# Patient Record
Sex: Female | Born: 1955 | Race: White | Hispanic: No | Smoking: Never smoker
Health system: Southern US, Community
[De-identification: ages and names within clinical notes are randomized; demographics above are authoritative.]

## PROBLEM LIST (undated history)

## (undated) DIAGNOSIS — R51 Headache: Secondary | ICD-10-CM

## (undated) DIAGNOSIS — Z780 Asymptomatic menopausal state: Secondary | ICD-10-CM

## (undated) DIAGNOSIS — Q2111 Secundum atrial septal defect: Secondary | ICD-10-CM

## (undated) DIAGNOSIS — E785 Hyperlipidemia, unspecified: Secondary | ICD-10-CM

## (undated) DIAGNOSIS — K219 Gastro-esophageal reflux disease without esophagitis: Secondary | ICD-10-CM

## (undated) DIAGNOSIS — F0781 Postconcussional syndrome: Secondary | ICD-10-CM

## (undated) DIAGNOSIS — Z8719 Personal history of other diseases of the digestive system: Secondary | ICD-10-CM

## (undated) DIAGNOSIS — E669 Obesity, unspecified: Secondary | ICD-10-CM

## (undated) DIAGNOSIS — N809 Endometriosis, unspecified: Secondary | ICD-10-CM

## (undated) DIAGNOSIS — I219 Acute myocardial infarction, unspecified: Secondary | ICD-10-CM

## (undated) DIAGNOSIS — M1990 Unspecified osteoarthritis, unspecified site: Secondary | ICD-10-CM

## (undated) DIAGNOSIS — Z8041 Family history of malignant neoplasm of ovary: Secondary | ICD-10-CM

## (undated) DIAGNOSIS — I82409 Acute embolism and thrombosis of unspecified deep veins of unspecified lower extremity: Secondary | ICD-10-CM

## (undated) DIAGNOSIS — K589 Irritable bowel syndrome without diarrhea: Secondary | ICD-10-CM

## (undated) DIAGNOSIS — M8580 Other specified disorders of bone density and structure, unspecified site: Secondary | ICD-10-CM

## (undated) DIAGNOSIS — F4310 Post-traumatic stress disorder, unspecified: Secondary | ICD-10-CM

## (undated) DIAGNOSIS — R7303 Prediabetes: Secondary | ICD-10-CM

## (undated) DIAGNOSIS — I519 Heart disease, unspecified: Secondary | ICD-10-CM

## (undated) DIAGNOSIS — I700 Atherosclerosis of aorta: Secondary | ICD-10-CM

## (undated) DIAGNOSIS — R519 Headache, unspecified: Secondary | ICD-10-CM

## (undated) DIAGNOSIS — N62 Hypertrophy of breast: Secondary | ICD-10-CM

## (undated) DIAGNOSIS — T8859XA Other complications of anesthesia, initial encounter: Secondary | ICD-10-CM

## (undated) DIAGNOSIS — Q2112 Patent foramen ovale: Secondary | ICD-10-CM

## (undated) DIAGNOSIS — G40409 Other generalized epilepsy and epileptic syndromes, not intractable, without status epilepticus: Secondary | ICD-10-CM

## (undated) DIAGNOSIS — Z800 Family history of malignant neoplasm of digestive organs: Secondary | ICD-10-CM

## (undated) DIAGNOSIS — G510 Bell's palsy: Secondary | ICD-10-CM

## (undated) DIAGNOSIS — G5711 Meralgia paresthetica, right lower limb: Secondary | ICD-10-CM

## (undated) DIAGNOSIS — R0600 Dyspnea, unspecified: Secondary | ICD-10-CM

## (undated) DIAGNOSIS — Z7952 Long term (current) use of systemic steroids: Secondary | ICD-10-CM

## (undated) DIAGNOSIS — M542 Cervicalgia: Secondary | ICD-10-CM

## (undated) DIAGNOSIS — Z9889 Other specified postprocedural states: Secondary | ICD-10-CM

## (undated) DIAGNOSIS — R112 Nausea with vomiting, unspecified: Secondary | ICD-10-CM

## (undated) DIAGNOSIS — D219 Benign neoplasm of connective and other soft tissue, unspecified: Secondary | ICD-10-CM

## (undated) DIAGNOSIS — I4719 Other supraventricular tachycardia: Secondary | ICD-10-CM

## (undated) DIAGNOSIS — M353 Polymyalgia rheumatica: Secondary | ICD-10-CM

## (undated) DIAGNOSIS — N8003 Adenomyosis of the uterus: Secondary | ICD-10-CM

## (undated) DIAGNOSIS — G479 Sleep disorder, unspecified: Secondary | ICD-10-CM

## (undated) DIAGNOSIS — R42 Dizziness and giddiness: Secondary | ICD-10-CM

## (undated) DIAGNOSIS — Q211 Atrial septal defect: Secondary | ICD-10-CM

## (undated) DIAGNOSIS — I5189 Other ill-defined heart diseases: Secondary | ICD-10-CM

## (undated) DIAGNOSIS — K222 Esophageal obstruction: Secondary | ICD-10-CM

## (undated) DIAGNOSIS — M48061 Spinal stenosis, lumbar region without neurogenic claudication: Secondary | ICD-10-CM

## (undated) DIAGNOSIS — A6000 Herpesviral infection of urogenital system, unspecified: Secondary | ICD-10-CM

## (undated) DIAGNOSIS — I209 Angina pectoris, unspecified: Secondary | ICD-10-CM

## (undated) DIAGNOSIS — J45909 Unspecified asthma, uncomplicated: Secondary | ICD-10-CM

## (undated) DIAGNOSIS — K209 Esophagitis, unspecified without bleeding: Secondary | ICD-10-CM

## (undated) DIAGNOSIS — F419 Anxiety disorder, unspecified: Secondary | ICD-10-CM

## (undated) DIAGNOSIS — I10 Essential (primary) hypertension: Secondary | ICD-10-CM

## (undated) DIAGNOSIS — N800 Endometriosis of uterus: Secondary | ICD-10-CM

## (undated) DIAGNOSIS — E559 Vitamin D deficiency, unspecified: Secondary | ICD-10-CM

## (undated) DIAGNOSIS — R0789 Other chest pain: Secondary | ICD-10-CM

## (undated) DIAGNOSIS — F410 Panic disorder [episodic paroxysmal anxiety] without agoraphobia: Secondary | ICD-10-CM

## (undated) DIAGNOSIS — S069X9A Unspecified intracranial injury with loss of consciousness of unspecified duration, initial encounter: Secondary | ICD-10-CM

## (undated) DIAGNOSIS — B009 Herpesviral infection, unspecified: Secondary | ICD-10-CM

## (undated) DIAGNOSIS — H8319 Labyrinthine fistula, unspecified ear: Secondary | ICD-10-CM

## (undated) DIAGNOSIS — M545 Low back pain, unspecified: Secondary | ICD-10-CM

## (undated) DIAGNOSIS — Z8701 Personal history of pneumonia (recurrent): Secondary | ICD-10-CM

## (undated) DIAGNOSIS — Z1379 Encounter for other screening for genetic and chromosomal anomalies: Secondary | ICD-10-CM

## (undated) DIAGNOSIS — O2230 Deep phlebothrombosis in pregnancy, unspecified trimester: Secondary | ICD-10-CM

## (undated) DIAGNOSIS — Z803 Family history of malignant neoplasm of breast: Secondary | ICD-10-CM

## (undated) DIAGNOSIS — M109 Gout, unspecified: Secondary | ICD-10-CM

## (undated) DIAGNOSIS — I499 Cardiac arrhythmia, unspecified: Secondary | ICD-10-CM

## (undated) HISTORY — DX: Low back pain: M54.5

## (undated) HISTORY — DX: Cervicalgia: M54.2

## (undated) HISTORY — DX: Obesity, unspecified: E66.9

## (undated) HISTORY — DX: Postconcussional syndrome: F07.81

## (undated) HISTORY — DX: Hyperlipidemia, unspecified: E78.5

## (undated) HISTORY — DX: Endometriosis of uterus: N80.0

## (undated) HISTORY — DX: Secundum atrial septal defect: Q21.11

## (undated) HISTORY — DX: Family history of malignant neoplasm of digestive organs: Z80.0

## (undated) HISTORY — DX: Endometriosis, unspecified: N80.9

## (undated) HISTORY — DX: Vitamin D deficiency, unspecified: E55.9

## (undated) HISTORY — DX: Bell's palsy: G51.0

## (undated) HISTORY — DX: Heart disease, unspecified: I51.9

## (undated) HISTORY — PX: OTHER SURGICAL HISTORY: SHX169

## (undated) HISTORY — DX: Labyrinthine fistula, unspecified ear: H83.19

## (undated) HISTORY — PX: INNER EAR SURGERY: SHX679

## (undated) HISTORY — DX: Herpesviral infection, unspecified: B00.9

## (undated) HISTORY — DX: Spinal stenosis, lumbar region without neurogenic claudication: M48.061

## (undated) HISTORY — DX: Adenomyosis of the uterus: N80.03

## (undated) HISTORY — DX: Encounter for other screening for genetic and chromosomal anomalies: Z13.79

## (undated) HISTORY — PX: SINOSCOPY: SHX187

## (undated) HISTORY — DX: Low back pain, unspecified: M54.50

## (undated) HISTORY — DX: Family history of malignant neoplasm of breast: Z80.3

## (undated) HISTORY — DX: Hypertrophy of breast: N62

## (undated) HISTORY — DX: Other generalized epilepsy and epileptic syndromes, not intractable, without status epilepticus: G40.409

## (undated) HISTORY — DX: Esophageal obstruction: K22.2

## (undated) HISTORY — PX: DILATION AND CURETTAGE OF UTERUS: SHX78

## (undated) HISTORY — PX: NASAL SEPTUM SURGERY: SHX37

## (undated) HISTORY — DX: Dizziness and giddiness: R42

## (undated) HISTORY — DX: Patent foramen ovale: Q21.12

## (undated) HISTORY — DX: Gastro-esophageal reflux disease without esophagitis: K21.9

## (undated) HISTORY — DX: Family history of malignant neoplasm of ovary: Z80.41

## (undated) HISTORY — DX: Unspecified asthma, uncomplicated: J45.909

## (undated) HISTORY — DX: Personal history of pneumonia (recurrent): Z87.01

## (undated) HISTORY — DX: Other chest pain: R07.89

## (undated) HISTORY — PX: ESOPHAGOGASTRODUODENOSCOPY: SHX1529

## (undated) HISTORY — DX: Atrial septal defect: Q21.1

## (undated) HISTORY — DX: Other specified disorders of bone density and structure, unspecified site: M85.80

## (undated) HISTORY — DX: Acute myocardial infarction, unspecified: I21.9

## (undated) HISTORY — DX: Benign neoplasm of connective and other soft tissue, unspecified: D21.9

## (undated) HISTORY — DX: Asymptomatic menopausal state: Z78.0

## (undated) HISTORY — DX: Meralgia paresthetica, right lower limb: G57.11

## (undated) HISTORY — DX: Unspecified intracranial injury with loss of consciousness of unspecified duration, initial encounter: S06.9X9A

---
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
## 1992-06-07 DIAGNOSIS — R42 Dizziness and giddiness: Secondary | ICD-10-CM

## 1992-06-07 DIAGNOSIS — S069X9A Unspecified intracranial injury with loss of consciousness of unspecified duration, initial encounter: Secondary | ICD-10-CM

## 1992-06-07 DIAGNOSIS — S069XAA Unspecified intracranial injury with loss of consciousness status unknown, initial encounter: Secondary | ICD-10-CM

## 1992-06-07 DIAGNOSIS — F0781 Postconcussional syndrome: Secondary | ICD-10-CM

## 1992-06-07 DIAGNOSIS — H8319 Labyrinthine fistula, unspecified ear: Secondary | ICD-10-CM

## 1992-06-07 HISTORY — DX: Unspecified intracranial injury with loss of consciousness of unspecified duration, initial encounter: S06.9X9A

## 1992-06-07 HISTORY — DX: Labyrinthine fistula, unspecified ear: H83.19

## 1992-06-07 HISTORY — DX: Postconcussional syndrome: R42

## 1992-06-07 HISTORY — DX: Postconcussional syndrome: F07.81

## 1992-06-07 HISTORY — DX: Unspecified intracranial injury with loss of consciousness status unknown, initial encounter: S06.9XAA

## 1998-06-07 DIAGNOSIS — Z1379 Encounter for other screening for genetic and chromosomal anomalies: Secondary | ICD-10-CM

## 1998-06-07 HISTORY — DX: Encounter for other screening for genetic and chromosomal anomalies: Z13.79

## 2000-06-07 DIAGNOSIS — G40409 Other generalized epilepsy and epileptic syndromes, not intractable, without status epilepticus: Secondary | ICD-10-CM

## 2000-06-07 HISTORY — DX: Other generalized epilepsy and epileptic syndromes, not intractable, without status epilepticus: G40.409

## 2006-08-06 HISTORY — PX: OTHER SURGICAL HISTORY: SHX169

## 2006-09-06 HISTORY — PX: PATENT FORAMEN OVALE CLOSURE: SHX2181

## 2007-06-08 HISTORY — PX: BREAST BIOPSY: SHX20

## 2007-10-12 ENCOUNTER — Encounter: Payer: Self-pay | Admitting: Cardiovascular Disease

## 2007-10-18 ENCOUNTER — Encounter: Payer: Self-pay | Admitting: Cardiovascular Disease

## 2008-04-15 ENCOUNTER — Encounter: Admission: RE | Admit: 2008-04-15 | Discharge: 2008-04-15 | Payer: Self-pay | Admitting: General Surgery

## 2008-07-23 ENCOUNTER — Encounter: Payer: Self-pay | Admitting: Cardiovascular Disease

## 2008-08-19 ENCOUNTER — Ambulatory Visit: Payer: Self-pay | Admitting: Internal Medicine

## 2008-09-30 ENCOUNTER — Encounter: Payer: Self-pay | Admitting: Cardiovascular Disease

## 2008-10-07 ENCOUNTER — Ambulatory Visit: Payer: Self-pay | Admitting: Surgery

## 2008-12-05 ENCOUNTER — Ambulatory Visit: Payer: Self-pay | Admitting: Internal Medicine

## 2008-12-24 ENCOUNTER — Ambulatory Visit: Payer: Self-pay | Admitting: Obstetrics and Gynecology

## 2009-02-26 ENCOUNTER — Ambulatory Visit: Payer: Self-pay | Admitting: Internal Medicine

## 2009-04-30 ENCOUNTER — Ambulatory Visit: Payer: Self-pay | Admitting: Gastroenterology

## 2009-05-07 ENCOUNTER — Ambulatory Visit: Payer: Self-pay | Admitting: Surgery

## 2009-05-29 ENCOUNTER — Ambulatory Visit: Payer: Self-pay | Admitting: Internal Medicine

## 2009-06-10 ENCOUNTER — Encounter: Payer: Self-pay | Admitting: Cardiovascular Disease

## 2009-06-11 ENCOUNTER — Encounter: Payer: Self-pay | Admitting: Cardiovascular Disease

## 2009-07-24 ENCOUNTER — Ambulatory Visit: Payer: Self-pay | Admitting: Internal Medicine

## 2009-07-27 ENCOUNTER — Encounter: Payer: Self-pay | Admitting: Cardiovascular Disease

## 2009-08-18 ENCOUNTER — Encounter: Payer: Self-pay | Admitting: Cardiovascular Disease

## 2009-11-05 ENCOUNTER — Ambulatory Visit: Payer: Self-pay | Admitting: Cardiovascular Disease

## 2009-11-05 DIAGNOSIS — E785 Hyperlipidemia, unspecified: Secondary | ICD-10-CM | POA: Insufficient documentation

## 2009-11-05 DIAGNOSIS — R079 Chest pain, unspecified: Secondary | ICD-10-CM | POA: Insufficient documentation

## 2009-11-05 DIAGNOSIS — I471 Supraventricular tachycardia: Secondary | ICD-10-CM | POA: Insufficient documentation

## 2009-11-05 DIAGNOSIS — Q211 Atrial septal defect: Secondary | ICD-10-CM | POA: Insufficient documentation

## 2009-11-11 ENCOUNTER — Telehealth: Payer: Self-pay | Admitting: Cardiovascular Disease

## 2010-02-16 ENCOUNTER — Observation Stay: Payer: Self-pay | Admitting: Internal Medicine

## 2010-02-16 ENCOUNTER — Ambulatory Visit: Payer: Self-pay | Admitting: Cardiovascular Disease

## 2010-02-25 ENCOUNTER — Ambulatory Visit: Payer: Self-pay | Admitting: Internal Medicine

## 2010-04-03 ENCOUNTER — Telehealth: Payer: Self-pay | Admitting: Cardiovascular Disease

## 2010-07-07 NOTE — Miscellaneous (Signed)
Summary: zocor  Clinical Lists Changes  Medications: Added new medication of ZOCOR 40 MG TABS (SIMVASTATIN) 1 tab by mouth at bedtime - Signed Rx of ZOCOR 40 MG TABS (SIMVASTATIN) 1 tab by mouth at bedtime;  #30 x 11;  Signed;  Entered by: Charlena Cross, RN, BSN;  Authorized by: Dossie Arbour MD;  Method used: [facility] [place], [place]  [address], Ph: [phone] or [phone], Fax: [phone]    Prescriptions: ZOCOR 40 MG TABS (SIMVASTATIN) 1 tab by mouth at bedtime  #30 x 11   Entered by:   Charlena Cross, RN, BSN   Authorized by:   Dossie Arbour MD   Signed by:   Charlena Cross, RN, BSN on 08/18/2009   Method used:   [facility]        [facility] (retail)       [address]       [place], [place]  [address]       Ph: [phone] or [phone]       Fax: [phone]   RxID:   [id number]

## 2010-07-07 NOTE — Assessment & Plan Note (Signed)
Summary: NP6   Visit Type:  Follow-up Primary Provider:  Duncan Dull, M.D.  CC:  shortness of breath.  History of Present Illness: Tracey Wiley is a pleasant 55 -year-old woman with a history of PFO closure with implant device in April 2008 performed in [place], hyperlipidemia, PAT versus paroxysmal atrial fibrillation on sotalol, hypertension who presents to establish care. She was last seen at [facility] January 2011  She states that she has been having continued episodes of tachypalpitations particularly in the evenings. She also has burning which he describes as a region the size of her face that burns underneath her left breast and typically occurs at nighttime. She has chronic pain from her neck that occurs both in the morning and at nighttime. She has to sleep on her left side do to her chronic neck pain.  Previously we tried to decrease her sotalol to 80 mg in the morning and 80 mg at night due to bradycardia during the daytime. Decreasing her evening dose she believes may have caused more tachycardia at nighttime.  Preventive Screening-Counseling & Management  Alcohol-Tobacco     Smoking Status: never      Drug Use:  no.    Current Medications (verified): 1)  Zocor 40 Mg Tabs (Simvastatin) .Marland Kitchen.. 1 Tab By Mouth At Bedtime 2)  Aciphex 20 Mg Tbec (Rabeprazole Sodium) .Marland Kitchen.. 1 Once Daily 3)  Xanax 0.5 Mg Tabs (Alprazolam) .Marland Kitchen.. 1 Tablet Qhs 4)  Sotalol Hcl 80 Mg Tabs (Sotalol Hcl) .... Take 1 Tablet By Mouth Two Times A Day 5)  Triamterene-Hctz 37.5-25 Mg Caps (Triamterene-Hctz) .Marland Kitchen.. 1 Tablet Q D 6)  Acetaminophen 500 Mg  Caps (Acetaminophen) 7)  Advil 200 Mg Tabs (Ibuprofen) .... As Needed 8)  Excedrin Pm 500-38 Mg Tabs (Diphenhydramine-Apap (Sleep))  Allergies (verified): 1)  ! Codeine 2)  ! Biaxin 3)  ! Morphine 4)  ! Vicodin 5)  ! Macrobid  Past History:  Family History: Last updated: 11/05/2009 CHF Family History of Cancer:  Family History  of Coronary Artery Disease:  Family History of Diabetes:  Family History of Hyperlipidemia:  Family History of Hypertension:   Social History: Last updated: 11/05/2009 Disabled  Married  Tobacco Use - No.  Alcohol Use - no Drug Use - no  Risk Factors: Smoking Status: never (11/05/2009)  Past Surgical History: TMJ x 2 PFO closure inner ear surg. x 4 Nasal Deviated Septum  Family History: CHF Family History of Cancer:  Family History of Coronary Artery Disease:  Family History of Diabetes:  Family History of Hyperlipidemia:  Family History of Hypertension:   Social History: Disabled  Married  Tobacco Use - No.  Alcohol Use - no Drug Use - no Smoking Status:  never Drug Use:  no  Review of Systems       The patient complains of weight gain.  The patient denies fever, weight loss, vision loss, decreased hearing, hoarseness, chest pain, syncope, dyspnea on exertion, peripheral edema, prolonged cough, abdominal pain, incontinence, muscle weakness, depression, and enlarged lymph nodes.         Tachycardia, neck pain, chest burning  New Orders:     1)  EKG w/ Interpretation (93000)   Vital Signs:  Patient profile:   55 year old female Height:      65 inches Weight:      206 pounds BMI:     34.40 BP sitting:   153 / 97  (right arm) Cuff size:   regular  Vitals  Entered By: Bishop Dublin, CMA (November 05, 2009 3:11 PM)  Physical Exam  General:  Well developed, well nourished, in no acute distress. Head:  normocephalic and atraumatic Neck:  Neck supple, no JVD. No masses, thyromegaly or abnormal cervical nodes. Lungs:  Clear bilaterally to auscultation and percussion. Heart:  Non-displaced PMI, chest non-tender; regular rate and rhythm, S1, S2 without murmurs, rubs or gallops. Carotid upstroke normal, no bruit.  Pedals normal pulses. No edema, no varicosities. Abdomen:  Bowel sounds positive; abdomen soft and non-tender without masses, organomegaly, or hernias  noted. No hepatosplenomegaly. Msk:  Back normal, normal gait. Muscle strength and tone normal. Pulses:  pulses normal in all 4 extremities Extremities:  No clubbing or cyanosis. Neurologic:  Alert and oriented x 3. Skin:  Intact without lesions or rashes. Psych:  Normal affect.   EKG  Procedure date:  11/05/2009  Findings:      sinus bradycardia with rate 57 beats per minute, borderline T waves in the 2 through V4 otherwise poor progression through the precordial leads.  Impression & Recommendations:  Problem # 1:  SVT/ PSVT/ PAT (ICD-427.0) history of PAT, possible paroxysmal atrial  fibrillation. Workup completed in [place]. She has been managed on sotalol. We have recommended that she go back to 120 mg in the evening with a lower dose and he morning as she has few symptoms of palpitations and daytime.  Her updated medication list for this problem includes:    Sotalol Hcl 80 Mg Tabs (Sotalol hcl) .Marland Kitchen... Take 1/2 tab at 9 am and 1 1/2 tab at 9 pm and 1/2 tab as needed  Problem # 2:  CHEST PAIN-UNSPECIFIED (ICD-786.50) Etiology of her chest burning is uncertain. It may be due to reflux. She did have an EGD showing gastritis with hiatal hernia. It is very atypical for cardiac has a cut on a nighttime at rest and is not reproducible.  Her updated medication list for this problem includes:    Sotalol Hcl 80 Mg Tabs (Sotalol hcl) .Marland Kitchen... Take 1/2 tab at 9 am and 1 1/2 tab at 9 pm and 1/2 tab as needed  Problem # 3:  HYPERLIPIDEMIA-MIXED (ICD-272.4) Her most recent lab work from January 2011 shows total cholesterol 172, LDL 90, HDL 49, hemoglobin A1c 6.0, TSH 2.44 with normal LFTs  Her updated medication list for this problem includes:    Zocor 40 Mg Tabs (Simvastatin) .Marland Kitchen... 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 No repeat echocardiogram indicated at this time. If she continues to have symptoms of tach palpitations, we  could perform an echocardiogram to confirm the location of her PFO closure device.  Other Orders: EKG w/ Interpretation (93000)  Patient Instructions: 1)  Your physician has recommended you make the following change in your medication: 1. Sotalol 40mg  at 9 am and 120mg  at 9pm and 40mg  as needed.  2)  Your physician wants you to follow-up in:   6 MONTHS You will receive a reminder letter in the mail two months in advance. If you don't receive a letter, please call our office to schedule the follow-up appointment.

## 2010-07-07 NOTE — Letter (Signed)
Summary: PHI  PHI   Imported By: Harlon Flor 11/06/2009 10:17:24  _____________________________________________________________________  External Attachment:    Type:   Image     Comment:   External Document

## 2010-07-07 NOTE — Letter (Signed)
Summary: [facility]  [facility]   Imported By: Harlon Flor 11/11/2009 15:22:56  _____________________________________________________________________  External Attachment:    Type:   Image     Comment:   External Document

## 2010-07-07 NOTE — Progress Notes (Signed)
Summary: LAB  Phone Note Call from Patient Call back at Home Phone [phone]   Caller: SELF Call For: Tracey Wiley Summary of Call: WAITING TO HEAR BACK ABOUT CHOLESTEROL LABS THAT DR Darrick Huntsman DREW-WAS TOLD BY Mariah Milling THAT THEY WOULD HEAR FROM OUR OFFICE Initial call taken by: Harlon Flor,  November 11, 2009 3:16 PM  Follow-up for Phone Call        called pt informed them that the results were not available yet but i would contact Dr. Melina Schools office again.  Follow-up by: Benedict Needy, RN,  November 11, 2009 4:42 PM

## 2010-07-07 NOTE — Progress Notes (Signed)
Summary: [facility]  Phone Note Call from Patient Call back at Home Phone [phone]   Caller: HUSBAND Call For: [facility] Summary of Call: PT NEEDS PRE MED ANTIBIOTICS FOR DENTIST APPT [facility]06/[facility] Initial call taken by: Harlon Flor,  April 03, 2010 9:55 AM  Follow-up for Phone Call        OK to give amoxicillin 2000 mg by mouth 1 hr before procedure     Appended Document: [facility]    Clinical Lists Changes  Medications: Added new medication of AMOXICILLIN 500 MG CAPS (AMOXICILLIN) take four tablets one hour prior to dental work - Signed Rx of AMOXICILLIN 500 MG CAPS (AMOXICILLIN) take four tablets one hour prior to dental work;  #4 x 0;  Signed;  Entered by: Bishop Dublin, CMA;  Authorized by: Dossie Arbour MD;  Method used: [facility]  [address], [place], [place]  [address], Ph: [phone] or [phone], Fax: [phone]    Prescriptions: AMOXICILLIN 500 MG CAPS (AMOXICILLIN) take four tablets one hour prior to dental work  #4 x 0   Entered by:   Bishop Dublin, CMA   Authorized by:   Dossie Arbour MD   Signed by:   Bishop Dublin, CMA on 04/03/2010   Method used:   [facility]ronically [facility]        [facility]  [address] (retail)       [address]       [place], [place]  [address]       Ph: [phone] or [phone]       Fax: [phone]   RxID:   [id number]    Appended Document: [facility] notified patient's husband [facility] [place].

## 2010-07-07 NOTE — Letter (Signed)
Summary: Medical Record Release  Medical Record Release   Imported By: Harlon Flor 10/23/2009 11:51:19  _____________________________________________________________________  External Attachment:    Type:   Image     Comment:   External Document

## 2010-07-07 NOTE — Letter (Signed)
Summary: [facility]  [facility]   Imported By: Harlon Flor 11/11/2009 15:22:18  _____________________________________________________________________  External Attachment:    Type:   Image     Comment:   External Document

## 2010-07-07 NOTE — Letter (Signed)
Summary: [facility]  [facility]   Imported By: Harlon Flor 11/06/2009 10:17:01  _____________________________________________________________________  External Attachment:    Type:   Image     Comment:   External Document

## 2010-07-30 ENCOUNTER — Ambulatory Visit (INDEPENDENT_AMBULATORY_CARE_PROVIDER_SITE_OTHER): Payer: BC Managed Care – PPO | Admitting: Cardiovascular Disease

## 2010-07-30 ENCOUNTER — Encounter: Payer: Self-pay | Admitting: Cardiovascular Disease

## 2010-07-30 VITALS — BP 100/64 | HR 60 | Resp 16 | Ht 65.0 in | Wt 191.0 lb

## 2010-07-30 DIAGNOSIS — I4719 Other supraventricular tachycardia: Secondary | ICD-10-CM

## 2010-07-30 DIAGNOSIS — I471 Supraventricular tachycardia, unspecified: Secondary | ICD-10-CM

## 2010-07-30 DIAGNOSIS — Q2111 Secundum atrial septal defect: Secondary | ICD-10-CM

## 2010-07-30 DIAGNOSIS — I4891 Unspecified atrial fibrillation: Secondary | ICD-10-CM

## 2010-07-30 DIAGNOSIS — Q2112 Patent foramen ovale: Secondary | ICD-10-CM

## 2010-07-30 DIAGNOSIS — R000 Tachycardia, unspecified: Secondary | ICD-10-CM

## 2010-07-30 DIAGNOSIS — E785 Hyperlipidemia, unspecified: Secondary | ICD-10-CM

## 2010-07-30 DIAGNOSIS — Q211 Atrial septal defect: Secondary | ICD-10-CM

## 2010-07-30 DIAGNOSIS — R002 Palpitations: Secondary | ICD-10-CM

## 2010-08-01 ENCOUNTER — Encounter: Payer: Self-pay | Admitting: Cardiovascular Disease

## 2010-08-01 NOTE — Assessment & Plan Note (Signed)
very occasional episodes of tachypalpitations in the late afternoon.  I suggested she continue her full dose sotalol at noon and midnight with a 40 mg tab for breakthrough tachypalpitations.

## 2010-08-01 NOTE — Assessment & Plan Note (Signed)
PFO closure placed 4 years ago and has been doing well. Echocardiogram in 2009 showed negative saline contrast bubble study.

## 2010-08-01 NOTE — Assessment & Plan Note (Signed)
continue Zocor. Cholesterol last year showed LDL of 90. This will likely improve with her recent weight loss. We have encouraged continued exercise and diet control as she is doing.

## 2010-08-01 NOTE — Progress Notes (Signed)
Subjective:      Patient ID: Tracey Wiley is a 55 y.o. female.  Chief Complaint: HPI Comments: Tracey Wiley has a history of PFO with  closure  device in April 2008 performed in [place], hyperlipidemia, PAT versus paroxysmal atrial fibrillation on sotalol, hypertension who presents for routine followup.  she is recovering from an upper respiratory infection, possibly pneumonia. She is taking Augmentin and is feeling better. She continues to have significant cough. She has been exercising on a regular basis with a 15 pound weight loss over the past 7 months. Her husband has had a 25 pound weight loss. She has also been watching her diet and feels well. She does have some tachycardia palpitations at nighttime and is concerned about atrial fibrillation. She does better on sotalol 80 mg in the late morning and at midnight.  EKG shows normal sinus rhythm with rate 60 beats per minute, no significant ST or T wave changes  .  Allergies  Allergen Reactions  . Clarithromycin   . Codeine   . Hydrocodone-Acetaminophen   . Morphine   . Nitrofurantoin     Prior to Admission medications   Medication Sig Start Date End Date Taking? Authorizing Provider  ALPRAZolam Prudy Feeler) 0.5 MG tablet Take 0.5 mg by mouth at bedtime as needed.     Yes Historical Provider, MD  aspirin-acetaminophen-caffeine (EXCEDRIN MIGRAINE) [phone] MG per tablet Take 1 tablet by mouth every 6 (six) hours as needed.     Yes Historical Provider, MD  RABEprazole (ACIPHEX) 20 MG tablet Take 20 mg by mouth daily.     Yes Historical Provider, MD  simvastatin (ZOCOR) 40 MG tablet Take 40 mg by mouth at bedtime.     Yes Historical Provider, MD  SOTALOL AF 80 MG TABS Take 80 mg by mouth 2 (two) times daily.     Yes Historical Provider, MD  triamterene-hydrochlorothiazide (DYAZIDE) 37.5-25 MG per capsule Take 1 capsule by mouth every morning.     Yes Historical Provider, MD    Past Medical History  Diagnosis Date  . PFO (patent  foramen ovale)   . Hyperlipidemia   . Obesity   . Chest pain, atypical     egd showing gastritis and hiatal hernia    Past Surgical History  Procedure Date  . Patent foramen ovale closure April 2008    Social History  reports that she has never smoked. She does not have any smokeless tobacco history on file.  Family History family history is not on file.     Review of Systems  Constitution: Negative for night sweats.  HENT: Negative.   Eyes: Negative.   Cardiovascular: Negative for claudication, cyanosis, dyspnea on exertion, leg swelling, orthopnea and paroxysmal nocturnal dyspnia.  Respiratory: Positive for sputum production. Negative for sleep disturbances due to breathing and wheezing.   Endocrine: Negative.   Hematologic/Lymphatic: Negative.   Skin: Negative.   Musculoskeletal: Negative for falls, joint pain, joint swelling and myalgias.  Gastrointestinal: Negative.   Neurological: Negative for difficulty with concentration, excessive daytime sleepiness, focal weakness and light-headedness.  Psychiatric/Behavioral: Negative.    BP 100/64  Pulse 60  Resp 16  Ht 5\' 5"  (1.651 m)  Wt 191 lb (86.637 kg)  BMI 31.78 kg/m2   Objective:    Physical Exam  Constitutional: She appears healthy. No distress.  HENT:  Nose: Nose normal.  Mouth/Throat: Oropharynx is clear.  Eyes: Conjunctivae are normal. Pupils are equal, round, and reactive to light.  Neck: Normal range of  motion. Neck supple. No JVD present.  Cardiovascular: Normal rate, regular rhythm, S1 normal, S2 normal, normal heart sounds, intact distal pulses and normal pulses.   No extrasystoles are present. Exam reveals no gallop.   No murmur heard. Pulses:      Carotid pulses are 2+ on the right side, and 2+ on the left side.      Radial pulses are 2+ on the right side, and 2+ on the left side.       Posterior tibial pulses are 2+ on the right side, and 2+ on the left side.  Pulmonary/Chest: Effort normal and  breath sounds normal. She has no wheezes. She has no rales. She exhibits no tenderness.  Abdominal: Soft. Bowel sounds are normal. She exhibits no distension. There is no tenderness.  Musculoskeletal: Normal range of motion. She exhibits no edema and no tenderness.  Neurological: She is alert and oriented to person, place, and time. She has normal motor skills. Gait normal.  Skin: Skin is warm and dry. No rash noted.    Assessment:     No diagnosis found.   Plan:

## 2010-08-04 NOTE — Assessment & Plan Note (Signed)
Summary: ROV/# [id number]   Visit Type:  Follow-up Primary Provider:  Duncan Dull, M.D.  CC:  c/o just getting over puemonia.  She has palpitations at times with occas. shortness of breath..  History of Present Illness: Tracey Wiley is a pleasant 55 -year-old woman with a history of PFO closure with implant device in April 2008 performed in [place], hyperlipidemia, PAT versus paroxysmal atrial fibrillation on sotalol, hypertension who presents for routine followup.  she is recovering from an upper respiratory infection, possibly pneumonia. She is taking Augmentin and is feeling better. She continues to have significant cough. She has been exercising on a regular basis with a 15 pound weight loss over the past 7 months. Her husband has had a 25 pound weight loss. She has also been watching her diet and feels well. She does have some tachycardia palpitations at nighttime and is concerned about atrial fibrillation. She does better on sotalol 80 mg in the late morning and at midnight.  EKG shows normal sinus rhythm with rate 60 beats per minute, no significant ST or T wave changes  Current Medications (verified): 1)  Zocor 40 Mg Tabs (Simvastatin) .Marland Kitchen.. 1 Tab By Mouth At Bedtime 2)  Aciphex 20 Mg Tbec (Rabeprazole Sodium) .Marland Kitchen.. 1 Once Daily 3)  Xanax 0.5 Mg Tabs (Alprazolam) .Marland Kitchen.. 1 Tablet Qhs 4)  Sotalol Hcl 80 Mg Tabs (Sotalol Hcl) .... Take 1/2 Tab At 29 Am and 1 1/2 Tab At 9 Pm and 1/2 Tab As Needed 5)  Triamterene-Hctz 37.5-25 Mg Caps (Triamterene-Hctz) .Marland Kitchen.. 1 Tablet Q D 6)  Acetaminophen 500 Mg  Caps (Acetaminophen) 7)  Advil 200 Mg Tabs (Ibuprofen) .... As Needed 8)  Excedrin Pm 500-38 Mg Tabs (Diphenhydramine-Apap (Sleep)) 9)  Amoxicillin 500 Mg Caps (Amoxicillin) .... Take Four Tablets One Hour Prior To Dental Work 10)  Tessalon Perles 100 Mg Caps (Benzonatate) .... Takes 3 Daily For Cough 11)  Augmentin 500-125 Mg Tabs (Amoxicillin-Pot Clavulanate) .... Take Two Tabs Daily For  Seven Days For Pneumonia.  Allergies (verified): 1)  ! Codeine 2)  ! Biaxin 3)  ! Morphine 4)  ! Vicodin 5)  ! Macrobid  Review of Systems  The patient denies fever, weight loss, weight gain, vision loss, decreased hearing, hoarseness, chest pain, syncope, dyspnea on exertion, peripheral edema, prolonged cough, abdominal pain, incontinence, muscle weakness, depression, and enlarged lymph nodes.         cough, sputum  Vital Signs:  Patient profile:   55 year old female Height:      65 inches Weight:      191 pounds Pulse rate:   60 / minute BP sitting:   100 / 64  Vitals Entered By: Bishop Dublin, CMA (July 30, 2010 3:26 PM)  Physical Exam  General:  Well developed, well nourished, in no acute distress. mild obesity Head:  normocephalic and atraumatic Neck:  Neck supple, no JVD. No masses, thyromegaly or abnormal cervical nodes. Lungs:  Clear bilaterally to auscultation and percussion. Heart:  Non-displaced PMI, chest non-tender; regular rate and rhythm, S1, S2 without murmurs, rubs or gallops. Carotid upstroke normal, no bruit.  Pedals normal pulses. No edema, no varicosities. Abdomen:  Bowel sounds positive; abdomen soft and non-tender without masses Msk:  Back normal, normal gait. Muscle strength and tone normal. Pulses:  pulses normal in all 4 extremities Extremities:  No clubbing or cyanosis. Neurologic:  Alert and oriented x 3. Skin:  Intact without lesions or rashes. Psych:  Normal affect.   Impression &  Recommendations:  Problem # 1:  SVT/ PSVT/ PAT (ICD-427.0) very occasional episodes of tachypalpitations in the late afternoon. I suggested she continue her full dose sotalol at noon and midnight with a 40 mg tab for breakthrough tachypalpitations.  Her updated medication list for this problem includes:    Sotalol Hcl 80 Mg Tabs (Sotalol hcl) .Marland Kitchen... 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 Echocardiogram in 2009 showed negative saline contrast bubble study.  Problem # 3:  HYPERLIPIDEMIA-MIXED (ICD-272.4) continue Zocor. Cholesterol last year showed LDL of 90. This will likely improve with her recent weight loss. We have encouraged continued exercise and diet control as she is doing.  Her updated medication list for this problem includes:    Zocor 40 Mg Tabs (Simvastatin) .Marland Kitchen... 1 tab by mouth at bedtime  Orders: T-Lipid Profile ([id number]) T-Hepatic Function [phone])  Patient Instructions: 1)  Your physician recommends that you schedule a follow-up appointment in: 1 year. 2)  Your physician recommends that you continue on your current medications as directed. Please refer to the Current Medication list given to you today. 3)  Your physician recommends that you return for a FASTING lipid profile: [facility] order sheet given.

## 2010-08-26 ENCOUNTER — Ambulatory Visit: Payer: Self-pay | Admitting: Internal Medicine

## 2010-09-14 ENCOUNTER — Ambulatory Visit: Payer: Self-pay | Admitting: Unknown Physician Specialty

## 2010-09-18 ENCOUNTER — Other Ambulatory Visit: Payer: Self-pay | Admitting: Internal Medicine

## 2010-09-23 ENCOUNTER — Telehealth: Payer: Self-pay | Admitting: Emergency Medicine

## 2010-09-23 NOTE — Telephone Encounter (Signed)
Dr. Melina Schools office called wanting to know if patient is clear to start exercise program with a personal trainer from a cardiac standpoint.

## 2010-09-23 NOTE — Telephone Encounter (Signed)
Yes, ok to start exercise with trainer

## 2010-09-24 NOTE — Telephone Encounter (Signed)
Morrie Sheldon notified at [facility] office

## 2010-10-30 ENCOUNTER — Ambulatory Visit: Payer: Self-pay | Admitting: Internal Medicine

## 2010-11-25 ENCOUNTER — Ambulatory Visit: Payer: Self-pay | Admitting: Internal Medicine

## 2010-12-22 ENCOUNTER — Encounter: Payer: Self-pay | Admitting: Cardiovascular Disease

## 2011-01-11 ENCOUNTER — Telehealth: Payer: Self-pay

## 2011-01-11 MED ORDER — TRIAMTERENE-HCTZ 37.5-25 MG PO CAPS: 1.0000 | ORAL_CAPSULE | ORAL | Status: DC

## 2011-01-11 NOTE — Telephone Encounter (Signed)
Needs a refill on Triamterene-HCTZ 37.5-25mg  take one tablet daily.

## 2011-02-18 ENCOUNTER — Telehealth: Payer: Self-pay

## 2011-02-18 MED ORDER — SOTALOL HCL 80 MG PO TABS: ORAL_TABLET | ORAL | Status: DC

## 2011-02-18 NOTE — Telephone Encounter (Signed)
Refill sent for sotalol 80 mg tablet.

## 2011-02-22 ENCOUNTER — Other Ambulatory Visit: Payer: Self-pay | Admitting: Internal Medicine

## 2011-02-22 MED ORDER — VALACYCLOVIR HCL 1 G PO TABS: 2000.0000 mg | ORAL_TABLET | Freq: Three times a day (TID) | ORAL | Status: DC | PRN

## 2011-03-31 ENCOUNTER — Ambulatory Visit: Payer: BC Managed Care – PPO | Admitting: Cardiovascular Disease

## 2011-04-02 ENCOUNTER — Encounter: Payer: Self-pay | Admitting: Cardiovascular Disease

## 2011-04-02 ENCOUNTER — Ambulatory Visit (INDEPENDENT_AMBULATORY_CARE_PROVIDER_SITE_OTHER): Payer: BC Managed Care – PPO | Admitting: Cardiovascular Disease

## 2011-04-02 DIAGNOSIS — R079 Chest pain, unspecified: Secondary | ICD-10-CM

## 2011-04-02 DIAGNOSIS — R0789 Other chest pain: Secondary | ICD-10-CM

## 2011-04-02 DIAGNOSIS — R002 Palpitations: Secondary | ICD-10-CM

## 2011-04-02 DIAGNOSIS — R0602 Shortness of breath: Secondary | ICD-10-CM

## 2011-04-02 DIAGNOSIS — E785 Hyperlipidemia, unspecified: Secondary | ICD-10-CM

## 2011-04-02 DIAGNOSIS — I471 Supraventricular tachycardia: Secondary | ICD-10-CM

## 2011-04-02 NOTE — Assessment & Plan Note (Signed)
I suggested she stay on her simvastatin

## 2011-04-02 NOTE — Progress Notes (Signed)
Subjective:      Patient ID: Tracey Wiley is a 55 y.o. female.  Chief Complaint: HPI Comments: Tracey Wiley has a history of PFO with  closure  device in April 2008 performed in [place], hyperlipidemia, PAT versus paroxysmal atrial fibrillation on sotalol, hypertension who presents for routine followup.  She has been taking sotalol 80 mg b.i.d.. She does notice when she takes 80 mg at a time, she does have bradycardia and possible fatigue. She has numerous symptoms on today's visit including nausea, shortness of breath, chest heaviness, numbness in her fingers. She is very active, recently went biking for 8 miles, has gone bowling recently and does other activities with a Systems analyst. She was given tramadol for symptoms of concussion but has not tried this yet and is taking Advil. She thinks the Advil is causing her nausea. She hit her head in a pool and since then has not felt well.  She also reports having difficulty with her husband and is now somewhat separated and living in a separate house. He spent significant time talking about the dynamic. It would seem that he has some behavior issues including anger management issues. She reports that she is sad and disappointed by this but was not able to handle it very well as it has been persistent.  EKG shows normal sinus rhythm with rate 52 beats per minute, no significant ST or T wave changes  .  Outpatient Encounter Prescriptions as of 04/02/2011  Medication Sig Dispense Refill  . acetaminophen (TYLENOL) 500 MG tablet Take 500 mg by mouth daily.        Marland Kitchen ALPRAZolam (XANAX) 0.5 MG tablet Take 0.5 mg by mouth at bedtime as needed.        Marland Kitchen amoxicillin (AMOXIL) 500 MG capsule Take 2,000 mg by mouth. 1 hour prior to dental work       . aspirin-acetaminophen-caffeine (EXCEDRIN MIGRAINE) 250-250-65 MG per tablet Take 1 tablet by mouth every 6 (six) hours as needed.        . Diphenhydramine-APAP, sleep, (EXCEDRIN PM) 38-500 MG TABS Take by  mouth.        Marland Kitchen ibuprofen (ADVIL,MOTRIN) 200 MG tablet Take 200 mg by mouth as needed.        . RABEprazole (ACIPHEX) 20 MG tablet Take 20 mg by mouth daily.        . simvastatin (ZOCOR) 40 MG tablet Take 40 mg by mouth at bedtime.        . sotalol (BETAPACE) 80 MG tablet Take 1/2 tab at 9 a.m. And 1 and 1/2 tabs at 9 p.m. And 1/2 tab as needed  225 tablet  6  . SOTALOL AF 80 MG TABS Take 80 mg by mouth 2 (two) times daily.        Marland Kitchen triamterene-hydrochlorothiazide (DYAZIDE) 37.5-25 MG per capsule Take 1 capsule by mouth every morning.  90 capsule  3  . DISCONTD: amoxicillin-clavulanate (AUGMENTIN) 500-125 MG per tablet Take 2 tablets by mouth 2 (two) times daily. For 7 days for pneumonia       . DISCONTD: benzonatate (TESSALON) 100 MG capsule Take 100 mg by mouth 3 (three) times daily as needed.           Review of Systems  Unable to perform ROS Constitution: Negative for night sweats.  HENT: Negative.   Eyes: Negative.   Cardiovascular: Positive for dyspnea on exertion. Negative for claudication, cyanosis, leg swelling, orthopnea and paroxysmal nocturnal dyspnia.  Chest heaviness  Respiratory: Negative for sleep disturbances due to breathing, sputum production and wheezing.   Endocrine: Negative.   Hematologic/Lymphatic: Negative.   Skin: Negative.   Musculoskeletal: Negative for falls, joint pain, joint swelling and myalgias.  Gastrointestinal: Positive for nausea.  Neurological: Negative for difficulty with concentration, excessive daytime sleepiness, focal weakness and light-headedness.  Psychiatric/Behavioral: Negative.      Objective:    Physical Exam  Constitutional: She appears well-developed and well-nourished. She appears healthy. No distress.  HENT:  Head: Normocephalic.  Nose: Nose normal.  Mouth/Throat: Oropharynx is clear and moist. Oropharynx is clear.  Eyes: Conjunctivae are normal. Pupils are equal, round, and reactive to light.  Neck: Normal range of  motion. Neck supple. No JVD present.  Cardiovascular: Normal rate, regular rhythm, S1 normal, S2 normal, normal heart sounds, intact distal pulses and normal pulses.   No extrasystoles are present. Exam reveals no gallop and no friction rub.   No murmur heard. Pulses:      Carotid pulses are 2+ on the right side, and 2+ on the left side.      Radial pulses are 2+ on the right side, and 2+ on the left side.       Posterior tibial pulses are 2+ on the right side, and 2+ on the left side.  Pulmonary/Chest: Effort normal and breath sounds normal. No respiratory distress. She has no wheezes. She has no rales. She exhibits no tenderness.  Abdominal: Soft. Bowel sounds are normal. She exhibits no distension. There is no tenderness.  Musculoskeletal: Normal range of motion. She exhibits no edema and no tenderness.  Lymphadenopathy:    She has no cervical adenopathy.  Neurological: She is alert and oriented to person, place, and time. She has normal motor skills. Coordination and gait normal.  Skin: Skin is warm and dry. No rash noted. No erythema.  Psychiatric: She has a normal mood and affect. Her behavior is normal. Judgment and thought content normal.     Review of Systems  Unable to perform ROS Constitutional: Negative for night sweats.  HENT: Negative.   Eyes: Negative.   Respiratory: Negative for sputum production, wheezing and sleep disturbances due to breathing.   Cardiovascular: Positive for dyspnea on exertion. Negative for orthopnea, claudication, leg swelling, PND and cyanosis.       Chest heaviness  Gastrointestinal: Positive for nausea.  Musculoskeletal: Negative for myalgias, joint pain, falls and joint swelling.  Skin: Negative.   Neurological: Negative for focal weakness, light-headedness, difficulty with concentration and excessive daytime sleepiness.  Psychiatric/Behavioral: Negative.      Physical Exam  Constitutional: She is oriented to person, place, and time. She  appears well-developed and well-nourished. She appears healthy. No distress.  HENT:  Head: Normocephalic.  Nose: Nose normal.  Mouth/Throat: Oropharynx is clear and moist. Oropharynx is clear.  Eyes: Conjunctivae are normal. Pupils are equal, round, and reactive to light.  Neck: Normal range of motion. Neck supple. No JVD present.  Cardiovascular: Normal rate, regular rhythm, S1 normal, S2 normal, normal heart sounds, intact distal pulses and normal pulses.   No extrasystoles are present. Exam reveals no gallop and no friction rub.   No murmur heard. Pulses:      Carotid pulses are 2+ on the right side, and 2+ on the left side.      Radial pulses are 2+ on the right side, and 2+ on the left side.       Posterior tibial pulses are 2+ on the right  side, and 2+ on the left side.  Pulmonary/Chest: Effort normal and breath sounds normal. No respiratory distress. She has no wheezes. She has no rales. She exhibits no tenderness.  Abdominal: Soft. Bowel sounds are normal. She exhibits no distension. There is no tenderness.  Musculoskeletal: Normal range of motion. She exhibits no edema and no tenderness.  Lymphadenopathy:    She has no cervical adenopathy.  Neurological: She is alert and oriented to person, place, and time. She has normal motor skills. Coordination and gait normal.  Skin: Skin is warm and dry. No rash noted. No erythema.  Psychiatric: She has a normal mood and affect. Her behavior is normal. Judgment and thought content normal.

## 2011-04-02 NOTE — Assessment & Plan Note (Signed)
Arrhythmia seems to be controlled by sotalol. I would agree with Dr. Darrick Huntsman in that she could try sotalol q.i.d., possibly t.i.d..

## 2011-04-02 NOTE — Assessment & Plan Note (Signed)
Atypical type chest pain. She is very active at baseline with no symptoms with exertion. No further workup at this time. I suspect much of her symptoms is from underlying stress and anxiety secondary to the relationship with her husband.

## 2011-04-02 NOTE — Patient Instructions (Signed)
You are doing well. No medication changes were made.  Please call us if you have new issues that need to be addressed before your next appt.  The office will contact you for a follow up Appt. In 6 months  

## 2011-05-05 ENCOUNTER — Ambulatory Visit: Payer: Self-pay | Admitting: Obstetrics and Gynecology

## 2011-05-08 LAB — HM COLONOSCOPY: HM Colonoscopy: NORMAL

## 2011-05-10 ENCOUNTER — Other Ambulatory Visit: Payer: Self-pay | Admitting: Internal Medicine

## 2011-05-10 NOTE — Telephone Encounter (Signed)
Ok to refill the short acting and long acting alprazolam for Tracey Wiley.

## 2011-05-11 MED ORDER — ALPRAZOLAM 0.5 MG PO TABS: 0.5000 mg | ORAL_TABLET | Freq: Every evening | ORAL | Status: DC | PRN

## 2011-05-11 MED ORDER — ALPRAZOLAM ER 0.5 MG PO TB24: 0.5000 mg | ORAL_TABLET | ORAL | Status: AC

## 2011-05-12 ENCOUNTER — Ambulatory Visit (INDEPENDENT_AMBULATORY_CARE_PROVIDER_SITE_OTHER): Payer: BC Managed Care – PPO | Admitting: Internal Medicine

## 2011-05-12 DIAGNOSIS — Z23 Encounter for immunization: Secondary | ICD-10-CM

## 2011-05-14 NOTE — Telephone Encounter (Signed)
Both Rx have already been called in.

## 2011-05-14 NOTE — Telephone Encounter (Signed)
Please call in refils on the alprazolam prescriptiosn if not already done per prior note.  May not have gotten routed correctly

## 2011-05-31 DIAGNOSIS — Z0279 Encounter for issue of other medical certificate: Secondary | ICD-10-CM

## 2011-06-04 ENCOUNTER — Encounter: Payer: Self-pay | Admitting: Internal Medicine

## 2011-06-04 DIAGNOSIS — K319 Disease of stomach and duodenum, unspecified: Secondary | ICD-10-CM | POA: Insufficient documentation

## 2011-06-16 DIAGNOSIS — F429 Obsessive-compulsive disorder, unspecified: Secondary | ICD-10-CM | POA: Diagnosis not present

## 2011-06-21 ENCOUNTER — Other Ambulatory Visit: Payer: Self-pay | Admitting: *Deleted

## 2011-06-21 MED ORDER — RABEPRAZOLE SODIUM 20 MG PO TBEC: 20.0000 mg | DELAYED_RELEASE_TABLET | Freq: Every day | ORAL | Status: DC

## 2011-06-23 DIAGNOSIS — F429 Obsessive-compulsive disorder, unspecified: Secondary | ICD-10-CM | POA: Diagnosis not present

## 2011-06-30 DIAGNOSIS — F429 Obsessive-compulsive disorder, unspecified: Secondary | ICD-10-CM | POA: Diagnosis not present

## 2011-07-07 DIAGNOSIS — F429 Obsessive-compulsive disorder, unspecified: Secondary | ICD-10-CM | POA: Diagnosis not present

## 2011-07-08 DIAGNOSIS — R350 Frequency of micturition: Secondary | ICD-10-CM | POA: Diagnosis not present

## 2011-07-08 DIAGNOSIS — R3989 Other symptoms and signs involving the genitourinary system: Secondary | ICD-10-CM | POA: Diagnosis not present

## 2011-07-08 DIAGNOSIS — R351 Nocturia: Secondary | ICD-10-CM | POA: Diagnosis not present

## 2011-07-28 DIAGNOSIS — F429 Obsessive-compulsive disorder, unspecified: Secondary | ICD-10-CM | POA: Diagnosis not present

## 2011-08-02 DIAGNOSIS — F429 Obsessive-compulsive disorder, unspecified: Secondary | ICD-10-CM | POA: Diagnosis not present

## 2011-08-12 ENCOUNTER — Other Ambulatory Visit: Payer: Self-pay | Admitting: *Deleted

## 2011-08-12 MED ORDER — SIMVASTATIN 40 MG PO TABS: 40.0000 mg | ORAL_TABLET | Freq: Every day | ORAL | Status: DC

## 2011-08-23 DIAGNOSIS — R350 Frequency of micturition: Secondary | ICD-10-CM | POA: Diagnosis not present

## 2011-08-24 DIAGNOSIS — F429 Obsessive-compulsive disorder, unspecified: Secondary | ICD-10-CM | POA: Diagnosis not present

## 2011-09-01 DIAGNOSIS — F429 Obsessive-compulsive disorder, unspecified: Secondary | ICD-10-CM | POA: Diagnosis not present

## 2011-09-06 ENCOUNTER — Other Ambulatory Visit: Payer: Self-pay | Admitting: Internal Medicine

## 2011-09-06 MED ORDER — ALPRAZOLAM 0.5 MG PO TABS: 0.5000 mg | ORAL_TABLET | Freq: Every evening | ORAL | Status: DC | PRN

## 2011-09-06 MED ORDER — ALPRAZOLAM ER 0.5 MG PO TB24: 0.5000 mg | ORAL_TABLET | ORAL | Status: DC

## 2011-09-22 DIAGNOSIS — F429 Obsessive-compulsive disorder, unspecified: Secondary | ICD-10-CM | POA: Diagnosis not present

## 2011-09-24 ENCOUNTER — Ambulatory Visit (INDEPENDENT_AMBULATORY_CARE_PROVIDER_SITE_OTHER): Payer: BC Managed Care – PPO | Admitting: Internal Medicine

## 2011-09-24 ENCOUNTER — Encounter: Payer: Self-pay | Admitting: Internal Medicine

## 2011-09-24 VITALS — BP 106/68 | HR 68 | Temp 98.2°F | Resp 16 | Wt 200.0 lb

## 2011-09-24 DIAGNOSIS — N62 Hypertrophy of breast: Secondary | ICD-10-CM

## 2011-09-24 DIAGNOSIS — N6489 Other specified disorders of breast: Secondary | ICD-10-CM

## 2011-09-24 DIAGNOSIS — G5710 Meralgia paresthetica, unspecified lower limb: Secondary | ICD-10-CM

## 2011-09-24 DIAGNOSIS — G5711 Meralgia paresthetica, right lower limb: Secondary | ICD-10-CM

## 2011-09-24 MED ORDER — PREGABALIN 75 MG PO CAPS: 75.0000 mg | ORAL_CAPSULE | Freq: Two times a day (BID) | ORAL | Status: DC

## 2011-09-24 NOTE — Patient Instructions (Signed)
Meralgia Paresthetica   Meralgia paresthetica (MP) is a disorder characterized by tingling, numbness, and burning pain in the outer side of the thigh. It occurs in men more than women. MP is generally found in middle-aged or overweight people. Sometimes, the disorder may disappear. CAUSES The disorder is caused by a nerve in the thigh being squeezed (compressed). MP may be associated with tight clothing, pregnancy, diabetes, and being overweight (obese). SYMPTOMS  Tingling, numbness, and burning in the outer thigh.   An area of the skin may be painful and sensitive to the touch.  The symptoms often worsen after walking or standing. TREATMENT   Treatment is based on your symptoms and is mainly supportive. Treatment may include:  Wearing looser clothing.   Losing weight.   Avoiding prolonged standing or walking.   Taking medication.   Surgery if the pain is peristent or severe.  MP usually eases or disappears after treatment. Surgery is not always fully successful. Document Released: 05/14/2002 Document Revised: 05/13/2011 Document Reviewed: 05/24/2005 [facility] Patient Information 2012 [place], [place].

## 2011-09-24 NOTE — Progress Notes (Signed)
Patient ID: Tracey Wiley, female   DOB: [birth year], 56 y.o.   MRN: [id number]   Patient Active Problem List  Diagnoses  . HYPERLIPIDEMIA-MIXED  . SVT/ PSVT/ PAT  . PATENT FORAMEN OVALE  . CHEST PAIN-UNSPECIFIED  . Gastropathy  . Meralgia paresthetica of right side  . Breast hypertrophy in female    Subjective:  CC:   Chief Complaint  Patient presents with  . Follow-up    HPI:   Tracey Wiley is a 56 y.o. female who presents with multiple complaints.  Right leg feeling of pins and needles lateral thigh, going on for several weeks.  Aggravated by standing now throbbing and causing pain stabbing in nature     ,  Prior site of PFO closure April 2008.  Complicated by DVT right leg and numbness of both legs, (left leg after falling and having a large hematoma covering the high)  with prior neurology eval with EMG studies done in [place]  In 2009 howing bilateral nerve damage of diffferent etiologies .  Has tried magnet therapy which helped the pain in the right thigh but the skin is getting irritated from the tape. Now has totally numb feeling of the skin ,with underlying pain underneath.   2nd issue is desire for breast reduction .  Her breast size is a 87F and she is unable to walk upright ,  Having constant neck and back pain ,  Feeling short of breath with exercise. .  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  She has a history of general anesthesia problems  And will need to have her records,  From [place]  [facility] in [place] August 31 2006.  Whatever anaethesia was used was well tolerated by patient so she wants that cocktail, [place] was her OBGYN.  3rd issue is recurrent swelling of lymph node in right axillary area.,  Recent dental procedures and use of amoxicillin .  Swelling now resolved.  She has had a prior evaluation by Dr. Michela Pitcher (Gen Surg) for same .   Past Medical History  Diagnosis Date  . PFO (patent foramen ovale)   . Hyperlipidemia   . Obesity    . Chest pain, atypical     egd showing gastritis and hiatal hernia    Past Surgical History  Procedure Date  . Patent foramen ovale closure April 2008  . Tmj x 2   . Inner ear surgery     x 4  . Nasal septum surgery          The following portions of the patient's history were reviewed and updated as appropriate: Allergies, current medications, and problem list.    Review of Systems:   12 Pt  review of systems was negative except those addressed in the HPI,     History   Social History  . Marital Status: Married    Spouse Name: N/A    Number of Children: N/A  . Years of Education: N/A   Occupational History  . Not on file.   Social History Main Topics  . Smoking status: Never Smoker   . Smokeless tobacco: Never Used  . Alcohol Use: No  . Drug Use: No  . Sexually Active: Not on file   Other Topics Concern  . Not on file   Social History Narrative   Disabled    Objective:  BP 106/68  Pulse 68  Temp(Src) 98.2 F (36.8 C) (Oral)  Resp 16  Wt 200 lb (90.719  kg)  SpO2 96%  General appearance: alert, cooperative and appears stated age Ears: normal TM's and external ear canals both ears Throat: lips, mucosa, and tongue normal; teeth and gums normal Neck: no adenopathy, no carotid bruit, supple, symmetrical, trachea midline and thyroid not enlarged, symmetric, no tenderness/mass/nodules Back: symmetric, no curvature. ROM normal. No CVA tenderness. Lungs: clear to auscultation bilaterally Heart: regular rate and rhythm, S1, S2 normal, no murmur, click, rub or gallop Abdomen: soft, non-tender; bowel sounds normal; no masses,  no organomegaly Pulses: 2+ and symmetric Skin: Skin color, texture, turgor normal. No rashes or lesions Lymph nodes: Cervical, supraclavicular, and axillary nodes normal.  Assessment and Plan:  Breast hypertrophy in female She is suffering from her the results of pendulous breasts with persistent back pain leading to  destabilization of her center of balance, and deconditioning,  Will refer to Plastic Surgery for evaluation for breast reduction.   Meralgia paresthetica of right side Reassurance provided.  Trial of lyrica for relief of pain.     Updated Medication List Outpatient Encounter Prescriptions as of 09/24/2011  Medication Sig Dispense Refill  . acetaminophen (TYLENOL) 500 MG tablet Take 500 mg by mouth daily.        Marland Kitchen ALPRAZolam (XANAX XR) 0.5 MG 24 hr tablet Take 1 tablet (0.5 mg total) by mouth every morning.  30 tablet  3  . ALPRAZolam (XANAX) 0.5 MG tablet Take 1 tablet (0.5 mg total) by mouth at bedtime as needed.  30 tablet  3  . amoxicillin (AMOXIL) 500 MG capsule Take 2,000 mg by mouth. 1 hour prior to dental work       . ibuprofen (ADVIL,MOTRIN) 200 MG tablet Take 200 mg by mouth as needed.        . RABEprazole (ACIPHEX) 20 MG tablet Take 1 tablet (20 mg total) by mouth daily.  30 tablet  5  . simvastatin (ZOCOR) 40 MG tablet Take 1 tablet (40 mg total) by mouth at bedtime.  30 tablet  3  . sotalol (BETAPACE) 80 MG tablet Take 1/2 tab at 9 a.m. And 1 and 1/2 tabs at 9 p.m. And 1/2 tab as needed  225 tablet  6  . SOTALOL AF 80 MG TABS Take 80 mg by mouth 2 (two) times daily.        Marland Kitchen triamterene-hydrochlorothiazide (DYAZIDE) 37.5-25 MG per capsule Take 1 capsule by mouth every morning.  90 capsule  3  . pregabalin (LYRICA) 75 MG capsule Take 1 capsule (75 mg total) by mouth 2 (two) times daily.  60 capsule  2  . pregabalin (LYRICA) 75 MG capsule Take 1 capsule (75 mg total) by mouth 2 (two) times daily.  14 capsule  0  . DISCONTD: aspirin-acetaminophen-caffeine (EXCEDRIN MIGRAINE) 250-250-65 MG per tablet Take 1 tablet by mouth every 6 (six) hours as needed.        Marland Kitchen DISCONTD: Diphenhydramine-APAP, sleep, (EXCEDRIN PM) 38-500 MG TABS Take by mouth.

## 2011-09-26 DIAGNOSIS — N62 Hypertrophy of breast: Secondary | ICD-10-CM | POA: Insufficient documentation

## 2011-09-26 DIAGNOSIS — G5711 Meralgia paresthetica, right lower limb: Secondary | ICD-10-CM | POA: Insufficient documentation

## 2011-09-26 NOTE — Assessment & Plan Note (Signed)
She is suffering from her the results of pendulous breasts with persistent back pain leading to destabilization of her center of balance, and deconditioning,  Will refer to Plastic Surgery for evaluation for breast reduction.

## 2011-09-26 NOTE — Assessment & Plan Note (Signed)
Reassurance provided.  Trial of lyrica for relief of pain.

## 2011-09-28 DIAGNOSIS — F429 Obsessive-compulsive disorder, unspecified: Secondary | ICD-10-CM | POA: Diagnosis not present

## 2011-09-30 ENCOUNTER — Encounter: Payer: Self-pay | Admitting: Internal Medicine

## 2011-10-04 ENCOUNTER — Other Ambulatory Visit: Payer: Self-pay

## 2011-10-04 DIAGNOSIS — E785 Hyperlipidemia, unspecified: Secondary | ICD-10-CM

## 2011-10-12 ENCOUNTER — Other Ambulatory Visit: Payer: Medicare Other

## 2011-10-18 ENCOUNTER — Other Ambulatory Visit: Payer: BC Managed Care – PPO

## 2011-10-18 DIAGNOSIS — E785 Hyperlipidemia, unspecified: Secondary | ICD-10-CM

## 2011-10-19 ENCOUNTER — Other Ambulatory Visit: Payer: Medicare Other

## 2011-10-19 LAB — LIPID PANEL
Chol/HDL Ratio: 3.5 ratio units (ref 0.0–4.4)
LDL Calculated: 84 mg/dL (ref 0–99)

## 2011-10-19 LAB — HEPATIC FUNCTION PANEL
Albumin: 4.1 g/dL (ref 3.5–5.5)
Bilirubin, Direct: 0.08 mg/dL (ref 0.00–0.40)
Total Bilirubin: 0.3 mg/dL (ref 0.0–1.2)

## 2011-10-20 DIAGNOSIS — F429 Obsessive-compulsive disorder, unspecified: Secondary | ICD-10-CM | POA: Diagnosis not present

## 2011-10-27 DIAGNOSIS — H2510 Age-related nuclear cataract, unspecified eye: Secondary | ICD-10-CM | POA: Diagnosis not present

## 2011-10-27 DIAGNOSIS — F429 Obsessive-compulsive disorder, unspecified: Secondary | ICD-10-CM | POA: Diagnosis not present

## 2011-10-28 ENCOUNTER — Encounter: Payer: Self-pay | Admitting: Cardiovascular Disease

## 2011-10-28 ENCOUNTER — Ambulatory Visit (INDEPENDENT_AMBULATORY_CARE_PROVIDER_SITE_OTHER): Payer: BC Managed Care – PPO | Admitting: Cardiovascular Disease

## 2011-10-28 VITALS — BP 128/78 | HR 56 | Ht 65.0 in | Wt 197.0 lb

## 2011-10-28 DIAGNOSIS — I471 Supraventricular tachycardia: Secondary | ICD-10-CM | POA: Diagnosis not present

## 2011-10-28 DIAGNOSIS — R0602 Shortness of breath: Secondary | ICD-10-CM | POA: Diagnosis not present

## 2011-10-28 DIAGNOSIS — R079 Chest pain, unspecified: Secondary | ICD-10-CM | POA: Diagnosis not present

## 2011-10-28 DIAGNOSIS — R002 Palpitations: Secondary | ICD-10-CM | POA: Diagnosis not present

## 2011-10-28 DIAGNOSIS — E785 Hyperlipidemia, unspecified: Secondary | ICD-10-CM

## 2011-10-28 NOTE — Assessment & Plan Note (Signed)
No significant arrhythmia by her report. No changes to her medications

## 2011-10-28 NOTE — Patient Instructions (Signed)
You are doing well. No medication changes were made.  Try capascin cream for leg nerve pain  Please call us if you have new issues that need to be addressed before your next appt.  Your physician wants you to follow-up in: 6 months.  You will receive a reminder letter in the mail two months in advance. If you don't receive a letter, please call our office to schedule the follow-up appointment.

## 2011-10-28 NOTE — Assessment & Plan Note (Signed)
Cholesterol is excellent. We have suggested she stay on her current medication regimen.

## 2011-10-28 NOTE — Progress Notes (Signed)
Patient ID: Tracey Wiley, female    DOB: 1955-11-17, 56 y.o.   MRN: [id number]  HPI Comments: Tracey Wiley has a history of PFO with  closure  device in April 2008 performed in [place], hyperlipidemia, PAT versus paroxysmal atrial fibrillation on sotalol, hypertension who presents for routine followup. She is currently separated from her husband though legally married. She currently has a new boyfriend who is a Pharmacist, hospital. She is having difficulty losing weight.  She continues on sotalol twice a day and has rare episodes of tachycardia or palpitations. Most of her issues are related to her husband. She has mentioned that she would like to have breast reduction surgery. This was previously denied by one surgeon. She is even considering bilateral mastectomy. She reports having a "lump in her armpit"that would need to be resected prior to any surgery.  She is very active at baseline with no symptoms of chest pain or shortness of breath.  He does report having burning in the superficial anterior part of her right thigh  Recent total cholesterol 160 EKG shows normal sinus rhythm with rate 52 beats per minute, no significant ST or T wave changes  .   Outpatient Encounter Prescriptions as of 10/28/2011  Medication Sig Dispense Refill  . ALPRAZolam (XANAX) 0.5 MG tablet Take 1 tablet (0.5 mg total) by mouth at bedtime as needed.  30 tablet  3  . ibuprofen (ADVIL,MOTRIN) 200 MG tablet Take 200 mg by mouth as needed.        . RABEprazole (ACIPHEX) 20 MG tablet Take 1 tablet (20 mg total) by mouth daily.  30 tablet  5  . simvastatin (ZOCOR) 40 MG tablet Take 1 tablet (40 mg total) by mouth at bedtime.  30 tablet  3  . sotalol (BETAPACE) 80 MG tablet Take 1/2 tab at 9 a.m. And 1 and 1/2 tabs at 9 p.m. And 1/2 tab as needed  225 tablet  6  . triamterene-hydrochlorothiazide (DYAZIDE) 37.5-25 MG per capsule Take 1 capsule by mouth every morning.  90 capsule  3  . amoxicillin (AMOXIL) 500 MG capsule  Take 2,000 mg by mouth. 1 hour prior to dental work          Review of Systems  Constitutional: Negative.   HENT: Negative.   Eyes: Negative.   Respiratory: Negative.   Cardiovascular: Negative.   Gastrointestinal: Negative.   Musculoskeletal: Negative.   Skin: Negative.   Neurological: Negative.   Hematological: Negative.   Psychiatric/Behavioral: Negative.   All other systems reviewed and are negative.    BP 128/78  Pulse 56  Ht 5\' 5"  (1.651 m)  Wt 197 lb (89.359 kg)  BMI 32.78 kg/m2  Physical Exam  Nursing note and vitals reviewed. Constitutional: She is oriented to person, place, and time. She appears well-developed and well-nourished.  HENT:  Head: Normocephalic.  Nose: Nose normal.  Mouth/Throat: Oropharynx is clear and moist.  Eyes: Conjunctivae are normal. Pupils are equal, round, and reactive to light.  Neck: Normal range of motion. Neck supple. No JVD present.  Cardiovascular: Normal rate, regular rhythm, S1 normal, S2 normal, normal heart sounds and intact distal pulses.  Exam reveals no gallop and no friction rub.   No murmur heard. Pulmonary/Chest: Effort normal and breath sounds normal. No respiratory distress. She has no wheezes. She has no rales. She exhibits no tenderness.  Abdominal: Soft. Bowel sounds are normal. She exhibits no distension. There is no tenderness.  Musculoskeletal: Normal range of motion. She  exhibits no edema and no tenderness.  Lymphadenopathy:    She has no cervical adenopathy.  Neurological: She is alert and oriented to person, place, and time. Coordination normal.  Skin: Skin is warm and dry. No rash noted. No erythema.  Psychiatric: She has a normal mood and affect. Her behavior is normal. Judgment and thought content normal.         Assessment and Plan

## 2011-11-24 ENCOUNTER — Encounter: Payer: Self-pay | Admitting: Neurology

## 2011-11-30 ENCOUNTER — Encounter: Payer: Self-pay | Admitting: Neurology

## 2011-11-30 ENCOUNTER — Ambulatory Visit (INDEPENDENT_AMBULATORY_CARE_PROVIDER_SITE_OTHER): Payer: Medicare Other | Admitting: Neurology

## 2011-11-30 VITALS — BP 122/78 | HR 72 | Wt 200.0 lb

## 2011-11-30 DIAGNOSIS — R413 Other amnesia: Secondary | ICD-10-CM | POA: Diagnosis not present

## 2011-11-30 DIAGNOSIS — M48061 Spinal stenosis, lumbar region without neurogenic claudication: Secondary | ICD-10-CM | POA: Diagnosis not present

## 2011-11-30 DIAGNOSIS — S069XAA Unspecified intracranial injury with loss of consciousness status unknown, initial encounter: Secondary | ICD-10-CM

## 2011-11-30 DIAGNOSIS — S069X9A Unspecified intracranial injury with loss of consciousness of unspecified duration, initial encounter: Secondary | ICD-10-CM | POA: Diagnosis not present

## 2011-11-30 MED ORDER — LIDOCAINE 5 % EX PTCH: 2.0000 | MEDICATED_PATCH | CUTANEOUS | Status: AC

## 2011-11-30 NOTE — Progress Notes (Signed)
Dear Dr. Darrick Huntsman,  Thank you for having me see Tracey Wiley in consultation today at [facility] for her problem with right thigh numbness and memory problems.  As you may recall, she is a 56 y.o. year old female with a history of a traumatic brain injury in 1994 with a recurrent possible concussion in 2012 who has had worsening memory.  She describes difficulty remembering conversations from the same day as well as constant repeating of questions.  She does endorse increased stress recently as well.  She says she used to be able to remember phone numbers but this ability has gotten worse.  Notably she is on disability for a TBI sustained in 1994.  At that time she was knocked unconscious for an unknown length of time but did not have intracranial hemorrhage or surgery.  She denies hallucinations or delusions.  She is also suffering from progressive numbness and tingling of the right thigh that does not go below the knee.  She has not gained any weight.  This has gone one about 8 months.  She did fall at a water park and hit her head and may have been out "but saw stars" in early 2012 but there were no other precipitating events.  She has had a PFO closure through the right femoral artery and a subsequent clot but this was in 2008.  She denies weakness in the right leg.  She also suffers from chronic headaches.  She has seen pain management in the past for these.  Currenlty she takes regular excedrin and other OTC meds daily for these.  When she comes off of these for procedures her headaches get much worse.  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  Past Medical History  Diagnosis Date  . PFO (patent foramen ovale)   . Hyperlipidemia   . Obesity   . Chest pain, atypical     egd showing gastritis and hiatal hernia  . Fistula, labyrinthine 1994    1 surgery on right ear, 3 on left ear  .  Generalized tonic-clonic seizure 2002    No known cause - ?pain medications?  . Lumbar stenosis      Past Surgical History  Procedure Date  . Patent foramen ovale closure April 2008  . Tmj x 2   . Inner ear surgery     x 4  . Nasal septum surgery     History   Social History  . Marital Status: Married    Spouse Name: N/A    Number of Children: N/A  . Years of Education: N/A   Social History Main Topics  . Smoking status: Never Smoker   . Smokeless tobacco: Never Used  . Alcohol Use: No  . Drug Use: No  . Sexually Active: None   Other Topics Concern  . None   Social History Narrative   Disabled    Family History  Problem Relation Age of Onset  . Heart failure    . Cancer    . Coronary artery disease    . Diabetes    . Hyperlipidemia    . Hypertension    . Hyperlipidemia Mother   . Hypertension Mother   . Heart failure Father     Current Outpatient Prescriptions on File Prior to Visit  Medication Sig Dispense Refill  . ALPRAZolam (XANAX) 0.5 MG tablet Take 1 tablet (0.5  mg total) by mouth at bedtime as needed.  30 tablet  3  . ibuprofen (ADVIL,MOTRIN) 200 MG tablet Take 200 mg by mouth as needed.        . RABEprazole (ACIPHEX) 20 MG tablet Take 1 tablet (20 mg total) by mouth daily.  30 tablet  5  . simvastatin (ZOCOR) 40 MG tablet Take 1 tablet (40 mg total) by mouth at bedtime.  30 tablet  3  . sotalol (BETAPACE) 80 MG tablet Take 1/2 tab at 9 a.m. And 1 and 1/2 tabs at 9 p.m. And 1/2 tab as needed  225 tablet  6  . triamterene-hydrochlorothiazide (DYAZIDE) 37.5-25 MG per capsule Take 1 capsule by mouth every morning.  90 capsule  3  . amoxicillin (AMOXIL) 500 MG capsule Take 2,000 mg by mouth. 1 hour prior to dental work         Allergies  Allergen Reactions  . Ciprofloxacin     Increased risk for seizure  . Clarithromycin   . Codeine   . Hydrocodone-Acetaminophen   . Morphine   . Nitrofurantoin       ROS:  13 systems were reviewed and are  notable for chronic neck and back pain.  She also has chronic urinary urgency and frequency and seen a urologist who did a cystoscopy.  All other review of systems are unremarkable.   Examination:  Filed Vitals:   11/30/11 1038  BP: 122/78  Pulse: 72  Weight: 200 lb (90.719 kg)     In general, obese women in NAD.  Cardiovascular: The patient has a regular rate and rhythm and no carotid bruits.  Fundoscopy:  Disks are flat. Vessel caliber within normal limits.  Mental status:   MMSE 27/27  Cranial Nerves: Pupils are equally round and reactive to light. Visual fields full to confrontation. Extraocular movements are intact without nystagmus(however, head thrust appears bilaterally positive). Facial sensation and muscles of mastication are intact. Muscles of facial expression are symmetric. Hearing intact to bilateral finger rub. Tongue protrusion, uvula, palate midline.  Shoulder shrug intact  Motor:  The patient has normal bulk and tone, no pronator drift.  There are no adventitious movements.  5/5 muscle strength bilaterally.  Reflexes:   Biceps  Triceps Brachioradialis Knee Ankle  Right 2+  2+  2+   1+ 1+  Left  2+  2+  2+   1+ 1+  Toes down  Coordination:  Normal finger to nose.  No dysdiadokinesia.  Sensation is decreased to temp over a lateral patch in the mid lateral thigh.  Gait and Station are normal. Romberg is positive.  - SLR   Impression/Recs: 1.  Lateral femoral cutaneous nerve entrapment - I totally agree this is likely meralgia parasthetica.  I have recommended weight loss.  One could consider a trial of gabapentin but she would like to try lidocaine patches first. I think there is a much lower probability the problem is coming from her lumbar spine - although with her history of lumbar stenosis this still remains a concern.  She would like to defer on an EMG/NCS for now that would help exclude to a greater degree a L3 radiculopathy causing her thigh pain.   I am going to get an MRI of her L-spine given her history of lumbar stenosis.  2.  Memory Problems - Of uncertain etiology.  I wonder how much is due to psychologic stress.  However, given her previous history of head injury in 1994 and possible concussion in  2012 it is possible this is organic.  I am going to send her to Dr. Leonides Cave for NP testing.  3.  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  We will have to discuss this further when she returns but I would favor a steroid taper and weaning of the headache medications.   We will see the patient back in 2 months.  Thank you for having [place] see Tracey Wiley in consultation.  Feel free to contact me with any questions.  Lupita Raider Modesto Charon, MD [facility], [place] [address] [place], [place] [address] Phone: [phone] Fax: [phone].

## 2011-11-30 NOTE — Patient Instructions (Addendum)
We will refer you to the [facility] located at [address] in [place] for memory testing . They will call you to make the appointment.  The physician's name is Dr. Leonides Cave.   [phone].   Your MRI is scheduled at the [facility] right beside [facility]. The address is [address] [place]. Your appointment is Friday, June 28th at 5:00 pm.  Please arrive 15 minutes prior to your scheduled appointment.   [phone].

## 2011-12-03 ENCOUNTER — Other Ambulatory Visit (HOSPITAL_COMMUNITY): Payer: Medicare Other

## 2011-12-07 DIAGNOSIS — F429 Obsessive-compulsive disorder, unspecified: Secondary | ICD-10-CM | POA: Diagnosis not present

## 2011-12-10 ENCOUNTER — Ambulatory Visit (HOSPITAL_COMMUNITY)
Admission: RE | Admit: 2011-12-10 | Discharge: 2011-12-10 | Disposition: A | Discharge status: Home / Self Care | Payer: Medicare Other | Source: Ambulatory Visit | Attending: Neurology | Admitting: Neurology

## 2011-12-10 DIAGNOSIS — M5137 Other intervertebral disc degeneration, lumbosacral region: Secondary | ICD-10-CM | POA: Diagnosis not present

## 2011-12-10 DIAGNOSIS — M48061 Spinal stenosis, lumbar region without neurogenic claudication: Secondary | ICD-10-CM

## 2011-12-10 DIAGNOSIS — M79609 Pain in unspecified limb: Secondary | ICD-10-CM | POA: Diagnosis not present

## 2011-12-10 DIAGNOSIS — R209 Unspecified disturbances of skin sensation: Secondary | ICD-10-CM | POA: Diagnosis not present

## 2011-12-10 DIAGNOSIS — M545 Low back pain, unspecified: Secondary | ICD-10-CM | POA: Insufficient documentation

## 2011-12-10 DIAGNOSIS — M47817 Spondylosis without myelopathy or radiculopathy, lumbosacral region: Secondary | ICD-10-CM | POA: Diagnosis not present

## 2011-12-10 DIAGNOSIS — M51379 Other intervertebral disc degeneration, lumbosacral region without mention of lumbar back pain or lower extremity pain: Secondary | ICD-10-CM | POA: Insufficient documentation

## 2011-12-10 DIAGNOSIS — M5126 Other intervertebral disc displacement, lumbar region: Secondary | ICD-10-CM | POA: Diagnosis not present

## 2011-12-14 ENCOUNTER — Other Ambulatory Visit: Payer: Self-pay | Admitting: Internal Medicine

## 2011-12-14 ENCOUNTER — Other Ambulatory Visit: Payer: Self-pay | Admitting: Cardiovascular Disease

## 2011-12-16 ENCOUNTER — Telehealth: Payer: Self-pay | Admitting: Neurology

## 2011-12-16 NOTE — Telephone Encounter (Signed)
Left a voice message for the patient to return my call. 

## 2011-12-16 NOTE — Telephone Encounter (Signed)
Message copied by Benay Spice on Thu Dec 16, 2011 12:20 PM ------      Message from: Milas Gain      Created: Tue Dec 14, 2011 11:59 PM       Let Ms. Stock know that there is no clear cause of her leg pain seen on her MRI of her L-spine.  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

## 2011-12-17 NOTE — Telephone Encounter (Signed)
Tracey Wiley returned my call. Information given as per Dr. Modesto Charon below. Will see Dr. Modesto Charon in f/u and will discuss activity/exercise options with regard to her MRI results. No other issues voiced at this time.

## 2011-12-22 DIAGNOSIS — F429 Obsessive-compulsive disorder, unspecified: Secondary | ICD-10-CM | POA: Diagnosis not present

## 2011-12-27 DIAGNOSIS — F0781 Postconcussional syndrome: Secondary | ICD-10-CM | POA: Diagnosis not present

## 2012-01-04 DIAGNOSIS — F429 Obsessive-compulsive disorder, unspecified: Secondary | ICD-10-CM | POA: Diagnosis not present

## 2012-01-08 ENCOUNTER — Other Ambulatory Visit: Payer: Self-pay | Admitting: Cardiovascular Disease

## 2012-01-10 ENCOUNTER — Other Ambulatory Visit: Payer: Self-pay | Admitting: Internal Medicine

## 2012-01-10 ENCOUNTER — Other Ambulatory Visit: Payer: Self-pay | Admitting: *Deleted

## 2012-01-10 MED ORDER — ALPRAZOLAM ER 0.5 MG PO TB24: 0.5000 mg | ORAL_TABLET | ORAL | Status: DC

## 2012-01-10 MED ORDER — ALPRAZOLAM 0.5 MG PO TABS: 0.5000 mg | ORAL_TABLET | Freq: Every evening | ORAL | Status: DC | PRN

## 2012-01-10 MED ORDER — TRIAMTERENE-HCTZ 37.5-25 MG PO CAPS: 1.0000 | ORAL_CAPSULE | ORAL | Status: DC

## 2012-01-10 NOTE — Telephone Encounter (Signed)
Refilled TRIAMTERENE-HCTZ.

## 2012-01-10 NOTE — Telephone Encounter (Signed)
Pt called [facility] they told her to call us about rx She need  Her 2 xanax rx and several other not sure that they are She needs to leave today before 12 on emergency Please advise when call in

## 2012-01-10 NOTE — Telephone Encounter (Signed)
Rxs called in, patient notified.

## 2012-01-17 DIAGNOSIS — F0781 Postconcussional syndrome: Secondary | ICD-10-CM | POA: Diagnosis not present

## 2012-01-27 DIAGNOSIS — F429 Obsessive-compulsive disorder, unspecified: Secondary | ICD-10-CM | POA: Diagnosis not present

## 2012-02-03 DIAGNOSIS — F0781 Postconcussional syndrome: Secondary | ICD-10-CM | POA: Diagnosis not present

## 2012-02-08 ENCOUNTER — Ambulatory Visit: Payer: Medicare Other | Admitting: Neurology

## 2012-02-08 DIAGNOSIS — F429 Obsessive-compulsive disorder, unspecified: Secondary | ICD-10-CM | POA: Diagnosis not present

## 2012-02-11 ENCOUNTER — Ambulatory Visit: Payer: Medicare Other | Admitting: Neurology

## 2012-02-14 DIAGNOSIS — D3130 Benign neoplasm of unspecified choroid: Secondary | ICD-10-CM | POA: Diagnosis not present

## 2012-02-17 DIAGNOSIS — Z1329 Encounter for screening for other suspected endocrine disorder: Secondary | ICD-10-CM | POA: Diagnosis not present

## 2012-02-17 DIAGNOSIS — R87810 Cervical high risk human papillomavirus (HPV) DNA test positive: Secondary | ICD-10-CM | POA: Diagnosis not present

## 2012-02-17 DIAGNOSIS — Z131 Encounter for screening for diabetes mellitus: Secondary | ICD-10-CM | POA: Diagnosis not present

## 2012-02-17 DIAGNOSIS — E782 Mixed hyperlipidemia: Secondary | ICD-10-CM | POA: Diagnosis not present

## 2012-02-17 DIAGNOSIS — B977 Papillomavirus as the cause of diseases classified elsewhere: Secondary | ICD-10-CM | POA: Diagnosis not present

## 2012-02-17 DIAGNOSIS — R5381 Other malaise: Secondary | ICD-10-CM | POA: Diagnosis not present

## 2012-02-17 DIAGNOSIS — Z136 Encounter for screening for cardiovascular disorders: Secondary | ICD-10-CM | POA: Diagnosis not present

## 2012-02-17 DIAGNOSIS — Z9189 Other specified personal risk factors, not elsewhere classified: Secondary | ICD-10-CM | POA: Diagnosis not present

## 2012-02-17 DIAGNOSIS — R9389 Abnormal findings on diagnostic imaging of other specified body structures: Secondary | ICD-10-CM | POA: Diagnosis not present

## 2012-02-17 DIAGNOSIS — E559 Vitamin D deficiency, unspecified: Secondary | ICD-10-CM | POA: Diagnosis not present

## 2012-02-17 DIAGNOSIS — D3910 Neoplasm of uncertain behavior of unspecified ovary: Secondary | ICD-10-CM | POA: Diagnosis not present

## 2012-02-17 DIAGNOSIS — R5383 Other fatigue: Secondary | ICD-10-CM | POA: Diagnosis not present

## 2012-02-18 ENCOUNTER — Encounter: Payer: Self-pay | Admitting: Neurology

## 2012-02-18 ENCOUNTER — Ambulatory Visit (INDEPENDENT_AMBULATORY_CARE_PROVIDER_SITE_OTHER): Payer: Medicare Other | Admitting: Neurology

## 2012-02-18 VITALS — BP 110/64 | HR 84 | Wt 204.0 lb

## 2012-02-18 DIAGNOSIS — M542 Cervicalgia: Secondary | ICD-10-CM

## 2012-02-18 MED ORDER — PROCHLORPERAZINE MALEATE 5 MG PO TABS: 5.0000 mg | ORAL_TABLET | Freq: Four times a day (QID) | ORAL | Status: DC | PRN

## 2012-02-18 MED ORDER — AMITRIPTYLINE HCL 25 MG PO TABS: 25.0000 mg | ORAL_TABLET | Freq: Every day | ORAL | Status: DC

## 2012-02-18 MED ORDER — DEXAMETHASONE 2 MG PO TABS: ORAL_TABLET | ORAL | Status: AC

## 2012-02-18 NOTE — Patient Instructions (Addendum)
Your MRI is scheduled for Thursday, Sept 19 at 11:00am.  Please arrive to [facility], first floor radiology by 10:45am.  [phone].  We will fax your pain referral to Dr. Thyra Breed and contact you as soon as we have an appointment.

## 2012-02-18 NOTE — Progress Notes (Signed)
Dear Dr. Darrick Huntsman,  Thank you for having me see Tracey Wiley in follow up today at [facility] for her problem with right thigh numbness and memory problems.  As you may recall, she is a 56 y.o. year old female with a history of a traumatic brain injury in 1994 with a recurrent possible concussion in 2012 who has had worsening memory.  She describes difficulty remembering conversations from the same day as well as constant repeating of questions.  She does endorse increased stress recently as well.  She says she used to be able to remember phone numbers but this ability has gotten worse.  Notably she is on disability for a TBI sustained in 1994.  At that time she was knocked unconscious for an unknown length of time but did not have intracranial hemorrhage or surgery.  She denies hallucinations or delusions.  She is also suffering from progressive numbness and tingling of the right thigh that does not go below the knee.  She has not gained any weight.  This has gone one about 8 months.  She did fall at a water park and hit her head and may have been out "but saw stars" in early 2012 but there were no other precipitating events.  She has had a PFO closure through the right femoral artery and a subsequent clot but this was in 2008.  She denies weakness in the right leg.  She also suffers from chronic headaches.  She has seen pain management in the past for these.  Currenlty she takes regular excedrin and other OTC meds daily for these.  When she comes off of these for procedures her headaches get much worse.  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  -------------------------------------------  At her last visit, while I felt her right leg numbness and pain was meralgia parasthetica I did go ahead and get an MRI of her L-spine because of pain in that area.  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    NP testing by Dr. Leonides Cave felt there may be some mild dysfunction, likely contributed to by her history of concussion as well as intercurrent stress.  She continues to get frequent headaches, she uses caffeine pills every day to treat these and has done this for years.  She has tried to stop the caffeine for two weeks but this has resulted in worse headaches.  Current Outpatient Prescriptions on File Prior to Visit  Medication Sig Dispense Refill  . ACIPHEX 20 MG tablet TAKE 1 TABLET (20 MG TOTAL) BY MOUTH DAILY.  30 tablet  5  . ALPRAZolam (XANAX XR) 0.5 MG 24 hr tablet Take 1 tablet (0.5 mg total) by mouth every morning.  30 tablet  3  . ALPRAZolam (XANAX) 0.5 MG tablet Take 1 tablet (0.5 mg total) by mouth at bedtime as needed.  30 tablet  3  . ibuprofen (ADVIL,MOTRIN) 200 MG tablet Take 200 mg by mouth as needed.        . sotalol (BETAPACE) 80 MG tablet Take 1/2 tab at 9 a.m. And 1 and 1/2 tabs at 9 p.m. And 1/2 tab as needed  225 tablet  6  . triamterene-hydrochlorothiazide (DYAZIDE) 37.5-25 MG per capsule Take 1 each (1 capsule total)  by mouth every morning.  90 capsule  3  . VALTREX 1 G tablet TAKE 2 TABLETS BY MOUTH 3 TIMES A DAY AS NEEDED  15 tablet  3  . ZOCOR 40 MG tablet TAKE 1 TABLET BY MOUTH AT BEDTIME.  30 tablet  3  . amitriptyline (ELAVIL) 25 MG tablet Take 1 tablet (25 mg total) by mouth at bedtime.  30 tablet  3  . amoxicillin (AMOXIL) 500 MG capsule Take 2,000 mg by mouth. 1 hour prior to dental work       . prochlorperazine (COMPAZINE) 5 MG tablet Take 1 tablet (5 mg total) by mouth every 6 (six) hours as needed (severe headache).  30 tablet  2     ROS:  13 systems were reviewed and are notable for chronic neck and back pain.  She also has chronic urinary urgency and frequency and seen a urologist who did a cystoscopy.  All other review of  systems are unremarkable.   Examination:  Filed Vitals:   02/18/12 1113  BP: 110/64  Pulse: 84  Weight: 204 lb (92.534 kg)     In general, obese women in NAD.  Cranial Nerves: Pupils are equally round and reactive to light. Visual fields full to confrontation. Extraocular movements are intact without nystagmus(however, head thrust appears bilaterally positive). Facial sensation and muscles of mastication are intact. Muscles of facial expression are symmetric. Hearing intact to bilateral finger rub. Tongue protrusion, uvula, palate midline.  Shoulder shrug intact  Motor:  The patient has normal bulk and tone, no pronator drift.  There are no adventitious movements.  5/5 muscle strength bilaterally.  Reflexes:   Biceps  Triceps Brachioradialis Knee Ankle  Right 2+  2+  2+   1+ 1+  Left  2+  2+  2+   1+ 1+  Toes down  Coordination:  Normal finger to nose.  No dysdiadokinesia.  Sensation is decreased to temp over a lateral patch in the mid lateral thigh.  Gait and Station are normal. Romberg is positive.  - SLR   Impression/Recs: 1.  Right thigh numbness - likely lateral femoral cutaneous nerve involvement.  I don't think this is coming from her back.   2.  Memory Problems - Most likely from stress + mild concussion.  Nothing to do here.  3.  Neck pain and lower back pain - I am going to get an MRI of her C-spine and refer her to Dr. Vear Clock to see if ESI or other local inventions might help her neck and back pain.  I don't think it will help her right thigh numbness however.  One may want to try gabapentin later on for this.  It would also be desirable if she could lose weight -- she is exercising more.  4.  CDH - I am going to give her a steroid taper of Decadron and compazine 5mg  prn during her rebound period.  She is to stop all caffeine and over the counter drugs.  She can use Tylenol or ibuprofen after she breaks the rebound cycle and I have given her Elavil 25mg  qhs to  start.  She should try to not use any over the counter medication or other headache medication more than 10 days per month.  She will follow up with Dr. Arbutus Leas on an asneeded basis.  Lupita Raider Modesto Charon, MD [facility], [place] [address] [place], [place] [address] Phone: [phone] Fax: [phone].

## 2012-02-23 DIAGNOSIS — F429 Obsessive-compulsive disorder, unspecified: Secondary | ICD-10-CM | POA: Diagnosis not present

## 2012-02-24 ENCOUNTER — Other Ambulatory Visit (HOSPITAL_COMMUNITY): Payer: Medicare Other

## 2012-02-29 ENCOUNTER — Other Ambulatory Visit (HOSPITAL_COMMUNITY): Payer: Medicare Other

## 2012-02-29 DIAGNOSIS — F429 Obsessive-compulsive disorder, unspecified: Secondary | ICD-10-CM | POA: Diagnosis not present

## 2012-03-07 ENCOUNTER — Ambulatory Visit (HOSPITAL_COMMUNITY)
Admission: RE | Admit: 2012-03-07 | Discharge: 2012-03-07 | Disposition: A | Discharge status: Home / Self Care | Payer: Medicare Other | Source: Ambulatory Visit | Attending: Neurology | Admitting: Neurology

## 2012-03-07 ENCOUNTER — Ambulatory Visit (HOSPITAL_COMMUNITY): Admission: RE | Admit: 2012-03-07 | Payer: Medicare Other | Source: Ambulatory Visit

## 2012-03-07 DIAGNOSIS — M2548 Effusion, other site: Secondary | ICD-10-CM | POA: Diagnosis not present

## 2012-03-07 DIAGNOSIS — M542 Cervicalgia: Secondary | ICD-10-CM | POA: Insufficient documentation

## 2012-03-08 DIAGNOSIS — F429 Obsessive-compulsive disorder, unspecified: Secondary | ICD-10-CM | POA: Diagnosis not present

## 2012-04-04 ENCOUNTER — Ambulatory Visit: Payer: Medicare Other | Admitting: Neurology

## 2012-04-05 ENCOUNTER — Other Ambulatory Visit: Payer: Self-pay | Admitting: Cardiovascular Disease

## 2012-04-05 NOTE — Telephone Encounter (Signed)
Refilled Zocor

## 2012-04-13 DIAGNOSIS — F429 Obsessive-compulsive disorder, unspecified: Secondary | ICD-10-CM | POA: Diagnosis not present

## 2012-04-18 ENCOUNTER — Encounter: Payer: Self-pay | Admitting: Neurology

## 2012-04-18 ENCOUNTER — Ambulatory Visit (INDEPENDENT_AMBULATORY_CARE_PROVIDER_SITE_OTHER): Payer: Medicare Other | Admitting: Neurology

## 2012-04-18 VITALS — BP 126/70 | HR 60 | Temp 98.2°F | Resp 16 | Ht 65.0 in | Wt 206.0 lb

## 2012-04-18 DIAGNOSIS — M542 Cervicalgia: Secondary | ICD-10-CM

## 2012-04-18 DIAGNOSIS — G5710 Meralgia paresthetica, unspecified lower limb: Secondary | ICD-10-CM

## 2012-04-18 DIAGNOSIS — R51 Headache: Secondary | ICD-10-CM | POA: Diagnosis not present

## 2012-04-18 MED ORDER — CELECOXIB 200 MG PO CAPS: 200.0000 mg | ORAL_CAPSULE | Freq: Every day | ORAL | Status: DC
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
## 2012-04-18 NOTE — Patient Instructions (Signed)
1.  Use celebrex as needed 2.  Try acupuncture 3.  Try the self help routines found in the book  "The Touch of Healing" 4.  Attend pain clinic consult on 04/26/12 5.  Return in 8 weeks to see if better, worse or same.

## 2012-04-25 ENCOUNTER — Ambulatory Visit: Payer: Medicare Other | Admitting: Cardiovascular Disease

## 2012-04-26 ENCOUNTER — Ambulatory Visit: Payer: Medicare Other | Admitting: Cardiovascular Disease

## 2012-04-26 ENCOUNTER — Ambulatory Visit (INDEPENDENT_AMBULATORY_CARE_PROVIDER_SITE_OTHER): Payer: Medicare Other | Admitting: Internal Medicine

## 2012-04-26 ENCOUNTER — Encounter: Payer: Self-pay | Admitting: Internal Medicine

## 2012-04-26 ENCOUNTER — Telehealth: Payer: Self-pay | Admitting: Internal Medicine

## 2012-04-26 VITALS — BP 110/66 | HR 65 | Temp 98.5°F | Resp 12 | Ht 65.0 in | Wt 206.0 lb

## 2012-04-26 DIAGNOSIS — R062 Wheezing: Secondary | ICD-10-CM | POA: Diagnosis not present

## 2012-04-26 DIAGNOSIS — J111 Influenza due to unidentified influenza virus with other respiratory manifestations: Secondary | ICD-10-CM

## 2012-04-26 DIAGNOSIS — J101 Influenza due to other identified influenza virus with other respiratory manifestations: Secondary | ICD-10-CM

## 2012-04-26 DIAGNOSIS — Z139 Encounter for screening, unspecified: Secondary | ICD-10-CM | POA: Diagnosis not present

## 2012-04-26 LAB — POCT INFLUENZA A/B
Influenza A, POC: POSITIVE
Influenza B, POC: POSITIVE

## 2012-04-26 MED ORDER — AMOXICILLIN-POT CLAVULANATE 875-125 MG PO TABS: 1.0000 | ORAL_TABLET | Freq: Two times a day (BID) | ORAL | Status: DC

## 2012-04-26 MED ORDER — OSELTAMIVIR PHOSPHATE 75 MG PO CAPS: 75.0000 mg | ORAL_CAPSULE | Freq: Two times a day (BID) | ORAL | Status: DC

## 2012-04-26 MED ORDER — PREDNISONE (PAK) 10 MG PO TABS: ORAL_TABLET | ORAL | Status: DC

## 2012-04-26 MED ORDER — BENZONATATE 200 MG PO CAPS: 200.0000 mg | ORAL_CAPSULE | Freq: Two times a day (BID) | ORAL | Status: DC | PRN

## 2012-04-26 MED ORDER — ALBUTEROL SULFATE HFA 108 (90 BASE) MCG/ACT IN AERS: 2.0000 | INHALATION_SPRAY | Freq: Four times a day (QID) | RESPIRATORY_TRACT | Status: DC | PRN

## 2012-04-26 NOTE — Progress Notes (Signed)
Patient ID: Tracey Wiley, female   DOB: 05-22-1956, 56 y.o.   MRN:   Patient Active Problem List  Diagnosis  . HYPERLIPIDEMIA-MIXED  . SVT/ PSVT/ PAT  . PATENT FORAMEN OVALE  . CHEST PAIN-UNSPECIFIED  . Gastropathy  . Meralgia paresthetica of right side  . Breast hypertrophy in female  . Influenza A    Subjective:  CC:   Chief Complaint  Patient presents with  . Cough    HPI:   Tracey Wiley a 56 y.o. female who presents with a  Viral syndrome .  She has a dry cough accompanied by severe headache,.  And a wet cough sounds like a goose is honking , severe myalgias,  Her hair and skin hurts sympomts have been present for 2 to 3 days. . Eye cataract right side  by porfilio last month    Past Medical History  Diagnosis Date  . PFO (patent foramen ovale)   . Hyperlipidemia   . Obesity   . Chest pain, atypical     egd showing gastritis and hiatal hernia  . Fistula, labyrinthine 1994    1 surgery on right ear, 3 on left ear  . Generalized tonic-clonic seizure 2002    No known cause - ?pain medications?  . Lumbar stenosis     Past Surgical History  Procedure Date  . Patent foramen ovale closure April 2008  . Tmj x 2   . Inner ear surgery     x 4  . Nasal septum surgery          The following portions of the patient's history were reviewed and updated as appropriate: Allergies, current medications, and problem list.    Review of Systems:   12 Pt  review of systems was negative except those addressed in the HPI,     History   Social History  . Marital Status: Married    Spouse Name: N/A    Number of Children: N/A  . Years of Education: N/A   Occupational History  . Not on file.   Social History Main Topics  . Smoking status: Never Smoker   . Smokeless tobacco: Never Used  . Alcohol Use: No  . Drug Use: No  . Sexually Active: Not on file   Other Topics Concern  . Not on file   Social History Narrative   Disabled    Objective:  BP  110/66  Pulse 65  Temp 98.5 F (36.9 C) (Oral)  Resp 12  Ht 5\' 5"  (1.651 m)  Wt 206 lb (93.441 kg)  BMI 34.28 kg/m2  SpO2 95%  General appearance: sickly appearing Ears: red injected, left TM with serous effusion  Throat: lips, mucosa, and tongue normal; teeth and gums normal Neck: cervical LAD,  no carotid bruit, supple, symmetrical, trachea midline and thyroid not enlarged, symmetric, no tenderness/mass/nodules Back: symmetric, no curvature. ROM normal. No CVA tenderness. Lungs: wheezing noted  bilaterally Heart: regular rate and rhythm, S1, S2 normal, no murmur, click, rub or gallop Abdomen: soft, non-tender; bowel sounds normal; no masses,  no organomegaly Pulses: 2+ and symmetric Skin: Skin color, texture, turgor normal. No rashes or lesions Lymph nodes: Cervical, supraclavicular, and axillary nodes normal.  Assessment and Plan: Influenza A With bronchospasm and otitis media also noted. Tamiflu, prednisone ,  Albuterol and cough medicine prescribed.  Given  Exam with otitis media also noted.  Added  Empiric  Antibiotic.      Updated Medication List Outpatient Encounter Prescriptions as of 04/26/2012  Medication Sig Dispense Refill  . ALPRAZolam (XANAX XR) 0.5 MG 24 hr tablet Take 1 tablet (0.5 mg total) by mouth every morning.  30 tablet  3  . ALPRAZolam (XANAX) 0.5 MG tablet Take 1 tablet (0.5 mg total) by mouth at bedtime as needed.  30 tablet  3  . amoxicillin (AMOXIL) 500 MG capsule Take 2,000 mg by mouth. 1 hour prior to dental work       . celecoxib (CELEBREX) 200 MG capsule Take 1 capsule (200 mg total) by mouth daily.  30 capsule  5  . ibuprofen (ADVIL,MOTRIN) 200 MG tablet Take 200 mg by mouth as needed.        Marland Kitchen omeprazole (PRILOSEC) 10 MG capsule Take 10 mg by mouth daily.      . sotalol (BETAPACE) 80 MG tablet Take 1/2 tab at 9 a.m. And 1 and 1/2 tabs at 9 p.m. And 1/2 tab as needed  225 tablet  6  . triamterene-hydrochlorothiazide (DYAZIDE) 37.5-25 MG per  capsule Take 1 each (1 capsule total) by mouth every morning.  90 capsule  3  . VALTREX 1 G tablet TAKE 2 TABLETS BY MOUTH 3 TIMES A DAY AS NEEDED  15 tablet  3  . ZOCOR 40 MG tablet TAKE 1 TABLET BY MOUTH AT BEDTIME.  30 tablet  3  . ACIPHEX 20 MG tablet TAKE 1 TABLET (20 MG TOTAL) BY MOUTH DAILY.  30 tablet  5  . albuterol (PROVENTIL HFA;VENTOLIN HFA) 108 (90 BASE) MCG/ACT inhaler Inhale 2 puffs into the lungs every 6 (six) hours as needed for wheezing.  1 Inhaler  0  . amoxicillin-clavulanate (AUGMENTIN) 875-125 MG per tablet Take 1 tablet by mouth 2 (two) times daily.  14 tablet  0  . benzonatate (TESSALON) 200 MG capsule Take 1 capsule (200 mg total) by mouth 2 (two) times daily as needed for cough.  60 capsule  1  . chlorpheniramine-HYDROcodone (TUSSIONEX) 10-8 MG/5ML LQCR Take 10 mLs by mouth every 12 (twelve) hours as needed.  240 mL  0  . oseltamivir (TAMIFLU) 75 MG capsule Take 1 capsule (75 mg total) by mouth 2 (two) times daily.  14 capsule  0  . predniSONE (STERAPRED UNI-PAK) 10 MG tablet 6 tablets on day 1, decrease by tablet daily until gone  21 tablet  0  . prochlorperazine (COMPAZINE) 5 MG tablet Take 1 tablet (5 mg total) by mouth every 6 (six) hours as needed (severe headache).  30 tablet  2

## 2012-04-26 NOTE — Telephone Encounter (Signed)
Patient Information:  Caller Name: Tracey Wiley  Phone: [phone]  Patient: Tracey Wiley, Tracey Wiley  Gender: Female  DOB: 1956-05-27  Age: 56 Years  PCP: Duncan Dull (Adults only)  Pregnant: No   Symptoms  Reason For Call & Symptoms: Friend, Tracey Wiley calling about a productive cough x 5 days, sore throat  Reviewed Health History In EMR: Yes  Reviewed Medications In EMR: Yes  Reviewed Allergies In EMR: Yes  Date of Onset of Symptoms: 04/21/2012  Treatments Tried: Green tea with garlic, honey and lemon  Treatments Tried Worked: No OB:  LMP: Unknown  Guideline(s) Used:  Cough  Disposition Per Guideline:   See Today or Tomorrow in Office  Reason For Disposition Reached:   Continuous (nonstop) coughing interferes with work or school and no improvement using cough treatment per Care Advice  Advice Given:  Call Back If:  You become worse.  Office Follow Up:  Does the office need to follow up with this patient?: No  Instructions For The Office: N/A  Called and spoke with Danella Deis about the 2p and 415p appt that is showing in the schedule. They have just been used and no additional appts available for today.  She advised to send to UC.  I sent to UC.

## 2012-04-26 NOTE — Patient Instructions (Addendum)
Start the tamiflu and augmentin tonight, use the tessalon perles for cough immediately.   Use the albuterol inhaler 2 puffs every 6 hours as needed for chest tightness/wheezing  Add  The prednisone if not feeling better in 48 hours  Call if the cough does not respond to current meds  And we will add tussionex   Use align

## 2012-04-28 ENCOUNTER — Telehealth: Payer: Self-pay | Admitting: Internal Medicine

## 2012-04-28 ENCOUNTER — Encounter: Payer: Self-pay | Admitting: Internal Medicine

## 2012-04-28 MED ORDER — HYDROCOD POLST-CHLORPHEN POLST 10-8 MG/5ML PO LQCR: 10.0000 mL | Freq: Two times a day (BID) | ORAL | Status: DC | PRN

## 2012-04-28 NOTE — Telephone Encounter (Signed)
Pt stated that dr Darrick Huntsman would call her in some cough meds if her coughing is not any better Mr. Horiuchi will be here at @ 11:30 to see dr Clydia Llano [place]

## 2012-04-28 NOTE — Telephone Encounter (Signed)
Called pharmacy to verify that rx was at pharmacy.

## 2012-04-30 ENCOUNTER — Encounter: Payer: Self-pay | Admitting: Internal Medicine

## 2012-04-30 DIAGNOSIS — J101 Influenza due to other identified influenza virus with other respiratory manifestations: Secondary | ICD-10-CM | POA: Insufficient documentation

## 2012-04-30 NOTE — Assessment & Plan Note (Addendum)
With bronchospasm and otitis media also noted. Tamiflu, prednisone ,  Albuterol and cough medicine prescribed.  Given  Exam with otitis media also noted.  Added  Empiric  Antibiotic.

## 2012-05-01 ENCOUNTER — Ambulatory Visit: Payer: Medicare Other | Admitting: Cardiovascular Disease

## 2012-05-01 ENCOUNTER — Other Ambulatory Visit: Payer: Self-pay | Admitting: Internal Medicine

## 2012-05-02 ENCOUNTER — Other Ambulatory Visit: Payer: Self-pay

## 2012-05-11 ENCOUNTER — Encounter: Payer: Self-pay | Admitting: Cardiovascular Disease

## 2012-05-11 ENCOUNTER — Telehealth: Payer: Self-pay | Admitting: Internal Medicine

## 2012-05-11 ENCOUNTER — Ambulatory Visit (INDEPENDENT_AMBULATORY_CARE_PROVIDER_SITE_OTHER): Payer: Medicare Other | Admitting: Cardiovascular Disease

## 2012-05-11 VITALS — BP 122/90 | HR 59 | Ht 65.0 in | Wt 204.2 lb

## 2012-05-11 DIAGNOSIS — I471 Supraventricular tachycardia, unspecified: Secondary | ICD-10-CM

## 2012-05-11 DIAGNOSIS — E559 Vitamin D deficiency, unspecified: Secondary | ICD-10-CM | POA: Insufficient documentation

## 2012-05-11 DIAGNOSIS — N6459 Other signs and symptoms in breast: Secondary | ICD-10-CM

## 2012-05-11 DIAGNOSIS — E785 Hyperlipidemia, unspecified: Secondary | ICD-10-CM | POA: Diagnosis not present

## 2012-05-11 DIAGNOSIS — R079 Chest pain, unspecified: Secondary | ICD-10-CM

## 2012-05-11 MED ORDER — ERGOCALCIFEROL 1.25 MG (50000 UT) PO CAPS: 50000.0000 [IU] | ORAL_CAPSULE | ORAL | Status: DC

## 2012-05-11 NOTE — Assessment & Plan Note (Signed)
She does report discomfort from her breast size. This is her biggest problem contributing to chronic back pain. She is very interested in surgical intervention. We will try to identify a surgeon who would be willing to perform a breast reduction surgery.

## 2012-05-11 NOTE — Assessment & Plan Note (Signed)
We will discuss the new lab finding with Dr. Darrick Huntsman. She was previously on 50,000 units per week. Perhaps she could restart this.

## 2012-05-11 NOTE — Telephone Encounter (Signed)
Called in  Vit D megadose to her pharamacy.  To take weekly for the winter.  Will look into [facility] to see if there is a breast reduction surgeon who will see her.

## 2012-05-11 NOTE — Patient Instructions (Addendum)
You are doing well. No medication changes were made.  We will take some labs today  Please call us if you have new issues that need to be addressed before your next appt.  Your physician wants you to follow-up in: 6 months.  You will receive a reminder letter in the mail two months in advance. If you don't receive a letter, please call our office to schedule the follow-up appointment.

## 2012-05-11 NOTE — Assessment & Plan Note (Signed)
Continue sotalol. Currently with minimal symptoms.

## 2012-05-11 NOTE — Assessment & Plan Note (Signed)
We have recommended she have fasting study to reevaluate triglycerides. She does not want to wait until next year and would prefer to do this today as she is fasting. If triglycerides continue to be elevated, we have recommended strict diet, exercise, increasing fish oil. Could also add fenofibrate if needed.

## 2012-05-11 NOTE — Progress Notes (Signed)
Patient ID: Tracey Wiley, female    DOB: Jul 14, 1955, 56 y.o.   MRN: [id number]  HPI Comments: Tracey Wiley has a history of PFO with  closure  device in April 2008 performed in [place], hyperlipidemia, PAT versus paroxysmal atrial fibrillation on sotalol, hypertension who presents for routine followup. She is currently separated from her husband though legally married. She currently has a new boyfriend who is a Pharmacist, hospital. She is having difficulty losing weight.  She continues on sotalol twice a day and has rare episodes of tachycardia or palpitations. She has mentioned that she would like to have breast reduction surgery. This was previously denied by one surgeon. She reports having a "lump in her armpit"that would need to be resected prior to any surgery. This has been followed by Dr. Michela Pitcher and is stable in size. She reports prior biopsy showed benign findings. She is very active at baseline with no symptoms of chest pain or shortness of breath.  He does report having burning in the superficial anterior part of her right thigh. Currently uses a Lidoderm patch  Nonfasting lipids done in September 2013. She was told triglycerides were 350. Low vitamin D. She is requesting fasting labs  EKG shows normal sinus rhythm with rate 59 beats per minute, no significant ST or T wave changes  .   Outpatient Encounter Prescriptions as of 05/11/2012  Medication Sig Dispense Refill  . albuterol (PROVENTIL HFA;VENTOLIN HFA) 108 (90 BASE) MCG/ACT inhaler Inhale 2 puffs into the lungs every 6 (six) hours as needed for wheezing.  1 Inhaler  0  . amoxicillin (AMOXIL) 500 MG capsule Take 2,000 mg by mouth. 1 hour prior to dental work       . celecoxib (CELEBREX) 200 MG capsule Take 200 mg by mouth as needed.      Marland Kitchen ibuprofen (ADVIL,MOTRIN) 200 MG tablet Take 200 mg by mouth as needed.        . lidocaine (LIDODERM) 5 % Place 1 patch onto the skin daily. Remove & Discard patch within 12 hours or as directed by  MD      . omeprazole (PRILOSEC) 10 MG capsule Take 10 mg by mouth daily.      . sotalol (BETAPACE) 80 MG tablet Take 1/2 tab at 9 a.m. And 1 and 1/2 tabs at 9 p.m. And 1/2 tab as needed  225 tablet  6  . triamterene-hydrochlorothiazide (DYAZIDE) 37.5-25 MG per capsule Take 1 each (1 capsule total) by mouth every morning.  90 capsule  3  . VALTREX 1 G tablet TAKE 2 TABLETS BY MOUTH 3 TIMES A DAY AS NEEDED  15 tablet  3  . XANAX 0.5 MG tablet TAKE 1 TABLET BY MOUTH AT BEDTIME AS NEEDED  30 tablet  3  . XANAX XR 0.5 MG 24 hr tablet TAKE 1 TABLET BY MOUTH IN THE MORNING  30 tablet  3  . ZOCOR 40 MG tablet TAKE 1 TABLET BY MOUTH AT BEDTIME.  30 tablet  3    Review of Systems  Constitutional: Negative.   HENT: Negative.   Eyes: Negative.   Respiratory: Negative.   Cardiovascular: Negative.   Gastrointestinal: Negative.   Musculoskeletal: Negative.   Skin: Negative.   Neurological: Negative.   Hematological: Negative.   Psychiatric/Behavioral: Negative.   All other systems reviewed and are negative.   BP 122/90  Pulse 59  Ht 5\' 5"  (1.651 m)  Wt 204 lb 4 oz (92.647 kg)  BMI 33.99 kg/m2  Physical Exam  Nursing note and vitals reviewed. Constitutional: She is oriented to person, place, and time. She appears well-developed and well-nourished.       Obese  HENT:  Head: Normocephalic.  Nose: Nose normal.  Mouth/Throat: Oropharynx is clear and moist.  Eyes: Conjunctivae normal are normal. Pupils are equal, round, and reactive to light.  Neck: Normal range of motion. Neck supple. No JVD present.  Cardiovascular: Normal rate, regular rhythm, S1 normal, S2 normal, normal heart sounds and intact distal pulses.  Exam reveals no gallop and no friction rub.   No murmur heard. Pulmonary/Chest: Effort normal and breath sounds normal. No respiratory distress. She has no wheezes. She has no rales. She exhibits no tenderness.  Abdominal: Soft. Bowel sounds are normal. She exhibits no distension.  There is no tenderness.  Musculoskeletal: Normal range of motion. She exhibits no edema and no tenderness.  Lymphadenopathy:    She has no cervical adenopathy.  Neurological: She is alert and oriented to person, place, and time. Coordination normal.  Skin: Skin is warm and dry. No rash noted. No erythema.  Psychiatric: She has a normal mood and affect. Her behavior is normal. Judgment and thought content normal.         Assessment and Plan

## 2012-05-12 LAB — LIPID PANEL
Chol/HDL Ratio: 4.3 ratio units (ref 0.0–4.4)
LDL Calculated: 121 mg/dL — ABNORMAL HIGH (ref 0–99)

## 2012-05-12 NOTE — Telephone Encounter (Signed)
Left detailed message on patient vm with instructions.

## 2012-05-15 DIAGNOSIS — H6120 Impacted cerumen, unspecified ear: Secondary | ICD-10-CM | POA: Diagnosis not present

## 2012-05-15 DIAGNOSIS — H903 Sensorineural hearing loss, bilateral: Secondary | ICD-10-CM | POA: Diagnosis not present

## 2012-05-17 DIAGNOSIS — F429 Obsessive-compulsive disorder, unspecified: Secondary | ICD-10-CM | POA: Diagnosis not present

## 2012-05-22 ENCOUNTER — Other Ambulatory Visit: Payer: Self-pay | Admitting: Cardiovascular Disease

## 2012-05-22 NOTE — Telephone Encounter (Signed)
Refilled Betapace.

## 2012-05-24 ENCOUNTER — Ambulatory Visit (INDEPENDENT_AMBULATORY_CARE_PROVIDER_SITE_OTHER): Payer: Medicare Other | Admitting: Internal Medicine

## 2012-05-24 ENCOUNTER — Encounter: Payer: Self-pay | Admitting: Internal Medicine

## 2012-05-24 VITALS — BP 100/62 | HR 52 | Temp 98.3°F | Resp 12 | Ht 65.0 in | Wt 207.0 lb

## 2012-05-24 DIAGNOSIS — N62 Hypertrophy of breast: Secondary | ICD-10-CM | POA: Diagnosis not present

## 2012-05-24 DIAGNOSIS — E669 Obesity, unspecified: Secondary | ICD-10-CM

## 2012-05-24 DIAGNOSIS — Z1331 Encounter for screening for depression: Secondary | ICD-10-CM

## 2012-05-24 NOTE — Patient Instructions (Addendum)
You need to get down to 180 lbs to lower your BMI to < 30  Consider going online and reading about the MediFast diet   (the chiropractor in front of [employer] on [address] also does a seminar)   This is  my version of a  "Low GI"  Diet:  All of the foods can be found at grocery stores and in bulk at [employer].  The Atkins protein bars and shakes are available in more varieties at [employer], [employer] and [employer].     7 AM Breakfast:  Low carbohydrate Protein  Shakes (I recommend the EAS AdvantEdge "Carb Control" shakes  Or the low carb shakes by Atkins.   Both are available everywhere:  In  cases at [employer]Js  Or in 4 packs at grocery stores and pharmacies  2.5 carbs  (Alternative is  a toasted Arnold's Sandwhich Thin w/ peanut butter, a "Bagel Thin" with cream cheese and salmon) or  a scrambled egg burrito made with a low carb tortilla .  Avoid cereal and bananas, oatmeal too unless you are cooking the old fashioned kind that takes 30-40 minutes to prepare.  the rest is overly processed, has minimal fiber, and is loaded with carbohydrates!   10 AM: Protein bar by Atkins (the snack size, under 200 cal).  There are many varieties , available widely again or in bulk in limited varieties at BJs)  Other so called "protein bars" tend to be loaded with carbohydrates.  Remember, in food advertising, the word "energy" is synonymous for " carbohydrate."  Lunch: sandwich of [place], (or any lunchmeat, grilled meat or canned tuna), fresh avocado, mayonnaise  and cheese on a lower carbohydrate pita bread, flatbread, or tortilla . Ok to use regular mayonnaise. The bread is the only source or carbohydrate that can be decreased (Joseph's makes a pita bread    and a flat bread that are 50 cal and 4 net carbs ; Toufayan makes a low carb flatbread that's 100 cal and 9 net carbs  and  Mission makes a low carb whole wheat tortilla  That is 210 cal and 6 net carbs)  3 PM:  Mid day :  Another protein bar,  Or a  cheese  stick (100 cal, 0 carbs),  Or 1 ounce of  almonds, walnuts, pistachios, pecans, peanuts,  Macadamia nuts. Or a Dannon light n Fit greek yogurt, 80 cal 8 net carbs . Avoid "granola"; the dried cranberries and raisins are loaded with carbohydrates. Mixed nuts ok if no raisins or cranberries or dried fruit.      6 PM  Dinner:  "mean and green:"  Meat/chicken/fish or a high protein legume; , with a green salad, and a low GI  Veggie (broccoli, cauliflower, green beans, spinach, brussel sprouts. Lima beans) : Avoid "Low fat dressings, as well as Reyne Dumas and [address]! They are loaded with sugar! Instead use ranch, vinagrette,  Blue cheese, etc.  There is a low carb pasta by Dreamfield's available at [employer] [facility] that is acceptable and tastes great. Try Michel Angelo's chicken piccata over low carb pasta. The chicken dish is 0 carbs, and can be found in frozen section at BJs and [employer]'s. Also try [employer] "Carnitas" (pulled pork, no sauce,  0 carbs) and his pot roast.   both are in the refrigerated section at BJs   9 PM snack : Breyer's "low carb" fudgsicle or  ice cream bar (Carb Smart line), or  Weight Watcher's ice  cream bar , or another "no sugar added" ice cream;a serving of fresh berries/cherries with whipped cream (Avoid bananas, pineapple, grapes  and watermelon on a regular basis because they are high in sugar)   Remember that snack Substitutions should be less than 15 to 20 carbs  Per serving. Remember to subtract fiber grams and sugar alcohols to get the "net carbs."

## 2012-05-24 NOTE — Progress Notes (Signed)
Patient ID: Tracey Wiley, female   DOB: 06-16-1955, 56 y.o.   MRN: [id number]    Patient Active Problem List  Diagnosis  . HYPERLIPIDEMIA-MIXED  . SVT/ PSVT/ PAT  . PATENT FORAMEN OVALE  . CHEST PAIN-UNSPECIFIED  . Gastropathy  . Meralgia paresthetica of right side  . Breast hypertrophy in female  . Influenza A  . Vitamin D deficiency    Subjective:  CC:   Chief Complaint  Patient presents with  . Advice Only    breast reduction    HPI:   Tracey Wiley a 56 y.o. female who presents with back pain.  She is having difficulty exercising with her personal trainer because of pendulous breasts. Size 44 EEE .  To 56 F .  Dr. Odis Luster of [facility] did not want to give her the reduction because of her history of post operative MRSA infections.  Her chronic neck and back pain is aggravated by disk disease, seen on MRI of cervical and thoracic spine.  She is taking PT.  She cannot lose weight despite constant dieting but has not tried eating smaller meals.     Past Medical History  Diagnosis Date  . PFO (patent foramen ovale)   . Hyperlipidemia   . Obesity   . Chest pain, atypical     egd showing gastritis and hiatal hernia  . Fistula, labyrinthine 1994    1 surgery on right ear, 3 on left ear  . Generalized tonic-clonic seizure 2002    No known cause - ?pain medications?  . Lumbar stenosis   . History of pneumonia     Past Surgical History  Procedure Date  . Patent foramen ovale closure April 2008  . Tmj x 2   . Inner ear surgery     x 4  . Nasal septum surgery          The following portions of the patient's history were reviewed and updated as appropriate: Allergies, current medications, and problem list.    Review of Systems:   Patient denies headache, fevers, malaise, unintentional weight loss, skin rash, eye pain, sinus congestion and sinus pain, sore throat, dysphagia,  hemoptysis , cough, dyspnea, wheezing, chest pain, palpitations, orthopnea,  edema, abdominal pain, nausea, melena, diarrhea, constipation, flank pain, dysuria, hematuria, urinary  Frequency, nocturia, numbness, tingling, seizures,  Focal weakness, Loss of consciousness,  Tremor, insomnia, depression, anxiety, and suicidal ideation.       History   Social History  . Marital Status: Married    Spouse Name: N/A    Number of Children: N/A  . Years of Education: N/A   Occupational History  . Not on file.   Social History Main Topics  . Smoking status: Never Smoker   . Smokeless tobacco: Never Used  . Alcohol Use: No  . Drug Use: No  . Sexually Active: Not on file   Other Topics Concern  . Not on file   Social History Narrative   Disabled    Objective:  BP 100/62  Pulse 52  Temp 98.3 F (36.8 C) (Oral)  Resp 12  Ht 5\' 5"  (1.651 m)  Wt 207 lb (93.895 kg)  BMI 34.45 kg/m2  SpO2 97%  General appearance: alert, cooperative and appears stated age Ears: normal TM's and external ear canals both ears Throat: lips, mucosa, and tongue normal; teeth and gums normal Neck: no adenopathy, no carotid bruit, supple, symmetrical, trachea midline and thyroid not enlarged, symmetric, no tenderness/mass/nodules Back: symmetric, no curvature.  ROM normal. No CVA tenderness. Lungs: clear to auscultation bilaterally Heart: regular rate and rhythm, S1, S2 normal, no murmur, click, rub or gallop Abdomen: soft, non-tender; bowel sounds normal; no masses,  no organomegaly Pulses: 2+ and symmetric Skin: Skin color, texture, turgor normal. No rashes or lesions Lymph nodes: Cervical, supraclavicular, and axillary nodes normal.  Assessment and Plan:  Breast hypertrophy in female She is seeking breast augmentation but has been turned by [facility] due to her BMI being over 30.  Recommended low GI diet and frequent small meals to stimulate metabolism.    Obesity (BMI 30-39.9) Her BMI remains unchanged despite exercise.   I am advising her to get back on the low GI  diet using six smaller meals a day to stimulate her metabolism.     Updated Medication List Outpatient Encounter Prescriptions as of 05/24/2012  Medication Sig Dispense Refill  . albuterol (PROVENTIL HFA;VENTOLIN HFA) 108 (90 BASE) MCG/ACT inhaler Inhale 2 puffs into the lungs every 6 (six) hours as needed for wheezing.  1 Inhaler  0  . amoxicillin (AMOXIL) 500 MG capsule Take 2,000 mg by mouth. 1 hour prior to dental work       . celecoxib (CELEBREX) 200 MG capsule Take 200 mg by mouth as needed.      . ergocalciferol (DRISDOL) 50000 UNITS capsule Take 1 capsule (50,000 Units total) by mouth once a week.  4 capsule  3  . ibuprofen (ADVIL,MOTRIN) 200 MG tablet Take 200 mg by mouth as needed.        . lidocaine (LIDODERM) 5 % Place 1 patch onto the skin daily. Remove & Discard patch within 12 hours or as directed by MD      . omeprazole (PRILOSEC) 10 MG capsule Take 10 mg by mouth daily.      . sotalol (BETAPACE) 80 MG tablet TAKE 1/2 TAB AT 9 A.M. AND 1 AND 1/2 TABS AT 9 P.M. AND 1/2 TAB AS NEEDED  225 tablet  3  . triamterene-hydrochlorothiazide (DYAZIDE) 37.5-25 MG per capsule Take 1 each (1 capsule total) by mouth every morning.  90 capsule  3  . VALTREX 1 G tablet TAKE 2 TABLETS BY MOUTH 3 TIMES A DAY AS NEEDED  15 tablet  3  . XANAX 0.5 MG tablet TAKE 1 TABLET BY MOUTH AT BEDTIME AS NEEDED  30 tablet  3  . XANAX XR 0.5 MG 24 hr tablet TAKE 1 TABLET BY MOUTH IN THE MORNING  30 tablet  3  . ZOCOR 40 MG tablet TAKE 1 TABLET BY MOUTH AT BEDTIME.  30 tablet  3

## 2012-05-28 ENCOUNTER — Encounter: Payer: Self-pay | Admitting: Internal Medicine

## 2012-05-28 DIAGNOSIS — E669 Obesity, unspecified: Secondary | ICD-10-CM | POA: Insufficient documentation

## 2012-05-28 NOTE — Assessment & Plan Note (Signed)
She is seeking breast augmentation but has been turned by [facility] due to her BMI being over 30.  Recommended low GI diet and frequent small meals to stimulate metabolism.

## 2012-05-28 NOTE — Assessment & Plan Note (Signed)
Her BMI remains unchanged despite exercise.   I am advising her to get back on the low GI diet using six smaller meals a day to stimulate her metabolism.

## 2012-06-09 DIAGNOSIS — F429 Obsessive-compulsive disorder, unspecified: Secondary | ICD-10-CM | POA: Diagnosis not present

## 2012-06-12 DIAGNOSIS — M542 Cervicalgia: Secondary | ICD-10-CM | POA: Diagnosis not present

## 2012-06-15 DIAGNOSIS — M542 Cervicalgia: Secondary | ICD-10-CM | POA: Diagnosis not present

## 2012-06-20 DIAGNOSIS — M542 Cervicalgia: Secondary | ICD-10-CM | POA: Diagnosis not present

## 2012-06-20 DIAGNOSIS — F429 Obsessive-compulsive disorder, unspecified: Secondary | ICD-10-CM | POA: Diagnosis not present

## 2012-06-23 DIAGNOSIS — M542 Cervicalgia: Secondary | ICD-10-CM | POA: Diagnosis not present

## 2012-06-27 DIAGNOSIS — F429 Obsessive-compulsive disorder, unspecified: Secondary | ICD-10-CM | POA: Diagnosis not present

## 2012-06-27 DIAGNOSIS — M542 Cervicalgia: Secondary | ICD-10-CM | POA: Diagnosis not present

## 2012-06-29 DIAGNOSIS — M542 Cervicalgia: Secondary | ICD-10-CM | POA: Diagnosis not present

## 2012-07-08 DIAGNOSIS — M542 Cervicalgia: Secondary | ICD-10-CM | POA: Diagnosis not present

## 2012-07-11 DIAGNOSIS — F429 Obsessive-compulsive disorder, unspecified: Secondary | ICD-10-CM | POA: Diagnosis not present

## 2012-07-11 DIAGNOSIS — M542 Cervicalgia: Secondary | ICD-10-CM | POA: Diagnosis not present

## 2012-07-18 DIAGNOSIS — M542 Cervicalgia: Secondary | ICD-10-CM | POA: Diagnosis not present

## 2012-07-18 DIAGNOSIS — F429 Obsessive-compulsive disorder, unspecified: Secondary | ICD-10-CM | POA: Diagnosis not present

## 2012-07-25 DIAGNOSIS — F429 Obsessive-compulsive disorder, unspecified: Secondary | ICD-10-CM | POA: Diagnosis not present

## 2012-08-01 DIAGNOSIS — F429 Obsessive-compulsive disorder, unspecified: Secondary | ICD-10-CM | POA: Diagnosis not present

## 2012-08-07 ENCOUNTER — Telehealth: Payer: Self-pay | Admitting: *Deleted

## 2012-08-07 ENCOUNTER — Other Ambulatory Visit: Payer: Self-pay | Admitting: Cardiovascular Disease

## 2012-08-07 ENCOUNTER — Telehealth: Payer: Self-pay | Admitting: Physician Assistant

## 2012-08-07 DIAGNOSIS — I4891 Unspecified atrial fibrillation: Secondary | ICD-10-CM | POA: Diagnosis not present

## 2012-08-07 DIAGNOSIS — I10 Essential (primary) hypertension: Secondary | ICD-10-CM | POA: Diagnosis not present

## 2012-08-07 DIAGNOSIS — R0602 Shortness of breath: Secondary | ICD-10-CM | POA: Diagnosis not present

## 2012-08-07 DIAGNOSIS — R079 Chest pain, unspecified: Secondary | ICD-10-CM | POA: Diagnosis not present

## 2012-08-07 DIAGNOSIS — E785 Hyperlipidemia, unspecified: Secondary | ICD-10-CM | POA: Diagnosis not present

## 2012-08-07 DIAGNOSIS — Z86718 Personal history of other venous thrombosis and embolism: Secondary | ICD-10-CM | POA: Diagnosis not present

## 2012-08-07 DIAGNOSIS — K219 Gastro-esophageal reflux disease without esophagitis: Secondary | ICD-10-CM | POA: Diagnosis not present

## 2012-08-07 DIAGNOSIS — Z79899 Other long term (current) drug therapy: Secondary | ICD-10-CM | POA: Diagnosis not present

## 2012-08-07 DIAGNOSIS — R42 Dizziness and giddiness: Secondary | ICD-10-CM | POA: Diagnosis not present

## 2012-08-07 LAB — BASIC METABOLIC PANEL
Calcium, Total: 9 mg/dL (ref 8.5–10.1)
Chloride: 105 mmol/L (ref 98–107)
Co2: 30 mmol/L (ref 21–32)
Creatinine: 0.82 mg/dL (ref 0.60–1.30)
EGFR (African American): >60
Osmolality: 281 (ref 275–301)
Potassium: 4.2 mmol/L (ref 3.5–5.1)
Sodium: 140 mmol/L (ref 136–145)

## 2012-08-07 LAB — CBC
HGB: 13.7 g/dL (ref 12.0–16.0)
MCH: 29.3 pg (ref 26.0–34.0)
MCV: 88 fL (ref 80–100)
Platelet: 289 10*3/uL (ref 150–440)
RBC: 4.68 10*6/uL (ref 3.80–5.20)
RDW: 14.5 % (ref 11.5–14.5)
WBC: 8.7 10*3/uL (ref 3.6–11.0)

## 2012-08-07 LAB — CK TOTAL AND CKMB (NOT AT ARMC)
CK, Total: 109 U/L (ref 21–215)
CK-MB: 0.7 ng/mL (ref 0.5–3.6)

## 2012-08-07 LAB — TROPONIN I
Troponin-I: <0.02 ng/mL
Troponin-I: <0.02 ng/mL

## 2012-08-07 NOTE — Telephone Encounter (Signed)
It seems that she talked with PA

## 2012-08-07 NOTE — Telephone Encounter (Signed)
[email or web address]

## 2012-08-07 NOTE — Telephone Encounter (Signed)
Pt called back Says she has hiatal hernia and GERD Swallowed large pills last night and thinks they "got stuck" Awoke this am with isolated chest pain on left side of chest that radiates to back, better with walking and belching; also worse when she was bearing down for BM Able to sleep "like a baby"; worse when she got up  Denies sob Says she went to ER for this same reason multiple times in past and was told it was r/t GI cause Concerned b/c she has hx PFO repair and is always anxious she is having cardiac condition when this occurs  Says the last time she was in ER Dr. Mariah Milling came to see her at 0700 and reassured her this was GI related and advised against coming back to ER for same symptoms in future  Is taking omeprazole 10 mg qd only  I reassured her these symptoms do sound more GI related than cardiac related but I would discuss with Dr. Kirke Corin today and call her back with his suggestion  She warns me she had stress echo in [place] and vomited during test d/t vertigo; would have to have Lexiscan nuc if one was suggested  I will discuss with Dr. Kirke Corin and call her back Understanding verb

## 2012-08-07 NOTE — Telephone Encounter (Signed)
Refilled Zocor #30 [facility]ill# [facility].

## 2012-08-07 NOTE — Telephone Encounter (Signed)
Pt calling stating that she is having some chest discomfort. Pt took her vitamin last night and thinks it got stuck due to her hernia. Pt had gas pains and burping. Pt having mid to left chest pain radiating to back worse with movement. Burping makes it feel better. Gollan told her last time to not go to er. No nitro taking, no other symptoms.

## 2012-08-07 NOTE — Telephone Encounter (Signed)
FYI See telephone note from earlier today as well

## 2012-08-07 NOTE — Telephone Encounter (Signed)
See phone note from earlier. Pt complained of constant substernal chest discomfort. Has h/o hiatal hernia and GERD. Pain is similar to prior acid reflux discomfort but worse than usual and is now radiating to her back. She does have some discomfort when she moves her extremities around but it is difficult to tell if that's the same pain. She says she has h/o being immunosuppressed as well. She states our office was supposed to call her back to schedule her to come in for an EKG today for evaluation. However, unfortunately our office has now closed due to the weather and all calls are being forwarded to the hospital. Given patient's age, being female, it's difficult to say for certain if CP is cardiac or noncardiac. The radiation of pain is concerning, but positional nature is somewhat atypical. I advised she be seen in person either in urgent care or ER for further evaluation. She knows not to drive herself. She verbalized understanding and gratitude and plans to proceed. Dayna Dunn PA-C

## 2012-08-08 ENCOUNTER — Telehealth: Payer: Self-pay | Admitting: Internal Medicine

## 2012-08-08 ENCOUNTER — Telehealth: Payer: Self-pay

## 2012-08-08 ENCOUNTER — Observation Stay: Payer: Self-pay | Admitting: Student

## 2012-08-08 DIAGNOSIS — I4891 Unspecified atrial fibrillation: Secondary | ICD-10-CM | POA: Diagnosis not present

## 2012-08-08 DIAGNOSIS — R079 Chest pain, unspecified: Secondary | ICD-10-CM | POA: Diagnosis not present

## 2012-08-08 DIAGNOSIS — M79609 Pain in unspecified limb: Secondary | ICD-10-CM | POA: Diagnosis not present

## 2012-08-08 DIAGNOSIS — E785 Hyperlipidemia, unspecified: Secondary | ICD-10-CM | POA: Diagnosis not present

## 2012-08-08 DIAGNOSIS — R071 Chest pain on breathing: Secondary | ICD-10-CM | POA: Diagnosis not present

## 2012-08-08 DIAGNOSIS — I10 Essential (primary) hypertension: Secondary | ICD-10-CM | POA: Diagnosis not present

## 2012-08-08 LAB — CK TOTAL AND CKMB (NOT AT ARMC)
CK, Total: 80 U/L (ref 21–215)
CK, Total: 73 U/L (ref 21–215)
CK-MB: <0.5 ng/mL — ABNORMAL LOW (ref 0.5–3.6)
CK-MB: <0.5 ng/mL — ABNORMAL LOW (ref 0.5–3.6)

## 2012-08-08 LAB — TROPONIN I
Troponin-I: <0.02 ng/mL
Troponin-I: <0.02 ng/mL

## 2012-08-08 NOTE — Telephone Encounter (Signed)
Pt called me from her hospital bed, in room 253 Says she was admitted last night after presenting to the ER with chest pain  Says she was told by staff today she cannot go home "until Dr. Mariah Milling comes to see me". I explained Dr. Mariah Milling is out of town and will not be here. She says she told staff this but thinks Dr. Kirke Corin should come by. I explained, unless an order is placed by hospitalist for cardiology consult, we cannot have cardiologist come by She states, "I feel like I am being held hostage. I want to leave" I told her I would call and speak with her nurse, Revonda Standard, to see what orders were placed   I was able to speak with Revonda Standard, nurse on 2A. She explains hospitalist came by to see pt earlier today to d/c pt but she was having CT scan. Says he is coming back this pm to d/c her. No orders have been written for cardiology consult  I called pt back on cell phone and [facility] ZO:[id number] info

## 2012-08-08 NOTE — Telephone Encounter (Signed)
Pt scheduled for 08/25/12 at 2:45 for 30 min [facility] f/u

## 2012-08-09 NOTE — Telephone Encounter (Signed)
Pt was called back. Stated she had pain in chest that radiated into back, went to ER where she was admitted. All tests came back negative. Pt states that the pain got better at hospital once home and moving around states that it is about a 4/10. Went to hospital when it was an 84. Dr. Claiborne Billings discharged her thinks that it is her hiatal hernia that is bothering her, acid reflux med was discontinued and she recently switched to prilosec.

## 2012-08-11 DIAGNOSIS — K209 Esophagitis, unspecified without bleeding: Secondary | ICD-10-CM | POA: Diagnosis not present

## 2012-08-15 DIAGNOSIS — F429 Obsessive-compulsive disorder, unspecified: Secondary | ICD-10-CM | POA: Diagnosis not present

## 2012-08-21 ENCOUNTER — Telehealth: Payer: Self-pay | Admitting: Internal Medicine

## 2012-08-21 MED ORDER — PREDNISONE (PAK) 10 MG PO TABS: ORAL_TABLET | ORAL | Status: DC

## 2012-08-21 NOTE — Telephone Encounter (Signed)
Ok to refill prednisone   [facility] [place]

## 2012-08-21 NOTE — Telephone Encounter (Signed)
Pt.notified

## 2012-08-21 NOTE — Telephone Encounter (Signed)
Caller: Tracey Wiley/Patient; Phone: [phone]; Reason for Call: Patient is calling because she is currently having another neck spasm that it making it difficult for her to move her neck at all.  This is exactly what happened to her a few years ago.  In the past Dr.  Darrick Huntsman has put her on Celebrex and a Prednisone Taper to get the Spasm under control.  Dr.  Darrick Huntsman advised Patient to start the Celebrex right away and then call her in order to get the Prednisone Taper.  She has an appointment scheduled to see Dr.  Darrick Huntsman this Friday 08/25/12 as a follow-up to a Hospital Admission 2 weeks ago where they did tests to see if she had a heart attack.  After the tests were done they decided she had Acid Reflux.  Patient would like the Prednisone called in today so she can get started on it because she can not handle the pain until she is seen on Friday.  [facility] @ [phone].  She has plenty of Celebrex at this time.  She can be reached @ [phone] - Please call her when it is called in to the pharmacy.

## 2012-08-25 ENCOUNTER — Encounter: Payer: Self-pay | Admitting: Internal Medicine

## 2012-08-25 ENCOUNTER — Ambulatory Visit (INDEPENDENT_AMBULATORY_CARE_PROVIDER_SITE_OTHER): Payer: Medicare Other | Admitting: Internal Medicine

## 2012-08-25 VITALS — BP 120/74 | HR 60 | Temp 98.2°F | Resp 16 | Wt 204.0 lb

## 2012-08-25 DIAGNOSIS — K2100 Gastro-esophageal reflux disease with esophagitis, without bleeding: Secondary | ICD-10-CM

## 2012-08-25 DIAGNOSIS — Z31438 Encounter for other genetic testing of female for procreative management: Secondary | ICD-10-CM

## 2012-08-25 DIAGNOSIS — J111 Influenza due to unidentified influenza virus with other respiratory manifestations: Secondary | ICD-10-CM

## 2012-08-25 DIAGNOSIS — M542 Cervicalgia: Secondary | ICD-10-CM | POA: Diagnosis not present

## 2012-08-25 DIAGNOSIS — E669 Obesity, unspecified: Secondary | ICD-10-CM

## 2012-08-25 DIAGNOSIS — Z9189 Other specified personal risk factors, not elsewhere classified: Secondary | ICD-10-CM

## 2012-08-25 DIAGNOSIS — IMO0001 Reserved for inherently not codable concepts without codable children: Secondary | ICD-10-CM

## 2012-08-25 DIAGNOSIS — M8580 Other specified disorders of bone density and structure, unspecified site: Secondary | ICD-10-CM

## 2012-08-25 DIAGNOSIS — J101 Influenza due to other identified influenza virus with other respiratory manifestations: Secondary | ICD-10-CM

## 2012-08-25 DIAGNOSIS — F411 Generalized anxiety disorder: Secondary | ICD-10-CM

## 2012-08-25 DIAGNOSIS — M545 Low back pain, unspecified: Secondary | ICD-10-CM

## 2012-08-25 DIAGNOSIS — M949 Disorder of cartilage, unspecified: Secondary | ICD-10-CM | POA: Diagnosis not present

## 2012-08-25 DIAGNOSIS — Z1239 Encounter for other screening for malignant neoplasm of breast: Secondary | ICD-10-CM

## 2012-08-25 DIAGNOSIS — Z1379 Encounter for other screening for genetic and chromosomal anomalies: Secondary | ICD-10-CM

## 2012-08-25 DIAGNOSIS — M899 Disorder of bone, unspecified: Secondary | ICD-10-CM

## 2012-08-25 DIAGNOSIS — Q211 Atrial septal defect: Secondary | ICD-10-CM

## 2012-08-25 DIAGNOSIS — F0781 Postconcussional syndrome: Secondary | ICD-10-CM

## 2012-08-25 MED ORDER — CHLORHEXIDINE GLUCONATE 0.12 % MT SOLN: 15.0000 mL | Freq: Two times a day (BID) | OROMUCOSAL | Status: DC
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
## 2012-08-25 NOTE — Patient Instructions (Signed)
I recommend Dr. Greggory Keen for your gynecology needs.

## 2012-08-27 ENCOUNTER — Encounter: Payer: Self-pay | Admitting: Internal Medicine

## 2012-08-27 DIAGNOSIS — F0781 Postconcussional syndrome: Secondary | ICD-10-CM | POA: Insufficient documentation

## 2012-08-27 DIAGNOSIS — S069X9A Unspecified intracranial injury with loss of consciousness of unspecified duration, initial encounter: Secondary | ICD-10-CM | POA: Insufficient documentation

## 2012-08-27 DIAGNOSIS — Z1379 Encounter for other screening for genetic and chromosomal anomalies: Secondary | ICD-10-CM | POA: Insufficient documentation

## 2012-08-27 DIAGNOSIS — M542 Cervicalgia: Secondary | ICD-10-CM | POA: Insufficient documentation

## 2012-08-27 DIAGNOSIS — F411 Generalized anxiety disorder: Secondary | ICD-10-CM | POA: Insufficient documentation

## 2012-08-27 DIAGNOSIS — S069XAA Unspecified intracranial injury with loss of consciousness status unknown, initial encounter: Secondary | ICD-10-CM | POA: Insufficient documentation

## 2012-08-27 DIAGNOSIS — K2100 Gastro-esophageal reflux disease with esophagitis, without bleeding: Secondary | ICD-10-CM | POA: Insufficient documentation

## 2012-08-27 NOTE — Assessment & Plan Note (Signed)
She is dismayed as she has not lost more weight to report. However she has been distracted by health matters including recent hospitalization for chest pain, and occipital headaches caused by an expressive. These issues are now working as those out and she is in 4 to getting back into a regular routine of exercise and diet.    

## 2012-08-27 NOTE — Assessment & Plan Note (Signed)
Her recent respiratory infection with him for influenza A left her with bilateral infiltrates noted on chest CT done during recent hospitalization for chest pain. Her symptoms have resolved she will need a repeat chest CT in mid April to ensure resolution of infiltrates. This will require premedication with prednisone and Benadryl.

## 2012-08-27 NOTE — Assessment & Plan Note (Signed)
Recent neuropsychological testing by Dr. Annamaria Helling, completed in August 2013 fell within normal limits for her age and level of education. . His opinion was that her her mild cognitive deficits are amplified by her  concurrent anxiety disorder and recommended more aggressive psychological counseling, along with activities such as yoga and meditation. He did caution that her symptoms were increased risk for becoming permanent due to her age and prior traumatic brain injury.

## 2012-08-27 NOTE — Assessment & Plan Note (Signed)
Secondary to health issues and aggravated by recent concussion. She has been advised to followup closely with psychiatry for management. She's currently only taking alprazolam for management of symptoms.

## 2012-08-27 NOTE — Assessment & Plan Note (Signed)
She has recurrent episodes of spasm and pain. Her her MRI of cervical spine done October showed mild facet hypertrophy at C5 and trace anterior listhesis of C3 on C4. Her pain is improving with a prednisone taper and physical therapy.

## 2012-08-27 NOTE — Assessment & Plan Note (Signed)
With recent admission for chest pain evaluation. 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 She's currently taking omeprazole twice daily. She has not started the DEXA Lantus samples given to her by Dr. if Jerene Dilling after discharge because she is anxious to change in medications while on treatment for cervicalgia with prednisone may cause a reaction /.

## 2012-08-27 NOTE — Assessment & Plan Note (Addendum)
Because of her family history of ovarian cancer and colon cancer she had genetic  testing done prior to moving from [place]. She has been seeking closer surveillance for breast cancer and ovarian/cervical cancer then her current gynecologist is willing to do. She was told by the genetic counselor that she needed to have a Pap smear annually and  endometrial biopsy every 2 years but her current gynecologist refuses to do this. I will refer her to Dr. Alvie Heidelberg for second opinion. I have ordered her mammogram which is done annually by  [facility]. She has had an MRI of the breast in the recent past by [facility].

## 2012-08-29 DIAGNOSIS — F429 Obsessive-compulsive disorder, unspecified: Secondary | ICD-10-CM | POA: Diagnosis not present

## 2012-09-05 DIAGNOSIS — Z800 Family history of malignant neoplasm of digestive organs: Secondary | ICD-10-CM | POA: Diagnosis not present

## 2012-09-05 DIAGNOSIS — Z8041 Family history of malignant neoplasm of ovary: Secondary | ICD-10-CM | POA: Diagnosis not present

## 2012-09-05 DIAGNOSIS — F429 Obsessive-compulsive disorder, unspecified: Secondary | ICD-10-CM | POA: Diagnosis not present

## 2012-09-11 ENCOUNTER — Other Ambulatory Visit: Payer: Self-pay | Admitting: Internal Medicine

## 2012-09-11 NOTE — Telephone Encounter (Signed)
RX received electronically from pharmacy. Last office visit 08/25/12.

## 2012-09-11 NOTE — Telephone Encounter (Signed)
Ok to refill the alprazolam and alprazolam ER ,  Authorized in epic.  Call to pharmacy

## 2012-09-12 NOTE — Telephone Encounter (Signed)
[facility]

## 2012-09-15 ENCOUNTER — Encounter: Payer: Self-pay | Admitting: Adult Health

## 2012-09-15 ENCOUNTER — Ambulatory Visit (INDEPENDENT_AMBULATORY_CARE_PROVIDER_SITE_OTHER): Payer: Medicare Other | Admitting: Adult Health

## 2012-09-15 VITALS — BP 112/78 | HR 58 | Temp 98.0°F | Resp 16 | Wt 204.5 lb

## 2012-09-15 DIAGNOSIS — B354 Tinea corporis: Secondary | ICD-10-CM | POA: Diagnosis not present

## 2012-09-15 MED ORDER — TERBINAFINE HCL 1 % EX CREA: TOPICAL_CREAM | Freq: Two times a day (BID) | CUTANEOUS | Status: DC

## 2012-09-15 NOTE — Assessment & Plan Note (Signed)
Start Lamisil cream twice a day for 1 to 3 weeks. This may require additional time and I have instructed patient to continue the cream until the entire lesion is no longer visible. If symptoms worsen or do not improve she will call the office.

## 2012-09-15 NOTE — Patient Instructions (Addendum)
  You have ringworm. This is a fungal infection.   Start the lamicil cream twice a day for 1-3 weeks until you no longer see any evidence of irritation on the skin.  If you have any other areas that show up, apply the cream to those areas.

## 2012-09-15 NOTE — Progress Notes (Signed)
Subjective:    Patient ID: Tracey Wiley, female    DOB: 06-13-1955, 57 y.o.   MRN: [id number]  HPI  Patient is a pleasant 57 year old female who presents to clinic with an irritation under her left arm that she believes may be MRSA. 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 She is concerned that this may be the same thing. Patient first noticed it approximately one week ago. It is not painful, there is no drainage. She denies fever, chills or any other systemic symptom.   Current Outpatient Prescriptions on File Prior to Visit  Medication Sig Dispense Refill  . acetaminophen (TYLENOL) 500 MG tablet Take 500 mg by mouth daily as needed for pain.      Marland Kitchen albuterol (PROVENTIL HFA;VENTOLIN HFA) 108 (90 BASE) MCG/ACT inhaler Inhale 2 puffs into the lungs every 6 (six) hours as needed for wheezing.  1 Inhaler  0  . amoxicillin (AMOXIL) 500 MG capsule Take 2,000 mg by mouth. 1 hour prior to dental work       . aspirin EC 81 MG tablet Take 81 mg by mouth daily.      . celecoxib (CELEBREX) 200 MG capsule Take 200 mg by mouth as needed.      . chlorhexidine (PERIDEX) 0.12 % solution Use as directed 15 mLs in the mouth or throat 2 (two) times daily.  240 mL  0  . ergocalciferol (DRISDOL) 50000 UNITS capsule Take 1 capsule (50,000 Units total) by mouth once a week.  4 capsule  3  . ibuprofen (ADVIL,MOTRIN) 200 MG tablet Take 200 mg by mouth as needed.        . lidocaine (LIDODERM) 5 % Place 1 patch onto the skin daily. Remove & Discard patch within 12 hours or as directed by MD      . omeprazole (PRILOSEC) 20 MG capsule Take 40 mg by mouth daily.      . sotalol (BETAPACE) 80 MG tablet TAKE 1/2 TAB AT 9 A.M. AND 1 AND 1/2 TABS AT 9 P.M. AND 1/2 TAB AS NEEDED  225 tablet  3  . triamterene-hydrochlorothiazide (DYAZIDE) 37.5-25 MG per capsule Take 1 each (1 capsule total) by mouth every morning.  90 capsule  3  . VALTREX 1 G tablet TAKE 2 TABLETS BY MOUTH 3 TIMES A DAY AS NEEDED  15  tablet  3  . XANAX 0.5 MG tablet TAKE 1 TABLET BY MOUTH AT BEDTIME AS NEEDED  30 tablet  3  . XANAX XR 0.5 MG 24 hr tablet TAKE 1 TABLET BY MOUTH IN THE MORNING  30 tablet  3  . ZOCOR 40 MG tablet TAKE 1 TABLET BY MOUTH AT BEDTIME.  30 tablet  3   No current facility-administered medications on file prior to visit.    Review of Systems  Constitutional: Negative for fever and chills.  Skin: Positive for rash.  Neurological: Negative.       BP 112/78  Pulse 58  Temp(Src) 98 F (36.7 C) (Oral)  Resp 16  Wt 204 lb 8 oz (92.761 kg)  BMI 34.03 kg/m2  SpO2 96%     Objective:   Physical Exam  Constitutional: She is oriented to person, place, and time. She appears well-developed and well-nourished.  Pulmonary/Chest: Effort normal.  Neurological: She is alert and oriented to person, place, and time.  Skin: Skin is warm and dry. Rash noted.  Lesion on left armpit with inflammatory pattern at the edge of the skin lesion,  with scaling and redness. No blister formation.   Psychiatric: She has a normal mood and affect. Her behavior is normal. Thought content normal.          Assessment & Plan:

## 2012-09-19 DIAGNOSIS — F429 Obsessive-compulsive disorder, unspecified: Secondary | ICD-10-CM | POA: Diagnosis not present

## 2012-09-26 DIAGNOSIS — F429 Obsessive-compulsive disorder, unspecified: Secondary | ICD-10-CM | POA: Diagnosis not present

## 2012-09-27 DIAGNOSIS — D259 Leiomyoma of uterus, unspecified: Secondary | ICD-10-CM | POA: Diagnosis not present

## 2012-09-27 DIAGNOSIS — Z800 Family history of malignant neoplasm of digestive organs: Secondary | ICD-10-CM | POA: Diagnosis not present

## 2012-09-27 DIAGNOSIS — Z8041 Family history of malignant neoplasm of ovary: Secondary | ICD-10-CM | POA: Diagnosis not present

## 2012-09-27 DIAGNOSIS — N859 Noninflammatory disorder of uterus, unspecified: Secondary | ICD-10-CM | POA: Diagnosis not present

## 2012-09-27 DIAGNOSIS — Z809 Family history of malignant neoplasm, unspecified: Secondary | ICD-10-CM | POA: Diagnosis not present

## 2012-10-04 DIAGNOSIS — F429 Obsessive-compulsive disorder, unspecified: Secondary | ICD-10-CM | POA: Diagnosis not present

## 2012-10-10 DIAGNOSIS — F429 Obsessive-compulsive disorder, unspecified: Secondary | ICD-10-CM | POA: Diagnosis not present

## 2012-10-17 DIAGNOSIS — F429 Obsessive-compulsive disorder, unspecified: Secondary | ICD-10-CM | POA: Diagnosis not present

## 2012-10-17 DIAGNOSIS — Z1231 Encounter for screening mammogram for malignant neoplasm of breast: Secondary | ICD-10-CM | POA: Diagnosis not present

## 2012-10-24 DIAGNOSIS — N958 Other specified menopausal and perimenopausal disorders: Secondary | ICD-10-CM | POA: Diagnosis not present

## 2012-10-24 DIAGNOSIS — N951 Menopausal and female climacteric states: Secondary | ICD-10-CM | POA: Diagnosis not present

## 2012-10-24 DIAGNOSIS — F429 Obsessive-compulsive disorder, unspecified: Secondary | ICD-10-CM | POA: Diagnosis not present

## 2012-10-24 DIAGNOSIS — E2839 Other primary ovarian failure: Secondary | ICD-10-CM | POA: Diagnosis not present

## 2012-10-24 DIAGNOSIS — Z13820 Encounter for screening for osteoporosis: Secondary | ICD-10-CM | POA: Diagnosis not present

## 2012-10-26 ENCOUNTER — Telehealth: Payer: Self-pay | Admitting: Internal Medicine

## 2012-10-26 NOTE — Telephone Encounter (Signed)
Her  DEXA scan  showed very strong bones !!!! No signs of bone loss .  We will repeat at age 57

## 2012-10-31 DIAGNOSIS — F429 Obsessive-compulsive disorder, unspecified: Secondary | ICD-10-CM | POA: Diagnosis not present

## 2012-10-31 NOTE — Telephone Encounter (Signed)
Left message for patient to return call to office. 

## 2012-11-03 NOTE — Telephone Encounter (Signed)
Yes, she should contineu to get 1200 mg calcium daily through diet and supplements ,  And continue weight bearing exercises

## 2012-11-03 NOTE — Telephone Encounter (Signed)
Patient sated she was diagnosed previously with osteopenia in right arm should she continue calcium and exercise for this?

## 2012-11-06 DIAGNOSIS — H269 Unspecified cataract: Secondary | ICD-10-CM | POA: Diagnosis not present

## 2012-11-06 NOTE — Telephone Encounter (Signed)
Patient notified as requested. 

## 2012-11-09 ENCOUNTER — Other Ambulatory Visit: Payer: Self-pay | Admitting: *Deleted

## 2012-11-09 MED ORDER — CHLORHEXIDINE GLUCONATE 0.12 % MT SOLN: 15.0000 mL | Freq: Two times a day (BID) | OROMUCOSAL | Status: DC

## 2012-11-21 DIAGNOSIS — F429 Obsessive-compulsive disorder, unspecified: Secondary | ICD-10-CM | POA: Diagnosis not present

## 2012-11-27 DIAGNOSIS — R1013 Epigastric pain: Secondary | ICD-10-CM | POA: Diagnosis not present

## 2012-11-27 DIAGNOSIS — R109 Unspecified abdominal pain: Secondary | ICD-10-CM | POA: Diagnosis not present

## 2012-11-27 DIAGNOSIS — K219 Gastro-esophageal reflux disease without esophagitis: Secondary | ICD-10-CM | POA: Diagnosis not present

## 2012-11-28 DIAGNOSIS — F429 Obsessive-compulsive disorder, unspecified: Secondary | ICD-10-CM | POA: Diagnosis not present

## 2012-12-04 ENCOUNTER — Telehealth: Payer: Self-pay | Admitting: *Deleted

## 2012-12-04 ENCOUNTER — Ambulatory Visit (INDEPENDENT_AMBULATORY_CARE_PROVIDER_SITE_OTHER): Payer: Medicare Other | Admitting: Internal Medicine

## 2012-12-04 ENCOUNTER — Encounter: Payer: Self-pay | Admitting: Internal Medicine

## 2012-12-04 VITALS — BP 120/72 | HR 56 | Temp 97.8°F | Resp 14 | Wt 208.0 lb

## 2012-12-04 DIAGNOSIS — R5381 Other malaise: Secondary | ICD-10-CM

## 2012-12-04 DIAGNOSIS — E669 Obesity, unspecified: Secondary | ICD-10-CM | POA: Diagnosis not present

## 2012-12-04 DIAGNOSIS — Z79899 Other long term (current) drug therapy: Secondary | ICD-10-CM

## 2012-12-04 DIAGNOSIS — M545 Low back pain, unspecified: Secondary | ICD-10-CM | POA: Diagnosis not present

## 2012-12-04 DIAGNOSIS — R350 Frequency of micturition: Secondary | ICD-10-CM | POA: Diagnosis not present

## 2012-12-04 DIAGNOSIS — E785 Hyperlipidemia, unspecified: Secondary | ICD-10-CM

## 2012-12-04 DIAGNOSIS — M533 Sacrococcygeal disorders, not elsewhere classified: Secondary | ICD-10-CM | POA: Diagnosis not present

## 2012-12-04 DIAGNOSIS — E538 Deficiency of other specified B group vitamins: Secondary | ICD-10-CM

## 2012-12-04 DIAGNOSIS — K2100 Gastro-esophageal reflux disease with esophagitis, without bleeding: Secondary | ICD-10-CM

## 2012-12-04 DIAGNOSIS — D649 Anemia, unspecified: Secondary | ICD-10-CM

## 2012-12-04 DIAGNOSIS — R5383 Other fatigue: Secondary | ICD-10-CM

## 2012-12-04 DIAGNOSIS — R911 Solitary pulmonary nodule: Secondary | ICD-10-CM

## 2012-12-04 LAB — URINALYSIS
Nitrite: NEGATIVE
Specific Gravity, Urine: <=1.005 (ref 1.000–1.030)
Total Protein, Urine: NEGATIVE
pH: 7 (ref 5.0–8.0)

## 2012-12-04 MED ORDER — HYOSCYAMINE SULFATE 0.125 MG SL SUBL
0.1250 mg | SUBLINGUAL_TABLET | SUBLINGUAL | Status: DC | PRN
Start: 2012-12-04 — End: 2014-01-05

## 2012-12-04 NOTE — Patient Instructions (Addendum)
Return for fasting labs   Referral to Dr Renae Fickle,  endocrinologist with [facility].   Referral to Dr Servando Snare for persistent esophagitis   Trial of antispasmodic hysocyamine,  Place under tongue as needed for spasm   CT scan of chest with premedication  (after you see Dr Renae Fickle)

## 2012-12-04 NOTE — Telephone Encounter (Signed)
Faxed order to [facility] medical supply for Tens unit for patient called and notified patient order faxed.
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
## 2012-12-05 ENCOUNTER — Telehealth: Payer: Self-pay | Admitting: Internal Medicine

## 2012-12-05 ENCOUNTER — Encounter: Payer: Self-pay | Admitting: Internal Medicine

## 2012-12-05 DIAGNOSIS — R911 Solitary pulmonary nodule: Secondary | ICD-10-CM | POA: Insufficient documentation

## 2012-12-05 DIAGNOSIS — E669 Obesity, unspecified: Secondary | ICD-10-CM | POA: Insufficient documentation

## 2012-12-05 NOTE — Assessment & Plan Note (Signed)
She is overdue for follow up chest CT byut will need premedication with prednisone, which will interfere with any endocrine evaluation planned., we will postpone until after her endocrine evaluation

## 2012-12-05 NOTE — Assessment & Plan Note (Addendum)
I have addressed  BMI and recommended a low glycemic index diet utilizing smaller more frequent meals to increase metabolism.  I have also recommended that patient continue exercising with a goal of 30 minutes of aerobic exercise a minimum of 5 days per week. Screening for lipid disorders, thyroid and diabetes to be done today.   Cortisol and thryoid function pending  Referral to Endocrinology as requested.

## 2012-12-05 NOTE — Telephone Encounter (Signed)
They do not sell TENS units anymore according to [facility] at [facility] . She did inform me that the patient can go on line to get one for 50.00 or less.

## 2012-12-05 NOTE — Assessment & Plan Note (Signed)
Trial of hyoscyamine.  Referral to [name] for further evaluation

## 2012-12-07 NOTE — Telephone Encounter (Signed)
Notified patient and patient stated she will check [employer].

## 2012-12-10 ENCOUNTER — Other Ambulatory Visit: Payer: Self-pay | Admitting: Cardiovascular Disease

## 2012-12-11 ENCOUNTER — Encounter: Payer: Self-pay | Admitting: Cardiovascular Disease

## 2012-12-11 ENCOUNTER — Other Ambulatory Visit: Payer: Self-pay | Admitting: *Deleted

## 2012-12-11 ENCOUNTER — Ambulatory Visit (INDEPENDENT_AMBULATORY_CARE_PROVIDER_SITE_OTHER): Payer: Medicare Other | Admitting: Cardiovascular Disease

## 2012-12-11 ENCOUNTER — Ambulatory Visit: Payer: Medicare Other | Admitting: Cardiovascular Disease

## 2012-12-11 VITALS — BP 142/88 | HR 53 | Ht 65.0 in | Wt 206.0 lb

## 2012-12-11 DIAGNOSIS — R0602 Shortness of breath: Secondary | ICD-10-CM

## 2012-12-11 DIAGNOSIS — I471 Supraventricular tachycardia: Secondary | ICD-10-CM

## 2012-12-11 DIAGNOSIS — E785 Hyperlipidemia, unspecified: Secondary | ICD-10-CM

## 2012-12-11 DIAGNOSIS — E669 Obesity, unspecified: Secondary | ICD-10-CM

## 2012-12-11 DIAGNOSIS — R079 Chest pain, unspecified: Secondary | ICD-10-CM | POA: Insufficient documentation

## 2012-12-11 MED ORDER — SIMVASTATIN 40 MG PO TABS: ORAL_TABLET | ORAL | Status: DC

## 2012-12-11 MED ORDER — NITROGLYCERIN 0.4 MG SL SUBL: 0.4000 mg | SUBLINGUAL_TABLET | SUBLINGUAL | Status: DC | PRN

## 2012-12-11 NOTE — Progress Notes (Signed)
Patient ID: Tracey Wiley, female    DOB: 16-May-1956, 57 y.o.   MRN: [id number]  HPI Comments: Ms. Fahey is a very pleasant 57 year old woman with a  history of PFO,  with  closure device in April 2008 performed in [place], hyperlipidemia, PAT versus paroxysmal atrial fibrillation on sotalol, hypertension who presents for routine followup.   She states that she is taking sotalol one half pill 4 times a day  with rare episodes of tachycardia or palpitations. Taking this twice a day caused fatigue.  She had a recent hospital admission in March of 2014 for chest pain. She had workup including CT scan, cardiac rule out. It was felt she had noncardiac chest pain, possible esophageal spasm or GERD-related symptoms. She continues to have symptoms, mild improved on Carafate daily. She has followup with GI in the near future. She continues to have some difficulty swallowing. She has periods of sharp stabbing pain in her chest typically that come on after bending down and then standing back out. Most of her symptoms come on at rest, not typically with exertion.  She has rare episodes of vertigo, sometimes brought on by deep breathing. She is very active at baseline with no symptoms of chest pain or shortness of breath.   EKG shows normal sinus rhythm with rate 53 beats per minute, no significant ST or T wave changes  .   Outpatient Encounter Prescriptions as of 12/11/2012  Medication Sig Dispense Refill  . acetaminophen (TYLENOL) 500 MG tablet Take 500 mg by mouth daily as needed for pain.      Marland Kitchen albuterol (PROVENTIL HFA;VENTOLIN HFA) 108 (90 BASE) MCG/ACT inhaler Inhale 2 puffs into the lungs every 6 (six) hours as needed for wheezing.  1 Inhaler  0  . amoxicillin (AMOXIL) 500 MG capsule Take 2,000 mg by mouth. 1 hour prior to dental work       . aspirin EC 81 MG tablet Take 81 mg by mouth daily.      . celecoxib (CELEBREX) 200 MG capsule Take 200 mg by mouth as needed.      . chlorhexidine  (PERIDEX) 0.12 % solution Use as directed 15 mLs in the mouth or throat 2 (two) times daily.  240 mL  0  . esomeprazole (NEXIUM) 40 MG capsule Take 40 mg by mouth daily before breakfast.      . hyoscyamine (LEVSIN SL) 0.125 MG SL tablet Place 1 tablet (0.125 mg total) under the tongue every 4 (four) hours as needed for cramping.  60 tablet  0  . ibuprofen (ADVIL,MOTRIN) 200 MG tablet Take 200 mg by mouth as needed.        . lidocaine (LIDODERM) 5 % Place 1 patch onto the skin daily. Remove & Discard patch within 12 hours or as directed by MD      . simvastatin (ZOCOR) 40 MG tablet TAKE 1 TABLET BY MOUTH AT BEDTIME.  30 tablet  3  . sotalol (BETAPACE) 80 MG tablet TAKE 1/2 TAB AT 9 A.M. AND 1 AND 1/2 TABS AT 9 P.M. AND 1/2 TAB AS NEEDED  225 tablet  3  . sucralfate (CARAFATE) 1 GM/10ML suspension Take 1 g by mouth 4 (four) times daily.      Marland Kitchen triamterene-hydrochlorothiazide (DYAZIDE) 37.5-25 MG per capsule Take 1 each (1 capsule total) by mouth every morning.  90 capsule  3  . VALTREX 1 G tablet TAKE 2 TABLETS BY MOUTH 3 TIMES A DAY AS NEEDED  15 tablet  3  . XANAX XR 0.5 MG 24 hr tablet TAKE 1 TABLET BY MOUTH IN THE MORNING  30 tablet  3     Review of Systems  Constitutional: Negative.   HENT: Negative.   Eyes: Negative.   Respiratory: Negative.   Cardiovascular: Positive for chest pain.  Gastrointestinal: Negative.   Musculoskeletal: Negative.   Skin: Negative.   Neurological: Negative.   Psychiatric/Behavioral: Negative.   All other systems reviewed and are negative.   BP 142/88  Pulse 53  Ht 5\' 5"  (1.651 m)  Wt 206 lb (93.441 kg)  BMI 34.28 kg/m2  Physical Exam  Nursing note and vitals reviewed. Constitutional: She is oriented to person, place, and time. She appears well-developed and well-nourished.  Obese  HENT:  Head: Normocephalic.  Nose: Nose normal.  Mouth/Throat: Oropharynx is clear and moist.  Eyes: Conjunctivae are normal. Pupils are equal, round, and reactive to  light.  Neck: Normal range of motion. Neck supple. No JVD present.  Cardiovascular: Normal rate, regular rhythm, S1 normal, S2 normal, normal heart sounds and intact distal pulses.  Exam reveals no gallop and no friction rub.   No murmur heard. Pulmonary/Chest: Effort normal and breath sounds normal. No respiratory distress. She has no wheezes. She has no rales. She exhibits no tenderness.  Abdominal: Soft. Bowel sounds are normal. She exhibits no distension. There is no tenderness.  Musculoskeletal: Normal range of motion. She exhibits no edema and no tenderness.  Lymphadenopathy:    She has no cervical adenopathy.  Neurological: She is alert and oriented to person, place, and time. Coordination normal.  Skin: Skin is warm and dry. No rash noted. No erythema.  Psychiatric: She has a normal mood and affect. Her behavior is normal. Judgment and thought content normal.    Assessment and Plan

## 2012-12-11 NOTE — Assessment & Plan Note (Signed)
No recent palpitations or tachycardia. She will try sotalol one half pill 3 times a day down from 4 times a day. She is bradycardic this afternoon with fatigue.

## 2012-12-11 NOTE — Assessment & Plan Note (Signed)
She is scheduled for lab work with Dr. Darrick Huntsman.

## 2012-12-11 NOTE — Patient Instructions (Addendum)
Consider decreasing sotolol to 1/2 pill three times a day  For spasm, try levsin SL We can also try  Nitro SL (could cut in 1/2)  If nitro helps your pain, call the office We would try isosorbide (6 hr and a 24 hr)  Other options include adding amlodipine for spasm (Calcium channel blocker) We would change triamtere HCTZ to generic HCTZ (12.g mg and 25 mg)  Please call us if you have new issues that need to be addressed before your next appt.  Your physician wants you to follow-up in: 6 months.  You will receive a reminder letter in the mail two months in advance. If you don't receive a letter, please call our office to schedule the follow-up appointment.

## 2012-12-11 NOTE — Telephone Encounter (Signed)
Refilled Zocor sent to [facility] pharmacy.

## 2012-12-11 NOTE — Assessment & Plan Note (Addendum)
Medical records were reviewed from March 2014 when she was in the hospital. CT scan reviewed . Etiology is not clear, though concerning for GI etiology. She reports having a history of hiatal hernia.  We have suggested she has symptoms that do not resolve, she try her Levsin. If this does not work, we will prescribe nitroglycerin sublingual for possible esophageal spasm. If nitroglycerin does help, long-acting nitroglycerin could be used. We also discussed changing her triamterene HCTZ to amlodipine for possible spasm. She can take HCTZ as needed for edema or for her ears. No further cardiac testing at this time.

## 2012-12-11 NOTE — Assessment & Plan Note (Signed)
We have encouraged continued exercise, careful diet management in an effort to lose weight. 

## 2012-12-12 DIAGNOSIS — F429 Obsessive-compulsive disorder, unspecified: Secondary | ICD-10-CM | POA: Diagnosis not present

## 2012-12-13 ENCOUNTER — Other Ambulatory Visit: Payer: Medicare Other

## 2012-12-13 DIAGNOSIS — R10811 Right upper quadrant abdominal tenderness: Secondary | ICD-10-CM | POA: Diagnosis not present

## 2012-12-13 DIAGNOSIS — R1011 Right upper quadrant pain: Secondary | ICD-10-CM | POA: Diagnosis not present

## 2012-12-14 DIAGNOSIS — L738 Other specified follicular disorders: Secondary | ICD-10-CM | POA: Diagnosis not present

## 2012-12-18 ENCOUNTER — Encounter: Payer: Self-pay | Admitting: *Deleted

## 2012-12-19 ENCOUNTER — Other Ambulatory Visit (INDEPENDENT_AMBULATORY_CARE_PROVIDER_SITE_OTHER): Payer: Medicare Other

## 2012-12-19 DIAGNOSIS — E538 Deficiency of other specified B group vitamins: Secondary | ICD-10-CM

## 2012-12-19 DIAGNOSIS — R5383 Other fatigue: Secondary | ICD-10-CM

## 2012-12-19 DIAGNOSIS — R5381 Other malaise: Secondary | ICD-10-CM | POA: Diagnosis not present

## 2012-12-19 DIAGNOSIS — Z79899 Other long term (current) drug therapy: Secondary | ICD-10-CM | POA: Diagnosis not present

## 2012-12-19 DIAGNOSIS — D497 Neoplasm of unspecified behavior of endocrine glands and other parts of nervous system: Secondary | ICD-10-CM

## 2012-12-19 DIAGNOSIS — E785 Hyperlipidemia, unspecified: Secondary | ICD-10-CM

## 2012-12-19 DIAGNOSIS — D649 Anemia, unspecified: Secondary | ICD-10-CM | POA: Diagnosis not present

## 2012-12-19 DIAGNOSIS — E669 Obesity, unspecified: Secondary | ICD-10-CM | POA: Diagnosis not present

## 2012-12-19 LAB — COMPREHENSIVE METABOLIC PANEL
ALT: 19 U/L (ref 0–35)
AST: 24 U/L (ref 0–37)
Alkaline Phosphatase: 61 U/L (ref 39–117)
CO2: 28 mEq/L (ref 19–32)
Creatinine, Ser: 0.8 mg/dL (ref 0.4–1.2)
Sodium: 137 mEq/L (ref 135–145)
Total Bilirubin: 0.5 mg/dL (ref 0.3–1.2)
Total Protein: 7.2 g/dL (ref 6.0–8.3)

## 2012-12-19 LAB — LIPID PANEL
Cholesterol: 163 mg/dL (ref 0–200)
HDL: 41.1 mg/dL (ref >39.00)
LDL Cholesterol: 97 mg/dL (ref 0–99)
Total CHOL/HDL Ratio: 4
Triglycerides: 125 mg/dL (ref 0.0–149.0)
VLDL: 25 mg/dL (ref 0.0–40.0)

## 2012-12-19 LAB — VITAMIN B12: Vitamin B-12: >1500 pg/mL — ABNORMAL HIGH (ref 211–911)

## 2012-12-20 LAB — TSH+FREE T4
Free T4: 1.38 ng/dL (ref 0.82–1.77)
TSH: 2.03 u[IU]/mL (ref 0.450–4.500)

## 2012-12-20 NOTE — Addendum Note (Signed)
Addended by: Sherlene Shams on: 12/20/2012 10:43 PM   Modules accepted: Orders

## 2012-12-21 ENCOUNTER — Encounter: Payer: Self-pay | Admitting: Emergency Medicine

## 2012-12-21 ENCOUNTER — Encounter: Payer: Self-pay | Admitting: *Deleted

## 2012-12-26 ENCOUNTER — Other Ambulatory Visit: Payer: Self-pay | Admitting: Internal Medicine

## 2012-12-26 ENCOUNTER — Telehealth: Payer: Self-pay | Admitting: Internal Medicine

## 2012-12-26 DIAGNOSIS — K219 Gastro-esophageal reflux disease without esophagitis: Secondary | ICD-10-CM

## 2012-12-26 DIAGNOSIS — Z1211 Encounter for screening for malignant neoplasm of colon: Secondary | ICD-10-CM

## 2012-12-26 NOTE — Telephone Encounter (Signed)
LVM on patients cell to return my call.

## 2012-12-26 NOTE — Telephone Encounter (Signed)
Pt says she has a question about an appointment that has been made for her, and about another one that will be scheduled for her (did not share any specific info with me).  She states that Dr. Darrick Huntsman told her two doctors that she would be seeing and the name she received in a letter you sent is not one of the names Dr. Darrick Huntsman had given her.  She would like to find out who this doctor is, why it is this doctor, and where the appointment is located.  If you are not able to reach her on her home phone [phone], call her cell [phone] (per pt - this # is not to be made part of her record - it is just for this one phone call if needed).

## 2012-12-26 NOTE — Telephone Encounter (Signed)
Patient is aware of the apt with Gherghe. She is wondering about her GI referral to Dr. Servando Snare. Please advise as there is no referral.

## 2013-01-02 ENCOUNTER — Other Ambulatory Visit: Payer: Self-pay | Admitting: Cardiovascular Disease

## 2013-01-03 ENCOUNTER — Other Ambulatory Visit: Payer: Self-pay | Admitting: *Deleted

## 2013-01-03 MED ORDER — TRIAMTERENE-HCTZ 37.5-25 MG PO CAPS: 1.0000 | ORAL_CAPSULE | ORAL | Status: DC

## 2013-01-03 NOTE — Telephone Encounter (Signed)
Refilled Dyazide sent to [facility] pharmacy.

## 2013-01-08 DIAGNOSIS — R1310 Dysphagia, unspecified: Secondary | ICD-10-CM | POA: Diagnosis not present

## 2013-01-08 DIAGNOSIS — Z800 Family history of malignant neoplasm of digestive organs: Secondary | ICD-10-CM | POA: Diagnosis not present

## 2013-01-08 DIAGNOSIS — K219 Gastro-esophageal reflux disease without esophagitis: Secondary | ICD-10-CM | POA: Diagnosis not present

## 2013-01-08 DIAGNOSIS — R0789 Other chest pain: Secondary | ICD-10-CM | POA: Diagnosis not present

## 2013-01-09 DIAGNOSIS — F429 Obsessive-compulsive disorder, unspecified: Secondary | ICD-10-CM | POA: Diagnosis not present

## 2013-01-23 DIAGNOSIS — F429 Obsessive-compulsive disorder, unspecified: Secondary | ICD-10-CM | POA: Diagnosis not present

## 2013-01-30 DIAGNOSIS — F429 Obsessive-compulsive disorder, unspecified: Secondary | ICD-10-CM | POA: Diagnosis not present

## 2013-02-05 LAB — HM PAP SMEAR: HM Pap smear: NORMAL

## 2013-02-06 DIAGNOSIS — F429 Obsessive-compulsive disorder, unspecified: Secondary | ICD-10-CM | POA: Diagnosis not present

## 2013-02-07 ENCOUNTER — Ambulatory Visit: Payer: Medicare Other | Admitting: Internal Medicine

## 2013-02-07 ENCOUNTER — Ambulatory Visit (INDEPENDENT_AMBULATORY_CARE_PROVIDER_SITE_OTHER): Payer: Medicare Other | Admitting: Internal Medicine

## 2013-02-07 ENCOUNTER — Encounter: Payer: Self-pay | Admitting: Internal Medicine

## 2013-02-07 VITALS — BP 120/72 | HR 72 | Temp 98.2°F | Resp 12 | Ht 65.0 in | Wt 207.0 lb

## 2013-02-07 DIAGNOSIS — E041 Nontoxic single thyroid nodule: Secondary | ICD-10-CM

## 2013-02-07 DIAGNOSIS — E236 Other disorders of pituitary gland: Secondary | ICD-10-CM | POA: Diagnosis not present

## 2013-02-07 DIAGNOSIS — E669 Obesity, unspecified: Secondary | ICD-10-CM
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
## 2013-02-07 NOTE — Patient Instructions (Signed)
Please return in 3 months - let's set a weight loss goal of 10 lbs.  Please consider the following ways to cut down carbs and fat and increase fiber and micronutrients in your diet: - substitute whole grain for white bread or pasta - substitute brown rice for white rice - substitute 90-calorie flat bread pieces for slices of bread when possible - substitute sweet potatoes or yams for white potatoes - substitute humus for margarine - substitute tofu for cheese when possible - substitute almond or rice milk for regular milk (would not drink soy milk daily due to concern for soy estrogen influence on breast cancer risk) - substitute dark chocolate for other sweets when possible - substitute water - can add lemon or orange slices for taste - for diet sodas (artificial sweeteners will trick your body that you can eat sweets without getting calories and will lead you to overeating and weight gain in the long run) - do not skip breakfast or other meals (this will slow down the metabolism and will result in more weight gain over time)  - can try smoothies made from fruit and almond/rice milk in am instead of regular breakfast - can also try old-fashioned (not instant) oatmeal made with almond/rice milk in am - order the dressing on the side when eating salad at a restaurant (pour less than half of the dressing on the salad) - eat as little meat as possible - can try juicing, but should not forget that juicing will get rid of the fiber, so would alternate with eating raw veg./fruits or drinking smoothies - use as little oil as possible, even when using olive oil - can dress a salad with a mix of balsamic vinegar and lemon juice, for e.g. - use agave nectar, stevia sugar, or regular sugar rather than artificial sweateners - steam or broil/roast veggies  - snack on veggies/fruit/nuts (unsalted, preferably) when possible, rather than processed foods - reduce or eliminate aspartame in diet (it is in diet  sodas, chewing gum, etc) Read the labels!  Try to read Dr. Katherina Right book: "Program for Reversing Diabetes" for the vegan concept and other ideas for healthy eating.  Plant-based diet materials:  - Lectures (you tube):  Lequita Asal: "Breaking the Food Seduction"  Doug Lisle: "How to Lose Weight, without Losing Your Mind"  Lucile Crater: "What is Insulin Resistance" [employer] - Documentaries:  Supersize Me  [employer]  Forks over [employer], Sick and Nearly Dead  [employer] the [employer] - Books:  Lequita Asal: "Program for Reversing Diabetes"  Ferol Luz: "The [place] Study"  Konrad Penta: "Supermarket Vegan" (cookbook) - Facebook pages:   Reece Agar versus Knives  Vegucated  [employer] Magazine  Food Matters - Healthy nutrition info websites:  LateTelevision.com.ee

## 2013-02-09 DIAGNOSIS — R21 Rash and other nonspecific skin eruption: Secondary | ICD-10-CM | POA: Diagnosis not present

## 2013-02-09 DIAGNOSIS — L732 Hidradenitis suppurativa: Secondary | ICD-10-CM | POA: Diagnosis not present

## 2013-02-09 DIAGNOSIS — L0291 Cutaneous abscess, unspecified: Secondary | ICD-10-CM | POA: Diagnosis not present

## 2013-02-20 ENCOUNTER — Encounter: Payer: Self-pay | Admitting: Internal Medicine

## 2013-02-20 ENCOUNTER — Ambulatory Visit: Payer: Self-pay | Admitting: Gastroenterology

## 2013-02-20 DIAGNOSIS — K2100 Gastro-esophageal reflux disease with esophagitis, without bleeding: Secondary | ICD-10-CM | POA: Diagnosis not present

## 2013-02-20 DIAGNOSIS — Z888 Allergy status to other drugs, medicaments and biological substances status: Secondary | ICD-10-CM | POA: Diagnosis not present

## 2013-02-20 DIAGNOSIS — R1013 Epigastric pain: Secondary | ICD-10-CM | POA: Diagnosis not present

## 2013-02-20 DIAGNOSIS — Z8249 Family history of ischemic heart disease and other diseases of the circulatory system: Secondary | ICD-10-CM | POA: Diagnosis not present

## 2013-02-20 DIAGNOSIS — Z87820 Personal history of traumatic brain injury: Secondary | ICD-10-CM | POA: Diagnosis not present

## 2013-02-20 DIAGNOSIS — K2940 Chronic atrophic gastritis without bleeding: Secondary | ICD-10-CM | POA: Diagnosis not present

## 2013-02-20 DIAGNOSIS — D649 Anemia, unspecified: Secondary | ICD-10-CM | POA: Diagnosis not present

## 2013-02-20 DIAGNOSIS — M129 Arthropathy, unspecified: Secondary | ICD-10-CM | POA: Diagnosis not present

## 2013-02-20 DIAGNOSIS — Z800 Family history of malignant neoplasm of digestive organs: Secondary | ICD-10-CM | POA: Diagnosis not present

## 2013-02-20 DIAGNOSIS — I10 Essential (primary) hypertension: Secondary | ICD-10-CM | POA: Diagnosis not present

## 2013-02-20 DIAGNOSIS — K2970 Gastritis, unspecified, without bleeding: Secondary | ICD-10-CM | POA: Diagnosis not present

## 2013-02-20 DIAGNOSIS — E785 Hyperlipidemia, unspecified: Secondary | ICD-10-CM | POA: Diagnosis not present

## 2013-02-20 DIAGNOSIS — K31819 Angiodysplasia of stomach and duodenum without bleeding: Secondary | ICD-10-CM | POA: Diagnosis not present

## 2013-02-20 DIAGNOSIS — R1310 Dysphagia, unspecified: Secondary | ICD-10-CM | POA: Diagnosis not present

## 2013-02-20 DIAGNOSIS — Z79899 Other long term (current) drug therapy: Secondary | ICD-10-CM | POA: Diagnosis not present

## 2013-02-20 DIAGNOSIS — E669 Obesity, unspecified: Secondary | ICD-10-CM | POA: Diagnosis not present

## 2013-02-23 ENCOUNTER — Ambulatory Visit (HOSPITAL_COMMUNITY): Payer: Medicare Other

## 2013-02-23 ENCOUNTER — Inpatient Hospital Stay (HOSPITAL_COMMUNITY): Admission: RE | Admit: 2013-02-23 | Payer: Medicare Other | Source: Ambulatory Visit

## 2013-02-27 DIAGNOSIS — F429 Obsessive-compulsive disorder, unspecified: Secondary | ICD-10-CM | POA: Diagnosis not present

## 2013-02-27 DIAGNOSIS — Z8742 Personal history of other diseases of the female genital tract: Secondary | ICD-10-CM | POA: Diagnosis not present

## 2013-02-27 DIAGNOSIS — Z113 Encounter for screening for infections with a predominantly sexual mode of transmission: Secondary | ICD-10-CM | POA: Diagnosis not present

## 2013-02-27 DIAGNOSIS — Z01419 Encounter for gynecological examination (general) (routine) without abnormal findings: Secondary | ICD-10-CM | POA: Diagnosis not present

## 2013-02-27 DIAGNOSIS — Z124 Encounter for screening for malignant neoplasm of cervix: Secondary | ICD-10-CM | POA: Diagnosis not present

## 2013-02-28 DIAGNOSIS — K208 Other esophagitis without bleeding: Secondary | ICD-10-CM | POA: Diagnosis not present

## 2013-02-28 DIAGNOSIS — Z800 Family history of malignant neoplasm of digestive organs: Secondary | ICD-10-CM | POA: Diagnosis not present

## 2013-03-01 ENCOUNTER — Encounter: Payer: Self-pay | Admitting: Internal Medicine

## 2013-03-13 ENCOUNTER — Ambulatory Visit (INDEPENDENT_AMBULATORY_CARE_PROVIDER_SITE_OTHER): Payer: Medicare Other

## 2013-03-13 DIAGNOSIS — Z23 Encounter for immunization: Secondary | ICD-10-CM | POA: Diagnosis not present

## 2013-03-20 ENCOUNTER — Ambulatory Visit: Payer: Self-pay | Admitting: Surgery

## 2013-03-20 DIAGNOSIS — Z01810 Encounter for preprocedural cardiovascular examination: Secondary | ICD-10-CM | POA: Diagnosis not present

## 2013-03-20 DIAGNOSIS — F429 Obsessive-compulsive disorder, unspecified: Secondary | ICD-10-CM | POA: Diagnosis not present

## 2013-03-20 DIAGNOSIS — R229 Localized swelling, mass and lump, unspecified: Secondary | ICD-10-CM | POA: Diagnosis not present

## 2013-03-20 DIAGNOSIS — L723 Sebaceous cyst: Secondary | ICD-10-CM | POA: Diagnosis not present

## 2013-03-20 LAB — COMPREHENSIVE METABOLIC PANEL
Albumin: 3.5 g/dL (ref 3.4–5.0)
Anion Gap: 1 — ABNORMAL LOW (ref 7–16)
Bilirubin,Total: 0.3 mg/dL (ref 0.2–1.0)
Co2: 33 mmol/L — ABNORMAL HIGH (ref 21–32)
EGFR (Non-African Amer.): >60
Glucose: 99 mg/dL (ref 65–99)
Osmolality: 275 (ref 275–301)
SGPT (ALT): 26 U/L (ref 12–78)
Sodium: 137 mmol/L (ref 136–145)

## 2013-03-20 LAB — CBC WITH DIFFERENTIAL/PLATELET
Eosinophil #: 0.3 10*3/uL (ref 0.0–0.7)
Eosinophil %: 3.2 %
HGB: 13.5 g/dL (ref 12.0–16.0)
Lymphocyte %: 34.5 %
MCH: 29.4 pg (ref 26.0–34.0)
MCHC: 35 g/dL (ref 32.0–36.0)
Monocyte #: 0.7 x10 3/mm (ref 0.2–0.9)
Monocyte %: 7.3 %
Neutrophil #: 4.9 10*3/uL (ref 1.4–6.5)
Platelet: 280 10*3/uL (ref 150–440)
WBC: 9 10*3/uL (ref 3.6–11.0)

## 2013-03-22 ENCOUNTER — Ambulatory Visit: Payer: Self-pay | Admitting: Surgery

## 2013-03-22 DIAGNOSIS — L738 Other specified follicular disorders: Secondary | ICD-10-CM | POA: Diagnosis not present

## 2013-03-22 DIAGNOSIS — L723 Sebaceous cyst: Secondary | ICD-10-CM | POA: Diagnosis not present

## 2013-03-22 DIAGNOSIS — R229 Localized swelling, mass and lump, unspecified: Secondary | ICD-10-CM | POA: Diagnosis not present

## 2013-03-22 DIAGNOSIS — N9489 Other specified conditions associated with female genital organs and menstrual cycle: Secondary | ICD-10-CM | POA: Diagnosis not present

## 2013-03-30 ENCOUNTER — Ambulatory Visit (HOSPITAL_COMMUNITY): Admission: RE | Admit: 2013-03-30 | Payer: Medicare Other | Source: Ambulatory Visit

## 2013-04-03 DIAGNOSIS — F429 Obsessive-compulsive disorder, unspecified: Secondary | ICD-10-CM | POA: Diagnosis not present

## 2013-04-04 DIAGNOSIS — L723 Sebaceous cyst: Secondary | ICD-10-CM | POA: Diagnosis not present

## 2013-04-24 DIAGNOSIS — F429 Obsessive-compulsive disorder, unspecified: Secondary | ICD-10-CM | POA: Diagnosis not present

## 2013-05-02 ENCOUNTER — Telehealth (INDEPENDENT_AMBULATORY_CARE_PROVIDER_SITE_OTHER): Payer: Self-pay

## 2013-05-02 NOTE — Telephone Encounter (Signed)
Message copied by Ethlyn Gallery on Wed May 02, 2013 10:44 AM ------      Message from: Larry Sierras      Created: Wed May 02, 2013 10:28 AM      Regarding: APPT      Contact: [phone]       PT NEEDS GUIDANCE FOR POSS LYMPHADENOPATHY?/NEED DETAIL FROM PT WHEN YOU CALL. OFF AND ON SYMPTOMS/CALL PT WITH RECOMMENDATION WHO TO SEE FIRST.  GM ------

## 2013-05-02 NOTE — Telephone Encounter (Signed)
LM that I returned her call but she had taken her mother to the doctors.

## 2013-05-08 DIAGNOSIS — F429 Obsessive-compulsive disorder, unspecified: Secondary | ICD-10-CM | POA: Diagnosis not present

## 2013-05-09 ENCOUNTER — Other Ambulatory Visit: Payer: Self-pay | Admitting: *Deleted

## 2013-05-09 ENCOUNTER — Ambulatory Visit (INDEPENDENT_AMBULATORY_CARE_PROVIDER_SITE_OTHER): Payer: Medicare Other | Admitting: Internal Medicine

## 2013-05-09 ENCOUNTER — Other Ambulatory Visit: Payer: Self-pay | Admitting: Cardiovascular Disease

## 2013-05-09 ENCOUNTER — Encounter: Payer: Self-pay | Admitting: Internal Medicine

## 2013-05-09 VITALS — BP 118/72 | HR 70 | Temp 98.3°F | Resp 10 | Wt 205.1 lb

## 2013-05-09 DIAGNOSIS — E236 Other disorders of pituitary gland: Secondary | ICD-10-CM

## 2013-05-09 DIAGNOSIS — E669 Obesity, unspecified: Secondary | ICD-10-CM | POA: Diagnosis not present

## 2013-05-09 DIAGNOSIS — E041 Nontoxic single thyroid nodule: Secondary | ICD-10-CM

## 2013-05-09 MED ORDER — SIMVASTATIN 40 MG PO TABS: ORAL_TABLET | ORAL | Status: DC
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
## 2013-05-09 NOTE — Patient Instructions (Signed)
Keep up the great work!!! Please come back for a follow-up appointment in 6 months. Please join MyChart and let me know when you have the ultrasound.

## 2013-05-09 NOTE — Telephone Encounter (Signed)
Requested Prescriptions   Signed Prescriptions Disp Refills  . simvastatin (ZOCOR) 40 MG tablet 30 tablet 1    Sig: TAKE 1 TABLET BY MOUTH AT BEDTIME.    Authorizing Provider: Antonieta Iba    Ordering User: Kendrick Fries

## 2013-05-11 ENCOUNTER — Encounter: Payer: Self-pay | Admitting: Podiatry

## 2013-05-11 ENCOUNTER — Ambulatory Visit: Payer: Medicare Other | Admitting: Podiatry

## 2013-05-11 VITALS — BP 126/62 | HR 75 | Resp 16 | Ht 65.0 in | Wt 200.0 lb

## 2013-05-11 DIAGNOSIS — B351 Tinea unguium: Secondary | ICD-10-CM

## 2013-05-11 DIAGNOSIS — L600 Ingrowing nail: Secondary | ICD-10-CM | POA: Diagnosis not present

## 2013-05-11 NOTE — Patient Instructions (Signed)

## 2013-05-11 NOTE — Progress Notes (Signed)
N o L great toenail right D 2009 O slowly C worse A past-bed sheets,socks,shoes T has pedicures,

## 2013-05-12 NOTE — Progress Notes (Signed)
Subjective:     Patient ID: Tracey Wiley, female   DOB: Apr 08, 1956, 57 y.o.   MRN: [id number]  HPI patient states I have a damage big toenail on my right foot that I can not cut trim and it is painful with shoe gear and has been going on for years   Review of Systems  All other systems reviewed and are negative.       Objective:   Physical Exam  Nursing note and vitals reviewed. Constitutional: She is oriented to person, place, and time.  Cardiovascular: Intact distal pulses.   Musculoskeletal: Normal range of motion.  Neurological: She is oriented to person, place, and time.  Skin: Skin is warm.   neurovascular status intact with muscle strength adequate and no equinus condition noted. 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 Discussed trimming versus nail removal and the fact that nail removal will be permanent because of the length of time she's had the problem. Patient wants to surgery understanding risk and at this time I infiltrated 60 mg Xylocaine Marcaine mixture remove the hallux nail exposed matrix and applied 5 applications of phenol followed by alcohol lavaged and sterile dressing. Applied bandage and instructed recovery will be 4 weeks

## 2013-05-15 DIAGNOSIS — F429 Obsessive-compulsive disorder, unspecified: Secondary | ICD-10-CM | POA: Diagnosis not present

## 2013-05-17 ENCOUNTER — Telehealth: Payer: Self-pay | Admitting: *Deleted

## 2013-05-17 MED ORDER — AMOXICILLIN 500 MG PO CAPS: ORAL_CAPSULE | ORAL | Status: DC

## 2013-05-17 NOTE — Addendum Note (Signed)
Addended by: Sherlene Shams on: 05/17/2013 02:02 PM   Modules accepted: Orders

## 2013-05-17 NOTE — Telephone Encounter (Signed)
Will send rx to Pharmacy for amoxicillin 2000 mg one hour priro to procedure

## 2013-05-17 NOTE — Telephone Encounter (Signed)
Patient stated she has an implanted mesh in the wall between two chambers the size of a half Dollar wanted you to know for FYI.

## 2013-05-17 NOTE — Telephone Encounter (Signed)
Guidelines for amoxicillin prohpylaxis have cHANGED.  ONLY PATIENT WITH MECHANICAL heart valves or prosthetic joints need take it beofre dental procedures.  I dont' recall her having either

## 2013-05-17 NOTE — Telephone Encounter (Signed)
Well in that case I  Agree with the amoxicilling. Will send rx to the

## 2013-05-17 NOTE — Telephone Encounter (Signed)
Patient normally is premedicated for dental work and new dentist stated it was he cardiologist job and cardiology told her it was a primary care responsibility patient is upset and confused she just takes amoxicillin before dental procedures . Please advise.

## 2013-05-17 NOTE — Telephone Encounter (Signed)
Pt notified rx sent to pharmacy

## 2013-05-23 ENCOUNTER — Ambulatory Visit: Payer: Self-pay | Admitting: Internal Medicine

## 2013-05-23 DIAGNOSIS — F429 Obsessive-compulsive disorder, unspecified: Secondary | ICD-10-CM | POA: Diagnosis not present

## 2013-05-23 DIAGNOSIS — E042 Nontoxic multinodular goiter: Secondary | ICD-10-CM | POA: Diagnosis not present

## 2013-05-24 ENCOUNTER — Ambulatory Visit: Payer: Medicare Other | Admitting: Internal Medicine

## 2013-05-25 ENCOUNTER — Encounter: Payer: Self-pay | Admitting: Internal Medicine

## 2013-05-25 ENCOUNTER — Ambulatory Visit (INDEPENDENT_AMBULATORY_CARE_PROVIDER_SITE_OTHER): Payer: Medicare Other | Admitting: Internal Medicine

## 2013-05-25 VITALS — BP 122/78 | HR 66 | Temp 98.7°F | Wt 207.0 lb

## 2013-05-25 DIAGNOSIS — J209 Acute bronchitis, unspecified: Secondary | ICD-10-CM | POA: Diagnosis not present

## 2013-05-25 DIAGNOSIS — H6690 Otitis media, unspecified, unspecified ear: Secondary | ICD-10-CM | POA: Diagnosis not present

## 2013-05-25 DIAGNOSIS — H6693 Otitis media, unspecified, bilateral: Secondary | ICD-10-CM

## 2013-05-25 MED ORDER — AMOXICILLIN-POT CLAVULANATE 875-125 MG PO TABS: 1.0000 | ORAL_TABLET | Freq: Two times a day (BID) | ORAL | Status: DC

## 2013-05-25 MED ORDER — BENZONATATE 200 MG PO CAPS: 200.0000 mg | ORAL_CAPSULE | Freq: Two times a day (BID) | ORAL | Status: DC | PRN

## 2013-05-25 NOTE — Patient Instructions (Signed)
Start Augmentin tonight. Use Tessalon as needed for cough.  Call with update on Monday.  Follow up to recheck ears in 2 weeks.

## 2013-05-25 NOTE — Progress Notes (Signed)
Pre-visit discussion using our clinic review tool. No additional management support is needed unless otherwise documented below in the visit note.  

## 2013-05-25 NOTE — Progress Notes (Signed)
Subjective:    Patient ID: Tracey Wiley, female    DOB: June 19, 1955, 57 y.o.   MRN: [id number]  HPI 57YO female with history of hypertension and previous pneumonia in 2012 and 2013 presents for acute acute visit. Last Friday developed sore throat. Persisted until Sunday developed congestion, increased sneezing, increased mucous production. Tuesday developed cough productive of purulent sputum. This persistent over Tuesday through today. Constant aching headache. Taking Dayquil and Tylenol with minimal improvement. Also using Tessalon with some improvement. No fever, chills.  Non-smoker.   Outpatient Encounter Prescriptions as of 05/25/2013  Medication Sig  . acetaminophen (TYLENOL) 500 MG tablet Take 500 mg by mouth daily as needed for pain.  Marland Kitchen albuterol (PROVENTIL HFA;VENTOLIN HFA) 108 (90 BASE) MCG/ACT inhaler Inhale 2 puffs into the lungs every 6 (six) hours as needed for wheezing.  Marland Kitchen ALPRAZolam (XANAX) 0.5 MG tablet Take 0.5 mg by mouth at bedtime as needed for anxiety.  Marland Kitchen amoxicillin (AMOXIL) 500 MG capsule Take 4 caspules 1 hour prior to dental work  . aspirin EC 81 MG tablet Take 81 mg by mouth daily.  . celecoxib (CELEBREX) 200 MG capsule Take 200 mg by mouth as needed.  . chlorhexidine (PERIDEX) 0.12 % solution Use as directed 15 mLs in the mouth or throat 2 (two) times daily.  Marland Kitchen esomeprazole (NEXIUM) 40 MG capsule Take 40 mg by mouth 2 (two) times daily before a meal.   . hyoscyamine (LEVSIN SL) 0.125 MG SL tablet Place 1 tablet (0.125 mg total) under the tongue every 4 (four) hours as needed for cramping.  Marland Kitchen ibuprofen (ADVIL,MOTRIN) 200 MG tablet Take 200 mg by mouth as needed.    . lidocaine (LIDODERM) 5 % Place 1 patch onto the skin daily. Remove & Discard patch within 12 hours or as directed by MD  . nitroGLYCERIN (NITROSTAT) 0.4 MG SL tablet Place 1 tablet (0.4 mg total) under the tongue every 5 (five) minutes as needed for chest pain.  . simvastatin (ZOCOR) 40 MG tablet TAKE 1  TABLET BY MOUTH AT BEDTIME.  . sotalol (BETAPACE) 80 MG tablet TAKE 1/2 TAB AT 9 A.M. AND 1 AND 1/2 TABS AT 9 P.M. AND 1/2 TAB AS NEEDED  . sucralfate (CARAFATE) 1 GM/10ML suspension Take 1 g by mouth 4 (four) times daily.  Marland Kitchen triamterene-hydrochlorothiazide (DYAZIDE) 37.5-25 MG per capsule Take 1 each (1 capsule total) by mouth every morning.  Marland Kitchen VALTREX 1 G tablet TAKE 2 TABLETS BY MOUTH 3 TIMES A DAY AS NEEDED  . XANAX XR 0.5 MG 24 hr tablet TAKE 1 TABLET BY MOUTH IN THE MORNING  . [DISCONTINUED] ZOCOR 40 MG tablet TAKE 1 TABLET BY MOUTH AT BEDTIME.     Review of Systems  Constitutional: Positive for fatigue. Negative for fever, chills and unexpected weight change.  HENT: Positive for congestion, postnasal drip, rhinorrhea, sneezing, sore throat and voice change. Negative for ear discharge, ear pain, facial swelling, hearing loss, mouth sores, nosebleeds, sinus pressure, tinnitus and trouble swallowing.   Eyes: Negative for pain, discharge, redness and visual disturbance.  Respiratory: Positive for cough. Negative for chest tightness, shortness of breath, wheezing and stridor.   Cardiovascular: Negative for chest pain, palpitations and leg swelling.  Musculoskeletal: Negative for arthralgias, myalgias, neck pain and neck stiffness.  Skin: Negative for color change and rash.  Neurological: Negative for dizziness, weakness, light-headedness and headaches.  Hematological: Negative for adenopathy.       Objective:   Physical Exam  Constitutional: She is oriented  to person, place, and time. She appears well-developed and well-nourished. No distress.  HENT:  Head: Normocephalic and atraumatic.  Right Ear: External ear normal. Tympanic membrane is erythematous and bulging. A middle ear effusion is present.  Left Ear: External ear normal. Tympanic membrane is erythematous and bulging. A middle ear effusion is present.  Nose: Nose normal.  Mouth/Throat: Oropharynx is clear and moist. No  oropharyngeal exudate.  Eyes: Conjunctivae are normal. Pupils are equal, round, and reactive to light. Right eye exhibits no discharge. Left eye exhibits no discharge. No scleral icterus.  Neck: Normal range of motion. Neck supple. No tracheal deviation present. No thyromegaly present.  Cardiovascular: Normal rate, regular rhythm, normal heart sounds and intact distal pulses.  Exam reveals no gallop and no friction rub.   No murmur heard. Pulmonary/Chest: Effort normal. No accessory muscle usage. Not tachypneic. No respiratory distress. She has no decreased breath sounds. She has no wheezes. She has rhonchi (scattered). She has no rales. She exhibits no tenderness.  Musculoskeletal: Normal range of motion. She exhibits no edema and no tenderness.  Lymphadenopathy:    She has no cervical adenopathy.  Neurological: She is alert and oriented to person, place, and time. No cranial nerve deficit. She exhibits normal muscle tone. Coordination normal.  Skin: Skin is warm and dry. No rash noted. She is not diaphoretic. No erythema. No pallor.  Psychiatric: She has a normal mood and affect. Her behavior is normal. Judgment and thought content normal.          Assessment & Plan:

## 2013-05-26 DIAGNOSIS — J209 Acute bronchitis, unspecified: Secondary | ICD-10-CM | POA: Insufficient documentation

## 2013-05-26 DIAGNOSIS — H6693 Otitis media, unspecified, bilateral: Secondary | ICD-10-CM | POA: Insufficient documentation

## 2013-05-26 NOTE — Assessment & Plan Note (Signed)
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 Likely secondary bacterial infection after initial viral URI. Will start Augmentin. Tylenol or Ibuprofen prn fever or pain. Recheck ears in 2-4 weeks. Follow up if persistent pain, fever, or other concerns.

## 2013-05-26 NOTE — Assessment & Plan Note (Addendum)
Symptoms consistent with early bronchitis. Will start Augmentin. Encouraged rest, adequate fluid intake. Albuterol prn. Tessalon as needed for cough. Tylenol or Ibuprofen for pain or fever. Follow up for recheck in 2-4 weeks or sooner if no improvement.

## 2013-06-13 ENCOUNTER — Encounter: Payer: Self-pay | Admitting: Internal Medicine

## 2013-06-13 ENCOUNTER — Ambulatory Visit (INDEPENDENT_AMBULATORY_CARE_PROVIDER_SITE_OTHER): Payer: Medicare Other | Admitting: Internal Medicine

## 2013-06-13 VITALS — BP 116/72 | HR 69 | Temp 98.1°F | Resp 14 | Wt 208.2 lb

## 2013-06-13 DIAGNOSIS — F429 Obsessive-compulsive disorder, unspecified: Secondary | ICD-10-CM | POA: Diagnosis not present

## 2013-06-13 DIAGNOSIS — E669 Obesity, unspecified: Secondary | ICD-10-CM

## 2013-06-13 DIAGNOSIS — N62 Hypertrophy of breast: Secondary | ICD-10-CM | POA: Diagnosis not present

## 2013-06-13 DIAGNOSIS — J209 Acute bronchitis, unspecified: Secondary | ICD-10-CM | POA: Diagnosis not present

## 2013-06-13 DIAGNOSIS — R9389 Abnormal findings on diagnostic imaging of other specified body structures: Secondary | ICD-10-CM

## 2013-06-13 DIAGNOSIS — L732 Hidradenitis suppurativa: Secondary | ICD-10-CM | POA: Diagnosis not present
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
## 2013-06-13 NOTE — Patient Instructions (Signed)
Hidradenitis Suppurativa, Sweat Gland Abscess °Hidradenitis suppurativa is a long lasting (chronic), uncommon disease of the sweat glands. With this, boil-like lumps and scarring develop in the groin, some times under the arms (axillae), and under the breasts. It may also uncommonly occur behind the ears, in the crease of the buttocks, and around the genitals.  °CAUSES  °The cause is from a blocking of the sweat glands. They then become infected. It may cause drainage and odor. It is not contagious. So it cannot be given to someone else. It most often shows up in puberty (about 10 to 56 years of age). But it may happen much later. It is similar to acne which is a disease of the sweat glands. This condition is slightly more common in African-Americans and women. °SYMPTOMS  °· Hidradenitis usually starts as one or more red, tender, swellings in the groin or under the arms (axilla). °· Over a period of hours to days the lesions get larger. They often open to the skin surface, draining clear to yellow-colored fluid. °· The infected area heals with scarring. °DIAGNOSIS  °Your caregiver makes this diagnosis by looking at you. Sometimes cultures (growing germs on plates in the lab) may be taken. This is to see what germ (bacterium) is causing the infection.  °TREATMENT  °· Topical germ killing medicine applied to the skin (antibiotics) are the treatment of choice. Antibiotics taken by mouth (systemic) are sometimes needed when the condition is getting worse or is severe. °· Avoid tight-fitting clothing which traps moisture in. °· Dirt does not cause hidradenitis and it is not caused by poor hygiene. °· Involved areas should be cleaned daily using an antibacterial soap. Some patients find that the liquid form of Lever 2000®, applied to the involved areas as a lotion after bathing, can help reduce the odor related to this condition. °· Sometimes surgery is needed to drain infected areas or remove scarred tissue. Removal of  large amounts of tissue is used only in severe cases. °· Birth control pills may be helpful. °· Oral retinoids (vitamin A derivatives) for 6 to 12 months which are effective for acne may also help this condition. °· Weight loss will improve but not cure hidradenitis. It is made worse by being overweight. But the condition is not caused by being overweight. °· This condition is more common in people who have had acne. °· It may become worse under stress. °There is no medical cure for hidradenitis. It can be controlled, but not cured. The condition usually continues for years with periods of getting worse and getting better (remission). °Document Released: 01/06/2004 Document Revised: 08/16/2011 Document Reviewed: 01/22/2008 °[employer]® Patient Information ©2014 [employer]. ° °

## 2013-06-14 ENCOUNTER — Telehealth: Payer: Self-pay | Admitting: Internal Medicine

## 2013-06-14 DIAGNOSIS — R9389 Abnormal findings on diagnostic imaging of other specified body structures: Secondary | ICD-10-CM | POA: Insufficient documentation

## 2013-06-14 NOTE — Assessment & Plan Note (Addendum)
She has lost several pounds using a low glycemic index diet but has regained them again.  Her nadir is 197 lbs last year. I have addressed  BMI and recommended a low glycemic index diet utilizing smaller more frequent meals to increase metabolism.  I have also recommended that patient start exercising with a goal of 30 minutes of aerobic exercise a minimum of 5 days per week.

## 2013-06-14 NOTE — Assessment & Plan Note (Signed)
She is dismayed as she has not lost more weight to report. However she has been distracted by health matters including recent hospitalization for chest pain, and occipital headaches caused by an expressive. These issues are now working as those out and she is in 4 to getting back into a regular routine of exercise and diet.

## 2013-06-14 NOTE — Assessment & Plan Note (Signed)
Suggested by history of recurrent abscess and inflammation of pubic area since July.  She is  currently on cleocin 300 mg bid,  Prescribed by surgery with follow up nexgt week. Records requested from dermatology and surgery. Handout given

## 2013-06-14 NOTE — Assessment & Plan Note (Signed)
resolved with augmentin.

## 2013-06-14 NOTE — Assessment & Plan Note (Signed)
Given her need for pretreatment with high dose steroids,  Repeat CT has been delayed by recurrent skin infections.  If her skin issues is, as I suspect, HS, I see no reason to delay the CT as she can take steroids and it will not likely alter the course of her disease.

## 2013-06-14 NOTE — Telephone Encounter (Signed)
Copy given to Dr. Derrel Nip

## 2013-06-14 NOTE — Telephone Encounter (Signed)
I do not have a copy of the previous disability form done for Tracey Wiley,. Did she keep a copy of the previous one that I can review? I do not know what diagnosis she is considered 'disabled" for , since she ambulates without a walker and exercises,  I cannot use spinal stenosis

## 2013-06-15 ENCOUNTER — Ambulatory Visit: Payer: Medicare Other | Admitting: Internal Medicine

## 2013-06-19 DIAGNOSIS — F429 Obsessive-compulsive disorder, unspecified: Secondary | ICD-10-CM | POA: Diagnosis not present

## 2013-06-20 NOTE — Telephone Encounter (Signed)
Placed form up front for patient to pick up patient notified.

## 2013-06-22 ENCOUNTER — Encounter: Payer: Self-pay | Admitting: Internal Medicine

## 2013-06-29 ENCOUNTER — Other Ambulatory Visit: Payer: Self-pay | Admitting: Cardiovascular Disease

## 2013-06-29 NOTE — Telephone Encounter (Signed)
Requested Prescriptions   Signed Prescriptions Disp Refills  . ZOCOR 40 MG tablet 30 tablet 1    Sig: TAKE 1 TABLET BY MOUTH AT BEDTIME.    Authorizing Provider: Minna Merritts    Ordering User: Britt Bottom

## 2013-07-02 ENCOUNTER — Telehealth: Payer: Self-pay | Admitting: Internal Medicine

## 2013-07-02 ENCOUNTER — Ambulatory Visit (INDEPENDENT_AMBULATORY_CARE_PROVIDER_SITE_OTHER): Payer: Medicare Other | Admitting: Internal Medicine

## 2013-07-02 ENCOUNTER — Encounter: Payer: Self-pay | Admitting: Internal Medicine

## 2013-07-02 VITALS — BP 100/72 | HR 64 | Temp 98.3°F | Ht 65.0 in | Wt 206.1 lb

## 2013-07-02 DIAGNOSIS — L732 Hidradenitis suppurativa: Secondary | ICD-10-CM

## 2013-07-02 DIAGNOSIS — Z0000 Encounter for general adult medical examination without abnormal findings: Secondary | ICD-10-CM

## 2013-07-02 DIAGNOSIS — E041 Nontoxic single thyroid nodule: Secondary | ICD-10-CM

## 2013-07-02 DIAGNOSIS — R911 Solitary pulmonary nodule: Secondary | ICD-10-CM

## 2013-07-02 DIAGNOSIS — K208 Other esophagitis without bleeding: Secondary | ICD-10-CM

## 2013-07-02 DIAGNOSIS — R079 Chest pain, unspecified: Secondary | ICD-10-CM

## 2013-07-02 NOTE — Telephone Encounter (Signed)
FYI

## 2013-07-02 NOTE — Patient Instructions (Signed)
We will schedule the CT in th next few weeks once we have the prednisone protocol

## 2013-07-02 NOTE — Assessment & Plan Note (Signed)
She is overdue for follow up chest CT but will need premedication with prednisone, which will interfere with any endocrine evaluation planned., we will postpone until after her endocrine evaluation

## 2013-07-02 NOTE — Progress Notes (Signed)
Patient ID: Tracey Wiley, female   DOB: [birth year], 58 y.o.   MRN: [id number]  Subjective:     Tracey Wiley is a 58 y.o. female and is here for a comprehensive physical exam. The patient reports Many problems as listed below..   Defrancesco did pelvic in September ,    Ordered mammogram .   Went to see Dr Verl Blalock for evaluation of esophagitis and for screening colonoscopy.  Very unhappy with the experience. Long story reflecting her frustration no with Dr. Yolanda Bonine office will be scheduling and rescheduling of her endoscopy which resulted in prolonged suspension of her medications for reflux. She was deferred by Dr. Yolanda Bonine office continually because she wanted to see Dr Roselyn Reef,  not the PA.  After 4 reschedulings,  she presented for her office visit with Dr. wall and was instead given. She was trying to avoid,  who was condescending and argumentative about why patient didn't want to see a PA.  Nexium  Dose was doubled,  EGD with pH probe was scheduled at [facility]  And all NSAIDs and PPIs were suspended initially for 10 days. However by the time of procedure actually occurred which was she had been off of her medications for over 3 weeks.   Then the day of the procedure it was postponed another 7 days and rescheduled in [place].  Postponed AGAIN for two weeks, the day of the procedure for [facility] in [place] .  The day before it was cancelled again and rescheduled at [facility] . The day of the procedure,  The probe was 'broken" so the EGD was done without the ph probe .  Endoscopy showed normal esophagus and normal stomach , per patient .    Biopsy results were given to her by Debbora Presto despite patient waiting a month to see Dr Verl Blalock., reflux esophagitis on biopsy  Told to stop all NSAIDs indefinitely.  Her esophagus and stomach are now feeling better since she stopped all NSAIDs since October and is aggravated by acidic foods.    Colonoscopy was supposed to be done last month , q 2 yrs,  last genetic testing from  [facility] 2012 ordered by Georga Bora   Repeat CT scan of chest now ready to do to follow up on needs prednisone protocol for contrast allergy       History   Social History  . Marital Status: Married    Spouse Name: N/A    Number of Children: N/A  . Years of Education: 14   Occupational History  . Not on file.   Social History Main Topics  . Smoking status: Never Smoker   . Smokeless tobacco: Never Used  . Alcohol Use: No  . Drug Use: No  . Sexual Activity: Yes    Partners: Male   Other Topics Concern  . 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 Influenza Vaccine  01/05/2014  . Mammogram  10/18/2014  . Pap Smear  02/06/2016  . Tetanus/tdap  09/05/2017  . Colonoscopy  05/07/2021    The following portions of the patient's history were reviewed and updated as  appropriate: allergies, current medications, past family history, past medical history, past social history, past surgical history and problem list.  Review of Systems A comprehensive review of systems was negative.   Objective:   General appearance: alert, cooperative and appears stated age Ears: normal TM's and external ear canals both ears Throat: lips, mucosa, and tongue normal; teeth and gums normal Neck: no adenopathy, no carotid bruit, supple, symmetrical, trachea midline and thyroid not enlarged, symmetric, no tenderness/mass/nodules Back: symmetric, no curvature. ROM normal. No CVA tenderness. Lungs: clear to auscultation bilaterally Heart: regular rate and rhythm, S1, S2 normal, no murmur, click, rub or gallop Abdomen: soft, non-tender; bowel sounds normal; no masses,  no organomegaly Pulses: 2+ and symmetric Skin:  Skin color, texture, turgor normal. No rashes or lesions Lymph nodes: Cervical, supraclavicular, and axillary nodes normal.  Assessment and Plan:   Thyroid cyst She is overdue for follow up chest CT but will need premedication with prednisone, which will interfere with any endocrine evaluation planned., we will postpone until after her endocrine evaluation     Solitary pulmonary nodule She is overdue for follow up chest CT but will need premedication with prednisone. CT has been ordered today.  Vulval hidradenitis suppurativa Symptoms are currently quiescent. She's not sure whether it was the closest to the doxycycline that resolved it. She has been instructed by a general surgeon to return to his office at the first sign of inflammation.  Routine general medical examination at a health care facility Annual comprehensive exam was doneexcluding breast, pelvic and PAP smear. All screenings have been addressed .   CHEST PAIN-UNSPECIFIED Secondary to esophagitis is seen on recent EGD with biopsies showing chronic mild gastritis. All NSAIDs have been permanently discontinued. Patient will have to follow up with her pain clinic physician for management of pain since she was deriving considerable benefit from her NSAIDs. She has been chest pain-free since she stopped her NSAIDs.  Other esophagitis Found on biopsy during EGD done by Dr. wall after many weeks of delay. Patient's symptoms have improved since she has stopped all NSAIDs.  A total of 55 minutes was spent with patient more than half of which was spent in counseling, reviewing records from other prviders and coordination of care.   Updated Medication List Outpatient Encounter Prescriptions as of 07/02/2013  Medication Sig  . acetaminophen (TYLENOL) 500 MG tablet Take 500 mg by mouth daily as needed for pain.  Marland Kitchen albuterol (PROVENTIL HFA;VENTOLIN HFA) 108 (90 BASE) MCG/ACT inhaler Inhale 2 puffs into the lungs every 6 (six) hours as  needed for wheezing.  Marland Kitchen ALPRAZolam (XANAX) 0.5 MG tablet Take 0.5 mg by mouth at bedtime as needed for anxiety.  Marland Kitchen aspirin EC 81 MG tablet Take 81 mg by mouth daily.  . celecoxib (CELEBREX) 200 MG capsule Take 200 mg by mouth as needed.  . chlorhexidine (PERIDEX) 0.12 % solution Use as directed 15 mLs in the mouth or throat 2 (two) times daily.  Marland Kitchen esomeprazole (NEXIUM) 40 MG capsule Take 40 mg by mouth 2 (two) times daily before a meal.   . hyoscyamine (LEVSIN SL) 0.125 MG SL tablet Place 1 tablet (0.125 mg total) under the tongue every 4 (four) hours as needed for cramping.  . lidocaine (LIDODERM) 5 % Place 1 patch onto the skin daily. Remove & Discard patch within 12 hours or as directed by MD  . sotalol (BETAPACE) 80 MG tablet TAKE 1/2 TAB AT 9 A.M. AND 1 AND 1/2 TABS AT 9 P.M.  AND 1/2 TAB AS NEEDED  . triamterene-hydrochlorothiazide (DYAZIDE) 37.5-25 MG per capsule Take 1 each (1 capsule total) by mouth every morning.  Marland Kitchen VALTREX 1 G tablet TAKE 2 TABLETS BY MOUTH 3 TIMES A DAY AS NEEDED  . XANAX XR 0.5 MG 24 hr tablet TAKE 1 TABLET BY MOUTH IN THE MORNING  . ZOCOR 40 MG tablet TAKE 1 TABLET BY MOUTH AT BEDTIME.  Marland Kitchen amoxicillin (AMOXIL) 500 MG capsule Take 4 caspules 1 hour prior to dental work  . nitroGLYCERIN (NITROSTAT) 0.4 MG SL tablet Place 1 tablet (0.4 mg total) under the tongue every 5 (five) minutes as needed for chest pain.  Marland Kitchen sucralfate (CARAFATE) 1 GM/10ML suspension Take 1 g by mouth 4 (four) times daily.  . [DISCONTINUED] amoxicillin-clavulanate (AUGMENTIN) 875-125 MG per tablet Take 1 tablet by mouth 2 (two) times daily.  . [DISCONTINUED] benzonatate (TESSALON) 200 MG capsule Take 1 capsule (200 mg total) by mouth 2 (two) times daily as needed for cough.  . [DISCONTINUED] ibuprofen (ADVIL,MOTRIN) 200 MG tablet Take 200 mg by mouth as needed.

## 2013-07-02 NOTE — Telephone Encounter (Signed)
Pt states at appt she gave Dr. Derrel Nip wrong info.  Pt told her about endoscopy center, called it [facility].  States that was wrong, correct name is [facility] in [place].

## 2013-07-02 NOTE — Progress Notes (Signed)
Pre visit review using our clinic review tool, if applicable. No additional management support is needed unless otherwise documented below in the visit note. 

## 2013-07-03 DIAGNOSIS — Z0000 Encounter for general adult medical examination without abnormal findings: Secondary | ICD-10-CM | POA: Insufficient documentation

## 2013-07-03 DIAGNOSIS — F429 Obsessive-compulsive disorder, unspecified: Secondary | ICD-10-CM | POA: Diagnosis not present

## 2013-07-03 DIAGNOSIS — K208 Other esophagitis without bleeding: Secondary | ICD-10-CM | POA: Insufficient documentation

## 2013-07-03 NOTE — Assessment & Plan Note (Signed)
Symptoms are currently quiescent. She's not sure whether it was the closest to the doxycycline that resolved it. She has been instructed by a general surgeon to return to his office at the first sign of inflammation.

## 2013-07-03 NOTE — Assessment & Plan Note (Signed)
Annual comprehensive exam was done excluding breast, pelvic and PAP smear. All screenings have been addressed .  

## 2013-07-03 NOTE — Assessment & Plan Note (Signed)
She is overdue for follow up chest CT but will need premedication with prednisone. CT has been ordered today.

## 2013-07-03 NOTE — Assessment & Plan Note (Signed)
Found on biopsy during EGD done by Dr. wall after many weeks of delay. Patient's symptoms have improved since she has stopped all NSAIDs.

## 2013-07-03 NOTE — Assessment & Plan Note (Signed)
Secondary to esophagitis is seen on recent EGD with biopsies showing chronic mild gastritis. All NSAIDs have been permanently discontinued. Patient will have to follow up with her pain clinic physician for management of pain since she was deriving considerable benefit from her NSAIDs. She has been chest pain-free since she stopped her NSAIDs.

## 2013-07-10 ENCOUNTER — Ambulatory Visit: Payer: Self-pay | Admitting: Internal Medicine

## 2013-07-10 DIAGNOSIS — R911 Solitary pulmonary nodule: Secondary | ICD-10-CM | POA: Diagnosis not present

## 2013-07-10 DIAGNOSIS — F429 Obsessive-compulsive disorder, unspecified: Secondary | ICD-10-CM | POA: Diagnosis not present

## 2013-07-15 ENCOUNTER — Telehealth: Payer: Self-pay | Admitting: Internal Medicine

## 2013-07-15 DIAGNOSIS — R911 Solitary pulmonary nodule: Secondary | ICD-10-CM

## 2013-07-15 NOTE — Telephone Encounter (Signed)
Her Ct scan showed that Posterior right upper lobe ,  4 mm is unchanged compared to 2010. And therefore benign.

## 2013-07-15 NOTE — Assessment & Plan Note (Signed)
Posterior right upper lobe ,  4 mm unchanged compared to 2010.

## 2013-07-16 ENCOUNTER — Telehealth: Payer: Self-pay | Admitting: *Deleted

## 2013-07-16 NOTE — Telephone Encounter (Signed)
They did not state a comparison to 2014 CT scan.  There were no lower lobe nodules referenced in current CT ,only saw a nodule in the  right upper lobe ,  Was the 2014 scan done at [facility]?

## 2013-07-16 NOTE — Telephone Encounter (Signed)
Yes I am placing printed copy in red folder please advise.

## 2013-07-16 NOTE — Telephone Encounter (Signed)
Patient called back with question of concern was CT scan compared to Ct dated 08/08/12. Patient stated scan from 08/08/12 showed nodules in bilateral lower lobes please advise.

## 2013-07-16 NOTE — Telephone Encounter (Signed)
Patient notified of results and voiced understanding. 

## 2013-07-17 ENCOUNTER — Telehealth: Payer: Self-pay | Admitting: Internal Medicine

## 2013-07-17 DIAGNOSIS — F429 Obsessive-compulsive disorder, unspecified: Secondary | ICD-10-CM | POA: Diagnosis not present

## 2013-07-17 NOTE — Telephone Encounter (Signed)
Patient notified and voiced understanding.

## 2013-07-17 NOTE — Telephone Encounter (Signed)
The 2014 chest CT did not show lower lobe nodules ,, it showed an interstitial infiltrate (inflammation or infection suggested),  Which has cleared by repeat CT

## 2013-08-03 ENCOUNTER — Encounter: Payer: Self-pay | Admitting: Internal Medicine

## 2013-08-07 DIAGNOSIS — F429 Obsessive-compulsive disorder, unspecified: Secondary | ICD-10-CM | POA: Diagnosis not present

## 2013-08-16 ENCOUNTER — Other Ambulatory Visit: Payer: Self-pay | Admitting: Cardiovascular Disease

## 2013-08-16 NOTE — Telephone Encounter (Signed)
Patient needs to make a follow-up appointment.

## 2013-08-21 DIAGNOSIS — F429 Obsessive-compulsive disorder, unspecified: Secondary | ICD-10-CM | POA: Diagnosis not present

## 2013-08-28 DIAGNOSIS — F429 Obsessive-compulsive disorder, unspecified: Secondary | ICD-10-CM | POA: Diagnosis not present

## 2013-09-09 ENCOUNTER — Other Ambulatory Visit: Payer: Self-pay | Admitting: Cardiovascular Disease

## 2013-09-17 ENCOUNTER — Other Ambulatory Visit: Payer: Self-pay | Admitting: *Deleted

## 2013-09-17 NOTE — Telephone Encounter (Signed)
Electronic Rx request Please advise.

## 2013-09-18 DIAGNOSIS — F429 Obsessive-compulsive disorder, unspecified: Secondary | ICD-10-CM | POA: Diagnosis not present

## 2013-09-18 MED ORDER — XANAX XR 0.5 MG PO TB24: ORAL_TABLET | ORAL | Status: DC

## 2013-09-18 NOTE — Telephone Encounter (Signed)
Rx faxed to pharmacy  

## 2013-10-02 DIAGNOSIS — F429 Obsessive-compulsive disorder, unspecified: Secondary | ICD-10-CM | POA: Diagnosis not present

## 2013-10-02 DIAGNOSIS — Z800 Family history of malignant neoplasm of digestive organs: Secondary | ICD-10-CM | POA: Diagnosis not present

## 2013-10-02 DIAGNOSIS — N8000 Endometriosis of the uterus, unspecified: Secondary | ICD-10-CM | POA: Diagnosis not present

## 2013-10-02 DIAGNOSIS — Z8041 Family history of malignant neoplasm of ovary: Secondary | ICD-10-CM | POA: Diagnosis not present

## 2013-10-02 DIAGNOSIS — D259 Leiomyoma of uterus, unspecified: Secondary | ICD-10-CM | POA: Diagnosis not present

## 2013-10-04 DIAGNOSIS — N949 Unspecified condition associated with female genital organs and menstrual cycle: Secondary | ICD-10-CM | POA: Diagnosis not present

## 2013-10-04 DIAGNOSIS — Z800 Family history of malignant neoplasm of digestive organs: Secondary | ICD-10-CM | POA: Diagnosis not present

## 2013-10-04 DIAGNOSIS — Z8041 Family history of malignant neoplasm of ovary: Secondary | ICD-10-CM | POA: Diagnosis not present

## 2013-10-04 DIAGNOSIS — D259 Leiomyoma of uterus, unspecified: Secondary | ICD-10-CM | POA: Diagnosis not present

## 2013-10-04 DIAGNOSIS — N938 Other specified abnormal uterine and vaginal bleeding: Secondary | ICD-10-CM | POA: Diagnosis not present

## 2013-10-04 DIAGNOSIS — N8000 Endometriosis of the uterus, unspecified: Secondary | ICD-10-CM | POA: Diagnosis not present

## 2013-10-04 DIAGNOSIS — N925 Other specified irregular menstruation: Secondary | ICD-10-CM | POA: Diagnosis not present

## 2013-10-04 DIAGNOSIS — N8500 Endometrial hyperplasia, unspecified: Secondary | ICD-10-CM | POA: Diagnosis not present

## 2013-10-17 DIAGNOSIS — N8000 Endometriosis of the uterus, unspecified: Secondary | ICD-10-CM | POA: Diagnosis not present

## 2013-10-17 DIAGNOSIS — Z800 Family history of malignant neoplasm of digestive organs: Secondary | ICD-10-CM | POA: Diagnosis not present

## 2013-10-25 ENCOUNTER — Encounter: Payer: Self-pay | Admitting: Adult Health

## 2013-10-25 ENCOUNTER — Ambulatory Visit (INDEPENDENT_AMBULATORY_CARE_PROVIDER_SITE_OTHER): Payer: Medicare Other | Admitting: Adult Health

## 2013-10-25 VITALS — BP 110/68 | HR 60 | Temp 98.0°F | Resp 14 | Ht 65.0 in | Wt 211.8 lb

## 2013-10-25 DIAGNOSIS — L089 Local infection of the skin and subcutaneous tissue, unspecified: Secondary | ICD-10-CM | POA: Diagnosis not present
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
## 2013-10-25 NOTE — Progress Notes (Signed)
Pre visit review using our clinic review tool, if applicable. No additional management support is needed unless otherwise documented below in the visit note. 

## 2013-11-05 DIAGNOSIS — F429 Obsessive-compulsive disorder, unspecified: Secondary | ICD-10-CM | POA: Diagnosis not present

## 2013-11-06 ENCOUNTER — Other Ambulatory Visit: Payer: Self-pay | Admitting: Cardiovascular Disease

## 2013-11-07 ENCOUNTER — Ambulatory Visit: Payer: Medicare Other | Admitting: Internal Medicine

## 2013-11-07 ENCOUNTER — Ambulatory Visit: Payer: Self-pay | Admitting: Obstetrics and Gynecology

## 2013-11-07 DIAGNOSIS — Z1509 Genetic susceptibility to other malignant neoplasm: Secondary | ICD-10-CM | POA: Diagnosis not present

## 2013-11-07 DIAGNOSIS — Z780 Asymptomatic menopausal state: Secondary | ICD-10-CM | POA: Diagnosis not present

## 2013-11-07 DIAGNOSIS — Z01810 Encounter for preprocedural cardiovascular examination: Secondary | ICD-10-CM | POA: Diagnosis not present

## 2013-11-07 DIAGNOSIS — I10 Essential (primary) hypertension: Secondary | ICD-10-CM | POA: Diagnosis not present

## 2013-11-07 DIAGNOSIS — E669 Obesity, unspecified: Secondary | ICD-10-CM | POA: Diagnosis not present

## 2013-11-07 DIAGNOSIS — N8000 Endometriosis of the uterus, unspecified: Secondary | ICD-10-CM | POA: Diagnosis not present

## 2013-11-07 DIAGNOSIS — Z800 Family history of malignant neoplasm of digestive organs: Secondary | ICD-10-CM | POA: Diagnosis not present

## 2013-11-07 DIAGNOSIS — Z01812 Encounter for preprocedural laboratory examination: Secondary | ICD-10-CM | POA: Diagnosis not present

## 2013-11-07 LAB — BASIC METABOLIC PANEL
ANION GAP: 7 (ref 7–16)
BUN: 14 mg/dL (ref 7–18)
CALCIUM: 9 mg/dL (ref 8.5–10.1)
CHLORIDE: 102 mmol/L (ref 98–107)
Co2: 29 mmol/L (ref 21–32)
Creatinine: 0.74 mg/dL (ref 0.60–1.30)
EGFR (Non-African Amer.): >60
Glucose: 96 mg/dL (ref 65–99)
OSMOLALITY: 276 (ref 275–301)
POTASSIUM: 3.8 mmol/L (ref 3.5–5.1)
Sodium: 138 mmol/L (ref 136–145)

## 2013-11-07 LAB — CBC
HCT: 37.6 % (ref 35.0–47.0)
HGB: 12.7 g/dL (ref 12.0–16.0)
MCH: 28.7 pg (ref 26.0–34.0)
MCHC: 33.7 g/dL (ref 32.0–36.0)
MCV: 85 fL (ref 80–100)
Platelet: 268 10*3/uL (ref 150–440)
RBC: 4.41 10*6/uL (ref 3.80–5.20)
RDW: 14.4 % (ref 11.5–14.5)
WBC: 8 10*3/uL (ref 3.6–11.0)

## 2013-11-12 ENCOUNTER — Ambulatory Visit: Payer: Self-pay | Admitting: Obstetrics and Gynecology

## 2013-11-12 DIAGNOSIS — Z9889 Other specified postprocedural states: Secondary | ICD-10-CM | POA: Diagnosis not present

## 2013-11-12 DIAGNOSIS — I10 Essential (primary) hypertension: Secondary | ICD-10-CM | POA: Diagnosis not present

## 2013-11-12 DIAGNOSIS — M542 Cervicalgia: Secondary | ICD-10-CM | POA: Diagnosis not present

## 2013-11-12 DIAGNOSIS — F341 Dysthymic disorder: Secondary | ICD-10-CM | POA: Diagnosis not present

## 2013-11-12 DIAGNOSIS — M545 Low back pain, unspecified: Secondary | ICD-10-CM | POA: Diagnosis not present

## 2013-11-12 DIAGNOSIS — R9389 Abnormal findings on diagnostic imaging of other specified body structures: Secondary | ICD-10-CM | POA: Diagnosis not present

## 2013-11-12 DIAGNOSIS — Z129 Encounter for screening for malignant neoplasm, site unspecified: Secondary | ICD-10-CM | POA: Diagnosis not present

## 2013-11-12 DIAGNOSIS — Z8041 Family history of malignant neoplasm of ovary: Secondary | ICD-10-CM | POA: Diagnosis not present

## 2013-11-12 DIAGNOSIS — E669 Obesity, unspecified: Secondary | ICD-10-CM | POA: Diagnosis not present

## 2013-11-12 DIAGNOSIS — Z79899 Other long term (current) drug therapy: Secondary | ICD-10-CM | POA: Diagnosis not present

## 2013-11-12 DIAGNOSIS — Z1509 Genetic susceptibility to other malignant neoplasm: Secondary | ICD-10-CM | POA: Diagnosis not present

## 2013-11-12 DIAGNOSIS — N8000 Endometriosis of the uterus, unspecified: Secondary | ICD-10-CM | POA: Diagnosis not present

## 2013-11-12 DIAGNOSIS — K219 Gastro-esophageal reflux disease without esophagitis: Secondary | ICD-10-CM | POA: Diagnosis not present

## 2013-11-12 DIAGNOSIS — Z8049 Family history of malignant neoplasm of other genital organs: Secondary | ICD-10-CM | POA: Diagnosis not present

## 2013-11-12 DIAGNOSIS — Z7982 Long term (current) use of aspirin: Secondary | ICD-10-CM | POA: Diagnosis not present

## 2013-11-12 DIAGNOSIS — Z6830 Body mass index (BMI) 30.0-30.9, adult: Secondary | ICD-10-CM | POA: Diagnosis not present

## 2013-11-12 DIAGNOSIS — M810 Age-related osteoporosis without current pathological fracture: Secondary | ICD-10-CM | POA: Diagnosis not present

## 2013-11-12 DIAGNOSIS — E782 Mixed hyperlipidemia: Secondary | ICD-10-CM | POA: Diagnosis not present

## 2013-11-12 DIAGNOSIS — Z87820 Personal history of traumatic brain injury: Secondary | ICD-10-CM | POA: Diagnosis not present

## 2013-11-12 DIAGNOSIS — E7800 Pure hypercholesterolemia, unspecified: Secondary | ICD-10-CM | POA: Diagnosis not present

## 2013-11-12 DIAGNOSIS — Z800 Family history of malignant neoplasm of digestive organs: Secondary | ICD-10-CM | POA: Diagnosis not present

## 2013-11-13 ENCOUNTER — Other Ambulatory Visit: Payer: Self-pay | Admitting: Internal Medicine

## 2013-11-14 NOTE — Telephone Encounter (Signed)
Refill

## 2013-11-15 LAB — PATHOLOGY REPORT

## 2013-11-28 DIAGNOSIS — F429 Obsessive-compulsive disorder, unspecified: Secondary | ICD-10-CM | POA: Diagnosis not present

## 2013-12-25 ENCOUNTER — Other Ambulatory Visit: Payer: Self-pay | Admitting: Cardiovascular Disease

## 2014-01-03 ENCOUNTER — Other Ambulatory Visit: Payer: Self-pay | Admitting: Cardiovascular Disease

## 2014-01-04 ENCOUNTER — Other Ambulatory Visit: Payer: Self-pay | Admitting: *Deleted

## 2014-01-04 MED ORDER — SIMVASTATIN 40 MG PO TABS: ORAL_TABLET | ORAL | Status: DC

## 2014-01-05 ENCOUNTER — Encounter (HOSPITAL_COMMUNITY): Payer: Self-pay | Admitting: Emergency Medicine

## 2014-01-05 ENCOUNTER — Emergency Department (HOSPITAL_COMMUNITY)
Admission: EM | Admit: 2014-01-05 | Discharge: 2014-01-05 | Disposition: A | Discharge status: Home / Self Care | Payer: Medicare Other | Attending: Emergency Medicine | Admitting: Emergency Medicine

## 2014-01-05 ENCOUNTER — Emergency Department (HOSPITAL_COMMUNITY): Payer: Medicare Other

## 2014-01-05 DIAGNOSIS — Z792 Long term (current) use of antibiotics: Secondary | ICD-10-CM | POA: Insufficient documentation

## 2014-01-05 DIAGNOSIS — Z87820 Personal history of traumatic brain injury: Secondary | ICD-10-CM | POA: Insufficient documentation

## 2014-01-05 DIAGNOSIS — Z8719 Personal history of other diseases of the digestive system: Secondary | ICD-10-CM | POA: Diagnosis not present

## 2014-01-05 DIAGNOSIS — Z87828 Personal history of other (healed) physical injury and trauma: Secondary | ICD-10-CM | POA: Diagnosis not present

## 2014-01-05 DIAGNOSIS — M109 Gout, unspecified: Secondary | ICD-10-CM | POA: Diagnosis not present

## 2014-01-05 DIAGNOSIS — M7989 Other specified soft tissue disorders: Secondary | ICD-10-CM | POA: Diagnosis not present

## 2014-01-05 DIAGNOSIS — I10 Essential (primary) hypertension: Secondary | ICD-10-CM | POA: Diagnosis not present

## 2014-01-05 DIAGNOSIS — Z8701 Personal history of pneumonia (recurrent): Secondary | ICD-10-CM | POA: Insufficient documentation

## 2014-01-05 DIAGNOSIS — E669 Obesity, unspecified: Secondary | ICD-10-CM | POA: Diagnosis not present

## 2014-01-05 DIAGNOSIS — Z7982 Long term (current) use of aspirin: Secondary | ICD-10-CM | POA: Insufficient documentation

## 2014-01-05 DIAGNOSIS — Z79899 Other long term (current) drug therapy: Secondary | ICD-10-CM | POA: Diagnosis not present

## 2014-01-05 DIAGNOSIS — Z8774 Personal history of (corrected) congenital malformations of heart and circulatory system: Secondary | ICD-10-CM | POA: Diagnosis not present

## 2014-01-05 DIAGNOSIS — M25539 Pain in unspecified wrist: Secondary | ICD-10-CM | POA: Insufficient documentation

## 2014-01-05 DIAGNOSIS — Z86718 Personal history of other venous thrombosis and embolism: Secondary | ICD-10-CM | POA: Insufficient documentation

## 2014-01-05 DIAGNOSIS — F411 Generalized anxiety disorder: Secondary | ICD-10-CM | POA: Insufficient documentation

## 2014-01-05 DIAGNOSIS — E785 Hyperlipidemia, unspecified: Secondary | ICD-10-CM | POA: Diagnosis not present

## 2014-01-05 DIAGNOSIS — Z8669 Personal history of other diseases of the nervous system and sense organs: Secondary | ICD-10-CM | POA: Diagnosis not present

## 2014-01-05 HISTORY — DX: Esophagitis, unspecified: K20.9

## 2014-01-05 HISTORY — DX: Dizziness and giddiness: R42

## 2014-01-05 HISTORY — DX: Deep phlebothrombosis in pregnancy, unspecified trimester: O22.30

## 2014-01-05 HISTORY — DX: Anxiety disorder, unspecified: F41.9

## 2014-01-05 HISTORY — DX: Esophagitis, unspecified without bleeding: K20.90

## 2014-01-05 HISTORY — DX: Essential (primary) hypertension: I10

## 2014-01-05 LAB — I-STAT CHEM 8, ED
BUN: 15 mg/dL (ref 6–23)
CHLORIDE: 99 meq/L (ref 96–112)
CREATININE: 0.9 mg/dL (ref 0.50–1.10)
Calcium, Ion: 1.14 mmol/L (ref 1.12–1.23)
Glucose, Bld: 102 mg/dL — ABNORMAL HIGH (ref 70–99)
HCT: 40 % (ref 36.0–46.0)
Hemoglobin: 13.6 g/dL (ref 12.0–15.0)
POTASSIUM: 3.5 meq/L — AB (ref 3.7–5.3)
Sodium: 141 mEq/L (ref 137–147)
TCO2: 27 mmol/L (ref 0–100)

## 2014-01-05 LAB — CBC WITH DIFFERENTIAL/PLATELET
Basophils Absolute: 0 10*3/uL (ref 0.0–0.1)
Basophils Relative: 0 % (ref 0–1)
EOS ABS: 0.3 10*3/uL (ref 0.0–0.7)
Eosinophils Relative: 4 % (ref 0–5)
HCT: 37.4 % (ref 36.0–46.0)
Hemoglobin: 12.2 g/dL (ref 12.0–15.0)
LYMPHS ABS: 3.3 10*3/uL (ref 0.7–4.0)
Lymphocytes Relative: 39 % (ref 12–46)
MCH: 27.8 pg (ref 26.0–34.0)
MCHC: 32.6 g/dL (ref 30.0–36.0)
MCV: 85.2 fL (ref 78.0–100.0)
Monocytes Absolute: 0.6 10*3/uL (ref 0.1–1.0)
Monocytes Relative: 7 % (ref 3–12)
NEUTROS ABS: 4.2 10*3/uL (ref 1.7–7.7)
NEUTROS PCT: 50 % (ref 43–77)
PLATELETS: 267 10*3/uL (ref 150–400)
RBC: 4.39 MIL/uL (ref 3.87–5.11)
RDW: 14 % (ref 11.5–15.5)
WBC: 8.5 10*3/uL (ref 4.0–10.5)

## 2014-01-05 LAB — URIC ACID: URIC ACID, SERUM: 5.7 mg/dL (ref 2.4–7.0)

## 2014-01-05 LAB — SEDIMENTATION RATE: Sed Rate: 40 mm/hr — ABNORMAL HIGH (ref 0–22)

## 2014-01-05 MED ORDER — COLCHICINE 0.6 MG PO TABS: 0.6000 mg | ORAL_TABLET | Freq: Every day | ORAL | Status: DC

## 2014-01-05 MED ORDER — POTASSIUM CHLORIDE CRYS ER 20 MEQ PO TBCR
40.0000 meq | EXTENDED_RELEASE_TABLET | Freq: Once | ORAL | Status: AC
  Administered 2014-01-05: 40 meq via ORAL
  Filled 2014-01-05: qty 2

## 2014-01-05 MED ORDER — INDOMETHACIN 25 MG PO CAPS: 25.0000 mg | ORAL_CAPSULE | Freq: Three times a day (TID) | ORAL | Status: DC | PRN

## 2014-01-05 MED ORDER — COLCHICINE 0.6 MG PO TABS
1.2000 mg | ORAL_TABLET | Freq: Once | ORAL | Status: AC
  Administered 2014-01-05: 1.2 mg via ORAL
  Filled 2014-01-05: qty 2

## 2014-01-05 MED ORDER — KETOROLAC TROMETHAMINE 30 MG/ML IJ SOLN
30.0000 mg | Freq: Once | INTRAMUSCULAR | Status: AC
  Administered 2014-01-05: 30 mg via INTRAMUSCULAR
  Filled 2014-01-05: qty 1

## 2014-01-05 NOTE — Discharge Instructions (Signed)
Your testing today does not show any signs for a concerning or emergent cause for your pain and swelling. At this time your provider(s) feel your symptoms are caused from a gout flare up. Continue to use Rest, Ice and Elevation to reduce pain and swelling. Follow up with your doctor for a re-check on Monday. Return for any changing or worsening symptoms.   Gout Gout is an inflammatory arthritis caused by a buildup of uric acid crystals in the joints. Uric acid is a chemical that is normally present in the blood. When the level of uric acid in the blood is too high it can form crystals that deposit in your joints and tissues. This causes joint redness, soreness, and swelling (inflammation). Repeat attacks are common. Over time, uric acid crystals can form into masses (tophi) near a joint, destroying bone and causing disfigurement. Gout is treatable and often preventable. CAUSES  The disease begins with elevated levels of uric acid in the blood. Uric acid is produced by your body when it breaks down a naturally found substance called purines. Certain foods you eat, such as meats and fish, contain high amounts of purines. Causes of an elevated uric acid level include:  Being passed down from parent to child (heredity).  Diseases that cause increased uric acid production (such as obesity, psoriasis, and certain cancers).  Excessive alcohol use.  Diet, especially diets rich in meat and seafood.  Medicines, including certain cancer-fighting medicines (chemotherapy), water pills (diuretics), and aspirin.  Chronic kidney disease. The kidneys are no longer able to remove uric acid well.  Problems with metabolism. Conditions strongly associated with gout include:  Obesity.  High blood pressure.  High cholesterol.  Diabetes. Not everyone with elevated uric acid levels gets gout. It is not understood why some people get gout and others do not. Surgery, joint injury, and eating too much of certain  foods are some of the factors that can lead to gout attacks. SYMPTOMS   An attack of gout comes on quickly. It causes intense pain with redness, swelling, and warmth in a joint.  Fever can occur.  Often, only one joint is involved. Certain joints are more commonly involved:  Base of the big toe.  Knee.  Ankle.  Wrist.  Finger. Without treatment, an attack usually goes away in a few days to weeks. Between attacks, you usually will not have symptoms, which is different from many other forms of arthritis. DIAGNOSIS  Your caregiver will suspect gout based on your symptoms and exam. In some cases, tests may be recommended. The tests may include:  Blood tests.  Urine tests.  X-rays.  Joint fluid exam. This exam requires a needle to remove fluid from the joint (arthrocentesis). Using a microscope, gout is confirmed when uric acid crystals are seen in the joint fluid. TREATMENT  There are two phases to gout treatment: treating the sudden onset (acute) attack and preventing attacks (prophylaxis).  Treatment of an Acute Attack.  Medicines are used. These include anti-inflammatory medicines or steroid medicines.  An injection of steroid medicine into the affected joint is sometimes necessary.  The painful joint is rested. Movement can worsen the arthritis.  You may use warm or cold treatments on painful joints, depending which works best for you.  Treatment to Prevent Attacks.  If you suffer from frequent gout attacks, your caregiver may advise preventive medicine. These medicines are started after the acute attack subsides. These medicines either help your kidneys eliminate uric acid from your body or  decrease your uric acid production. You may need to stay on these medicines for a very long time.  The early phase of treatment with preventive medicine can be associated with an increase in acute gout attacks. For this reason, during the first few months of treatment, your caregiver  may also advise you to take medicines usually used for acute gout treatment. Be sure you understand your caregiver's directions. Your caregiver may make several adjustments to your medicine dose before these medicines are effective.  Discuss dietary treatment with your caregiver or dietitian. Alcohol and drinks high in sugar and fructose and foods such as meat, poultry, and seafood can increase uric acid levels. Your caregiver or dietitian can advise you on drinks and foods that should be limited. HOME CARE INSTRUCTIONS   Do not take aspirin to relieve pain. This raises uric acid levels.  Only take over-the-counter or prescription medicines for pain, discomfort, or fever as directed by your caregiver.  Rest the joint as much as possible. When in bed, keep sheets and blankets off painful areas.  Keep the affected joint raised (elevated).  Apply warm or cold treatments to painful joints. Use of warm or cold treatments depends on which works best for you.  Use crutches if the painful joint is in your leg.  Drink enough fluids to keep your urine clear or pale yellow. This helps your body get rid of uric acid. Limit alcohol, sugary drinks, and fructose drinks.  Follow your dietary instructions. Pay careful attention to the amount of protein you eat. Your daily diet should emphasize fruits, vegetables, whole grains, and fat-free or low-fat milk products. Discuss the use of coffee, vitamin C, and cherries with your caregiver or dietitian. These may be helpful in lowering uric acid levels.  Maintain a healthy body weight. SEEK MEDICAL CARE IF:   You develop diarrhea, vomiting, or any side effects from medicines.  You do not feel better in 24 hours, or you are getting worse. SEEK IMMEDIATE MEDICAL CARE IF:   Your joint becomes suddenly more tender, and you have chills or a fever. MAKE SURE YOU:   Understand these instructions.  Will watch your condition.  Will get help right away if you  are not doing well or get worse. Document Released: 05/21/2000 Document Revised: 10/08/2013 Document Reviewed: 01/05/2012 [facility] Patient Information 2015 [place], [place]. This information is not intended to replace advice given to you by your health care provider. Make sure you discuss any questions you have with your health care provider.
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
## 2014-01-05 NOTE — ED Notes (Signed)
Pt. reports left wrist pain with swelling onset yesterday morning , denies injury , pain radiating to fingers and forearm .

## 2014-01-05 NOTE — ED Provider Notes (Signed)
Medical screening examination/treatment/procedure(s) were performed by non-physician practitioner and as supervising physician I was immediately available for consultation/collaboration.   EKG Interpretation None        Sharyon Cable, MD 01/05/14 [phone]

## 2014-01-09 ENCOUNTER — Ambulatory Visit: Payer: Medicare Other | Admitting: Cardiovascular Disease

## 2014-01-09 ENCOUNTER — Telehealth: Payer: Self-pay | Admitting: Internal Medicine

## 2014-01-09 NOTE — Telephone Encounter (Signed)
Can put patient in 01/24/14 at 11.15

## 2014-01-09 NOTE — Telephone Encounter (Signed)
Pt called in and stated was seen in the hospital for gout and was needing to make a follow up appt.

## 2014-01-23 ENCOUNTER — Encounter: Payer: Self-pay | Admitting: Cardiovascular Disease

## 2014-01-23 ENCOUNTER — Ambulatory Visit (INDEPENDENT_AMBULATORY_CARE_PROVIDER_SITE_OTHER): Payer: Medicare Other | Admitting: Cardiovascular Disease

## 2014-01-23 VITALS — BP 110/80 | HR 59 | Ht 65.0 in | Wt 213.0 lb

## 2014-01-23 DIAGNOSIS — R079 Chest pain, unspecified: Secondary | ICD-10-CM

## 2014-01-23 DIAGNOSIS — R0602 Shortness of breath: Secondary | ICD-10-CM | POA: Diagnosis not present

## 2014-01-23 DIAGNOSIS — K2100 Gastro-esophageal reflux disease with esophagitis, without bleeding: Secondary | ICD-10-CM

## 2014-01-23 DIAGNOSIS — E785 Hyperlipidemia, unspecified: Secondary | ICD-10-CM

## 2014-01-23 DIAGNOSIS — Z8249 Family history of ischemic heart disease and other diseases of the circulatory system: Secondary | ICD-10-CM | POA: Diagnosis not present

## 2014-01-23 DIAGNOSIS — I471 Supraventricular tachycardia: Secondary | ICD-10-CM | POA: Diagnosis not present

## 2014-01-23 DIAGNOSIS — F4320 Adjustment disorder, unspecified: Secondary | ICD-10-CM | POA: Insufficient documentation

## 2014-01-23 DIAGNOSIS — F4321 Adjustment disorder with depressed mood: Secondary | ICD-10-CM

## 2014-01-23 DIAGNOSIS — E669 Obesity, unspecified: Secondary | ICD-10-CM

## 2014-01-23 NOTE — Assessment & Plan Note (Signed)
She is having a difficult time after the passing of her mother in February 2015. Recommended a more active lifestyle, starting a regular exercise regimen. Not sleeping well at times.

## 2014-01-23 NOTE — Assessment & Plan Note (Signed)
No recent arrhythmia on her current medication regimen. She does not seem to have side effects despite low heart rate and blood pressure. No medication changes made at this time

## 2014-01-23 NOTE — Patient Instructions (Addendum)
You are doing well. No medication changes were made.  You are scheduled for a Calcium CT Score on Fri, Aug 28 @ 3:00 Please arrive at 2:45 There is a fee of $150 due at the time of this procedure.   Please call us if you have new issues that need to be addressed before your next appt.  Your physician wants you to follow-up in: 6 months.  You will receive a reminder letter in the mail two months in advance. If you don't receive a letter, please call our office to schedule the follow-up appointment.

## 2014-01-23 NOTE — Assessment & Plan Note (Signed)
She stay on her simvastatin. Cholesterol likely higher in the setting of recent weight gain

## 2014-01-23 NOTE — Assessment & Plan Note (Signed)
We have encouraged continued exercise, careful diet management in an effort to lose weight. 

## 2014-01-23 NOTE — Progress Notes (Signed)
Patient ID: Tracey Wiley, female    DOB: 01/06/1956, 58 y.o.   MRN: [id number]  HPI Comments: Tracey Wiley is a very pleasant 58 year old woman with a  history of PFO,  with  closure device in April 2008 performed in [place], hyperlipidemia, PAT versus paroxysmal atrial fibrillation on sotalol, hypertension who presents for routine followup.   She continues to take sotalol one half pill 4 times a day. In general she does not report having episodes of palpitations or tachycardia. Taking this twice a day caused fatigue. Significant stress as she recently lost her mother in February 2015. He continues to have problems with adjusting Unable to tolerate long-acting Xanax as she had fatigue in the daytime She's not doing any regular exercise. In fact she is still tearful at times by her report, spending most of her days inside not doing anything. Weight has been trending upwards, up 10 pounds from her prior clinic visit. She is not eating well.  Continues to have significant problems with reflux. She does not feel that the Nexium twice a day is working for her. She's interested in trying other proton pump inhibitors She does not want a repeat EGD  Also reports having recent gout in her wrists. Improvement with colchicine but thinks it is coming back. Indomethacin upset her stomach  EKG shows normal sinus rhythm with rate 59 beats per minute, nonspecific T wave abnormality through the anterior precordial leads, lead 3 and aVF  .  Outpatient Encounter Prescriptions as of 01/23/2014  Medication Sig  . acetaminophen (TYLENOL) 500 MG tablet Take 500 mg by mouth daily as needed for pain.  Marland Kitchen ALPRAZolam (XANAX) 0.5 MG tablet Take 0.5 mg by mouth 2 (two) times daily as needed for anxiety.   Marland Kitchen aspirin EC 81 MG tablet Take 81 mg by mouth daily.  Marland Kitchen CALCIUM PO Take 1 tablet by mouth daily.  . Cholecalciferol (VITAMIN D PO) Take 1 tablet by mouth daily.  . colchicine 0.6 MG tablet Take 1 tablet (0.6 mg  total) by mouth daily.  Marland Kitchen esomeprazole (NEXIUM) 40 MG capsule Take 40 mg by mouth 2 (two) times daily before a meal.   . Flaxseed, Linseed, (FLAX SEEDS PO) Take 1 tablet by mouth daily.  . indomethacin (INDOCIN) 25 MG capsule Take 1 capsule (25 mg total) by mouth 3 (three) times daily as needed.  Marland Kitchen LYSINE PO Take 1 tablet by mouth daily.  Marland Kitchen MAGNESIUM PO Take 1 tablet by mouth daily.  Marland Kitchen MILK THISTLE EXTRACT PO Take 1 tablet by mouth daily.  . Misc Natural Products (OSTEO BI-FLEX JOINT SHIELD PO) Take 1 tablet by mouth daily.  . nitroGLYCERIN (NITROSTAT) 0.4 MG SL tablet Place 1 tablet (0.4 mg total) under the tongue every 5 (five) minutes as needed for chest pain.  . Omega-3 Fatty Acids (FISH OIL PO) Take 1 tablet by mouth daily.  . simvastatin (ZOCOR) 40 MG tablet Take 40 mg by mouth daily.  . sotalol (BETAPACE) 80 MG tablet Take 80 mg by mouth 2 (two) times daily.  Marland Kitchen triamterene-hydrochlorothiazide (DYAZIDE) 37.5-25 MG per capsule Take 1 capsule by mouth daily.    Review of Systems  Constitutional: Negative.   HENT: Negative.   Eyes: Negative.   Respiratory: Negative.   Cardiovascular: Negative.   Gastrointestinal: Negative.        GERD symptoms  Endocrine: Negative.   Musculoskeletal: Negative.        Wrist pain from gout  Skin: Negative.   Allergic/Immunologic:  Negative.   Neurological: Negative.   Hematological: Negative.   Psychiatric/Behavioral: Negative.   All other systems reviewed and are negative.  BP 110/80  Pulse 59  Ht 5\' 5"  (1.651 m)  Wt 213 lb (96.616 kg)  BMI 35.44 kg/m2  Physical Exam  Nursing note and vitals reviewed. Constitutional: She is oriented to person, place, and time. She appears well-developed and well-nourished.  Obese  HENT:  Head: Normocephalic.  Nose: Nose normal.  Mouth/Throat: Oropharynx is clear and moist.  Eyes: Conjunctivae are normal. Pupils are equal, round, and reactive to light.  Neck: Normal range of motion. Neck supple. No JVD  present.  Cardiovascular: Normal rate, regular rhythm, S1 normal, S2 normal, normal heart sounds and intact distal pulses.  Exam reveals no gallop and no friction rub.   No murmur heard. Pulmonary/Chest: Effort normal and breath sounds normal. No respiratory distress. She has no wheezes. She has no rales. She exhibits no tenderness.  Abdominal: Soft. Bowel sounds are normal. She exhibits no distension. There is no tenderness.  Musculoskeletal: Normal range of motion. She exhibits no edema and no tenderness.  Lymphadenopathy:    She has no cervical adenopathy.  Neurological: She is alert and oriented to person, place, and time. Coordination normal.  Skin: Skin is warm and dry. No rash noted. No erythema.  Psychiatric: She has a normal mood and affect. Her behavior is normal. Judgment and thought content normal.    Assessment and Plan

## 2014-01-23 NOTE — Assessment & Plan Note (Signed)
Recommended she talk with Dr. Derrel Nip tomorrow. Perhaps she might benefit from a trial of Protonix or a retrial of dexilant.

## 2014-01-24 ENCOUNTER — Encounter: Payer: Self-pay | Admitting: Internal Medicine

## 2014-01-24 ENCOUNTER — Ambulatory Visit (INDEPENDENT_AMBULATORY_CARE_PROVIDER_SITE_OTHER): Payer: Medicare Other | Admitting: Internal Medicine

## 2014-01-24 VITALS — BP 120/74 | HR 60 | Temp 97.9°F | Resp 14 | Ht 65.0 in | Wt 211.5 lb

## 2014-01-24 DIAGNOSIS — R5381 Other malaise: Secondary | ICD-10-CM | POA: Diagnosis not present

## 2014-01-24 DIAGNOSIS — Z79899 Other long term (current) drug therapy: Secondary | ICD-10-CM

## 2014-01-24 DIAGNOSIS — E559 Vitamin D deficiency, unspecified: Secondary | ICD-10-CM | POA: Diagnosis not present

## 2014-01-24 DIAGNOSIS — Z8739 Personal history of other diseases of the musculoskeletal system and connective tissue: Secondary | ICD-10-CM

## 2014-01-24 DIAGNOSIS — E785 Hyperlipidemia, unspecified: Secondary | ICD-10-CM

## 2014-01-24 DIAGNOSIS — K2100 Gastro-esophageal reflux disease with esophagitis, without bleeding: Secondary | ICD-10-CM

## 2014-01-24 DIAGNOSIS — E669 Obesity, unspecified: Secondary | ICD-10-CM

## 2014-01-24 DIAGNOSIS — R5383 Other fatigue: Secondary | ICD-10-CM

## 2014-01-24 DIAGNOSIS — F411 Generalized anxiety disorder: Secondary | ICD-10-CM

## 2014-01-24 DIAGNOSIS — E876 Hypokalemia: Secondary | ICD-10-CM | POA: Diagnosis not present

## 2014-01-24 DIAGNOSIS — F4323 Adjustment disorder with mixed anxiety and depressed mood: Secondary | ICD-10-CM

## 2014-01-24 DIAGNOSIS — M109 Gout, unspecified: Secondary | ICD-10-CM

## 2014-01-24 LAB — LIPID PANEL
CHOL/HDL RATIO: 4
Cholesterol: 184 mg/dL (ref 0–200)
HDL: 47.1 mg/dL (ref >39.00)
LDL CALC: 109 mg/dL — AB (ref 0–99)
NonHDL: 136.9
TRIGLYCERIDES: 140 mg/dL (ref 0.0–149.0)
VLDL: 28 mg/dL (ref 0.0–40.0)

## 2014-01-24 LAB — COMPREHENSIVE METABOLIC PANEL
ALBUMIN: 3.9 g/dL (ref 3.5–5.2)
ALT: 18 U/L (ref 0–35)
AST: 24 U/L (ref 0–37)
Alkaline Phosphatase: 67 U/L (ref 39–117)
BUN: 14 mg/dL (ref 6–23)
CALCIUM: 9.2 mg/dL (ref 8.4–10.5)
CHLORIDE: 101 meq/L (ref 96–112)
CO2: 31 mEq/L (ref 19–32)
Creatinine, Ser: 0.8 mg/dL (ref 0.4–1.2)
GFR: 78.85 mL/min (ref >60.00)
Glucose, Bld: 89 mg/dL (ref 70–99)
POTASSIUM: 4.2 meq/L (ref 3.5–5.1)
Sodium: 139 mEq/L (ref 135–145)
Total Bilirubin: 0.7 mg/dL (ref 0.2–1.2)
Total Protein: 7.1 g/dL (ref 6.0–8.3)

## 2014-01-24 LAB — URIC ACID: Uric Acid, Serum: 5.7 mg/dL (ref 2.4–7.0)

## 2014-01-24 LAB — VITAMIN D 25 HYDROXY (VIT D DEFICIENCY, FRACTURES): VITD: 37.84 ng/mL (ref 30.00–100.00)

## 2014-01-24 LAB — TSH: TSH: 1.67 u[IU]/mL (ref 0.35–4.50)

## 2014-01-24 LAB — MAGNESIUM: MAGNESIUM: 2.1 mg/dL (ref 1.5–2.5)

## 2014-01-24 MED ORDER — COLCHICINE 0.6 MG PO TABS: 0.6000 mg | ORAL_TABLET | Freq: Every day | ORAL | Status: DC

## 2014-01-24 MED ORDER — ALPRAZOLAM 0.5 MG PO TABS: 0.5000 mg | ORAL_TABLET | Freq: Every evening | ORAL | Status: DC | PRN

## 2014-01-24 MED ORDER — ALPRAZOLAM 0.5 MG PO TABS
0.5000 mg | ORAL_TABLET | Freq: Every evening | ORAL | Status: DC | PRN
Start: 2014-01-24 — End: 2014-01-24

## 2014-01-24 NOTE — Progress Notes (Signed)
Pre visit review using our clinic review tool, if applicable. No additional management support is needed unless otherwise documented below in the visit note. 

## 2014-01-24 NOTE — Patient Instructions (Signed)
You are doing well, in spite of your grief  I agree with changing the alprazolam to immediate release at bedtime  I have refilled the colchicine for another 3 weeks.  You might try icing your wrist to help with the swelling  If your uric acid level is > 8 we may start allopurinol daily   If your potassium is < 3.6 we may start potassium chloride supplement daiy
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
## 2014-01-24 NOTE — Assessment & Plan Note (Addendum)
secondary to use of maxzide for vertigo .  Will supplement daily only  if repeat K is < 3.5  Lab Results  Component Value Date   NA 139 01/24/2014   K 4.2 01/24/2014   CL 101 01/24/2014   CO2 31 01/24/2014

## 2014-01-26 ENCOUNTER — Encounter: Payer: Self-pay | Admitting: Internal Medicine

## 2014-01-26 ENCOUNTER — Telehealth: Payer: Self-pay | Admitting: Internal Medicine

## 2014-01-26 DIAGNOSIS — M109 Gout, unspecified: Secondary | ICD-10-CM | POA: Insufficient documentation

## 2014-01-26 NOTE — Telephone Encounter (Signed)
Your cholesterol,thyroid, uric acid, potassium,  liver and kidney function are normal.  You do not need any medication changes. Please plan to repeat the labs in 6 months.    Regards,   Dr. Derrel Nip

## 2014-01-26 NOTE — Assessment & Plan Note (Signed)
Change in anxiolytic,  stopping alprazolam Er and limiting use of alprazolam to nighttime .

## 2014-01-26 NOTE — Assessment & Plan Note (Signed)
contineu colchicine for one more week.  Ice wrist for 15 min  Tid.  Check uric acid level and add allopurinol if level is ver 6

## 2014-01-26 NOTE — Assessment & Plan Note (Signed)
Easily aggravated desite twice daily nexium.  Discussed trial of dexilant which would likely be cost prohibitive, vs protonix

## 2014-01-26 NOTE — Assessment & Plan Note (Addendum)
With binge eating and insomnia becoming problematic.

## 2014-01-26 NOTE — Assessment & Plan Note (Addendum)
I have addressed  BMI and recommended wt loss of 10% of body weigh over the next 6 months using a low glycemic index diet and regular exercise a minimum of 5 days per week. Her companion is her Physiological scientist and has opened his gym recently so she has access to training but lacks self control with eating .

## 2014-01-28 ENCOUNTER — Encounter: Payer: Self-pay | Admitting: *Deleted

## 2014-01-28 NOTE — Telephone Encounter (Signed)
Letter mailed

## 2014-02-01 ENCOUNTER — Ambulatory Visit (INDEPENDENT_AMBULATORY_CARE_PROVIDER_SITE_OTHER)
Admission: RE | Admit: 2014-02-01 | Discharge: 2014-02-01 | Disposition: A | Discharge status: Home / Self Care | Payer: Self-pay | Source: Ambulatory Visit | Attending: Cardiovascular Disease | Admitting: Cardiovascular Disease

## 2014-02-01 DIAGNOSIS — R0602 Shortness of breath: Secondary | ICD-10-CM

## 2014-02-01 DIAGNOSIS — R079 Chest pain, unspecified: Secondary | ICD-10-CM

## 2014-02-01 DIAGNOSIS — Z8249 Family history of ischemic heart disease and other diseases of the circulatory system: Secondary | ICD-10-CM

## 2014-02-15 ENCOUNTER — Telehealth: Payer: Self-pay | Admitting: Internal Medicine

## 2014-02-15 DIAGNOSIS — M10079 Idiopathic gout, unspecified ankle and foot: Secondary | ICD-10-CM

## 2014-02-15 MED ORDER — COLCHICINE 0.6 MG PO TABS: 0.6000 mg | ORAL_TABLET | Freq: Every day | ORAL | Status: DC

## 2014-02-15 NOTE — Telephone Encounter (Signed)
Colchicine refill sent to [facility] in [place]  Since this is her 3rd epiosde in a year, she needs to have her uric acid level repeated  once her flare is over.  She may need preventive medication

## 2014-02-15 NOTE — Telephone Encounter (Signed)
Pt notified and verbalized understanding.

## 2014-02-15 NOTE — Telephone Encounter (Signed)
Pt called in and stated she thinks she is having a gout flare up, stated on her big toe is warm, tender and pink and was wanting to see if she can get a refill of of colchicine 0.6mg  and was wondering if she needs to be seen again.

## 2014-03-04 ENCOUNTER — Other Ambulatory Visit: Payer: Self-pay

## 2014-03-04 MED ORDER — SIMVASTATIN 40 MG PO TABS: 40.0000 mg | ORAL_TABLET | Freq: Every day | ORAL | Status: DC

## 2014-03-04 NOTE — Telephone Encounter (Signed)
Refill sent for zocor

## 2014-03-14 ENCOUNTER — Ambulatory Visit (INDEPENDENT_AMBULATORY_CARE_PROVIDER_SITE_OTHER): Payer: Medicare Other

## 2014-03-14 DIAGNOSIS — Z23 Encounter for immunization: Secondary | ICD-10-CM

## 2014-04-09 ENCOUNTER — Encounter: Payer: Self-pay | Admitting: Family Medicine

## 2014-04-09 ENCOUNTER — Ambulatory Visit (INDEPENDENT_AMBULATORY_CARE_PROVIDER_SITE_OTHER): Payer: Medicare Other | Admitting: Family Medicine

## 2014-04-09 VITALS — BP 120/80 | HR 75 | Temp 98.1°F | Wt 216.0 lb

## 2014-04-09 DIAGNOSIS — J209 Acute bronchitis, unspecified: Secondary | ICD-10-CM | POA: Diagnosis not present

## 2014-04-09 MED ORDER — ALBUTEROL SULFATE HFA 108 (90 BASE) MCG/ACT IN AERS: 2.0000 | INHALATION_SPRAY | Freq: Four times a day (QID) | RESPIRATORY_TRACT | Status: DC | PRN

## 2014-04-09 MED ORDER — BENZONATATE 200 MG PO CAPS: 200.0000 mg | ORAL_CAPSULE | Freq: Three times a day (TID) | ORAL | Status: DC | PRN

## 2014-04-09 MED ORDER — DOXYCYCLINE HYCLATE 100 MG PO TABS: 100.0000 mg | ORAL_TABLET | Freq: Two times a day (BID) | ORAL | Status: DC

## 2014-04-09 NOTE — Patient Instructions (Signed)
Start the doxy today, use tessalon for the cough and albuterol for the wheeze.  Take care.  Glad to see you.

## 2014-04-09 NOTE — Progress Notes (Signed)
Pre visit review using our clinic review tool, if applicable. No additional management support is needed unless otherwise documented below in the visit note.  Recently with a gout flare, has been recurrent, she is going to f/u with PCP about that.    Sx started about 4-5 days ago.  She needed more SABA.  Worsening asthma sx.  Had been out raking leaves, then dusting.  Added on mucinex and tessalon recently.  More recently with ST, mildly elevated temps.  HA, "my whole head hurts."  More cough in the last few days, with sputum.  Still wheezing.  Worse cough/wheeze from 8PM and midnight.  Needs a refill on SABA.  H/o PNA x2 noted.   Meds, vitals, and allergies reviewed.   ROS: See HPI.  Otherwise, noncontributory.  GEN: nad, alert and oriented HEENT: mucous membranes moist, tm w/o erythema, nasal exam w/o erythema, scabbed blood in R nostril, clear discharge noted,  OP with cobblestoning, max sinuses mildly ttp NECK: supple w/o LA CV: rrr.   PULM: ctab but cough noted, no inc wob, no wheeze at time of exam EXT: no edema SKIN: no acute rash

## 2014-04-10 NOTE — Assessment & Plan Note (Signed)
Given her hx, would treat.  D/w pt.  Start doxy, use tessalon and SABA prn for cough.  Nontoxic.  She'll f/u with PCP, okay for outpatient f/u.  D/w pt.  She agrees.

## 2014-04-15 ENCOUNTER — Ambulatory Visit (INDEPENDENT_AMBULATORY_CARE_PROVIDER_SITE_OTHER): Payer: Medicare Other | Admitting: Internal Medicine

## 2014-04-15 ENCOUNTER — Encounter: Payer: Self-pay | Admitting: Internal Medicine

## 2014-04-15 VITALS — BP 126/72 | HR 78 | Temp 97.6°F | Resp 16 | Ht 65.0 in | Wt 217.0 lb

## 2014-04-15 DIAGNOSIS — Z1239 Encounter for other screening for malignant neoplasm of breast: Secondary | ICD-10-CM

## 2014-04-15 DIAGNOSIS — M1000 Idiopathic gout, unspecified site: Secondary | ICD-10-CM | POA: Diagnosis not present

## 2014-04-15 DIAGNOSIS — J209 Acute bronchitis, unspecified: Secondary | ICD-10-CM | POA: Diagnosis not present

## 2014-04-15 MED ORDER — METHYLPREDNISOLONE ACETATE 40 MG/ML IJ SUSP
40.0000 mg | Freq: Once | INTRAMUSCULAR | Status: AC
  Administered 2014-04-15: 40 mg via INTRAMUSCULAR

## 2014-04-15 MED ORDER — HYOSCYAMINE SULFATE 0.125 MG SL SUBL: 0.1250 mg | SUBLINGUAL_TABLET | SUBLINGUAL | Status: DC | PRN

## 2014-04-15 MED ORDER — PREDNISONE (PAK) 10 MG PO TABS: ORAL_TABLET | ORAL | Status: DC

## 2014-04-15 MED ORDER — COLCHICINE 0.6 MG PO TABS: 0.6000 mg | ORAL_TABLET | Freq: Every day | ORAL | Status: DC
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
## 2014-04-15 NOTE — Patient Instructions (Addendum)
Try using Sudafed PE 10 to 30 mg every 6 hours for the sinus congestion  Try inhaling some steam from a hot bowel of water to help the congestion   I am placing you on a prednisone taper for the next 6 days..  You can resume the colchrys when you get to the last 2 days of prednisone  We will call [facility] about your last mammogram and if the last one was done as a screening I will order it for you   You received a 40 mg dose of Depo Medrol today

## 2014-04-15 NOTE — Progress Notes (Signed)
Pre-visit discussion using our clinic review tool. No additional management support is needed unless otherwise documented below in the visit note.  

## 2014-04-17 NOTE — Addendum Note (Signed)
Addended by: Crecencio Mc on: 04/17/2014 10:24 PM   Modules accepted: Orders

## 2014-04-17 NOTE — Assessment & Plan Note (Signed)
Prednisone taper , sudafed for congestion

## 2014-04-17 NOTE — Assessment & Plan Note (Signed)
Prednisone taper prescribed for gout flare.

## 2014-05-06 ENCOUNTER — Other Ambulatory Visit: Payer: Self-pay | Admitting: Internal Medicine

## 2014-05-06 NOTE — Telephone Encounter (Signed)
Ok to refill,  Refill sent  

## 2014-05-06 NOTE — Telephone Encounter (Signed)
Ok refill? 

## 2014-05-09 DIAGNOSIS — Z1231 Encounter for screening mammogram for malignant neoplasm of breast: Secondary | ICD-10-CM | POA: Diagnosis not present

## 2014-05-09 LAB — HM MAMMOGRAPHY: HM Mammogram: NEGATIVE

## 2014-05-10 ENCOUNTER — Ambulatory Visit (INDEPENDENT_AMBULATORY_CARE_PROVIDER_SITE_OTHER): Payer: Medicare Other | Admitting: Internal Medicine

## 2014-05-10 ENCOUNTER — Encounter: Payer: Self-pay | Admitting: Internal Medicine

## 2014-05-10 VITALS — BP 120/80 | HR 71 | Temp 98.7°F | Resp 16 | Ht 65.0 in | Wt 215.5 lb

## 2014-05-10 DIAGNOSIS — R938 Abnormal findings on diagnostic imaging of other specified body structures: Secondary | ICD-10-CM | POA: Diagnosis not present

## 2014-05-10 DIAGNOSIS — R635 Abnormal weight gain: Secondary | ICD-10-CM

## 2014-05-10 DIAGNOSIS — E669 Obesity, unspecified: Secondary | ICD-10-CM

## 2014-05-10 DIAGNOSIS — R911 Solitary pulmonary nodule: Secondary | ICD-10-CM | POA: Diagnosis not present

## 2014-05-10 DIAGNOSIS — M10232 Drug-induced gout, left wrist: Secondary | ICD-10-CM | POA: Diagnosis not present

## 2014-05-10 DIAGNOSIS — Z79899 Other long term (current) drug therapy: Secondary | ICD-10-CM | POA: Diagnosis not present

## 2014-05-10 DIAGNOSIS — Z315 Encounter for genetic counseling: Secondary | ICD-10-CM

## 2014-05-10 DIAGNOSIS — E041 Nontoxic single thyroid nodule: Secondary | ICD-10-CM

## 2014-05-10 DIAGNOSIS — Z8639 Personal history of other endocrine, nutritional and metabolic disease: Secondary | ICD-10-CM

## 2014-05-10 DIAGNOSIS — Z1379 Encounter for other screening for genetic and chromosomal anomalies: Secondary | ICD-10-CM

## 2014-05-10 DIAGNOSIS — E236 Other disorders of pituitary gland: Secondary | ICD-10-CM

## 2014-05-10 DIAGNOSIS — R9389 Abnormal findings on diagnostic imaging of other specified body structures: Secondary | ICD-10-CM
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
## 2014-05-10 NOTE — Progress Notes (Signed)
Pre-visit discussion using our clinic review tool. No additional management support is needed unless otherwise documented below in the visit note.  

## 2014-05-11 ENCOUNTER — Encounter: Payer: Self-pay | Admitting: Internal Medicine

## 2014-05-11 NOTE — Assessment & Plan Note (Signed)
Because of her family history of ovarian cancer and colon cancer she had genetic  testing done prior to moving from [place]. She has been seeking closer surveillance for breast cancer and ovarian/cervical cancer then her current gynecologist is willing to do. She was told by the genetic counselor that she needed to have a Pap smear annually and  endometrial biopsy every 2 years but her current gynecologist refuses to do this. I have  refer her to Dr. Waynetta Pean for second opinion but her encounter has been postponed multiple times by his office and se is quite frustrated. . I have ordered her mammogram which is done annually by  [facility]. She has had an MRI of the breast in the recent past by [facility].Will refer to Dr Servando Salina

## 2014-05-11 NOTE — Assessment & Plan Note (Signed)
Needing endocrine follow up and opinion .

## 2014-05-11 NOTE — Assessment & Plan Note (Signed)
Patient concerned that her inability to lose weight is due to cushing's syndrome, which she has been reading about at the suggestion of a friend. . Advised her that her obesity is due to inadequate exercise due to her chronic health problems,  And that she has no objective evidence of Cushing's syndrome. , but she is requesting to have the diagnosis ruled out.  t endocrine referral in process to rule out diagnosis.

## 2014-05-11 NOTE — Assessment & Plan Note (Signed)
Ground glass opacities resolved by repeat Chest Ct Fb 2015.  pulmonatr nodule is unchanged and considered benign.

## 2014-05-11 NOTE — Assessment & Plan Note (Signed)
Unchanged by serial CTs.  Considered benign

## 2014-05-11 NOTE — Assessment & Plan Note (Addendum)
Needing endocrine opinion on need for biopsy.

## 2014-05-11 NOTE — Assessment & Plan Note (Signed)
Prednisone taper prescribed for gout flare resolved symptoms.  Uric acid, CRP and ESR repeated today  No results found for: URICACID No results found for: CRP Lab Results  Component Value Date   ESRSEDRATE 40* 01/05/2014   .

## 2014-05-12 LAB — C-REACTIVE PROTEIN: CRP: 1.8 mg/dL (ref 0.5–20.0)

## 2014-05-12 LAB — URIC ACID: Uric Acid, Serum: 6.1 mg/dL (ref 2.4–7.0)

## 2014-05-12 LAB — BASIC METABOLIC PANEL
BUN: 15 mg/dL (ref 6–23)
CO2: 29 mEq/L (ref 19–32)
CREATININE: 0.9 mg/dL (ref 0.4–1.2)
Calcium: 9.2 mg/dL (ref 8.4–10.5)
Chloride: 102 mEq/L (ref 96–112)
GFR: 70.56 mL/min (ref >60.00)
GLUCOSE: 112 mg/dL — AB (ref 70–99)
POTASSIUM: 4.1 meq/L (ref 3.5–5.1)
Sodium: 140 mEq/L (ref 135–145)

## 2014-05-13 ENCOUNTER — Telehealth: Payer: Self-pay

## 2014-05-13 LAB — SEDIMENTATION RATE: Sed Rate: 50 mm/hr — ABNORMAL HIGH (ref 0–22)

## 2014-05-13 NOTE — Telephone Encounter (Signed)
Please advise 

## 2014-05-13 NOTE — Telephone Encounter (Signed)
The uric acid level is 6.1 which is borderline.  Resume the medication

## 2014-05-13 NOTE — Telephone Encounter (Signed)
Left detailed message per DPR  

## 2014-05-13 NOTE — Telephone Encounter (Signed)
The patient called and is hoping to get the results of her uric acid lab test.  Thanks!

## 2014-05-21 ENCOUNTER — Ambulatory Visit: Payer: Medicare Other | Admitting: Endocrinology

## 2014-05-23 ENCOUNTER — Ambulatory Visit (INDEPENDENT_AMBULATORY_CARE_PROVIDER_SITE_OTHER): Payer: Medicare Other | Admitting: Endocrinology

## 2014-05-23 ENCOUNTER — Encounter: Payer: Self-pay | Admitting: Endocrinology

## 2014-05-23 VITALS — BP 138/82 | HR 70 | Ht 65.0 in | Wt 215.8 lb

## 2014-05-23 DIAGNOSIS — E236 Other disorders of pituitary gland: Secondary | ICD-10-CM | POA: Diagnosis not present

## 2014-05-23 DIAGNOSIS — E669 Obesity, unspecified: Secondary | ICD-10-CM

## 2014-05-23 DIAGNOSIS — E041 Nontoxic single thyroid nodule: Secondary | ICD-10-CM | POA: Diagnosis not present

## 2014-05-23 NOTE — Assessment & Plan Note (Signed)
We discussed that the last imaging done in 2012 doesn't have any detectable abnormality in the area of the pituitary. Based on symptoms, there is no strong suspicion for pituitary dysfunction. Will however evaluate for Cushings given recent rapid weight gain at next visit.  Continue to monitor symptoms for now. If evidence of any pertinent lab abnormality -then will consider MRI pituitary for follow up.    

## 2014-05-23 NOTE — Assessment & Plan Note (Signed)
Last Thyroid US done 05/2013. We discussed at length about thyroid nodules, etiology, follow up and possible pathology of thyroid nodules. She has small nodules. Lats US showed some growth on left nodule. Will obtain follow up thyroid US this year to assess growth. If that nodule meets size criteria for FNA, then would proceed with FNA.  She doesn't have compressive symptoms and will continue to monitor for now.  Update free T4 , TSH with next set of labs.

## 2014-05-23 NOTE — Patient Instructions (Signed)
Thyroid - order thyroid Ultrasound at [facility] for follow up.  Check TSH, free T4 with next set of labs in 2 months  Pituitary - continue to monitor for now. Evaluate for Cushing's at next visit ( due to recent steroid taper).   Obesity- as above. Work on diet. Aim to increase exercise.  Screen for A1c.   Please come back for a follow-up appointment in 2 months.
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
## 2014-05-23 NOTE — Progress Notes (Signed)
Pre visit review using our clinic review tool, if applicable. No additional management support is needed unless otherwise documented below in the visit note. 

## 2014-05-23 NOTE — Assessment & Plan Note (Addendum)
She has had rapid weight gain over the past year that is mainly central in nature. Clinically doesn't appear as Cushingoid. Recent steroid use last month. Would wait for 2 months before proceeding with further screening for Cushing's. Will also test for associated comorbidities like DM at next visit.  Encouraged her to increase exercise component and include aerobic component as tolerated.

## 2014-05-28 ENCOUNTER — Ambulatory Visit: Payer: Self-pay | Admitting: Endocrinology

## 2014-05-28 DIAGNOSIS — E041 Nontoxic single thyroid nodule: Secondary | ICD-10-CM | POA: Diagnosis not present

## 2014-06-03 ENCOUNTER — Telehealth: Payer: Self-pay

## 2014-06-03 NOTE — Telephone Encounter (Signed)
Exam date: 05/28/14  Findings: There is relative homogeneity the thyroid parenchymal echotexture. No definitive new enlarging thyroid nodule.  Right thyroid nodule  Measurements: normal in size measuring 4.4 x 1.8x 1.6 cm.  There are several punctate (approximately 2 mm) anechoic cyst scattered within the right lobe of the thyroid- these nodules appear grossly unchanged in hind sign since proir examination.  Left thyroid lobe  Measurements: Normal in size measuring 4.3 x 1.2 x 1.8 cm.  Left, superior, medial- 1.0 x 0.5 x 0.8 cm- hypoechoic, likely cystic, unchanged to minimally decrease in size, previously, 1.0 x 1.0 x 0.7 cm.   Isthmus  Thickness: Normal in size measures 0.3 cm in diameter.  No discrete nodule are identified within the thyroid isthmus  Lymphadenopathy  None visualized.  Impression: The dominant approximately 1 cm likely cystic nodule within the superior medial aspect of the left lobe of the thyroid is unchanged to minimally decreased in size in the interval. No definitive new or enlarging thyroid nodules

## 2014-06-05 DIAGNOSIS — Z8041 Family history of malignant neoplasm of ovary: Secondary | ICD-10-CM | POA: Diagnosis not present

## 2014-06-05 DIAGNOSIS — R638 Other symptoms and signs concerning food and fluid intake: Secondary | ICD-10-CM | POA: Diagnosis not present

## 2014-06-05 DIAGNOSIS — Z780 Asymptomatic menopausal state: Secondary | ICD-10-CM | POA: Diagnosis not present

## 2014-06-05 DIAGNOSIS — Z01419 Encounter for gynecological examination (general) (routine) without abnormal findings: Secondary | ICD-10-CM | POA: Diagnosis not present

## 2014-06-05 DIAGNOSIS — Z1211 Encounter for screening for malignant neoplasm of colon: Secondary | ICD-10-CM | POA: Diagnosis not present

## 2014-06-05 DIAGNOSIS — Z800 Family history of malignant neoplasm of digestive organs: Secondary | ICD-10-CM | POA: Diagnosis not present

## 2014-06-05 NOTE — Telephone Encounter (Signed)
Thanks for sending me her results.  Looks like the US shows stable findings. Please let her know. Will discuss more at time of follow up, but her thyroid nodules can be monitored without FNA at this time.

## 2014-06-06 NOTE — Telephone Encounter (Signed)
Spoke to patient to notify her of Dr. Boyd Kerbs comments. Patient verbalized understanding.

## 2014-06-17 ENCOUNTER — Other Ambulatory Visit: Payer: Self-pay | Admitting: Cardiovascular Disease

## 2014-06-23 ENCOUNTER — Other Ambulatory Visit: Payer: Self-pay | Admitting: Cardiovascular Disease

## 2014-07-10 ENCOUNTER — Encounter: Payer: Self-pay | Admitting: Endocrinology

## 2014-07-25 ENCOUNTER — Ambulatory Visit (INDEPENDENT_AMBULATORY_CARE_PROVIDER_SITE_OTHER): Payer: Medicare Other | Admitting: Endocrinology

## 2014-07-25 ENCOUNTER — Encounter: Payer: Self-pay | Admitting: Endocrinology

## 2014-07-25 VITALS — BP 136/82 | HR 72 | Resp 14 | Ht 65.0 in | Wt 220.8 lb

## 2014-07-25 DIAGNOSIS — G8929 Other chronic pain: Secondary | ICD-10-CM | POA: Diagnosis not present

## 2014-07-25 DIAGNOSIS — E669 Obesity, unspecified: Secondary | ICD-10-CM | POA: Diagnosis not present

## 2014-07-25 DIAGNOSIS — E041 Nontoxic single thyroid nodule: Secondary | ICD-10-CM

## 2014-07-25 DIAGNOSIS — E236 Other disorders of pituitary gland: Secondary | ICD-10-CM | POA: Diagnosis not present

## 2014-07-25 DIAGNOSIS — R7309 Other abnormal glucose: Secondary | ICD-10-CM | POA: Diagnosis not present

## 2014-07-25 DIAGNOSIS — M25522 Pain in left elbow: Secondary | ICD-10-CM

## 2014-07-25 DIAGNOSIS — R7303 Prediabetes: Secondary | ICD-10-CM

## 2014-07-25 LAB — T4, FREE: Free T4: 0.91 ng/dL (ref 0.60–1.60)

## 2014-07-25 LAB — HEMOGLOBIN A1C: Hgb A1c MFr Bld: 6.3 % (ref 4.6–6.5)

## 2014-07-25 LAB — TSH: TSH: 2.07 u[IU]/mL (ref 0.35–4.50)

## 2014-07-25 LAB — T3, FREE: T3, Free: 3.1 pg/mL (ref 2.3–4.2)

## 2014-07-25 LAB — URIC ACID: Uric Acid, Serum: 5.1 mg/dL (ref 2.4–7.0)
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
## 2014-07-25 NOTE — Patient Instructions (Addendum)
Collect 24 hour urine - per protocol  To rule out for cushing's.  Labs today for thyroid, screen for DM.   Please come back for a follow-up appointment in 3 months

## 2014-07-25 NOTE — Assessment & Plan Note (Signed)
We discussed that the last imaging done in 2012 doesn't have any detectable abnormality in the area of the pituitary. Based on symptoms, there is no strong suspicion for pituitary dysfunction. Will however evaluate for Cushings given recent rapid weight gain at next visit.  Continue to monitor symptoms for now. If evidence of any pertinent lab abnormality -then will consider MRI pituitary for follow up.

## 2014-07-25 NOTE — Assessment & Plan Note (Signed)
She has had rapid weight gain over the past year that is mainly central in nature. Clinically doesn't appear as Cushingoid. Recent steroid use Nov 2015. Screen for subclinical Cushing's with 24 hour urine creatinine and cortisol. Screen for progression of preDM and uric acid ( at her request) given chronic left elbow pain.  Encouraged her to increase exercise component and include aerobic component as tolerated.

## 2014-07-25 NOTE — Assessment & Plan Note (Signed)
Last Thyroid US done 05/2014. She has small nodules with overall stable left nodule that doesn't meet the criteria for FNA. Continue to monitor and repeat US in 1-2 years.   She doesn't have compressive symptoms and will continue to monitor for now.  Update free T4 , free T3, TSHtoday.

## 2014-07-26 ENCOUNTER — Telehealth: Payer: Self-pay | Admitting: *Deleted

## 2014-07-26 MED ORDER — FUROSEMIDE 20 MG PO TABS: 20.0000 mg | ORAL_TABLET | Freq: Every day | ORAL | Status: DC

## 2014-07-26 NOTE — Telephone Encounter (Signed)
Pt is stating she is retaining fluid. Made pt an apt this coming Wednesday with doctor Gollan for it was also time for her apt. Needs to be checked   Pt c/o swelling: STAT is pt has developed SOB within 24 hours  1. How long have you been experiencing swelling? 10 days  2. Where is the swelling located? Started in her breast, and they are swollen then to her ankle, and today it is her face. Still swollen in those places, shown her doctor yesterday but needs to be seen by [place]   3.  Are you currently taking a "fluid pill"? Dyazide  4.  Are you currently SOB? no  5.  Have you traveled recently? No, but states she is under stress. Most ever in her life.  She is working 15 hours a day 7 days a week not tired or nothing out the ordinary for her.

## 2014-07-26 NOTE — Telephone Encounter (Signed)
Spoke w/ pt.  Advised her that Dr. Rockey Situ recommends that we send in lasix 20 mg once daily and eat a banana. Discussed her diet w/ her and she admits that she drinks quite a bit of water throughout the day.  She would like to go ahead and have the lasix sent in and try that until her appt on Wed.

## 2014-07-29 ENCOUNTER — Other Ambulatory Visit: Payer: Self-pay | Admitting: Endocrinology

## 2014-07-29 DIAGNOSIS — E669 Obesity, unspecified: Secondary | ICD-10-CM

## 2014-07-31 ENCOUNTER — Ambulatory Visit (INDEPENDENT_AMBULATORY_CARE_PROVIDER_SITE_OTHER): Payer: Medicare Other | Admitting: Cardiovascular Disease

## 2014-07-31 ENCOUNTER — Encounter: Payer: Self-pay | Admitting: Cardiovascular Disease

## 2014-07-31 VITALS — BP 128/72 | HR 62 | Ht 65.0 in | Wt 218.4 lb

## 2014-07-31 DIAGNOSIS — R0602 Shortness of breath: Secondary | ICD-10-CM | POA: Diagnosis not present

## 2014-07-31 DIAGNOSIS — E669 Obesity, unspecified: Secondary | ICD-10-CM | POA: Diagnosis not present

## 2014-07-31 DIAGNOSIS — I471 Supraventricular tachycardia, unspecified: Secondary | ICD-10-CM

## 2014-07-31 DIAGNOSIS — I5031 Acute diastolic (congestive) heart failure: Secondary | ICD-10-CM

## 2014-07-31 DIAGNOSIS — E785 Hyperlipidemia, unspecified: Secondary | ICD-10-CM | POA: Diagnosis not present

## 2014-07-31 MED ORDER — POTASSIUM CHLORIDE ER 10 MEQ PO TBCR: 10.0000 meq | EXTENDED_RELEASE_TABLET | Freq: Every day | ORAL | Status: DC

## 2014-07-31 NOTE — Assessment & Plan Note (Signed)
Encouraged her to stay on her simvastatin 

## 2014-07-31 NOTE — Patient Instructions (Addendum)
You are doing well. No medication changes were made.  Please continue on the lasix with potassium Lab work next week  Once weight has improved, take lasix as needed with potassium  Please call us if you have new issues that need to be addressed before your next appt.  Your physician wants you to follow-up in: 6 months.  You will receive a reminder letter in the mail two months in advance. If you don't receive a letter, please call our office to schedule the follow-up appointment.

## 2014-07-31 NOTE — Assessment & Plan Note (Addendum)
We have recommended that she cut down on her fluid intake. Continue on Lasix until she achieves her baseline weight. Possibly 5 more pounds to go . So far doing well on Lasix 20 mg daily. We will check basic metabolic panel in 1-2 weeks time when she has achieved her baseline weight. Once she achieves her baseline weight, she will take Lasix only as needed
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
## 2014-07-31 NOTE — Assessment & Plan Note (Signed)
We have encouraged continued exercise, careful diet management in an effort to lose weight. 

## 2014-07-31 NOTE — Assessment & Plan Note (Signed)
No recent arrhythmia on her current medication regimen.  No medication changes made at this time

## 2014-08-01 ENCOUNTER — Other Ambulatory Visit: Payer: Self-pay | Admitting: *Deleted

## 2014-08-01 MED ORDER — ALPRAZOLAM 0.5 MG PO TABS: 0.5000 mg | ORAL_TABLET | Freq: Every evening | ORAL | Status: DC | PRN

## 2014-08-01 NOTE — Telephone Encounter (Signed)
Ok to refill,  printed rx  

## 2014-08-02 NOTE — Telephone Encounter (Signed)
Refill faxed

## 2014-08-06 ENCOUNTER — Ambulatory Visit: Payer: Self-pay | Admitting: Gastroenterology

## 2014-08-06 DIAGNOSIS — I10 Essential (primary) hypertension: Secondary | ICD-10-CM | POA: Diagnosis not present

## 2014-08-06 DIAGNOSIS — D649 Anemia, unspecified: Secondary | ICD-10-CM | POA: Diagnosis not present

## 2014-08-06 DIAGNOSIS — K648 Other hemorrhoids: Secondary | ICD-10-CM | POA: Diagnosis not present

## 2014-08-06 DIAGNOSIS — Z1211 Encounter for screening for malignant neoplasm of colon: Secondary | ICD-10-CM | POA: Diagnosis not present

## 2014-08-06 DIAGNOSIS — Z800 Family history of malignant neoplasm of digestive organs: Secondary | ICD-10-CM | POA: Diagnosis not present

## 2014-08-06 DIAGNOSIS — E669 Obesity, unspecified: Secondary | ICD-10-CM | POA: Diagnosis not present

## 2014-08-06 DIAGNOSIS — Z888 Allergy status to other drugs, medicaments and biological substances status: Secondary | ICD-10-CM | POA: Diagnosis not present

## 2014-08-06 DIAGNOSIS — K219 Gastro-esophageal reflux disease without esophagitis: Secondary | ICD-10-CM | POA: Diagnosis not present

## 2014-08-06 HISTORY — PX: COLONOSCOPY: SHX174

## 2014-08-06 LAB — HM COLONOSCOPY

## 2014-08-23 ENCOUNTER — Other Ambulatory Visit: Payer: Self-pay | Admitting: *Deleted

## 2014-08-23 DIAGNOSIS — E236 Other disorders of pituitary gland: Secondary | ICD-10-CM | POA: Diagnosis not present

## 2014-08-23 DIAGNOSIS — E669 Obesity, unspecified: Secondary | ICD-10-CM

## 2014-08-24 LAB — CREATININE, URINE, 24 HOUR
CREATININE 24H UR: 1634 mg/d (ref 700–1800)
CREATININE, URINE: 77.8 mg/dL

## 2014-08-28 LAB — CORTISOL, URINE, 24 HOUR
Cortisol (Ur), Free: 12.9 mcg/24 h (ref 4.0–50.0)
RESULTS RECEIVED: 1.66 g/(24.h) (ref 0.63–2.50)

## 2014-08-30 ENCOUNTER — Other Ambulatory Visit: Payer: Self-pay | Admitting: Internal Medicine

## 2014-09-02 NOTE — Telephone Encounter (Signed)
Scripts sent electronically.

## 2014-09-06 ENCOUNTER — Encounter: Payer: Self-pay | Admitting: *Deleted

## 2014-09-10 ENCOUNTER — Other Ambulatory Visit: Payer: Self-pay | Admitting: Endocrinology

## 2014-09-10 DIAGNOSIS — E236 Other disorders of pituitary gland: Secondary | ICD-10-CM

## 2014-09-16 ENCOUNTER — Telehealth: Payer: Self-pay

## 2014-09-16 NOTE — Telephone Encounter (Signed)
Patient called to verify how to store her saliva cortisol samples. Per Dr. Howell Rucks samples can be stored at room temperature for 2-4wks. Patient may also store sample in the freezer. Patient verbalized understanding.

## 2014-09-26 ENCOUNTER — Other Ambulatory Visit: Payer: Self-pay | Admitting: *Deleted

## 2014-09-26 DIAGNOSIS — E236 Other disorders of pituitary gland: Secondary | ICD-10-CM

## 2014-09-27 NOTE — Discharge Summary (Signed)
PATIENT NAME:  Tracey Wiley, Tracey Wiley MR#:  [id number] DATE OF BIRTH:  1956-04-02  DATE OF ADMISSION:  08/08/2012 DATE OF DISCHARGE:    PRIMARY CARE PHYSICIAN: Dr. Derrel Nip.   GI PHYSICIAN: Dr. Dionne Milo.   CARDIOLOGIST: Dr. Rockey Situ.   CHIEF COMPLAINT: Chest pain.   DISCHARGE DIAGNOSES: 1.  Chest pain, likely esophageal-related and gastroesophageal reflux disease-related. 2.  Hypertension.  3.  Hyperlipidemia.  4.  History of patent foramen ovale, status post repair.  5.  Paroxysmal atrial fibrillation.  6.  History of deep venous thrombosis.   DISCHARGE MEDICATIONS:  Xanax extended-release 0.5 mg 1 tab at bedtime, Zocor 40 mg daily, sotalol 80 mg 2 times a day, hydrochlorothiazide/triamterene 25/37.5 mg 1 tab once a day, Prilosec extended-release 20 mg 1 tab 2 times a day.   DIET: Low sodium, low fat, low cholesterol.   ACTIVITY: As tolerated.   FOLLOWUP: Please follow with your PCP and Dr. Dionne Milo within 1 to 2 weeks. Please repeat x-ray of the chest in 4 to 6 weeks.   DISPOSITION: Home.   CODE STATUS: The patient is a full code.   HISTORY OF PRESENT ILLNESS AND HOSPITAL COURSE: For full details of H and P, please see the dictation on March 4 by Dr. Waldron Labs but, briefly, this is a pleasant 59 year old female with history of hyperlipidemia, hypertension, GERD, status post PFO repair, history of DVT who presents with pleuritic chest pain which was nonradiating, sharp and was midsternal. Symptoms were pleuritic, worse with movement. She had negative troponins x 2 and had normal d-dimer. She was evaluated for DVT with ultrasound and PE with a CT scan PE protocol, both of which were negative. SHE REPORTS ALLERGY TO IV CONTRAST and was premedicated, was also started on some IV fluids and underwent the CAT scan, PE protocol. She was given Solu-Medrol and Benadryl to rule out a PE given the pleuritic nature of the chest pain. She had x-ray of the chest which was negative. She ruled out for acute  coronary syndrome with cyclic cardiac markers x 4. On further discussions with the patient, the patient does have a history of esophageal issues and has dilation with Dr. Dionne Milo almost every 2 years, however, at  the last visit that was delayed so the last dilation was about 4 years ago. The patient complains of GERD-like symptoms and previously was on AcipHex but now on omeprazole 20 mg daily. She stated that 2 hours before the initiation of the pain she had 2 vitamins which kind of got stuck in her throat and she had difficulty swallowing them and the pain started 2 hours later. The patient also reports some nausea. It is possible that symptoms are esophageal in nature and all the symptoms resolved last night. I discussed the possibility of an esophageal spasm and she did have an appointment with Dr. Dionne Milo today. Will try to make an appointment with Dr. Dionne Milo in the next couple of weeks. She has a negative d-dimer as well as a normal CBC profile, normal creatinine, BUN and electrolytes otherwise. The CAT scan of the chest with PE protocol did show hazy diffuse ground-glass density in the lung, mostly in the bases. Of note, she has had a recent pneumonia and influenza infection which was treated as an outpatient a couple of months ago and I recommended followup x-ray of the chest with Dr. Derrel Nip. At this point, as her symptoms are gone, she is not hypoxic, she has no fever, no leukocytosis, no productive cough, I would discharge  her with outpatient followup with Dr. Derrel Nip and Dr. Dionne Milo.     ____________________________ Vivien Presto, MD sa:cs D: 08/08/2012 15:19:00 ET T: 08/08/2012 15:33:34 ET JOB#: [id number]  cc: Deborra Medina, MD Jill Side, MD Vivien Presto, MD, <Dictator>  Vivien Presto MD ELECTRONICALLY SIGNED 08/14/2012 13:24

## 2014-09-27 NOTE — H&P (Signed)
PATIENT NAME:  Tracey Wiley, Tracey Wiley MR#:  [id number] DATE OF BIRTH:  [birth year]  DATE OF ADMISSION:  08/08/2012  REFERRING PHYSICIAN:  Dr. Marsa Aris.    CARDIOLOGIST:  Dr. Ida Rogue.   PRIMARY CARE PHYSICIAN: Dr. Deborra Medina.  CHIEF COMPLAINT: Chest pain.   HISTORY OF PRESENT ILLNESS: This is a 59 year old female with significant past medical history of hypertension, hyperlipidemia, PFO status post repair, history of DVT in the past, off warfarin, who presents today with chest pain. The patient reports her chest pain  is mid sternal, sharp in quality, non-radiating that started after she started taking some vitamins, as well she reports some shortness of breath. Reports her symptoms are only worsened by deep inspiration and movement. In the ED the patient had negative troponins x 2, and as well, she had negative D-dimer and given her previous history of DVT, there was a concern of PE but the patient was never hypoxic, never tachycardic, has bilateral lower extremity venous Doppler, which was negative. As well, her D-dimers were within normal limits. She does report allergy to IV contrast, so she is currently being premedicated prior to receiving her CT of the chest with IV contrast. The patient reports she had a stress test in the remote past in [place], but nothing since then. She reports she has been followed regular with Dr. Rockey Situ.  The patient has a chronic problem with ears, including dizziness and vertigo, which she reports and she did have some after receiving her Benadryl and Solu-Medrol dose in the ER as part of premedication for her contrast.  She denies any cough, any productive sputum, any coffee-ground emesis, any hematochezia, diarrhea, constipation, fever or chills.   PAST MEDICAL HISTORY: 1.  Hypertension. 2.  Hyperlipidemia.  3.  History of PFO status post repair.  4.  Paroxysmal atrial fibrillation.  5.  History of DVT in 2008, was on anticoagulation for 1 year until  2009.  PAST SURGICAL HISTORY: 1.  Multiple surgeries on the left ear.  2.  Multiple surgeries in the nose. 3.  PFO repair.   FAMILY HISTORY: Significant for heart attack for her father in his 7s. Sister has colon cancer, mother had CVA and dementia.  ALLERGIES:   1.  DIFLUCAN. 2.  ERYTHROMYCIN. 3.  BIAXIN. 4.  CIPROFLOXACIN. 5.  CODEINE. 6.  CT DYE. 7.  LAMICTAL. 8.  MACROBID. 9.  STADOL. 10.  TALWIN.   HOME MEDICATIONS:  1.  Sotalol 80 mg 2 times a day.  2.  Zocor 40 mg oral daily.  3.  Hydrochlorothiazide/triamterene 25/37.5 oral daily.  4.  Xanax XR 0.5 tablets extended release at bedtime.  5.  Prilosec 20 mg oral daily.   SOCIAL HISTORY: The patient lives with family, nonsmoker, nondrinker does not use any drugs.   REVIEW OF SYSTEMS:  CONSTITUTIONAL:  Denies fever, chills, fatigue or weakness.  EYES: Denies blurry vision, double vision, pain, inflammation or glaucoma.  ENT: Denies tinnitus, ear pain, pain, hearing loss, epistaxis or discharge.  RESPIRATORY: Denies cough, wheezing, hemoptysis, COPD, reports shortness of breath.  CARDIOVASCULAR: Has pleuritic chest pain. Denies any edema, arrhythmia, palpitations, syncope.  GASTROINTESTINAL: Denies nausea, vomiting, diarrhea, abdominal pain, hematemesis, melena, rectal bleed, hemorrhoids or jaundice.   GENITOURINARY: Denies dysuria, hematuria, renal colic.  ENDOCRINE:  Denies polyuria, polydipsia, heat or cold intolerance.  HEMATOLOGIC/LYMPHATIC:  Denies anemia, easy bruising, bleeding diathesis.  INTEGUMENT: Denies acne, rash or skin lesions.  MUSCULOSKELETAL: Denies any gout, arthritis, cramps, limited activity. NEUROLOGIC:  Denies  ataxia, dysarthria, weakness, numbness, focal deficits.  PSYCHIATRIC: Has anxiety. Denies any schizophrenia, substance abuse, alcohol abuse, bipolar disorder.   PHYSICAL EXAMINATION: VITAL SIGNS: Temperature 96.9, pulse 58, respiratory rate 18, blood pressure 162/89, saturating 97% on  room air.  GENERAL: Well-nourished female who looks comfortable and in no apparent distress.  HEENT: Normocephalic, atraumatic. Pupils equal, reactive to light. Pink conjunctivae. Anicteric sclerae. Moist oral mucosa.  NECK: Supple. No thyromegaly. No JVD.  CHEST: Good air entry bilaterally. No wheezing, rales, rhonchi.  CARDIOVASCULAR: S1, S2 heard. No rubs, murmurs or gallops.  ABDOMEN: Soft, nontender, nondistended. Bowel sounds present.  EXTREMITIES: No edema. No clubbing. No cyanosis.  PSYCHIATRIC: Appropriate affect. Awake, alert x 3. Intact judgment and insight.  NEUROLOGIC: Grossly intact. Motor 5/5. Sensation symmetrical and intact.  SKIN: Normal skin turgor. Warm and dry.   LABORATORY AND DIAGNOSTIC DATA:  Glucose 116, BUN 14, creatinine 0.82, sodium 140, potassium 4.2, chloride 105 and CO2 30. Troponin less than 0.02 x 2. White blood cell 8.7, hemoglobin 13.7, hematocrit 41, platelets 289, D-dimer 0.22. Venous Doppler showing no DVT in the left lower extremity.   ASSESSMENT AND PLAN: 1.  Chest pain, appears to be atypical, pleuritic quality, reproducible by movement and deep breath, unlikely cardiac, but due to her family history, we will continue to cycle her troponins as well and as well, we will consult cardiology, Dr. Rockey Situ, as he is very familiar with the patient to see if there is any further work-up or stress test needed for the patient. As well the patient has negative D-dimers.  Her venous Doppler is negative for deep vein thrombosis negative by her pain is Doppler is negative for deep vein thrombosis in the left lower extremity. The patient is currently being premedicated to receive CT of the chest with contrast to rule out pulmonary embolus.  We will give 324 mg of aspirin.  2.  Hyperlipidemia. Continue with statin.  3.  Hypertension. Blood pressure is a mild elevated. Will resume her on her home medication.  4. History of atrial fibrillation. Currently the patient is normal  sinus rhythm. Continue with sotalol and given aspirin. Cardiology consulted.  5.  Deep vein thrombosis prophylaxis with subcutaneous heparin.  Gastrointestinal prophylaxis on proton pump inhibitor.  CODE STATUS: Full code.   Total time spent ON admission and patient care: 50 minutes.    ____________________________ Albertine Patricia, MD dse:ct D: 08/08/2012 01:28:28 ET T: 08/08/2012 08:18:28 ET JOB#: [id number]  cc: Albertine Patricia, MD, <Dictator> Eula Listen, MD Deborra Medina, MD  Janssen Zee Graciela Husbands MD ELECTRONICALLY SIGNED 08/09/2012 0:39

## 2014-09-28 NOTE — Op Note (Signed)
PATIENT NAME:  Tracey Wiley, Tracey Wiley MR#:  [id number] DATE OF BIRTH:  22-Apr-1956  DATE OF PROCEDURE:  11/12/2013  PREOPERATIVE DIAGNOSES: 1. Lynch syndrome.  2. Adenomyosis.  3. Status post endometrial ablation.  4. Thickened endometrium on ultrasound.  5. Status post endometrial biopsy, with inconclusive findings.   POSTOPERATIVE DIAGNOSES:  1. Lynch syndrome.  2. Adenomyosis.  3. Status post endometrial ablation.  4. Thickened endometrium on ultrasound.  5. Status post endometrial biopsy, with inconclusive findings.   OPERATIVE PROCEDURE: Hysteroscopy with dilation and curettage.   SURGEON: Dr. Enzo Bi, MD  FIRST ASSISTANT: None.   ANESTHESIA: General.   INDICATIONS: The patient is a 59 year old white female, para 0-0-1-0, menopausal, on no hormone replacement therapy with history of Lynch syndrome, adenomyosis, status post ablation, who presents for endometrial sampling. Ultrasound demonstrated thickened endometrium and previous endometrial biopsy was inconclusive with no endometrial tissue being obtained.   FINDINGS AT SURGERY: Included a gynecoid pelvis; there is good uterine descensus to within 3 cm of the introitus. The patient is a good TVH candidate if indicated. The uterus sounded to 7 cm, and the endometrial cavity was scarred and irregular with suspected fibroids. Shaggy endometrium was noted.   DESCRIPTION OF PROCEDURE: The patient was brought to the Operating Room where she was placed in the supine position. General anesthesia was induced without difficulty. She was placed in the dorsal lithotomy position using the candycane stirrups. A Betadine perineal, intravaginal prep and drape was performed in standard fashion. A red Robinson catheter was used to drain 100 mL of urine from the bladder. A weighted speculum was placed into the vagina and a single-tooth tenaculum on the anterior lip of the cervix. The uterus sounded to 7 cm. The cavity was irregular. Hanks dilators were  used up to a #16 [place] caliber in order to dilate the endocervical canal. Endometrial curettage was performed with both smooth and serrated curettes. Hysteroscopy demonstrated a scarred irregular endometrial cavity without obvious neoplastic lesions. Repeat curettage was performed and stone polyp forceps were used to remove minimal tissue that was identified. Once satisfied with sampling, the procedure was terminated, all instrumentation being removed from the vagina. The patient was then awakened, mobilized, and taken to the recovery room in satisfactory condition.   ESTIMATED BLOOD LOSS: Less than 25 mL.   IV FLUIDS: Not quantified.   URINE OUTPUT: 100 mL.   All instruments, needle, and sponge counts were verified as correct.   ____________________________ Alanda Slim. DeFrancesco, MD mad:sg D: 11/12/2013 08:58:27 ET T: 11/12/2013 09:26:59 ET JOB#: [id number]  cc: Hassell Done A. DeFrancesco, MD, <Dictator> [facility]  Alanda Slim DEFRANCESCO MD ELECTRONICALLY SIGNED 12/05/2013 17:08

## 2014-10-04 ENCOUNTER — Other Ambulatory Visit: Payer: Self-pay | Admitting: Cardiovascular Disease

## 2014-10-05 LAB — SALIVARY CORTISOL X4, TIMED
SALIVARY CORTISOL 3RD SPECIMEN: 0.042 ug/dL
SALIVARY CORTISOL BASELINE: 0.023 ug/dL
Salivary Cortisol 2nd Specimen: 0.049 ug/dL
Salivary Cortisol 4th Specimen: 0.041 ug/dL

## 2014-10-08 ENCOUNTER — Encounter: Payer: Self-pay | Admitting: *Deleted

## 2014-10-24 ENCOUNTER — Encounter: Payer: Self-pay | Admitting: Endocrinology

## 2014-10-24 ENCOUNTER — Ambulatory Visit (INDEPENDENT_AMBULATORY_CARE_PROVIDER_SITE_OTHER): Payer: Medicare Other | Admitting: Endocrinology

## 2014-10-24 VITALS — BP 110/62 | HR 62 | Resp 14 | Ht 65.0 in | Wt 217.5 lb

## 2014-10-24 DIAGNOSIS — E669 Obesity, unspecified: Secondary | ICD-10-CM | POA: Diagnosis not present

## 2014-10-24 DIAGNOSIS — E041 Nontoxic single thyroid nodule: Secondary | ICD-10-CM

## 2014-10-24 DIAGNOSIS — E236 Other disorders of pituitary gland: Secondary | ICD-10-CM | POA: Diagnosis not present

## 2014-10-24 NOTE — Assessment & Plan Note (Signed)
We discussed that the last imaging done in 2012 doesn't have any detectable abnormality in the area of the pituitary. Based on symptoms, there is no strong suspicion for pituitary dysfunction. Recent screening for Cushing's x neg by 2 tests. If contd weight gain, then will consider rescreening with 24 hour urine for UFC again.     Continue to monitor symptoms for now. If evidence of any pertinent lab abnormality -then will consider MRI pituitary for follow up.
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
## 2014-10-24 NOTE — Progress Notes (Signed)
Pre visit review using our clinic review tool, if applicable. No additional management support is needed unless otherwise documented below in the visit note. 

## 2014-10-24 NOTE — Patient Instructions (Signed)
Please come back for a follow-up appointment in 6 months Return to [name] for control of headaches

## 2014-10-24 NOTE — Assessment & Plan Note (Signed)
Last Thyroid US done 05/2014. She has small nodules with overall stable left nodule that doesn't meet the criteria for FNA. Continue to monitor and repeat US in 1-2 years.   She doesn't have compressive symptoms and will continue to monitor for now.  Recent thyroid levels normal

## 2014-10-24 NOTE — Assessment & Plan Note (Signed)
She has had rapid weight gain over the past year that is mainly central in nature. Clinically doesn't appear as Cushingoid. Recent steroid use Nov 2015. Screen for subclinical Cushing's with 24 hour urine creatinine and cortisol and MN salivary cortisol was normal. 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  She was also asked to follow back with [name] for evaluation of headaches, which could be secondary as well to her stress levels.

## 2014-10-25 ENCOUNTER — Telehealth: Payer: Self-pay | Admitting: Internal Medicine

## 2014-10-25 MED ORDER — CELECOXIB 200 MG PO CAPS: 200.0000 mg | ORAL_CAPSULE | Freq: Two times a day (BID) | ORAL | Status: DC

## 2014-10-25 MED ORDER — PREDNISONE 10 MG PO TABS: ORAL_TABLET | ORAL | Status: DC

## 2014-10-25 NOTE — Telephone Encounter (Signed)
Patient notified and voiced understanding.

## 2014-10-25 NOTE — Telephone Encounter (Signed)
Back Flare needing to know what to do next .

## 2014-10-25 NOTE — Telephone Encounter (Signed)
Patient stated you have been treating her for this for about 6 years when she overworks her back that her back goes into spasms. Patient stated last time you treated her with prednisone and the Celebrex  Patient needs refill on Celebrex. IN the past you advised her to use the Celebrex for 3 days and if this doesn't you would call in the prednisone. Patient scheduled for Monday at 4.

## 2014-10-25 NOTE — Telephone Encounter (Signed)
Same plan.  Take te celebrex twice daily,  If no imporvmvent ,  Stop celebrex and use the prednisone taper .  Both refilled

## 2014-10-28 ENCOUNTER — Ambulatory Visit (INDEPENDENT_AMBULATORY_CARE_PROVIDER_SITE_OTHER): Payer: Medicare Other | Admitting: Internal Medicine

## 2014-10-28 ENCOUNTER — Ambulatory Visit: Payer: Medicare Other | Admitting: Internal Medicine

## 2014-10-28 ENCOUNTER — Encounter: Payer: Self-pay | Admitting: Internal Medicine

## 2014-10-28 VITALS — BP 122/78 | HR 65 | Temp 98.3°F | Resp 14 | Ht 65.0 in | Wt 217.2 lb

## 2014-10-28 DIAGNOSIS — G510 Bell's palsy: Secondary | ICD-10-CM | POA: Diagnosis not present

## 2014-10-28 DIAGNOSIS — F411 Generalized anxiety disorder: Secondary | ICD-10-CM | POA: Diagnosis not present

## 2014-10-28 DIAGNOSIS — M542 Cervicalgia: Secondary | ICD-10-CM | POA: Diagnosis not present

## 2014-10-28 MED ORDER — PREDNISONE 20 MG PO TABS: 60.0000 mg | ORAL_TABLET | Freq: Every day | ORAL | Status: DC

## 2014-10-28 NOTE — Patient Instructions (Signed)
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  Protect your right eye from drying out with artificial tears  If you develop numbness or weakness of the left arm or leg,  Go to the ER immediately  Bell's Palsy Bell's palsy is a condition in which the muscles on one side of the face cannot move (paralysis). This is because the nerves in the face are paralyzed. It is most often thought to be caused by a virus. The virus causes swelling of the nerve that controls movement on one side of the face. The nerve travels through a tight space surrounded by bone. When the nerve swells, it can be compressed by the bone. This results in damage to the protective covering around the nerve. This damage interferes with how the nerve communicates with the muscles of the face. As a result, it can cause weakness or paralysis of the facial muscles.  Injury (trauma), tumor, and surgery may cause Bell's palsy, but most of the time the cause is unknown. It is a relatively common condition. It starts suddenly (abrupt onset) with the paralysis usually ending within 2 days. Bell's palsy is not dangerous. But because the eye does not close properly, you may need care to keep the eye from getting dry. This can include splinting (to keep the eye shut) or moistening with artificial tears. Bell's palsy very seldom occurs on both sides of the face at the same time. SYMPTOMS   Eyebrow sagging.  Drooping of the eyelid and corner of the mouth.  Inability to close one eye.  Loss of taste on the front of the tongue.  Sensitivity to loud noises. TREATMENT  The treatment is usually non-surgical. If the patient is seen within the first 24 to 48 hours, a short course of steroids may be prescribed, in an attempt to shorten the length of the condition. Antiviral medicines may also be used with the steroids,  but it is unclear if they are helpful.  You will need to protect your eye, if you cannot close it. The cornea (clear covering over your eye) will become dry and can be damaged. Artificial tears can be used to keep your eye moist. Glasses or an eye patch should be worn to protect your eye. PROGNOSIS  Recovery is variable, ranging from days to months. Although the problem usually goes away completely (about 80% of cases resolve), predicting the outcome is impossible. Most people improve within 3 weeks of when the symptoms began. Improvement may continue for 3 to 6 months. A small number of people have moderate to severe weakness that is permanent.  HOME CARE INSTRUCTIONS   If your caregiver prescribed medication to reduce swelling in the nerve, use as directed. Do not stop taking the medication unless directed by your caregiver.  Use moisturizing eye drops as needed to prevent drying of your eye, as directed by your caregiver.  Protect your eye, as directed by your caregiver.  Use facial massage and exercises, as directed by your caregiver.  Perform your normal activities, and get your normal rest. SEEK IMMEDIATE MEDICAL CARE IF:   There is pain, redness or irritation in the eye.  You or your child has an oral temperature above 102 F (38.9 C), not controlled by medicine. MAKE SURE YOU:   Understand these instructions.  Will watch your condition.  Will get help right away if you are not doing well or get worse. Document Released: 05/24/2005 Document Revised: 08/16/2011 Document Reviewed: 08/31/2013 [facility] Patient Information 2015 [place], [place]. This information is not intended to replace advice given to you by your health care provider. Make sure you discuss any questions you have with your health care provider.

## 2014-10-28 NOTE — Progress Notes (Signed)
Subjective:  Patient ID: Tracey Wiley, female    DOB: August 07, 1955  Age: 59 y.o. MRN: [id number]  CC: The primary encounter diagnosis was Facial paralysis/Bells palsy. Diagnoses of Generalized anxiety disorder and Cervicalgia were also pertinent to this visit.  HPI Tracey Wiley presents for sudden onset of facial paralysis started 8 pm last evening, noticed it with attempts to spit and drink .  Numbness right side of face.  rght eye keeps blinking on its own , right side of tongue feels llit it is waking up from novocaine.  Symptoms are getting worse.  Cannot drink from the bottle,  No ear pain or shingles blisters.     Has been emotionally stressed out caring for estranged husband who has become unable to care for himself for unclear reasons.  dementia,  Incontinence,   Outpatient Prescriptions Prior to Visit  Medication Sig Dispense Refill  . acetaminophen (TYLENOL) 500 MG tablet Take 500 mg by mouth daily as needed for pain.    Marland Kitchen albuterol (PROVENTIL HFA;VENTOLIN HFA) 108 (90 BASE) MCG/ACT inhaler Inhale 2 puffs into the lungs every 6 (six) hours as needed for wheezing or shortness of breath. 1 Inhaler 2  . ALPRAZolam (XANAX) 0.5 MG tablet Take 1 tablet (0.5 mg total) by mouth at bedtime as needed for anxiety. 31 tablet 5  . aspirin EC 81 MG tablet Take 81 mg by mouth daily.    Marland Kitchen CALCIUM PO Take 1 tablet by mouth daily.    . chlorhexidine (PERIDEX) 0.12 % solution GARGLE AND SPIT 15 MLS IN THE MOUTH OR THROAT 2 (TWO) TIMES DAILY. 473 mL 0  . Cholecalciferol (VITAMIN D PO) Take 1 tablet by mouth daily.    . colchicine 0.6 MG tablet Take 1 tablet (0.6 mg total) by mouth daily. (Patient taking differently: Take 0.6 mg by mouth as needed. ) 30 tablet 2  . esomeprazole (NEXIUM) 40 MG capsule Take 40 mg by mouth 2 (two) times daily before a meal.     . Flaxseed, Linseed, (FLAX SEEDS PO) Take 1 tablet by mouth daily.    . furosemide (LASIX) 20 MG tablet Take 1 tablet (20 mg total) by mouth  daily. (Patient taking differently: Take 20 mg by mouth daily as needed. ) 90 tablet 3  . hyoscyamine (LEVSIN SL) 0.125 MG SL tablet Place 1 tablet (0.125 mg total) under the tongue every 4 (four) hours as needed for cramping. 60 tablet 3  . ibuprofen (ADVIL,MOTRIN) 200 MG tablet Take 200 mg by mouth every 6 (six) hours as needed.    . lidocaine (LIDODERM) 5 % Place 1 patch onto the skin daily. Remove & Discard patch within 12 hours or as directed by MD    . LYSINE PO Take 1 tablet by mouth daily.    Marland Kitchen MAGNESIUM PO Take 1 tablet by mouth daily.    Marland Kitchen MILK THISTLE EXTRACT PO Take 1 tablet by mouth daily.    . Omega-3 Fatty Acids (FISH OIL PO) Take 1 tablet by mouth daily.    . potassium chloride (K-DUR) 10 MEQ tablet Take 1 tablet (10 mEq total) by mouth daily. (Patient taking differently: Take 10 mEq by mouth daily as needed. ) 30 tablet 6  . predniSONE (DELTASONE) 10 MG tablet 6 tablets on Day 1 , then reduce by 1 tablet daily until gone 21 tablet 0  . sotalol (BETAPACE) 80 MG tablet Take 80 mg by mouth 2 (two) times daily.    Marland Kitchen triamterene-hydrochlorothiazide (DYAZIDE)  37.5-25 MG per capsule Take 1 capsule by mouth daily.    Marland Kitchen VALTREX 1 G tablet TAKE 2 TABLETS BY MOUTH 3 TIMES A DAY AS NEEDED 15 tablet 2  . ZOCOR 40 MG tablet TAKE 1 TABLET (40 MG TOTAL) BY MOUTH DAILY. 30 tablet 6  . celecoxib (CELEBREX) 200 MG capsule Take 1 capsule (200 mg total) by mouth 2 (two) times daily. 60 capsule 1   No facility-administered medications prior to visit.    Review of Systems;  Patient denies headache, fevers, malaise, unintentional weight loss, skin rash, eye pain, sinus congestion and sinus pain, sore throat, dysphagia,  hemoptysis , cough, dyspnea, wheezing, chest pain, palpitations, orthopnea, edema, abdominal pain, nausea, melena, diarrhea, constipation, flank pain, dysuria, hematuria, urinary  Frequency, nocturia, numbness, tingling, seizures,  Focal weakness, Loss of consciousness,  Tremor,  insomnia, depression, anxiety, and suicidal ideation.      Objective:  BP 122/78 mmHg  Pulse 65  Temp(Src) 98.3 F (36.8 C) (Oral)  Resp 14  Ht 5\' 5"  (1.651 m)  Wt 217 lb 4 oz (98.544 kg)  BMI 36.15 kg/m2  SpO2 99%  BP Readings from Last 3 Encounters:  10/28/14 122/78  10/24/14 110/62  07/31/14 128/72    Wt Readings from Last 3 Encounters:  10/28/14 217 lb 4 oz (98.544 kg)  10/24/14 217 lb 8 oz (98.657 kg)  07/31/14 218 lb 6.4 oz (99.066 kg)    General appearance: alert, cooperative and appears stated age Ears: normal TM's and external ear canals both ears Throat: lips, mucosa, and tongue normal; teeth and gums normal Neck: no adenopathy, no carotid bruit, supple, symmetrical, trachea midline and thyroid not enlarged, symmetric, no tenderness/mass/nodules Back: symmetric, no curvature. ROM normal. No CVA tenderness. Lungs: clear to auscultation bilaterally Heart: regular rate and rhythm, S1, S2 normal, no murmur, click, rub or gallop Abdomen: soft, non-tender; bowel sounds normal; no masses,  no organomegaly Pulses: 2+ and symmetric Skin: Skin color, texture, turgor normal. No rashes or lesions MSK: mild asymmetry of face due to weakness of right sided facial nerve Lymph nodes: Cervical, supraclavicular, and axillary nodes normal.  Lab Results  Component Value Date   HGBA1C 6.3 07/25/2014   HGBA1C 6.1 12/19/2012    Lab Results  Component Value Date   CREATININE 0.9 05/10/2014   CREATININE 0.8 01/24/2014   CREATININE 0.90 01/05/2014    Lab Results  Component Value Date   WBC 8.5 01/05/2014   HGB 13.6 01/05/2014   HCT 40.0 01/05/2014   PLT 267 01/05/2014   GLUCOSE 112* 05/10/2014   CHOL 184 01/24/2014   TRIG 140.0 01/24/2014   HDL 47.10 01/24/2014   LDLCALC 109* 01/24/2014   ALT 18 01/24/2014   AST 24 01/24/2014   NA 140 05/10/2014   K 4.1 05/10/2014   CL 102 05/10/2014   CREATININE 0.9 05/10/2014   BUN 15 05/10/2014   CO2 29 05/10/2014   TSH 2.07  07/25/2014   HGBA1C 6.3 07/25/2014    No results found.  Assessment & Plan:   Problem List Items Addressed This Visit    Cervicalgia    Recent pain escalation with headaches brought on by physical exertion, now resolving.       Generalized anxiety disorder    Aggravated by the continued responsibility of caring her her estranged husband Tracey Wiley..  Long discussion today about setting boundaries,  Moving on with her life and not feeling guilty about his condition which appears to be somatic and complex.  Marland KitchenMarland Kitchen  No medication changes,        Facial paralysis/Bells palsy - Primary    Mild, involving mouth, nose and eye on right side.  No extremity numbness, no headaches,  No vesicles to suggest zoster,  Prednisone taper given.          I have discontinued Ms. Shiley celecoxib. I am also having her start on predniSONE. Additionally, I am having her maintain her acetaminophen, aspirin EC, esomeprazole, triamterene-hydrochlorothiazide, sotalol, CALCIUM PO, Cholecalciferol (VITAMIN D PO), LYSINE PO, Omega-3 Fatty Acids (FISH OIL PO), (Flaxseed, Linseed, (FLAX SEEDS PO)), MILK THISTLE EXTRACT PO, MAGNESIUM PO, albuterol, colchicine, hyoscyamine, chlorhexidine, furosemide, potassium chloride, ALPRAZolam, VALTREX, ibuprofen, lidocaine, ZOCOR, and predniSONE.  Meds ordered this encounter  Medications  . predniSONE (DELTASONE) 20 MG tablet    Sig: Take 3 tablets (60 mg total) by mouth daily with breakfast. For  7 days    Dispense:  21 tablet    Refill:  0    Medications Discontinued During This Encounter  Medication Reason  . celecoxib (CELEBREX) 200 MG capsule     Follow-up: No Follow-up on file.   Crecencio Mc, MD

## 2014-10-29 DIAGNOSIS — G510 Bell's palsy: Secondary | ICD-10-CM | POA: Insufficient documentation

## 2014-10-29 HISTORY — DX: Bell's palsy: G51.0

## 2014-10-29 NOTE — Assessment & Plan Note (Signed)
Recent pain escalation with headaches brought on by physical exertion, now resolving.

## 2014-10-29 NOTE — Assessment & Plan Note (Signed)
Aggravated by the continued responsibility of caring her her estranged husband Vinnie..  Long discussion today about setting boundaries,  Moving on with her life and not feeling guilty about his condition which appears to be somatic and complex.  .. No medication changes,

## 2014-10-29 NOTE — Assessment & Plan Note (Signed)
Mild, involving mouth, nose and eye on right side.  No extremity numbness, no headaches,  No vesicles to suggest zoster,  Prednisone taper given.

## 2014-11-01 ENCOUNTER — Ambulatory Visit: Payer: Medicare Other | Admitting: Internal Medicine

## 2014-11-01 ENCOUNTER — Telehealth: Payer: Self-pay | Admitting: Internal Medicine

## 2014-11-01 MED ORDER — PREDNISONE 10 MG PO TABS
ORAL_TABLET | ORAL | Status: DC
Start: 2014-11-01 — End: 2014-12-05

## 2014-11-01 NOTE — Telephone Encounter (Signed)
Dr. Derrel Nip,   Patient was given Prednisone on the 5.23 visit.  Please advise?

## 2014-11-01 NOTE — Telephone Encounter (Signed)
I will extend the taper to 60 mg daily for 6 days,  Then decrease tablet by 1 tablet daily until gone.  I no improvement by Tuesday,  Call to arrange MRI

## 2014-11-01 NOTE — Telephone Encounter (Signed)
The pt called as requested about the medication she was placed on during her visit on 10/28/14, She is having a harder time talking, struggling to form words. Tolerating the medication, but feeling the medication is not having an effect as the symptoms have neither improved nor gotten worse. [phone] 11/01/14 maf

## 2014-11-05 ENCOUNTER — Other Ambulatory Visit: Payer: Self-pay

## 2014-11-05 DIAGNOSIS — I5031 Acute diastolic (congestive) heart failure: Secondary | ICD-10-CM

## 2014-11-05 NOTE — Telephone Encounter (Signed)
Message not seen until 5.31.16.  Called patient, patient verbalized understanding of extended plan for prednisone.

## 2014-11-05 NOTE — Telephone Encounter (Signed)
Spoke with patient, concerned that her and her husband have dental appointments coming up and need pre antibiotic medications.  Separate messages sent for refills on medications.  Thanks Please advise

## 2014-11-06 MED ORDER — AMOXICILLIN 500 MG PO CAPS: ORAL_CAPSULE | ORAL | Status: DC

## 2014-11-06 NOTE — Telephone Encounter (Signed)
She has no indication for pre procedure antibiotic prophylaxis since her PFO closure was years ago and there is no persistent defect, but if she is willing to accept the risk of diarrhea I will call it in

## 2014-11-07 ENCOUNTER — Other Ambulatory Visit: Payer: Self-pay | Admitting: *Deleted

## 2014-11-07 MED ORDER — SIMVASTATIN 40 MG PO TABS
ORAL_TABLET | ORAL | Status: DC
Start: 2014-11-07 — End: 2015-06-20

## 2014-11-20 ENCOUNTER — Other Ambulatory Visit: Payer: Self-pay | Admitting: *Deleted

## 2014-11-20 MED ORDER — SOTALOL HCL 80 MG PO TABS: 80.0000 mg | ORAL_TABLET | Freq: Two times a day (BID) | ORAL | Status: DC

## 2014-12-03 ENCOUNTER — Other Ambulatory Visit: Payer: Self-pay

## 2014-12-03 ENCOUNTER — Telehealth: Payer: Self-pay | Admitting: Obstetrics and Gynecology

## 2014-12-03 DIAGNOSIS — Z8041 Family history of malignant neoplasm of ovary: Secondary | ICD-10-CM

## 2014-12-03 DIAGNOSIS — Z800 Family history of malignant neoplasm of digestive organs: Secondary | ICD-10-CM

## 2014-12-03 NOTE — Telephone Encounter (Signed)
U/S ORDER IN

## 2014-12-03 NOTE — Telephone Encounter (Signed)
Patient called regarding an ultrasound she had scheduled for Friday by mistake by amber when she was here. I have her scheduled tomorrow morning for her ultrasound since there is no ultrasound on Thursdays. If you can put in an order for this please and I think I can attach the appt to the order.Thanks

## 2014-12-04 ENCOUNTER — Ambulatory Visit: Payer: Medicare Other

## 2014-12-04 DIAGNOSIS — Z800 Family history of malignant neoplasm of digestive organs: Secondary | ICD-10-CM

## 2014-12-04 DIAGNOSIS — Z8041 Family history of malignant neoplasm of ovary: Secondary | ICD-10-CM | POA: Diagnosis not present

## 2014-12-05 ENCOUNTER — Ambulatory Visit (INDEPENDENT_AMBULATORY_CARE_PROVIDER_SITE_OTHER): Payer: Medicare Other | Admitting: Obstetrics and Gynecology

## 2014-12-05 ENCOUNTER — Encounter: Payer: Self-pay | Admitting: Obstetrics and Gynecology

## 2014-12-05 ENCOUNTER — Other Ambulatory Visit: Payer: Self-pay | Admitting: Internal Medicine

## 2014-12-05 VITALS — BP 129/85 | HR 64 | Ht 65.0 in | Wt 220.9 lb

## 2014-12-05 DIAGNOSIS — Z800 Family history of malignant neoplasm of digestive organs: Secondary | ICD-10-CM | POA: Diagnosis not present

## 2014-12-05 DIAGNOSIS — R938 Abnormal findings on diagnostic imaging of other specified body structures: Secondary | ICD-10-CM | POA: Diagnosis not present

## 2014-12-05 DIAGNOSIS — Z8041 Family history of malignant neoplasm of ovary: Secondary | ICD-10-CM | POA: Diagnosis not present

## 2014-12-05 NOTE — Progress Notes (Signed)
Patient ID: Tracey Wiley, female   DOB: Oct 13, 1955, 59 y.o.   MRN: [id number]   Pt here for u/s results and ca 125. Possible EMB.  Patient is a 59 year old white female,Para 0010, with significant history of multiple familial cancers including father-colon, sister-ovarian, mother-Ovarian and endometrial, paternal aunt-breast. There is a family history of Lynch syndrome. She is menopausal, not on HRT therapy, and without vaginal bleeding. Colonoscopy was performed in 2012; is in need of retesting. Patient has been counseled regarding need for annual testing for CA-125, pelvic ultrasound, and endometrial biopsy.  Recent ultrasound demonstrates normal-appearing ovaries; uterus has endometrial stripe 6 mm with some fluid within the endometrial canal. Patient desires to wait on endometrial biopsy today; she is recovering from other multiple health issues and would like to have the endometrial sampling done within 2 weeks.  Last year, patient had to undergo hysteroscopy/D&C because of the inability to sample the uterine lining in clinic. CA-125 is drawn today.  IMPRESSION: 1.  Family history of colon cancer, ovarian cancer, breast cancer, Lynch syndrome. 2.  Needs annual ultrasound, CA-125, and endometrial biopsy; ultrasound this year is notable for 6 mm endometrial stripe and fluid within the endometrial cavity; patient will return in 2 weeks for possible endometrial biopsy versus scheduling for hysteroscopy/D&C.  In same day surgery.  CA-125 is drawn today.  PLAN: 1.  CA-125 today. 2.  Return in 2 weeks for endometrial biopsy versus scheduling hysteroscopy/D&C. 3.  Ultrasound findings reviewed.  A total of 15 minutes were spent face-to-face with the patient during this encounter and over half of that time dealt with counseling and coordination of care.

## 2014-12-05 NOTE — Telephone Encounter (Signed)
Ok to refill,  Refill sent  

## 2014-12-05 NOTE — Telephone Encounter (Signed)
Ok refill? 

## 2014-12-06 LAB — CA 125: CA 125: 17.2 U/mL (ref 0.0–38.1)

## 2014-12-11 ENCOUNTER — Ambulatory Visit (INDEPENDENT_AMBULATORY_CARE_PROVIDER_SITE_OTHER): Payer: Medicare Other | Admitting: Obstetrics and Gynecology

## 2014-12-11 ENCOUNTER — Encounter: Payer: Self-pay | Admitting: Obstetrics and Gynecology

## 2014-12-11 VITALS — BP 129/79 | HR 69 | Ht 65.0 in | Wt 219.8 lb

## 2014-12-11 DIAGNOSIS — Z800 Family history of malignant neoplasm of digestive organs: Secondary | ICD-10-CM

## 2014-12-11 DIAGNOSIS — Z1504 Genetic susceptibility to malignant neoplasm of endometrium: Secondary | ICD-10-CM

## 2014-12-11 DIAGNOSIS — R9389 Abnormal findings on diagnostic imaging of other specified body structures: Secondary | ICD-10-CM

## 2014-12-11 DIAGNOSIS — Z8041 Family history of malignant neoplasm of ovary: Secondary | ICD-10-CM

## 2014-12-11 DIAGNOSIS — R938 Abnormal findings on diagnostic imaging of other specified body structures: Secondary | ICD-10-CM | POA: Diagnosis not present

## 2014-12-11 NOTE — Progress Notes (Signed)
Patient ID: Tracey Wiley, female   DOB: 01-01-1956, 59 y.o.   MRN: [id number]   EMB- only- fh ovarian and colon cancer-    Endometrial Biopsy Procedure Note  Pre-operative Diagnosis: 1.  Family history of limb syndrome. 2.  Family history of ovarian cancer. 3.  Family history of endometrial cancer. 4.  Abnormal ultrasound with thickened endometrium-6 mm  Post-operative Diagnosis: same, And cervical stenosis  Indications:  Family history of cancer  Procedure Details   Urine pregnancy test was not done.  The risks (including infection, bleeding, pain, and uterine perforation) and benefits of the procedure were explained to the patient and Verbal informed consent was obtained.  Antibiotic prophylaxis against endocarditis was not indicated.   The patient was placed in the dorsal lithotomy position.  Bimanual exam showed the uterus to be in the neutral position.  A Graves' speculum inserted in the vagina, and the cervix was grasped with a single-tooth tenaculum.The pipette could not be introduced due to cervical stenosis.  Uterine sound could not pass through the endocervical canal.  Endocervical dilators were attempted to navigate the canal without success.  Procedure was terminated because of cervical stenosis and patient discomfort.  Condition: Stable  Complications: Cervical stenosis; unable to collect adequate tissue sample  Plan: Schedule Hysteroscopy/D&C.  The patient was advised to call for any fever or for prolonged or severe pain or bleeding. She was advised to use OTC ibuprofen as needed for mild to moderate pain. She was advised to avoid vaginal intercourse for 48 hours or until the bleeding has completely stopped.  Attending Physician Documentation:  I have seen, interviewed, and examined the patient in conjunction with the [facility].A. student and affirm the diagnosis and management plan. Tracey Wiley A. Tracey Ellender, MD, Tracey Wiley

## 2014-12-11 NOTE — Patient Instructions (Signed)
1.  Patient understands the inability to sample the endometrium because of cervical stenosis. 2.  Patient will return for preop assessment for hysteroscopy/D&C to be done in August 2016

## 2014-12-17 ENCOUNTER — Ambulatory Visit (INDEPENDENT_AMBULATORY_CARE_PROVIDER_SITE_OTHER): Payer: Medicare Other | Admitting: Internal Medicine

## 2014-12-17 ENCOUNTER — Encounter: Payer: Self-pay | Admitting: Internal Medicine

## 2014-12-17 VITALS — BP 124/74 | HR 70 | Temp 98.8°F | Resp 14 | Ht 65.0 in | Wt 223.8 lb

## 2014-12-17 DIAGNOSIS — M5441 Lumbago with sciatica, right side: Secondary | ICD-10-CM | POA: Diagnosis not present

## 2014-12-17 DIAGNOSIS — F411 Generalized anxiety disorder: Secondary | ICD-10-CM

## 2014-12-17 DIAGNOSIS — M10079 Idiopathic gout, unspecified ankle and foot: Secondary | ICD-10-CM | POA: Diagnosis not present

## 2014-12-17 DIAGNOSIS — M130 Polyarthritis, unspecified: Secondary | ICD-10-CM | POA: Diagnosis not present

## 2014-12-17 DIAGNOSIS — M1990 Unspecified osteoarthritis, unspecified site: Secondary | ICD-10-CM | POA: Insufficient documentation

## 2014-12-17 DIAGNOSIS — M1009 Idiopathic gout, multiple sites: Secondary | ICD-10-CM

## 2014-12-17 DIAGNOSIS — M069 Rheumatoid arthritis, unspecified: Secondary | ICD-10-CM | POA: Insufficient documentation

## 2014-12-17 DIAGNOSIS — E669 Obesity, unspecified: Secondary | ICD-10-CM

## 2014-12-17 DIAGNOSIS — Z800 Family history of malignant neoplasm of digestive organs: Secondary | ICD-10-CM

## 2014-12-17 DIAGNOSIS — M109 Gout, unspecified: Secondary | ICD-10-CM

## 2014-12-17 DIAGNOSIS — M2550 Pain in unspecified joint: Secondary | ICD-10-CM

## 2014-12-17 DIAGNOSIS — M5430 Sciatica, unspecified side: Secondary | ICD-10-CM | POA: Diagnosis not present

## 2014-12-17 LAB — COMPREHENSIVE METABOLIC PANEL
ALK PHOS: 63 U/L (ref 39–117)
ALT: 16 U/L (ref 0–35)
AST: 23 U/L (ref 0–37)
Albumin: 4 g/dL (ref 3.5–5.2)
BUN: 13 mg/dL (ref 6–23)
CO2: 29 meq/L (ref 19–32)
Calcium: 9.1 mg/dL (ref 8.4–10.5)
Chloride: 102 mEq/L (ref 96–112)
Creatinine, Ser: 0.8 mg/dL (ref 0.40–1.20)
GFR: 78.59 mL/min (ref >60.00)
GLUCOSE: 114 mg/dL — AB (ref 70–99)
Potassium: 3.7 mEq/L (ref 3.5–5.1)
SODIUM: 138 meq/L (ref 135–145)
TOTAL PROTEIN: 7.2 g/dL (ref 6.0–8.3)
Total Bilirubin: 0.3 mg/dL (ref 0.2–1.2)

## 2014-12-17 LAB — SEDIMENTATION RATE: Sed Rate: 52 mm/hr — ABNORMAL HIGH (ref 0–22)

## 2014-12-17 LAB — C-REACTIVE PROTEIN: CRP: 1.8 mg/dL (ref 0.5–20.0)

## 2014-12-17 LAB — URIC ACID: Uric Acid, Serum: 6.1 mg/dL (ref 2.4–7.0)

## 2014-12-17 LAB — RHEUMATOID FACTOR: Rhuematoid fact SerPl-aCnc: <10 IU/mL (ref <=14)

## 2014-12-17 MED ORDER — ETODOLAC ER 600 MG PO TB24: 600.0000 mg | ORAL_TABLET | Freq: Every day | ORAL | Status: DC

## 2014-12-17 NOTE — Progress Notes (Signed)
Pre visit review using our clinic review tool, if applicable. No additional management support is needed unless otherwise documented below in the visit note. 

## 2014-12-17 NOTE — Progress Notes (Signed)
Subjective:  Patient ID: MARICRUZ LUCERO, female    DOB: 1956/03/23  Age: 59 y.o. MRN: [id number]  CC: The primary encounter diagnosis was Polyarthritis. Diagnoses of Joint pain, Low back pain with right-sided sciatica, unspecified back pain laterality, Acute idiopathic gout of foot, unspecified laterality, Family history of Lynch syndrome, Arthritis, gouty, Obesity (BMI 30-39.9), and Generalized anxiety disorder were also pertinent to this visit.  HPI LINCOLN KLEINER presents for follow up on multiple issues including progressive weight gain, headaches and joint pain. . She has been seen by endocrinology,  No endocrine cause found for weight gain . She has had difficulty adhering to a diet due to stressful homelife .  She is still caring for her estranged husband who has been hospitalized for dehydration and weakness and has been unable to maintain an independent existence due to severe anxiety and what appears to be malingering.  She has been unwilling to divorce him because she is concerned that his sons will not take care of him.  Her relationship with her boyfriend is becoming strained due to to the persistent presence of her husband in their home.    She has developed persistent pain in her left elbow, which has caused her to change her natural sleeping habit from lying on her left, which is now intolerable,  To lying on her right side,  Which has resulted in her right shoulder becoming painful.  She wakes up daily with a neck ache .  Also getting a painful spasm in the left buttock and low midline back pain .  She has been unable to exercise due ot her joint pain and her home situation.    She notes that Multiple joints felt better when she was placed on a prednisone taper for a gout flare and is requesting evaluation for rheumatologic disease other than gout.   She is scheduled to undergo repeat endometrial biopsy /hysteroscopy  under conscious sedation next month . ,   Outpatient Prescriptions  Prior to Visit  Medication Sig Dispense Refill  . acetaminophen (TYLENOL) 500 MG tablet Take 500 mg by mouth daily as needed for pain.    Marland Kitchen albuterol (PROVENTIL HFA;VENTOLIN HFA) 108 (90 BASE) MCG/ACT inhaler Inhale 2 puffs into the lungs every 6 (six) hours as needed for wheezing or shortness of breath. 1 Inhaler 2  . ALPRAZolam (XANAX) 0.5 MG tablet Take 1 tablet (0.5 mg total) by mouth at bedtime as needed for anxiety. 31 tablet 5  . amoxicillin (AMOXIL) 500 MG capsule Take 4 capsules 1 hour prior to dental work 4 capsule 0  . aspirin EC 81 MG tablet Take 81 mg by mouth daily.    Marland Kitchen CALCIUM PO Take 1 tablet by mouth daily.    . chlorhexidine (PERIDEX) 0.12 % solution GARGLE AND SPIT 15 MLS IN THE MOUTH OR THROAT 2 (TWO) TIMES DAILY. 473 mL 0  . Cholecalciferol (VITAMIN D PO) Take 1 tablet by mouth daily.    Marland Kitchen esomeprazole (NEXIUM) 40 MG capsule Take 40 mg by mouth 2 (two) times daily before a meal.     . Flaxseed, Linseed, (FLAX SEEDS PO) Take 1 tablet by mouth daily.    . furosemide (LASIX) 20 MG tablet Take 1 tablet (20 mg total) by mouth daily. 90 tablet 3  . hyoscyamine (LEVSIN SL) 0.125 MG SL tablet Place 1 tablet (0.125 mg total) under the tongue every 4 (four) hours as needed for cramping. 60 tablet 3  . ibuprofen (ADVIL,MOTRIN) 200 MG  tablet Take 200 mg by mouth every 6 (six) hours as needed.    . lidocaine (LIDODERM) 5 % Place 1 patch onto the skin daily. Remove & Discard patch within 12 hours or as directed by MD    . MAGNESIUM PO Take 1 tablet by mouth daily.    . potassium chloride (K-DUR) 10 MEQ tablet Take 1 tablet (10 mEq total) by mouth daily. (Patient taking differently: Take 10 mEq by mouth daily as needed. ) 30 tablet 6  . simvastatin (ZOCOR) 40 MG tablet TAKE 1 TABLET (40 MG TOTAL) BY MOUTH DAILY. 90 tablet 3  . sotalol (BETAPACE) 80 MG tablet Take 1 tablet (80 mg total) by mouth 2 (two) times daily. 60 tablet 3  . triamterene-hydrochlorothiazide (DYAZIDE) 37.5-25 MG per  capsule Take 1 capsule by mouth daily.    . valACYclovir (VALTREX) 1000 MG tablet 1 tablet daily for suppression ,  Or 5 times daily for flare 35 tablet 2  . colchicine 0.6 MG tablet Take 1 tablet (0.6 mg total) by mouth daily. (Patient taking differently: Take 0.6 mg by mouth as needed. ) 30 tablet 2  . LYSINE PO Take 1 tablet by mouth daily.    Marland Kitchen MILK THISTLE EXTRACT PO Take 1 tablet by mouth daily.    . Omega-3 Fatty Acids (FISH OIL PO) Take 1 tablet by mouth daily.     No facility-administered medications prior to visit.    Review of Systems;  Patient denies headache, fevers, malaise, unintentional weight loss, skin rash, eye pain, sinus congestion and sinus pain, sore throat, dysphagia,  hemoptysis , cough, dyspnea, wheezing, chest pain, palpitations, orthopnea, edema, abdominal pain, nausea, melena, diarrhea, constipation, flank pain, dysuria, hematuria, urinary  Frequency, nocturia, numbness, tingling, seizures,  Focal weakness, Loss of consciousness,  Tremor, insomnia, depression, anxiety, and suicidal ideation.      Objective:  BP 124/74 mmHg  Pulse 70  Temp(Src) 98.8 F (37.1 C) (Oral)  Resp 14  Ht '5\' 5"'  (1.651 m)  Wt 223 lb 12.8 oz (101.515 kg)  BMI 37.24 kg/m2  SpO2 97%  BP Readings from Last 3 Encounters:  12/17/14 124/74  12/11/14 129/79  12/05/14 129/85    Wt Readings from Last 3 Encounters:  12/17/14 223 lb 12.8 oz (101.515 kg)  12/11/14 219 lb 12.8 oz (99.701 kg)  12/05/14 220 lb 14.4 oz (100.2 kg)    General appearance: alert, cooperative and appears stated age Ears: normal TM's and external ear canals both ears Throat: lips, mucosa, and tongue normal; teeth and gums normal Neck: no adenopathy, no carotid bruit, supple, symmetrical, trachea midline and thyroid not enlarged, symmetric, no tenderness/mass/nodules Back: symmetric, no curvature. ROM normal. No CVA tenderness. Lungs: clear to auscultation bilaterally Heart: regular rate and rhythm, S1, S2  normal, no murmur, click, rub or gallop Abdomen: soft, non-tender; bowel sounds normal; no masses,  no organomegaly Pulses: 2+ and symmetric Skin: Skin color, texture, turgor normal. No rashes or lesions Lymph nodes: Cervical, supraclavicular, and axillary nodes normal.  Lab Results  Component Value Date   HGBA1C 6.3 07/25/2014   HGBA1C 6.1 12/19/2012    Lab Results  Component Value Date   CREATININE 0.80 12/17/2014   CREATININE 0.9 05/10/2014   CREATININE 0.8 01/24/2014    Lab Results  Component Value Date   WBC 8.5 01/05/2014   HGB 13.6 01/05/2014   HCT 40.0 01/05/2014   PLT 267 01/05/2014   GLUCOSE 114* 12/17/2014   CHOL 184 01/24/2014   TRIG 140.0  01/24/2014   HDL 47.10 01/24/2014   LDLCALC 109* 01/24/2014   ALT 16 12/17/2014   AST 23 12/17/2014   NA 138 12/17/2014   K 3.7 12/17/2014   CL 102 12/17/2014   CREATININE 0.80 12/17/2014   BUN 13 12/17/2014   CO2 29 12/17/2014   TSH 2.07 07/25/2014   HGBA1C 6.3 07/25/2014    No results found.  Assessment & Plan:   Problem List Items Addressed This Visit      Unprioritized   Obesity (BMI 30-39.9)    I have addressed  BMI and recommended a low glycemic index diet utilizing smaller more frequent meals to increase metabolism.  I have also recommended that patient start exercising with a goal of 30 minutes of aerobic exercise a minimum of 5 days per week.       Generalized anxiety disorder    Complicated by the impossible predicament she has allowed to continue.  Long  Discussion about her living situation which is strained by the in home care of her estranged husband who appears to have no organic diagnosis other than generalized anxiety and has secondary gain by remaining unable to care for himself.       Arthritis, gouty    Serologies for auto  immune forms of arthritis were negative, but  ESR was elevated and uric acid level is slightly elevated.  Recommend trial of colchicine bid x 1 week.  If no improvement   Sports medicine referral.       Joint pain    She has no signs of synovitis on exam, and serologies were normal.  Will treat for gout given elevated ESR and elevated uric acid level of 6.1   Lab Results  Component Value Date   ESRSEDRATE 52* 12/17/2014         Relevant Orders   Ambulatory referral to Sports Medicine   Family history of Lynch syndrome    Other Visit Diagnoses    Polyarthritis    -  Primary    Relevant Orders    Sedimentation rate (Completed)    Comprehensive metabolic panel (Completed)    ANA w/Reflex if Positive (Completed)    Uric acid (Completed)    Rheumatoid factor (Completed)    C-reactive protein (Completed)    Cyclic citrul peptide antibody, IgG (Completed)    Low back pain with right-sided sciatica, unspecified back pain laterality        Relevant Medications    etodolac (LODINE XL) 600 MG 24 hr tablet    Other Relevant Orders    HLA-B27 antigen (Completed)    Ambulatory referral to Sports Medicine      A total of 40 minutes was spent with patient more than half of which was spent in counseling patient on the above mentioned issues , reviewing and explaining recent labs and imaging studies done, and coordination of care.   I have discontinued Ms. Gest LYSINE PO, Omega-3 Fatty Acids (FISH OIL PO), and MILK THISTLE EXTRACT PO. I am also having her start on etodolac. Additionally, I am having her maintain her acetaminophen, aspirin EC, esomeprazole, triamterene-hydrochlorothiazide, CALCIUM PO, Cholecalciferol (VITAMIN D PO), (Flaxseed, Linseed, (FLAX SEEDS PO)), MAGNESIUM PO, albuterol, hyoscyamine, chlorhexidine, furosemide, potassium chloride, ALPRAZolam, ibuprofen, lidocaine, amoxicillin, simvastatin, sotalol, and valACYclovir.  Meds ordered this encounter  Medications  . etodolac (LODINE XL) 600 MG 24 hr tablet    Sig: Take 1 tablet (600 mg total) by mouth daily.    Dispense:  30 tablet  Refill:  3    Medications Discontinued During  This Encounter  Medication Reason  . LYSINE PO Completed Course  . MILK THISTLE EXTRACT PO Completed Course  . Omega-3 Fatty Acids (FISH OIL PO) Completed Course    Follow-up: No Follow-up on file.   Crecencio Mc, MD

## 2014-12-18 LAB — HLA-B27 ANTIGEN: DNA RESULT: NOT DETECTED

## 2014-12-18 LAB — CYCLIC CITRUL PEPTIDE ANTIBODY, IGG: Cyclic Citrullin Peptide Ab: <2 U/mL (ref 0.0–5.0)

## 2014-12-18 LAB — ANA W/REFLEX IF POSITIVE: ANA: NEGATIVE

## 2014-12-19 ENCOUNTER — Other Ambulatory Visit: Payer: Self-pay | Admitting: Internal Medicine

## 2014-12-19 ENCOUNTER — Ambulatory Visit: Payer: Medicare Other | Admitting: Obstetrics and Gynecology

## 2014-12-19 MED ORDER — COLCHICINE 0.6 MG PO TABS: 0.6000 mg | ORAL_TABLET | Freq: Two times a day (BID) | ORAL | Status: DC

## 2014-12-20 DIAGNOSIS — Z800 Family history of malignant neoplasm of digestive organs: Secondary | ICD-10-CM | POA: Insufficient documentation

## 2014-12-20 NOTE — Assessment & Plan Note (Signed)
She has no signs of synovitis on exam, and serologies were normal.  Will treat for gout given elevated ESR and elevated uric acid level of 6.1   Lab Results  Component Value Date   ESRSEDRATE 52* 12/17/2014    

## 2014-12-20 NOTE — Assessment & Plan Note (Signed)
Serologies for auto  immune forms of arthritis were negative, but  ESR was elevated and uric acid level is slightly elevated.  Recommend trial of colchicine bid x 1 week.  If no improvement  Sports medicine referral.

## 2014-12-20 NOTE — Assessment & Plan Note (Signed)
I have addressed  BMI and recommended a low glycemic index diet utilizing smaller more frequent meals to increase metabolism.  I have also recommended that patient start exercising with a goal of 30 minutes of aerobic exercise a minimum of 5 days per week.  

## 2014-12-20 NOTE — Assessment & Plan Note (Signed)
Complicated by the impossible predicament she has allowed to continue.  Long  Discussion about her living situation which is strained by the in home care of her estranged husband who appears to have no organic diagnosis other than generalized anxiety and has secondary gain by remaining unable to care for himself.

## 2014-12-26 ENCOUNTER — Telehealth: Payer: Self-pay | Admitting: *Deleted

## 2014-12-26 NOTE — Telephone Encounter (Signed)
Spoke with pt, advised of MDs message.  Pt verbalized understanding 

## 2014-12-26 NOTE — Telephone Encounter (Signed)
Pt called states her Elbow is better, however she began having severe diarrhea since Monday.  States she started taking the medication on Thursday and the diarrhea started on Monday.

## 2014-12-26 NOTE — Telephone Encounter (Signed)
TELL HER TO STOP THE COLCHICINE.  SHE CAN USE IMMODIUM IF THE DIARRHEA DOES NOT RESOLVE

## 2014-12-30 ENCOUNTER — Ambulatory Visit (INDEPENDENT_AMBULATORY_CARE_PROVIDER_SITE_OTHER): Payer: Medicare Other | Admitting: Internal Medicine

## 2014-12-30 ENCOUNTER — Encounter: Payer: Self-pay | Admitting: Internal Medicine

## 2014-12-30 VITALS — BP 108/72 | HR 79 | Temp 98.1°F | Resp 14 | Ht 65.0 in | Wt 221.8 lb

## 2014-12-30 DIAGNOSIS — M064 Inflammatory polyarthropathy: Secondary | ICD-10-CM | POA: Diagnosis not present

## 2014-12-30 DIAGNOSIS — M25579 Pain in unspecified ankle and joints of unspecified foot: Secondary | ICD-10-CM | POA: Diagnosis not present

## 2014-12-30 DIAGNOSIS — E669 Obesity, unspecified: Secondary | ICD-10-CM

## 2014-12-30 DIAGNOSIS — M1990 Unspecified osteoarthritis, unspecified site: Secondary | ICD-10-CM

## 2014-12-30 NOTE — Progress Notes (Signed)
Pre-visit discussion using our clinic review tool. No additional management support is needed unless otherwise documented below in the visit note.  

## 2014-12-30 NOTE — Patient Instructions (Signed)
You can resume the colchicine one time daily   I am referring you to Dr Jefm Bryant for further evaluation of your joint pain   You can either start the lodine or the colchicine  But NOT BOTH,  The  diet I discussed with you today is the 10 day Green Smoothie Cleansing /Detox Diet by Linden Dolin . available on [employer] for around $10.  This is not a low carb or a weight loss diet,  It is fundamentally a "cleansing" low fat diet that eliminates sugar, gluten, caffeine, alcohol and dairy for 10 days .  What you add back after the initial ten days is entirely up to  you!  You can expect to lose 5 to 10 lbs depending on how strict you are.   I suggest drinking 2 smoothies daily and keeping one chewable meal (but keep it simple, like baked fish and salad, rice or bok choy) .  You snack primarily on fresh  fruit, egg whites and judicious quantities of nuts. You can add vegetable based protein powder (nothing with whey , since whey is dairy) in it.  [employer] has a [employer], political party .  I suggest using /drinking 2 smoothies daily and have one sensible low fat meal ,  The snacks are primarily fruit, egg whites and nuts.   It does require some form of a nutrient extractor (Vita Mix, a electric juicer,  Or a Nutribullet Rx).  i have found that using frozen fruits is much more convenient and cost effective. You can even find plenty of organic fruit in the frozen fruit section of [employer].  Just thaw what you need for the following day the night before in the refrigerator (to avoid jamming up your machine)

## 2014-12-30 NOTE — Progress Notes (Signed)
Subjective:  Patient ID: Tracey Wiley, female    DOB: [birth year]  Age: 59 y.o. MRN: [id number]  CC: The primary encounter diagnosis was Pain in joint, ankle and foot, unspecified laterality. Diagnoses of Inflammatory arthritis and Obesity (BMI 30-39.9) were also pertinent to this visit.  HPI Tracey Wiley presents for follow up on left elbow pain.  Patient was treated for presume gout flare with colchicine bid .  Pain resolved but after 5 days developed profuse diarrhea .  Colchicine was stopped 5 days ago and the diarrhea has resolved but the elbow and now her  left foot are hurting again.  She reports edema in the foot but the only edema seen is that caused by her shoe straps ,     History:  The diagnosis of gout was made presumptively prior to relocating to [place] from [place].  She has no history of confirmation of gout by of joint aspiration.  Father had recurrent gout.  Patient was teated by ER physician assistant  for presumed gout  In August 2015 when she presented to [facility] ER with sudden onset of painful left wrist, no history of trauma.   ESR was 40 then , uric acid level 5.7 , CBC normal.  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 She was treated with toradol and 21 days of once daily colchicine.  She has never had a uric acid level above 6.1 on repeat checks.   She has had 3 flares of inflammatory arthritis that responded to colchicine.  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 She also has excessive weight gain and does not want to take prednisone if she doesn't have to.  Would like to resume treatment of current foot and elbow pain with once daily colchin once daily. Option of Lodine given and  recommended.     Outpatient Prescriptions Prior to Visit  Medication Sig Dispense Refill  . acetaminophen (TYLENOL) 500 MG tablet Take 500 mg by mouth daily as needed for pain.    Marland Kitchen albuterol (PROVENTIL HFA;VENTOLIN HFA) 108 (90 BASE) MCG/ACT inhaler Inhale 2 puffs into the lungs every 6 (six) hours as needed for wheezing or shortness of breath. 1 Inhaler 2  . ALPRAZolam (XANAX) 0.5 MG tablet Take 1 tablet (0.5 mg total) by mouth at bedtime as needed for anxiety. 31 tablet 5  . amoxicillin (AMOXIL) 500 MG capsule Take 4 capsules 1 hour prior to dental work 4 capsule 0  . aspirin EC 81 MG tablet Take 81 mg by mouth daily.    Marland Kitchen CALCIUM PO Take 1 tablet by mouth daily.    . chlorhexidine (PERIDEX) 0.12 % solution GARGLE AND SPIT 15 MLS IN THE MOUTH OR THROAT 2 (TWO) TIMES DAILY. 473 mL 0  . Cholecalciferol (VITAMIN D PO) Take 1 tablet by mouth daily.    Marland Kitchen esomeprazole (NEXIUM) 40 MG capsule Take 40 mg by mouth 2 (two) times daily before a meal.     . etodolac (LODINE XL) 600 MG 24 hr tablet Take 1 tablet (600 mg total) by mouth daily. 30 tablet 3  . Flaxseed, Linseed, (FLAX SEEDS PO) Take 1 tablet by mouth daily.    Marland Kitchen  furosemide (LASIX) 20 MG tablet Take 1 tablet (20 mg total) by mouth daily. 90 tablet 3  . hyoscyamine (LEVSIN SL) 0.125 MG SL tablet Place 1 tablet (0.125 mg total) under the tongue every 4 (four) hours as needed for cramping. 60 tablet 3  . lidocaine (LIDODERM) 5 % Place 1 patch onto the skin daily. Remove & Discard patch within 12 hours or as directed by MD    . MAGNESIUM PO Take 1 tablet by mouth daily.    . potassium chloride (K-DUR) 10 MEQ tablet Take 1 tablet (10 mEq total) by mouth daily. (Patient taking differently: Take 10 mEq by mouth daily as needed. ) 30 tablet 6  . simvastatin (ZOCOR) 40 MG tablet TAKE 1 TABLET (40 MG TOTAL) BY MOUTH DAILY. 90 tablet 3  . sotalol (BETAPACE) 80 MG tablet Take 1 tablet (80 mg total) by mouth 2 (two) times daily. 60 tablet 3  .  triamterene-hydrochlorothiazide (DYAZIDE) 37.5-25 MG per capsule Take 1 capsule by mouth daily.    . valACYclovir (VALTREX) 1000 MG tablet 1 tablet daily for suppression ,  Or 5 times daily for flare 35 tablet 2  . colchicine 0.6 MG tablet Take 1 tablet (0.6 mg total) by mouth 2 (two) times daily. (Patient not taking: Reported on 12/30/2014) 60 tablet 2  . ibuprofen (ADVIL,MOTRIN) 200 MG tablet Take 200 mg by mouth every 6 (six) hours as needed.     No facility-administered medications prior to visit.    Review of Systems;  Patient denies headache, fevers, malaise, unintentional weight loss, skin rash, eye pain, sinus congestion and sinus pain, sore throat, dysphagia,  hemoptysis , cough, dyspnea, wheezing, chest pain, palpitations, orthopnea, edema, abdominal pain, nausea, melena, diarrhea, constipation, flank pain, dysuria, hematuria, urinary  Frequency, nocturia, numbness, tingling, seizures,  Focal weakness, Loss of consciousness,  Tremor, insomnia, depression, anxiety, and suicidal ideation.      Objective:  BP 108/72 mmHg  Pulse 79  Temp(Src) 98.1 F (36.7 C) (Oral)  Resp 14  Ht _0  (1.651 m)  Wt 221 lb 12 oz (100.585 kg)  BMI 36.90 kg/m2  SpO2 97%  BP Readings from Last 3 Encounters:  12/30/14 108/72  12/17/14 124/74  12/11/14 129/79    Wt Readings from Last 3 Encounters:  12/30/14 221 lb 12 oz (100.585 kg)  12/17/14 223 lb 12.8 oz (101.515 kg)  12/11/14 219 lb 12.8 oz (99.701 kg)    General appearance: alert, cooperative and appears stated age  Neck: no adenopathy, no carotid bruit, supple, symmetrical, trachea midline and thyroid not enlarged, symmetric, no tenderness/mass/nodules Back: symmetric, no curvature. ROM normal. No CVA tenderness. Lungs: clear to auscultation bilaterally Heart: regular rate and rhythm, S1, S2 normal, no murmur, click, rub or gallop Abdomen: soft, non-tender; bowel sounds normal; no masses,  no organomegaly Pulses: 2+ and  symmetric Skin: Skin color, texture, turgor normal. No rashes or lesions MSK: left foot, left elbow  without erythema or synovitis. Trace pitting edema caused by straps of sandals. No tophi Lymph nodes: Cervical, supraclavicular, and axillary nodes normal.  Lab Results  Component Value Date   HGBA1C 6.3 07/25/2014   HGBA1C 6.1 12/19/2012    Lab Results  Component Value Date   CREATININE 0.80 12/17/2014   CREATININE 0.9 05/10/2014   CREATININE 0.8 01/24/2014    Lab Results  Component Value Date   WBC 8.5 01/05/2014   HGB 13.6 01/05/2014   HCT 40.0 01/05/2014   PLT 267 01/05/2014   GLUCOSE 114*  12/17/2014   CHOL 184 01/24/2014   TRIG 140.0 01/24/2014   HDL 47.10 01/24/2014   LDLCALC 109* 01/24/2014   ALT 16 12/17/2014   AST 23 12/17/2014   NA 138 12/17/2014   K 3.7 12/17/2014   CL 102 12/17/2014   CREATININE 0.80 12/17/2014   BUN 13 12/17/2014   CO2 29 12/17/2014   TSH 2.07 07/25/2014   HGBA1C 6.3 07/25/2014    No results found.  Assessment & Plan:   Problem List Items Addressed This Visit      Unprioritized   Obesity (BMI 30-39.9)    Worsening despite patient's concern .  a1c now 6.3 so she appears to have developed impair fasting glucose if not type 2 DM , given no actual fasting glucose of 125.  I have addressed  BMI and recommended a low glycemic index diet utilizing smaller more frequent meals to increase metabolism.  I have also recommended that patient start exercising with a goal of 30 minutes of aerobic exercise a minimum of 5 days per week. Screening for lipid disorders, thyroid and diabetes has been done  Lab Results  Component Value Date   HGBA1C 6.3 07/25/2014   Lab Results  Component Value Date   TSH 2.07 07/25/2014   Lab Results  Component Value Date   CHOL 184 01/24/2014   HDL 47.10 01/24/2014   LDLCALC 109* 01/24/2014   TRIG 140.0 01/24/2014   CHOLHDL 4 01/24/2014         Inflammatory arthritis     her recurrent episodes of left  elbow, left wrist and left foot pain respond to colchicine but do not appear typical for gout . Serologies for autoimmune causes were negative in the past year. Referral to Dr Jefm Bryant recommended.    Lab Results  Component Value Date   ESRSEDRATE 87* 12/17/2014   Lab Results  Component Value Date   ANA Negative 12/17/2014   RF <10 12/17/2014         Other Visit Diagnoses    Pain in joint, ankle and foot, unspecified laterality    -  Primary    Relevant Orders    Ambulatory referral to Rheumatology       I have discontinued Ms. Struthers ibuprofen. I am also having her maintain her acetaminophen, aspirin EC, esomeprazole, triamterene-hydrochlorothiazide, CALCIUM PO, Cholecalciferol (VITAMIN D PO), (Flaxseed, Linseed, (FLAX SEEDS PO)), MAGNESIUM PO, albuterol, hyoscyamine, chlorhexidine, furosemide, potassium chloride, ALPRAZolam, lidocaine, amoxicillin, simvastatin, sotalol, valACYclovir, etodolac, and colchicine.  No orders of the defined types were placed in this encounter.    Medications Discontinued During This Encounter  Medication Reason  . ibuprofen (ADVIL,MOTRIN) 200 MG tablet Change in therapy    Follow-up: No Follow-up on file.   Crecencio Mc, MD

## 2014-12-31 NOTE — Assessment & Plan Note (Addendum)
her recurrent episodes of left elbow, left wrist and left foot pain respond to colchicine but do not appear typical for gout . Serologies for autoimmune causes were negative in the past year. Referral to Dr Jefm Bryant recommended.    Lab Results  Component Value Date   ESRSEDRATE 85* 12/17/2014   Lab Results  Component Value Date   ANA Negative 12/17/2014   RF <10 12/17/2014

## 2014-12-31 NOTE — Assessment & Plan Note (Addendum)
Worsening despite patient's concern .  a1c now 6.3 so she appears to have developed impair fasting glucose if not type 2 DM , given no actual fasting glucose of 125.  I have addressed  BMI and recommended a low glycemic index diet utilizing smaller more frequent meals to increase metabolism.  I have also recommended that patient start exercising with a goal of 30 minutes of aerobic exercise a minimum of 5 days per week. Screening for lipid disorders, thyroid and diabetes has been done  Lab Results  Component Value Date   HGBA1C 6.3 07/25/2014   Lab Results  Component Value Date   TSH 2.07 07/25/2014   Lab Results  Component Value Date   CHOL 184 01/24/2014   HDL 47.10 01/24/2014   LDLCALC 109* 01/24/2014   TRIG 140.0 01/24/2014   CHOLHDL 4 01/24/2014

## 2015-01-02 ENCOUNTER — Ambulatory Visit (INDEPENDENT_AMBULATORY_CARE_PROVIDER_SITE_OTHER): Payer: Medicare Other | Admitting: Obstetrics and Gynecology

## 2015-01-02 ENCOUNTER — Encounter: Payer: Self-pay | Admitting: Obstetrics and Gynecology

## 2015-01-02 VITALS — BP 108/71 | HR 60 | Ht 65.0 in | Wt 222.1 lb

## 2015-01-02 DIAGNOSIS — Z01818 Encounter for other preprocedural examination: Secondary | ICD-10-CM

## 2015-01-02 DIAGNOSIS — Z1504 Genetic susceptibility to malignant neoplasm of endometrium: Secondary | ICD-10-CM

## 2015-01-02 DIAGNOSIS — R9389 Abnormal findings on diagnostic imaging of other specified body structures: Secondary | ICD-10-CM

## 2015-01-02 DIAGNOSIS — Z800 Family history of malignant neoplasm of digestive organs: Secondary | ICD-10-CM

## 2015-01-02 DIAGNOSIS — R938 Abnormal findings on diagnostic imaging of other specified body structures: Secondary | ICD-10-CM

## 2015-01-02 DIAGNOSIS — Z8041 Family history of malignant neoplasm of ovary: Secondary | ICD-10-CM
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
## 2015-01-06 ENCOUNTER — Other Ambulatory Visit: Payer: Self-pay

## 2015-01-06 MED ORDER — ESOMEPRAZOLE MAGNESIUM 40 MG PO CPDR: 40.0000 mg | DELAYED_RELEASE_CAPSULE | Freq: Two times a day (BID) | ORAL | Status: DC

## 2015-01-06 NOTE — Telephone Encounter (Signed)
Received Refill Request from [facility] at this time. Pt last seen in the office 05/2014. Given enough refills until 05/2015. Will need appointment for further refills.

## 2015-01-13 ENCOUNTER — Encounter
Admission: RE | Admit: 2015-01-13 | Discharge: 2015-01-13 | Disposition: A | Discharge status: Home / Self Care | Payer: Medicare Other | Source: Ambulatory Visit | Attending: Obstetrics and Gynecology | Admitting: Obstetrics and Gynecology

## 2015-01-13 DIAGNOSIS — Z01812 Encounter for preprocedural laboratory examination: Secondary | ICD-10-CM | POA: Insufficient documentation

## 2015-01-13 DIAGNOSIS — Z01810 Encounter for preprocedural cardiovascular examination: Secondary | ICD-10-CM | POA: Diagnosis not present

## 2015-01-13 DIAGNOSIS — I499 Cardiac arrhythmia, unspecified: Secondary | ICD-10-CM | POA: Diagnosis not present

## 2015-01-13 HISTORY — DX: Unspecified osteoarthritis, unspecified site: M19.90

## 2015-01-13 HISTORY — DX: Headache: R51

## 2015-01-13 HISTORY — DX: Cardiac arrhythmia, unspecified: I49.9

## 2015-01-13 HISTORY — DX: Gout, unspecified: M10.9

## 2015-01-13 HISTORY — DX: Headache, unspecified: R51.9

## 2015-01-13 HISTORY — DX: Personal history of other diseases of the digestive system: Z87.19

## 2015-01-13 HISTORY — DX: Irritable bowel syndrome, unspecified: K58.9

## 2015-01-13 LAB — CBC WITH DIFFERENTIAL/PLATELET
BASOS PCT: 1 %
Basophils Absolute: 0.1 10*3/uL (ref 0–0.1)
Eosinophils Absolute: 0.4 10*3/uL (ref 0–0.7)
Eosinophils Relative: 6 %
HCT: 36.4 % (ref 35.0–47.0)
HEMOGLOBIN: 12.1 g/dL (ref 12.0–16.0)
Lymphocytes Relative: 34 %
Lymphs Abs: 2.3 10*3/uL (ref 1.0–3.6)
MCH: 27.1 pg (ref 26.0–34.0)
MCHC: 33.2 g/dL (ref 32.0–36.0)
MCV: 81.7 fL (ref 80.0–100.0)
MONOS PCT: 7 %
Monocytes Absolute: 0.5 10*3/uL (ref 0.2–0.9)
NEUTROS ABS: 3.6 10*3/uL (ref 1.4–6.5)
Neutrophils Relative %: 52 %
Platelets: 292 10*3/uL (ref 150–440)
RBC: 4.46 MIL/uL (ref 3.80–5.20)
RDW: 15.9 % — ABNORMAL HIGH (ref 11.5–14.5)
WBC: 6.9 10*3/uL (ref 3.6–11.0)

## 2015-01-13 LAB — RAPID HIV SCREEN (HIV 1/2 AB+AG)
HIV 1/2 ANTIBODIES: NONREACTIVE
HIV-1 P24 Antigen - HIV24: NONREACTIVE

## 2015-01-13 NOTE — Patient Instructions (Signed)
  Your procedure is scheduled on: January 20, 2015 (Monday) Report to Same Day Surgery. To find out your arrival time please call [phone] between 1PM - 3PM on January 17, 2015 (Friday).  Remember: Instructions that are not followed completely may result in serious medical risk, up to and including death, or upon the discretion of your surgeon and anesthesiologist your surgery may need to be rescheduled.    _x___ 1. Do not eat food or drink liquids after midnight. No gum chewing or hard candies.     ____ 2. No Alcohol for 24 hours before or after surgery.   ____ 3. Bring all medications with you on the day of surgery if instructed.    _x___ 4. Notify your doctor if there is any change in your medical condition     (cold, fever, infections).     Do not wear jewelry, make-up, hairpins, clips or nail polish.  Do not wear lotions, powders, or perfumes. You may wear deodorant.  Do not shave 48 hours prior to surgery. Men may shave face and neck.  Do not bring valuables to the hospital.    [facility] is not responsible for any belongings or valuables.               Contacts, dentures or bridgework may not be worn into surgery.  Leave your suitcase in the car. After surgery it may be brought to your room.  For patients admitted to the hospital, discharge time is determined by your                treatment team.   Patients discharged the day of surgery will not be allowed to drive home.   Please read over the following fact sheets that you were given:   Surgical Site Infection Prevention   _x___ Take these medicines the morning of surgery with A SIP OF WATER:    1. Nexium  2. Betapace  3.   4.  5.  6.  ____ Fleet Enema (as directed)   ____ Use CHG Soap as directed  __x__ Use inhalers on the day of surgery (ALBUTEROL )  ____ Stop metformin 2 days prior to surgery    ____ Take 1/2 of usual insulin dose the night before surgery and none on the morning of surgery.   __x__  Stop Coumadin/Plavix/aspirin on (CHECK WITH DR. DEFRANCESCO OFFICE ABOUT STOPPING ASPIRIN)  __x__ Stop Anti-inflammatories on (STOP ETODOLAC NOW)   ____ Stop supplements until after surgery.    ____ Bring C-Pap to the hospital.

## 2015-01-14 LAB — RPR: RPR Ser Ql: NONREACTIVE

## 2015-01-20 ENCOUNTER — Encounter
Admission: RE | Disposition: A | Discharge status: Home / Self Care | Payer: Self-pay | Source: Ambulatory Visit | Attending: Obstetrics and Gynecology

## 2015-01-20 ENCOUNTER — Ambulatory Visit
Admission: RE | Admit: 2015-01-20 | Discharge: 2015-01-20 | Disposition: A | Discharge status: Home / Self Care | Payer: Medicare Other | Source: Ambulatory Visit | Attending: Obstetrics and Gynecology | Admitting: Obstetrics and Gynecology

## 2015-01-20 ENCOUNTER — Ambulatory Visit: Payer: Medicare Other | Admitting: *Deleted

## 2015-01-20 ENCOUNTER — Encounter: Payer: Self-pay | Admitting: *Deleted

## 2015-01-20 DIAGNOSIS — G8918 Other acute postprocedural pain: Secondary | ICD-10-CM

## 2015-01-20 DIAGNOSIS — E669 Obesity, unspecified: Secondary | ICD-10-CM | POA: Diagnosis not present

## 2015-01-20 DIAGNOSIS — R58 Hemorrhage, not elsewhere classified: Secondary | ICD-10-CM | POA: Insufficient documentation

## 2015-01-20 DIAGNOSIS — Z86718 Personal history of other venous thrombosis and embolism: Secondary | ICD-10-CM | POA: Diagnosis not present

## 2015-01-20 DIAGNOSIS — K219 Gastro-esophageal reflux disease without esophagitis: Secondary | ICD-10-CM | POA: Insufficient documentation

## 2015-01-20 DIAGNOSIS — N882 Stricture and stenosis of cervix uteri: Secondary | ICD-10-CM | POA: Diagnosis not present

## 2015-01-20 DIAGNOSIS — Z881 Allergy status to other antibiotic agents status: Secondary | ICD-10-CM | POA: Insufficient documentation

## 2015-01-20 DIAGNOSIS — E785 Hyperlipidemia, unspecified: Secondary | ICD-10-CM | POA: Insufficient documentation

## 2015-01-20 DIAGNOSIS — N950 Postmenopausal bleeding: Secondary | ICD-10-CM | POA: Diagnosis not present

## 2015-01-20 DIAGNOSIS — F419 Anxiety disorder, unspecified: Secondary | ICD-10-CM | POA: Insufficient documentation

## 2015-01-20 DIAGNOSIS — E559 Vitamin D deficiency, unspecified: Secondary | ICD-10-CM | POA: Diagnosis not present

## 2015-01-20 DIAGNOSIS — Z885 Allergy status to narcotic agent status: Secondary | ICD-10-CM | POA: Diagnosis not present

## 2015-01-20 DIAGNOSIS — Z8041 Family history of malignant neoplasm of ovary: Secondary | ICD-10-CM | POA: Diagnosis not present

## 2015-01-20 DIAGNOSIS — Z8049 Family history of malignant neoplasm of other genital organs: Secondary | ICD-10-CM | POA: Insufficient documentation

## 2015-01-20 DIAGNOSIS — Z809 Family history of malignant neoplasm, unspecified: Secondary | ICD-10-CM | POA: Diagnosis not present

## 2015-01-20 DIAGNOSIS — I10 Essential (primary) hypertension: Secondary | ICD-10-CM | POA: Diagnosis not present

## 2015-01-20 DIAGNOSIS — R938 Abnormal findings on diagnostic imaging of other specified body structures: Secondary | ICD-10-CM | POA: Diagnosis not present

## 2015-01-20 DIAGNOSIS — Z124 Encounter for screening for malignant neoplasm of cervix: Secondary | ICD-10-CM | POA: Diagnosis not present

## 2015-01-20 DIAGNOSIS — K631 Perforation of intestine (nontraumatic): Secondary | ICD-10-CM | POA: Diagnosis not present

## 2015-01-20 HISTORY — PX: LAPAROSCOPY: SHX197

## 2015-01-20 HISTORY — PX: HYSTEROSCOPY WITH D & C: SHX1775

## 2015-01-20 SURGERY — DILATATION AND CURETTAGE /HYSTEROSCOPY
Anesthesia: General | Wound class: Clean Contaminated

## 2015-01-20 MED ORDER — SUGAMMADEX SODIUM 200 MG/2ML IV SOLN
INTRAVENOUS | Status: DC | PRN
  Administered 2015-01-20: 200 mg via INTRAVENOUS

## 2015-01-20 MED ORDER — GLYCOPYRROLATE 0.2 MG/ML IJ SOLN
INTRAMUSCULAR | Status: DC | PRN
  Administered 2015-01-20: 0.2 mg via INTRAVENOUS

## 2015-01-20 MED ORDER — SODIUM CHLORIDE 0.9 % IJ SOLN
INTRAMUSCULAR | Status: AC
  Filled 2015-01-20: qty 10

## 2015-01-20 MED ORDER — ACETAMINOPHEN 10 MG/ML IV SOLN
INTRAVENOUS | Status: DC | PRN
  Administered 2015-01-20: 1000 mg via INTRAVENOUS

## 2015-01-20 MED ORDER — MIDAZOLAM HCL 2 MG/2ML IJ SOLN
INTRAMUSCULAR | Status: DC | PRN
  Administered 2015-01-20: 2 mg via INTRAVENOUS

## 2015-01-20 MED ORDER — OXYCODONE-ACETAMINOPHEN 5-325 MG PO TABS: 1.0000 | ORAL_TABLET | ORAL | Status: DC | PRN

## 2015-01-20 MED ORDER — EPHEDRINE SULFATE 50 MG/ML IJ SOLN
INTRAMUSCULAR | Status: DC | PRN
  Administered 2015-01-20: 10 mg via INTRAVENOUS

## 2015-01-20 MED ORDER — SUCCINYLCHOLINE CHLORIDE 20 MG/ML IJ SOLN
INTRAMUSCULAR | Status: DC | PRN
Start: 2015-01-20 — End: 2015-01-20
  Administered 2015-01-20: 100 mg via INTRAVENOUS

## 2015-01-20 MED ORDER — PROPOFOL INFUSION 10 MG/ML OPTIME
INTRAVENOUS | Status: DC | PRN
  Administered 2015-01-20: 150 ug/kg/min via INTRAVENOUS

## 2015-01-20 MED ORDER — LIDOCAINE HCL (CARDIAC) 20 MG/ML IV SOLN
INTRAVENOUS | Status: DC | PRN
  Administered 2015-01-20: 100 mg via INTRAVENOUS

## 2015-01-20 MED ORDER — DEXAMETHASONE SODIUM PHOSPHATE 4 MG/ML IJ SOLN
INTRAMUSCULAR | Status: DC | PRN
  Administered 2015-01-20: 10 mg via INTRAVENOUS

## 2015-01-20 MED ORDER — ROCURONIUM BROMIDE 100 MG/10ML IV SOLN
INTRAVENOUS | Status: DC | PRN
  Administered 2015-01-20: 30 mg via INTRAVENOUS
  Administered 2015-01-20: 20 mg via INTRAVENOUS

## 2015-01-20 MED ORDER — PROMETHAZINE HCL 25 MG/ML IJ SOLN
INTRAMUSCULAR | Status: AC
  Administered 2015-01-20: 12.5 mg via INTRAVENOUS
  Filled 2015-01-20: qty 1

## 2015-01-20 MED ORDER — ACETAMINOPHEN 10 MG/ML IV SOLN
INTRAVENOUS | Status: AC
  Filled 2015-01-20: qty 100

## 2015-01-20 MED ORDER — ONDANSETRON HCL 4 MG/2ML IJ SOLN
INTRAMUSCULAR | Status: DC | PRN
  Administered 2015-01-20: 4 mg via INTRAVENOUS

## 2015-01-20 MED ORDER — FENTANYL CITRATE (PF) 100 MCG/2ML IJ SOLN
INTRAMUSCULAR | Status: AC
  Administered 2015-01-20: 25 ug via INTRAVENOUS
  Filled 2015-01-20: qty 2

## 2015-01-20 MED ORDER — METOCLOPRAMIDE HCL 5 MG/ML IJ SOLN
INTRAMUSCULAR | Status: DC | PRN
  Administered 2015-01-20: 10 mg via INTRAVENOUS

## 2015-01-20 MED ORDER — PROPOFOL 10 MG/ML IV BOLUS
INTRAVENOUS | Status: DC | PRN
  Administered 2015-01-20: 200 mg via INTRAVENOUS
  Administered 2015-01-20: 100 mg via INTRAVENOUS

## 2015-01-20 MED ORDER — FENTANYL CITRATE (PF) 100 MCG/2ML IJ SOLN
25.0000 ug | INTRAMUSCULAR | Status: DC | PRN
  Administered 2015-01-20 (×2): 25 ug via INTRAVENOUS

## 2015-01-20 MED ORDER — FENTANYL CITRATE (PF) 100 MCG/2ML IJ SOLN
INTRAMUSCULAR | Status: DC | PRN
  Administered 2015-01-20: 50 ug via INTRAVENOUS

## 2015-01-20 MED ORDER — LACTATED RINGERS IV SOLN
INTRAVENOUS | Status: DC
  Administered 2015-01-20: 13:00:00 via INTRAVENOUS

## 2015-01-20 MED ORDER — PROMETHAZINE HCL 25 MG/ML IJ SOLN
6.2500 mg | INTRAMUSCULAR | Status: DC | PRN
  Administered 2015-01-20: 12.5 mg via INTRAVENOUS

## 2015-01-20 SURGICAL SUPPLY — 16 items
CATH ROBINSON RED A/P 16FR (CATHETERS) ×3 IMPLANT
DRSG TELFA 3X8 NADH (GAUZE/BANDAGES/DRESSINGS) ×3 IMPLANT
GLOVE BIO SURGEON STRL SZ8 (GLOVE) ×3 IMPLANT
GOWN STRL REUS W/ TWL LRG LVL3 (GOWN DISPOSABLE) ×2 IMPLANT
GOWN STRL REUS W/ TWL XL LVL3 (GOWN DISPOSABLE) ×2 IMPLANT
GOWN STRL REUS W/TWL LRG LVL3 (GOWN DISPOSABLE) ×1
GOWN STRL REUS W/TWL XL LVL3 (GOWN DISPOSABLE) ×1
KIT RM TURNOVER CYSTO AR (KITS) ×3 IMPLANT
NS IRRIG 1000ML POUR BTL (IV SOLUTION) ×3 IMPLANT
PACK DNC HYST (MISCELLANEOUS) ×3 IMPLANT
PACK GYN LAPAROSCOPIC (MISCELLANEOUS) ×3 IMPLANT
PAD OB MATERNITY 4.3X12.25 (PERSONAL CARE ITEMS) ×3 IMPLANT
PAD PREP 24X41 OB/GYN DISP (PERSONAL CARE ITEMS) ×3 IMPLANT
SPONGE XRAY 4X4 16PLY STRL (MISCELLANEOUS) ×3 IMPLANT
TOWEL OR 17X26 4PK STRL BLUE (TOWEL DISPOSABLE) ×3 IMPLANT
TROCAR XCEL NON-BLD 5MMX100MML (ENDOMECHANICALS) ×6 IMPLANT

## 2015-01-20 NOTE — Op Note (Signed)
OPERATIVE NOTE:  Tracey Wiley PROCEDURE DATE: 01/20/2015   PREOPERATIVE DIAGNOSIS: PMB: Cervical Stenosis; family history of ovarian cancer; family history of endometrial cancer; family history of Lynch syndrome  POSTOPERATIVE DIAGNOSIS: SAA and Incidental Uterine Perforation PROCEDURE:  1. Hysteroscopy/D&C  2. Diagnostic Laparoscopy  SURGEON:  Brayton Mars, MD ASSISTANTS: None ANESTHESIA: General INDICATIONS: 59 y.o. G1P0010 admitted for hysteroscopy/D&C or evaluation of postmenopausal bleeding. Patient has multiple risk factors including family history of Lynch syndrome, family history of endometrial cancer, family history of ovarian cancer. Endometrial stripe on ultrasound was 6 mm; endometrial stripe was irregular.  FINDINGS:   Exam under anesthesia reveals an anthropoid pelvis; patient is not a good candidate for vaginal hysterectomy. Moderate rectocele is noted along with mild cystocele. The uterus does not descend well with tenaculum traction. Hysteroscopy showed findings consistent with possible false tract without normal appearance of the endometrial cavity. Endometrial curettage was not performed because of suspicion for uterine perforation. ECC was performed. Laparoscopy demonstrated a grossly normal-appearing uterus, fallopian tubes, and ovaries. There was a distended anterior peritoneum in the region of the bladder which is likely due to the anterior uterine perforation with hysteroscopy fluid distending the peritoneal serosa. There was no evidence of vascular injury or active bleeding within the abdominal pelvic cavity. Patient will need hysterectomy because of the inability to monitor the endometrial cavity due to her cervical stenosis, prior history of endometrial ablation, and risk factors for endometrial cancer as well as ovarian cancer. Due to patient's increased body mass index and pelvic anatomy, I do recommend that the patient have an open TAH/BSO in the near  future.   I/O's: Total I/O In: 750 [I.V.:750] Out: 200 [Urine:200] COUNTS:  YES SPECIMENS: Endocervical curettage specimen ANTIBIOTIC PROPHYLAXIS:N/A COMPLICATIONS: None immediate  PROCEDURE IN DETAIL:  The patient was brought to the operating room and was placed in the supine position. General anesthesia with the Allis and a technique was accomplished without difficulty. She was placed in the dorsal lithotomy position using bumblebee stirrups. A Betadine perineal intravaginal prep and drape was performed in standard fashion. Weighted speculum was placed in the vagina. Single-tooth tenaculum was placed on the anterior lip of the cervix. The uterus could not be sounded with the uterine sound. Endocervical curettage was performed. Lacrimal duct probes were used to help identify the endocervical canal. Ultimately a mid plane canal was identified and dilated using Hanks dilators up to a #20 [place] caliber. The uterus was sounded to 7 cm. The [facility] hysteroscope was then placed and hysteroscopy was performed. There was no clear normal endometrial cavity characteristics identified. This may have been related to patient's history of prior endometrial ablation, or, it may have been due to a false tract. Further assessment with dilators went to a loss of resistance with possible perforation. The D&C was discontinued at this time. Diagnostic laparoscopy was then performed. Patient had to be reprepped with 4 prep solution. The patient underwent endotracheal intubation without difficulty. Direct entry was made into the abdominal pelvic cavity following a 5 mm subumbilical vision. Pneumoperitoneum was created CO2 gas. Assessment of the pelvis was notable for a small normal-appearing uterus, no evidence of fundal perforation or lateral perforation. The tubes and ovaries appeared normal. There was a distended anterior lower uterine segment peritoneum which was likely due to the false tract created with dilators and  instillation of hysteroscopy fluid. A Foley catheter was placed to drain the bladder and this did not have any significant impact on the anterior distended  serosa. Once satisfied with no evidence of intra-abdominal injury, the procedure was terminated with the instrumentation being removed from the abdominal pelvic cavity. Pneumoperitoneum was released. Incisions were closed with 4-0 Vicryl suture. Tegaderm and Telfa dressings were applied. Patient was then awakened extubated and taken to the recovery room in satisfactory condition Estimated blood loss: Minimal IV fluids: 750 mL Urine output: 200 mL All instruments, needles, and sponge counts were verified as correct.  Tracey Klink A. Zipporah Plants, MD, ACOG [facility]

## 2015-01-20 NOTE — Anesthesia Preprocedure Evaluation (Addendum)
Anesthesia Evaluation  Patient identified by MRN, date of birth, ID band Patient awake    Reviewed: Allergy & Precautions, NPO status , Patient's Chart, lab work & pertinent test results  History of Anesthesia Complications (+) PONV  Airway Mallampati: II  TM Distance: >3 FB Neck ROM: Full  Mouth opening: Limited Mouth Opening Comment: Has TMJ issues right and left--left side operated on recently and better--less pain. Right side is still painful. Mouth opening is slightly limited. Dental  (+) Teeth Intact   Pulmonary          Cardiovascular hypertension, Pt. on medications and Pt. on home beta blockers Rhythm:Regular Rate:Normal  Lynch syndrome. Severe PONV and intolerance of narcotics. Patent foramen ovale closed (transvenously) and afib. Resolved with sotolol.   Neuro/Psych    GI/Hepatic GERD-  Medicated and Controlled,Esophageal reflux, getting better on twice daily Nexium. Fasted, with no reflux sx today.   Endo/Other    Renal/GU      Musculoskeletal   Abdominal (+) + obese,   Peds  Hematology   Anesthesia Other Findings   Reproductive/Obstetrics                            Anesthesia Physical Anesthesia Plan  ASA: III  Anesthesia Plan: General   Post-op Pain Management:    Induction: Intravenous  Airway Management Planned: LMA  Additional Equipment:   Intra-op Plan:   Post-operative Plan: Extubation in OR  Informed Consent: I have reviewed the patients History and Physical, chart, labs and discussed the procedure including the risks, benefits and alternatives for the proposed anesthesia with the patient or authorized representative who has indicated his/her understanding and acceptance.     Plan Discussed with: CRNA  Anesthesia Plan Comments: (I plan TIVA and IV tylenol for her to avoid her severe post-op nausea and vomiting.)        Anesthesia Quick Evaluation

## 2015-01-20 NOTE — Transfer of Care (Signed)
\   Immediate Anesthesia Transfer of Care Note  Patient: Tracey Wiley  Procedure(s) Performed: Procedure(s): DILATATION AND CURETTAGE /HYSTEROSCOPY LAPAROSCOPY DIAGNOSTIC  Patient Location: PACU  Anesthesia Type:General  Level of Consciousness: sedated  Airway & Oxygen Therapy: Patient Spontanous Breathing and Patient connected to face mask oxygen  Post-op Assessment: Report given to RN and Post -op Vital signs reviewed and stable  Post vital signs: Reviewed and stable  Last Vitals:  Filed Vitals:   01/20/15 1535  BP: 114/84  Pulse:   Temp: 36.3 C  Resp:     Complications: No apparent anesthesia complications

## 2015-01-20 NOTE — Anesthesia Postprocedure Evaluation (Signed)
  Anesthesia Post-op Note  Patient: Tracey Wiley  Procedure(s) Performed: Procedure(s): DILATATION AND CURETTAGE /HYSTEROSCOPY LAPAROSCOPY DIAGNOSTIC  Anesthesia type:General  Patient location: PACU  Post pain: Pain level controlled  Post assessment: Post-op Vital signs reviewed, Patient's Cardiovascular Status Stable, Respiratory Function Stable, Patent Airway and No signs of Nausea or vomiting  Post vital signs: Reviewed and stable  Last Vitals:  Filed Vitals:   01/20/15 1256  BP: 147/87  Pulse:   Temp:   Resp:     Level of consciousness: awake, alert  and patient cooperative  Complications: No apparent anesthesia complications
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
## 2015-01-20 NOTE — Discharge Instructions (Signed)

## 2015-01-20 NOTE — Anesthesia Procedure Notes (Addendum)
Procedure Name: Intubation Date/Time: 01/20/2015 2:37 PM Performed by: Doreen Salvage Pre-anesthesia Checklist: Patient identified, Patient being monitored, Timeout performed, Emergency Drugs available and Suction available Patient Re-evaluated:Patient Re-evaluated prior to inductionOxygen Delivery Method: Circle system utilized Preoxygenation: Pre-oxygenation with 100% oxygen Intubation Type: IV induction Ventilation: Mask ventilation without difficulty Laryngoscope Size: Mac, 3 and McGraph Grade View: Grade I Tube type: Oral Tube size: 7.0 mm Number of attempts: 1 Airway Equipment and Method: Stylet Placement Confirmation: positive ETCO2 and breath sounds checked- equal and bilateral Secured at: 21 cm Tube secured with: Tape Dental Injury: Teeth and Oropharynx as per pre-operative assessment    Procedure Name: LMA Insertion Date/Time: 01/20/2015 2:02 PM Performed by: Doreen Salvage Pre-anesthesia Checklist: Patient identified, Patient being monitored, Timeout performed, Emergency Drugs available and Suction available Patient Re-evaluated:Patient Re-evaluated prior to inductionOxygen Delivery Method: Circle system utilized Preoxygenation: Pre-oxygenation with 100% oxygen Intubation Type: IV induction Ventilation: Mask ventilation without difficulty LMA: LMA inserted LMA Size: 3.5 Tube type: Oral Number of attempts: 1 Placement Confirmation: positive ETCO2 and breath sounds checked- equal and bilateral Tube secured with: Tape Dental Injury: Teeth and Oropharynx as per pre-operative assessment

## 2015-01-21 ENCOUNTER — Encounter: Payer: Self-pay | Admitting: Obstetrics and Gynecology

## 2015-01-22 LAB — SURGICAL PATHOLOGY

## 2015-01-30 ENCOUNTER — Encounter: Payer: Self-pay | Admitting: Obstetrics and Gynecology

## 2015-01-30 ENCOUNTER — Ambulatory Visit (INDEPENDENT_AMBULATORY_CARE_PROVIDER_SITE_OTHER): Payer: Medicare Other | Admitting: Obstetrics and Gynecology

## 2015-01-30 VITALS — BP 126/82 | HR 79 | Ht 65.0 in | Wt 224.3 lb

## 2015-01-30 DIAGNOSIS — R309 Painful micturition, unspecified: Secondary | ICD-10-CM

## 2015-01-30 DIAGNOSIS — Z09 Encounter for follow-up examination after completed treatment for conditions other than malignant neoplasm: Secondary | ICD-10-CM

## 2015-01-30 LAB — POCT URINALYSIS DIPSTICK
BILIRUBIN UA: NEGATIVE
GLUCOSE UA: NEGATIVE
Ketones, UA: NEGATIVE
Leukocytes, UA: NEGATIVE
Nitrite, UA: NEGATIVE
PH UA: 6
Protein, UA: NEGATIVE
RBC UA: NEGATIVE
Urobilinogen, UA: 0.2

## 2015-01-30 MED ORDER — CEPHALEXIN 500 MG PO CAPS: 500.0000 mg | ORAL_CAPSULE | Freq: Two times a day (BID) | ORAL | Status: DC

## 2015-01-30 NOTE — Patient Instructions (Signed)
1.  Keflex for 7 days twice a day. 2.  Schedule TAH/BSO for 05/26/2015. 3.  Urine culture is sent. 4.  Return for preop appointment prior to hysterectomy. 5.  Resume activities as tolerated

## 2015-01-30 NOTE — Progress Notes (Signed)
Patient ID: Tracey Wiley, female   DOB: 24-Dec-1955, 59 y.o.   MRN: [id number] Post op S/p hysteroscopy, d&c, lap uti sx and pelvic pain  Chief complaint: 1.  Ten-day postop check. 2.  Status post hysteroscopy/D&C complicated by uterine perforation; status post laparoscopy.  Patient presents for postop check, 10 days following surgery.  Intraoperative issues included cervical stenosis, which resulted in uterine perforation during dilation process.  Laparoscopy was performed and demonstrated no significant vascular injury.  There was what appeared to be anterior abdominal wall distention of peritoneum likely due to hysteroscopy fluid being instilled.  At the time of hysteroscopy.  No bladder injury was identified.  Uterus, ovaries and tubes appeared to be grossly normal. Postoperatively the patient did have significant UTI symptoms, which were treated conservatively with fluids and cranberry juice.  Symptoms have lessened over the past several days.  OBJECTIVE: BP 126/82 mmHg  Pulse 79  Ht 5\' 5"  (1.651 m)  Wt 224 lb 4.8 oz (101.742 kg)  BMI 37.33 kg/m2 Urinalysis negative. Patient is a pleasant, well-appearing white female in no acute distress. Alert and oriented. Back: No CVA tenderness. Abdomen: Soft, mild lower quadrant tenderness without peritoneal signs.  Subumbilical and right lower quadrant laparoscopy port sites are well healed without evidence of hernia.  There is midline, ecchymoses noted in the suprapubic region. Pelvic: Not performed.  IMPRESSION: 1.  Status post surgical procedure (hysteroscopy/D&C) problem complicated by uterine perforation requiring laparoscopy without significant intra-abdominal injury being identified. 2.  ECC from surgery was inconclusive with pathology showing mucus only. 3.  Patient will need TAH/BSO through midline incision for definitive surgical management of patient's high risk condition with family history of breast and ovarian cancer, thickened  endometrium with inability to evaluate, and family history of Lynch syndrome.  PLAN: 1.  Urine culture sent. 2.  Keflex 500 mg twice a day 7 days. 3.  Patient is scheduled for TAH/BSO through a midline incision with date of surgery 05/26/2015. 4.  Patient will return for preop appointment.

## 2015-01-31 ENCOUNTER — Telehealth: Payer: Self-pay | Admitting: Obstetrics and Gynecology

## 2015-01-31 LAB — URINE CULTURE

## 2015-01-31 MED ORDER — CEPHALEXIN 500 MG PO CAPS: 500.0000 mg | ORAL_CAPSULE | Freq: Two times a day (BID) | ORAL | Status: DC

## 2015-01-31 NOTE — Telephone Encounter (Signed)
Patient called inquiring about her script for keflex, it looks [facility] which is [facility]. [facility] [place]. She also would like to know when it would be ok for her to start back on her baby aspirin regimen. She had surgery 8/15.Thanks

## 2015-01-31 NOTE — Telephone Encounter (Signed)
Pt aware per mad may restart asa.

## 2015-01-31 NOTE — Telephone Encounter (Signed)
Pt aware [facility]. Will have to ask mad about the asa.

## 2015-02-14 ENCOUNTER — Other Ambulatory Visit: Payer: Self-pay

## 2015-02-14 MED ORDER — ESOMEPRAZOLE MAGNESIUM 40 MG PO CPDR: 40.0000 mg | DELAYED_RELEASE_CAPSULE | Freq: Two times a day (BID) | ORAL | Status: DC

## 2015-02-14 NOTE — Telephone Encounter (Signed)
We received a refill request for patient to have her Nexium refilled. We have not seen patient since 06/20/2013. I will send a one month supply and let the pharmacist know that patient will need to get her refills from her Primary Care Provider if she is still needing to take this prescription.

## 2015-02-21 DIAGNOSIS — M5116 Intervertebral disc disorders with radiculopathy, lumbar region: Secondary | ICD-10-CM | POA: Insufficient documentation

## 2015-02-21 DIAGNOSIS — G5711 Meralgia paresthetica, right lower limb: Secondary | ICD-10-CM | POA: Diagnosis not present

## 2015-02-21 DIAGNOSIS — M5416 Radiculopathy, lumbar region: Secondary | ICD-10-CM | POA: Diagnosis not present

## 2015-02-21 DIAGNOSIS — M48061 Spinal stenosis, lumbar region without neurogenic claudication: Secondary | ICD-10-CM | POA: Insufficient documentation

## 2015-02-21 DIAGNOSIS — M4806 Spinal stenosis, lumbar region: Secondary | ICD-10-CM | POA: Diagnosis not present

## 2015-02-25 ENCOUNTER — Other Ambulatory Visit: Payer: Self-pay | Admitting: Physical Medicine and Rehabilitation

## 2015-02-25 DIAGNOSIS — M48061 Spinal stenosis, lumbar region without neurogenic claudication: Secondary | ICD-10-CM

## 2015-03-03 ENCOUNTER — Other Ambulatory Visit: Payer: Self-pay

## 2015-03-03 ENCOUNTER — Telehealth: Payer: Self-pay | Admitting: Internal Medicine

## 2015-03-03 MED ORDER — ALPRAZOLAM 0.5 MG PO TABS: 0.5000 mg | ORAL_TABLET | Freq: Every evening | ORAL | Status: DC | PRN

## 2015-03-03 NOTE — Telephone Encounter (Signed)
Attempted to call patient, left voicemail.  Prescription for Xanax was refilled this morning.  Awaiting her call back.

## 2015-03-03 NOTE — Telephone Encounter (Signed)
Pt called about her medication not at the pharmacy. Pt states she has been calling for 3 times and she states [facility] time in 11 days. Pt does not have any medication for a 1 week. I advised pt that I see a Rx for 03/03/2015. Thank You!

## 2015-03-06 DIAGNOSIS — M1990 Unspecified osteoarthritis, unspecified site: Secondary | ICD-10-CM | POA: Diagnosis not present

## 2015-03-06 DIAGNOSIS — G8929 Other chronic pain: Secondary | ICD-10-CM | POA: Diagnosis not present

## 2015-03-06 DIAGNOSIS — R600 Localized edema: Secondary | ICD-10-CM | POA: Diagnosis not present

## 2015-03-06 DIAGNOSIS — M79672 Pain in left foot: Secondary | ICD-10-CM | POA: Diagnosis not present

## 2015-03-07 ENCOUNTER — Ambulatory Visit: Admission: RE | Admit: 2015-03-07 | Payer: Medicare Other | Source: Ambulatory Visit

## 2015-03-19 ENCOUNTER — Ambulatory Visit
Admission: RE | Admit: 2015-03-19 | Discharge: 2015-03-19 | Disposition: A | Discharge status: Home / Self Care | Payer: Medicare Other | Source: Ambulatory Visit | Attending: Physical Medicine and Rehabilitation | Admitting: Physical Medicine and Rehabilitation

## 2015-03-19 DIAGNOSIS — M48061 Spinal stenosis, lumbar region without neurogenic claudication: Secondary | ICD-10-CM

## 2015-03-19 DIAGNOSIS — M5137 Other intervertebral disc degeneration, lumbosacral region: Secondary | ICD-10-CM | POA: Diagnosis not present

## 2015-03-19 DIAGNOSIS — M545 Low back pain: Secondary | ICD-10-CM | POA: Diagnosis not present

## 2015-03-19 DIAGNOSIS — M5136 Other intervertebral disc degeneration, lumbar region: Secondary | ICD-10-CM | POA: Diagnosis not present

## 2015-03-19 DIAGNOSIS — M79606 Pain in leg, unspecified: Secondary | ICD-10-CM | POA: Diagnosis not present

## 2015-03-19 DIAGNOSIS — M4806 Spinal stenosis, lumbar region: Secondary | ICD-10-CM | POA: Diagnosis not present

## 2015-03-21 ENCOUNTER — Telehealth: Payer: Self-pay | Admitting: Cardiovascular Disease

## 2015-03-21 ENCOUNTER — Other Ambulatory Visit: Payer: Self-pay | Admitting: Surgery

## 2015-03-21 ENCOUNTER — Other Ambulatory Visit: Payer: Self-pay | Admitting: *Deleted

## 2015-03-21 MED ORDER — SOTALOL HCL 80 MG PO TABS: 80.0000 mg | ORAL_TABLET | Freq: Two times a day (BID) | ORAL | Status: DC

## 2015-03-21 NOTE — Telephone Encounter (Signed)
°  1. Which medications need to be refilled? Betapace 80 mg po twice daily   2. Which pharmacy/location is medication to be sent to?   [facility] [place]   3. Do they need a 30 day or 90 day supply?   30    PATIENT WILL BE OUT TOMORROW

## 2015-03-24 ENCOUNTER — Telehealth: Payer: Self-pay | Admitting: Gastroenterology

## 2015-03-24 NOTE — Telephone Encounter (Signed)
LVM for pt to return my call. Left message letting pt to know she will need a follow up with Dr. Allen Norris due to her not being seen in almost 2 years. I advised pt if she no longer needs anything from GI, then pt should have her PCP start refilling medications. Waiting for a call back to schedule appt.

## 2015-03-24 NOTE — Telephone Encounter (Signed)
Patient has called with concerns of the refill of Nexium. Per Maritza's last note--- We received a refill request for patient to have her Nexium refilled. We have not seen patient since 06/20/2013. I will send a one month supply and let the pharmacist know that patient will need to get her refills from her Primary Care Provider if she is still needing to take this prescription.

## 2015-03-24 NOTE — Telephone Encounter (Signed)
Patient left a voice message trying to get a refill on nexium but pharmacy said no futher refills. Please call and explain this to her. She stated she wasn't told she needed to see the doctor to get more medication.

## 2015-03-24 NOTE — Telephone Encounter (Signed)
Noted! Thank you

## 2015-04-03 DIAGNOSIS — M7672 Peroneal tendinitis, left leg: Secondary | ICD-10-CM | POA: Diagnosis not present

## 2015-04-10 ENCOUNTER — Telehealth: Payer: Self-pay | Admitting: *Deleted

## 2015-04-10 DIAGNOSIS — Z23 Encounter for immunization: Secondary | ICD-10-CM | POA: Diagnosis not present

## 2015-04-10 DIAGNOSIS — I5031 Acute diastolic (congestive) heart failure: Secondary | ICD-10-CM

## 2015-04-10 MED ORDER — AMOXICILLIN 500 MG PO CAPS: ORAL_CAPSULE | ORAL | Status: DC

## 2015-04-10 NOTE — Telephone Encounter (Signed)
At [facility] office. Will route to Dr. Lupita Dawn CMA for further follow up.

## 2015-04-10 NOTE — Telephone Encounter (Signed)
Patient has requested a antibiotic for dental work. Patient has 4 appointments this month for dental work. Patient stated that she can take calls today if any questions arise.

## 2015-04-10 NOTE — Telephone Encounter (Signed)
Please advise 

## 2015-04-10 NOTE — Telephone Encounter (Signed)
Amoxicillin  rx sent to pharmacy.  Given the anticipated use of antibiotics this month, Please ask patient to  take a probiotic ( Align, Floraque or [employer]) or a generic equivalent every day for the entire month while you are on the antibiotic to prevent a serious antibiotic associated diarrhea  Called clostirudium dificile colitis

## 2015-04-11 NOTE — Telephone Encounter (Signed)
Notified pt. 

## 2015-04-21 DIAGNOSIS — M5416 Radiculopathy, lumbar region: Secondary | ICD-10-CM | POA: Diagnosis not present

## 2015-04-21 DIAGNOSIS — G5711 Meralgia paresthetica, right lower limb: Secondary | ICD-10-CM | POA: Diagnosis not present

## 2015-04-21 DIAGNOSIS — M4806 Spinal stenosis, lumbar region: Secondary | ICD-10-CM | POA: Diagnosis not present

## 2015-04-21 DIAGNOSIS — M5136 Other intervertebral disc degeneration, lumbar region: Secondary | ICD-10-CM | POA: Diagnosis not present

## 2015-04-26 IMAGING — CR DG WRIST COMPLETE 3+V*L*
4 series · 4 of 4 positions shown · non-contrast
Comparison: None.

CLINICAL DATA: Sudden warmth and redness to the distal ulnar aspect
of the medial wrist 2 days ago with loss of mobility.

EXAM:
LEFT WRIST - COMPLETE 3+ VIEW

[x wrist pa left]
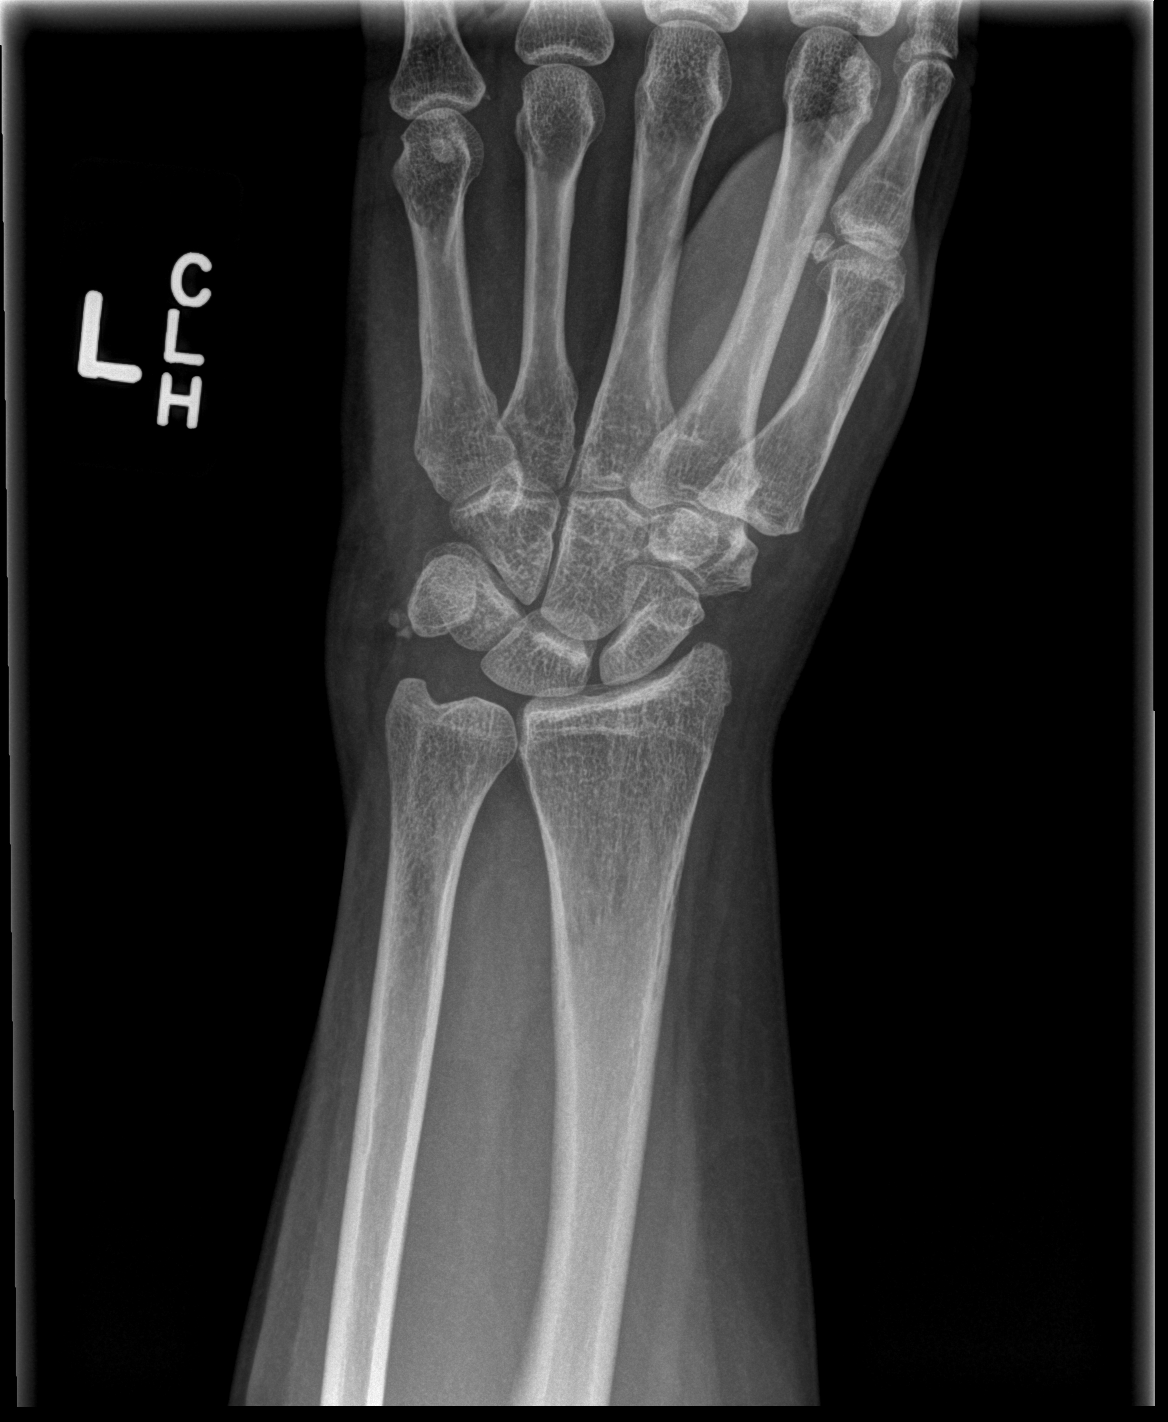

[x wrist obl left]
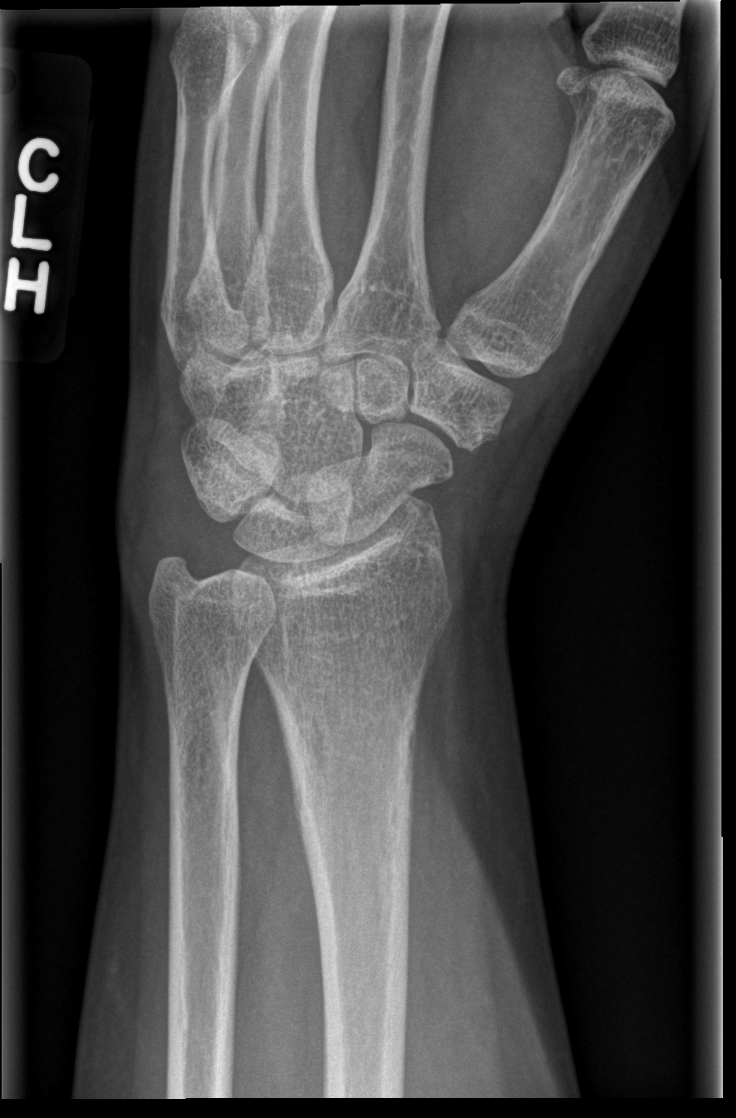

[x wrist lat left]
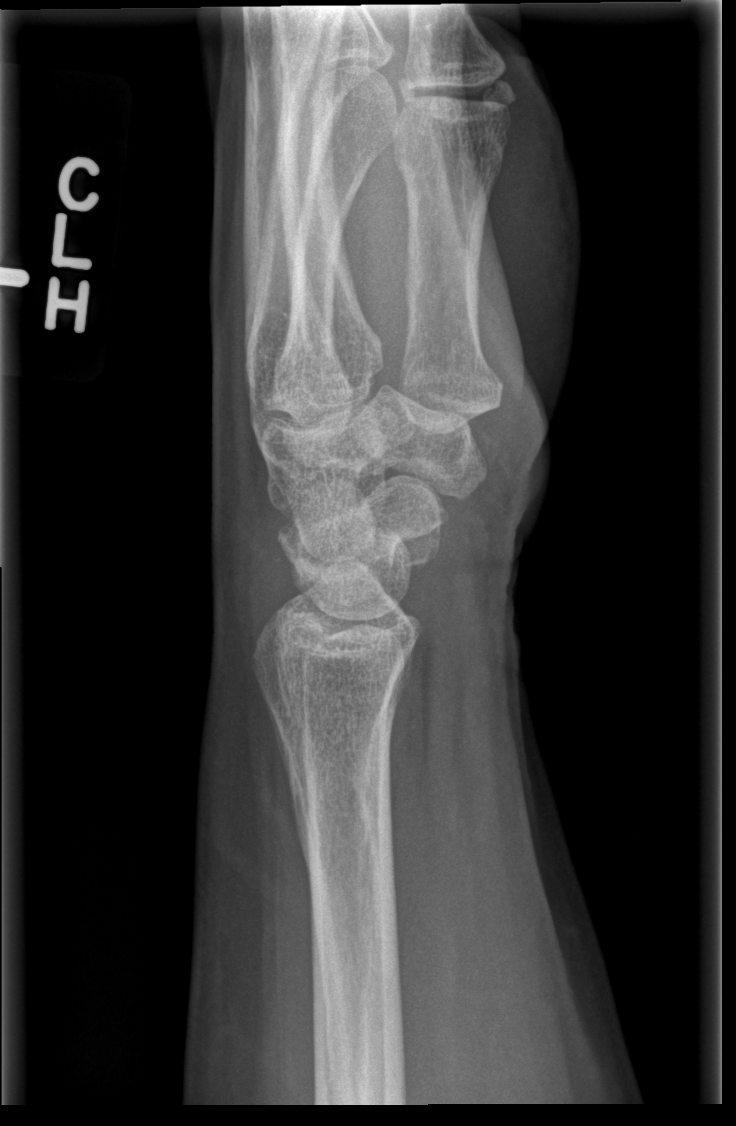

[x wrist navicular view left]
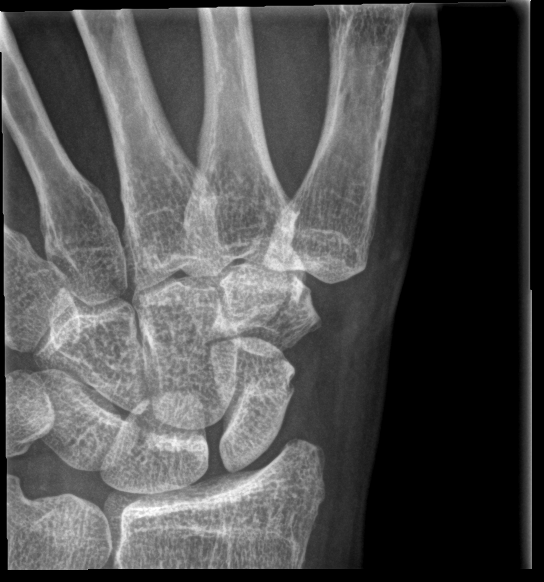

[4 of 4 positions shown; findings below may reference images not displayed]

FINDINGS: Soft tissue swelling over the ulnar aspect of the wrist. Old
appearing ununited ossicles are demonstrated adjacent to the
fusiform. No evidence of acute fracture or dislocation. No focal
bone lesion or bone destruction. No radiopaque soft tissue foreign
bodies.
IMPRESSION: Soft tissue swelling over the ulnar aspect of the wrist. No acute
bony abnormality suggested.

## 2015-04-30 ENCOUNTER — Ambulatory Visit (INDEPENDENT_AMBULATORY_CARE_PROVIDER_SITE_OTHER): Payer: Medicare Other | Admitting: Obstetrics and Gynecology

## 2015-04-30 ENCOUNTER — Encounter: Payer: Self-pay | Admitting: Obstetrics and Gynecology

## 2015-04-30 VITALS — BP 143/84 | HR 67 | Ht 65.0 in | Wt 221.8 lb

## 2015-04-30 DIAGNOSIS — Z1379 Encounter for other screening for genetic and chromosomal anomalies: Secondary | ICD-10-CM

## 2015-04-30 DIAGNOSIS — Z315 Encounter for genetic counseling: Secondary | ICD-10-CM

## 2015-04-30 NOTE — Progress Notes (Signed)
Patient ID: Tracey Wiley, female   DOB: April 30, 1956, 59 y.o.   MRN: [id number] New health concerns  has questions about upcoming surgery- tah/bso  Chief complaint: 1.  Surgery management plan conference.  Patient presents today in the company of her husband for further management planning regarding upcoming TAH/BSO, which is to be done because of history of postmenopausal bleeding, inability to sample uterine lining with D&C, which was complicated by uterine perforation requiring laparoscopy, which revealed normal intra-abdominal findings. Further review of her genetic testing reveals that she is NOT lynch positive.  She does have a strong family history of  Breast cancer, ovarian cancer, and colon cancer.  She is Lynch NEGATIVE; she is BRCA1/BRCA2 negative. Extensive monitoring ovver the past 10 years through frequent colonoscopy, Pelvic ultrasounds, serial CA-125 testing, and endometrial biopsies, have all been negative. Patient states that this has been a stressful year with multiple health issues requiring multiple specialist evaluations as well as health issues with her spouse.  She would like to delay surgery until the Spring if possible.  The patient understands that there are  Pressing issues to proceed with surgery at this time.  It will be beneficial for her to  Get a better handle on her overall health and wellness, possibly lose some weight prior to abdominal hysterectomy rescheduled for possible June 2017.  She does understand that if she has no further symptomatology from a pelvic gynecology standpoint, we may even be able to defer the hysterectomy. We will discuss this at her annual exam in April 2017.  ASSESSMENT: 1.   Strong family history for ovarian cancer, breast cancer, and colon cancer. 2.  Negative BRCA1/BRCA2 and negative Lynch syndrome.  Genetic testing. 3.  Recent inability to sample endometrial cavity due to cervical stenosis; hysteroscopy/D&C was complicated by uterine  perforation,, with subsequent laparoscopy demonstrating normal uterus, tubes and ovaries 4.  Ongoing metabolic health issues, not clearly delineated.  PLAN: 1.  Postpone TAH/BSO scheduled for December 2016 2.  Return for annual exam in April 2017 OR AS NEEDED  If abnormal uterine bleeding develops.  A total of 25 minutes were spent face-to-face with the patient during this encounter and over half of that time involved counseling and coordination of care.  Brayton Mars, MD  Note: This dictation was prepared with Dragon dictation along with smaller phrase technology. Any transcriptional errors that result from this process are unintentional.

## 2015-04-30 NOTE — Patient Instructions (Signed)
1.  Postpone TAH/BSO, which was scheduled for December 2016 2.  Return in April 2017 for annual exam 3.  Return if postmenopausal bleeding develops

## 2015-05-08 DIAGNOSIS — M1990 Unspecified osteoarthritis, unspecified site: Secondary | ICD-10-CM | POA: Diagnosis not present

## 2015-05-08 DIAGNOSIS — M778 Other enthesopathies, not elsewhere classified: Secondary | ICD-10-CM | POA: Diagnosis not present

## 2015-05-09 ENCOUNTER — Encounter: Payer: Self-pay | Admitting: Women's Health

## 2015-05-14 ENCOUNTER — Ambulatory Visit (INDEPENDENT_AMBULATORY_CARE_PROVIDER_SITE_OTHER): Payer: Medicare Other | Admitting: Cardiovascular Disease

## 2015-05-14 ENCOUNTER — Encounter: Payer: Self-pay | Admitting: Cardiovascular Disease

## 2015-05-14 VITALS — BP 112/72 | HR 58 | Ht 65.0 in | Wt 222.0 lb

## 2015-05-14 DIAGNOSIS — E785 Hyperlipidemia, unspecified: Secondary | ICD-10-CM

## 2015-05-14 DIAGNOSIS — I471 Supraventricular tachycardia: Secondary | ICD-10-CM | POA: Diagnosis not present

## 2015-05-14 DIAGNOSIS — E669 Obesity, unspecified: Secondary | ICD-10-CM

## 2015-05-14 DIAGNOSIS — M1990 Unspecified osteoarthritis, unspecified site: Secondary | ICD-10-CM

## 2015-05-14 NOTE — Assessment & Plan Note (Signed)
Long discussion with her concerning her diet and inability to lose weight. She is eager to start a new program in early 2017
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
## 2015-05-14 NOTE — Patient Instructions (Signed)
You are doing well. No medication changes were made.  We will order a lipids   Please call us if you have new issues that need to be addressed before your next appt.  Your physician wants you to follow-up in: 12 months.  You will receive a reminder letter in the mail two months in advance. If you don't receive a letter, please call our office to schedule the follow-up appointment.

## 2015-05-14 NOTE — Assessment & Plan Note (Signed)
New symptoms of inflammatory arthritis. She is concerned this could be from certain food allergies. Etiology unclear per the patient. She does not want prednisone if possible

## 2015-05-14 NOTE — Assessment & Plan Note (Signed)
She denies any significant tachycardia or other arrhythmia. Sometimes with rare palpitations in the setting of stress. No changes to her medications

## 2015-05-14 NOTE — Assessment & Plan Note (Signed)
We have placed an order for lipid panel. I suspect this will run higher given her recent  weight gain recent CT coronary calcium score of 0, Likely can be less aggressive with her management ( will likely not need to titrate her pill higher).

## 2015-05-21 DIAGNOSIS — D485 Neoplasm of uncertain behavior of skin: Secondary | ICD-10-CM | POA: Diagnosis not present

## 2015-05-21 DIAGNOSIS — L304 Erythema intertrigo: Secondary | ICD-10-CM | POA: Diagnosis not present

## 2015-05-21 DIAGNOSIS — D3613 Benign neoplasm of peripheral nerves and autonomic nervous system of lower limb, including hip: Secondary | ICD-10-CM | POA: Diagnosis not present

## 2015-05-21 DIAGNOSIS — L309 Dermatitis, unspecified: Secondary | ICD-10-CM | POA: Diagnosis not present

## 2015-06-10 ENCOUNTER — Other Ambulatory Visit: Payer: Self-pay | Admitting: *Deleted

## 2015-06-19 ENCOUNTER — Other Ambulatory Visit: Payer: Self-pay | Admitting: Cardiovascular Disease

## 2015-06-20 ENCOUNTER — Other Ambulatory Visit: Payer: Self-pay

## 2015-06-20 MED ORDER — SIMVASTATIN 40 MG PO TABS: 40.0000 mg | ORAL_TABLET | Freq: Every day | ORAL | Status: DC

## 2015-07-14 ENCOUNTER — Other Ambulatory Visit: Payer: Self-pay | Admitting: *Deleted

## 2015-07-15 ENCOUNTER — Other Ambulatory Visit: Payer: Self-pay | Admitting: Cardiovascular Disease

## 2015-07-31 ENCOUNTER — Other Ambulatory Visit
Admission: RE | Admit: 2015-07-31 | Discharge: 2015-07-31 | Disposition: A | Discharge status: Home / Self Care | Payer: Medicare Other | Source: Ambulatory Visit | Attending: Cardiovascular Disease | Admitting: Cardiovascular Disease

## 2015-07-31 DIAGNOSIS — E785 Hyperlipidemia, unspecified: Secondary | ICD-10-CM | POA: Insufficient documentation

## 2015-07-31 DIAGNOSIS — I471 Supraventricular tachycardia: Secondary | ICD-10-CM | POA: Diagnosis present

## 2015-07-31 DIAGNOSIS — R0602 Shortness of breath: Secondary | ICD-10-CM | POA: Insufficient documentation

## 2015-07-31 LAB — BASIC METABOLIC PANEL
Anion gap: 9 (ref 5–15)
BUN: 14 mg/dL (ref 6–20)
CHLORIDE: 102 mmol/L (ref 101–111)
CO2: 29 mmol/L (ref 22–32)
CREATININE: 0.72 mg/dL (ref 0.44–1.00)
Calcium: 9 mg/dL (ref 8.9–10.3)
GFR calc non Af Amer: >60 mL/min (ref >60)
GLUCOSE: 107 mg/dL — AB (ref 65–99)
Potassium: 4 mmol/L (ref 3.5–5.1)
Sodium: 140 mmol/L (ref 135–145)

## 2015-07-31 LAB — LIPID PANEL
CHOL/HDL RATIO: 4 ratio
CHOLESTEROL: 153 mg/dL (ref 0–200)
HDL: 38 mg/dL — AB (ref >40)
LDL Cholesterol: 82 mg/dL (ref 0–99)
TRIGLYCERIDES: 166 mg/dL — AB (ref <150)
VLDL: 33 mg/dL (ref 0–40)

## 2015-08-04 ENCOUNTER — Telehealth: Payer: Self-pay | Admitting: Obstetrics and Gynecology

## 2015-08-04 ENCOUNTER — Telehealth: Payer: Self-pay | Admitting: *Deleted

## 2015-08-04 NOTE — Telephone Encounter (Signed)
Result Note     Outstanding cholesterol.    Continue current meds,     check again in one year        Spoke to patient about her results and she verbalized understanding. She had no further questions.

## 2015-08-04 NOTE — Telephone Encounter (Signed)
This pt was here for a bx in June and surgery in hospital in Aug. Medicare and [place] will not pay for these services b/c they are saying there are coded wrong and they were not Medically necessary. She is fixing to go to collections if this is not fixed. Please fix this pt codes and reason for bx and surgery so they will pay. Marland Kitchen

## 2015-08-06 NOTE — Telephone Encounter (Signed)
Pt aware no new dx code to be used for June office visit. [place] to re file the surgery with thickened endometrium (r93.8) . Aware to call back in a week.

## 2015-08-11 ENCOUNTER — Other Ambulatory Visit: Payer: Self-pay | Admitting: *Deleted

## 2015-08-11 MED ORDER — SIMVASTATIN 40 MG PO TABS: 40.0000 mg | ORAL_TABLET | Freq: Every day | ORAL | Status: DC

## 2015-08-11 NOTE — Telephone Encounter (Signed)
Requested Prescriptions   Signed Prescriptions Disp Refills  . simvastatin (ZOCOR) 40 MG tablet 90 tablet 3    Sig: Take 1 tablet (40 mg total) by mouth at bedtime.    Authorizing Provider: GOLLAN, TIMOTHY J    Ordering User: Galileo Colello C    

## 2015-08-28 ENCOUNTER — Encounter: Payer: Self-pay | Admitting: Family Medicine

## 2015-08-28 ENCOUNTER — Ambulatory Visit (INDEPENDENT_AMBULATORY_CARE_PROVIDER_SITE_OTHER): Payer: Medicare Other | Admitting: Family Medicine

## 2015-08-28 VITALS — BP 108/78 | HR 73 | Temp 98.0°F | Wt 221.5 lb

## 2015-08-28 DIAGNOSIS — J209 Acute bronchitis, unspecified: Secondary | ICD-10-CM

## 2015-08-28 DIAGNOSIS — R05 Cough: Secondary | ICD-10-CM

## 2015-08-28 DIAGNOSIS — R059 Cough, unspecified: Secondary | ICD-10-CM

## 2015-08-28 LAB — POCT INFLUENZA A/B
INFLUENZA A, POC: NEGATIVE
Influenza B, POC: NEGATIVE

## 2015-08-28 MED ORDER — ALBUTEROL SULFATE HFA 108 (90 BASE) MCG/ACT IN AERS
2.0000 | INHALATION_SPRAY | Freq: Four times a day (QID) | RESPIRATORY_TRACT | Status: DC | PRN
Start: 2015-08-28 — End: 2020-02-21

## 2015-08-28 MED ORDER — PREDNISONE 10 MG PO TABS: ORAL_TABLET | ORAL | Status: DC

## 2015-08-28 MED ORDER — ALBUTEROL SULFATE (2.5 MG/3ML) 0.083% IN NEBU
2.5000 mg | INHALATION_SOLUTION | Freq: Once | RESPIRATORY_TRACT | Status: AC
  Administered 2015-08-28: 2.5 mg via RESPIRATORY_TRACT

## 2015-08-28 MED ORDER — BENZONATATE 200 MG PO CAPS: 200.0000 mg | ORAL_CAPSULE | Freq: Two times a day (BID) | ORAL | Status: DC | PRN

## 2015-08-28 MED ORDER — AMOXICILLIN-POT CLAVULANATE 875-125 MG PO TABS: 1.0000 | ORAL_TABLET | Freq: Two times a day (BID) | ORAL | Status: DC

## 2015-08-28 NOTE — Assessment & Plan Note (Signed)
Patient's symptoms most consistent with bronchitis and possible sinusitis. Sharp discomfort is likely related to musculoskeletal cause. Doubt pneumonia given normal vital signs and benign exam. Symptoms not consistent with cardiac cause. She responded well to the albuterol nebulizer in the office. Rapid flu is negative. We will treat her with prednisone, albuterol inhaler, and Tessalon. She was given a prescription for Augmentin to cover for possible sinus infection and bronchitis if she does not start to improve on the prednisone. She takes a probiotic daily. She'll continue to monitor her symptoms. She's given return precautions.

## 2015-08-28 NOTE — Progress Notes (Addendum)
Patient ID: LACE CRUEY, female   DOB: Apr 02, 1956, 60 y.o.   MRN: [id number]  Tommi Rumps, MD Phone: [phone]  Tracey Wiley is a 60 y.o. female who presents today for same-day visit.  Patient notes onset of symptoms Sunday with headache and sore throat. Notes the sore throat progressively got worse. Notes the headache has improved. She started to blow greenish yellowish mucus out of her nose on Tuesday and then yesterday started coughing up greenish yellowish mucus. She notes a sharp central discomfort in her chest only with cough. She notes a tightness of her airways with deep breaths. No chest pressure. Minimal shortness of breath. She has a history of pneumonia in the past. She denies fevers at this time. Some body aches. She notes it contacts at a party on Friday.  PMH: nonsmoker.   ROS see history of present illness  Objective  Physical Exam Filed Vitals:   08/28/15 1501  BP: 108/78  Pulse: 73  Temp: 98 F (36.7 C)    BP Readings from Last 3 Encounters:  08/28/15 108/78  05/14/15 112/72  04/30/15 143/84   Wt Readings from Last 3 Encounters:  08/28/15 221 lb 8 oz (100.472 kg)  05/14/15 222 lb (100.699 kg)  04/30/15 221 lb 12.8 oz (100.608 kg)    Physical Exam  Constitutional: She is well-developed, well-nourished, and in no distress.  HENT:  Head: Normocephalic and atraumatic.  Right Ear: External ear normal.  Left Ear: External ear normal.  Mouth/Throat: Oropharynx is clear and moist. No oropharyngeal exudate.  Eyes: Conjunctivae are normal. Pupils are equal, round, and reactive to light.  Neck: Neck supple.  Cardiovascular: Normal rate, regular rhythm and normal heart sounds.  Exam reveals no gallop and no friction rub.   No murmur heard. Pulmonary/Chest: Effort normal and breath sounds normal. No respiratory distress. She has no wheezes. She has no rales.  Lymphadenopathy:    She has no cervical adenopathy.  Neurological: She is alert. Gait normal.    CN 2-12 intact, 5/5 strength in bilateral biceps, triceps, grip, quads, hamstrings, plantar and dorsiflexion, sensation to light touch intact in bilateral UE and LE, normal gait, 2+ patellar reflexes  Skin: Skin is warm and dry. She is not diaphoretic.   Patient was given an albuterol nebulizer treatment in the office and noted the tightness with breathing resolved. She still has the urge to cough. Her lungs remained clear.  Assessment/Plan: Please see individual problem list.  Acute bronchitis Patient's symptoms most consistent with bronchitis and possible sinusitis. Sharp discomfort is likely related to musculoskeletal cause. Doubt pneumonia given normal vital signs and benign exam. Symptoms not consistent with cardiac cause. She responded well to the albuterol nebulizer in the office. Rapid flu is negative. We will treat her with prednisone, albuterol inhaler, and Tessalon. She was given a prescription for Augmentin to cover for possible sinus infection and bronchitis if she does not start to improve on the prednisone. She takes a probiotic daily. She'll continue to monitor her symptoms. She's given return precautions.    Orders Placed This Encounter  Procedures  . POCT Influenza A/B    Meds ordered this encounter  Medications  . albuterol (PROVENTIL) (2.5 MG/3ML) 0.083% nebulizer solution 2.5 mg    Sig:   . predniSONE (DELTASONE) 10 MG tablet    Sig: Please take 60 mg (6 tablets) by mouth today, then decrease by one tablet daily until gone.    Dispense:  21 tablet    Refill:  0  . albuterol (PROVENTIL HFA;VENTOLIN HFA) 108 (90 Base) MCG/ACT inhaler    Sig: Inhale 2 puffs into the lungs every 6 (six) hours as needed for wheezing or shortness of breath.    Dispense:  1 Inhaler    Refill:  0  . benzonatate (TESSALON) 200 MG capsule    Sig: Take 1 capsule (200 mg total) by mouth 2 (two) times daily as needed for cough.    Dispense:  20 capsule    Refill:  0  .  amoxicillin-clavulanate (AUGMENTIN) 875-125 MG tablet    Sig: Take 1 tablet by mouth 2 (two) times daily.    Dispense:  14 tablet    Refill:  0   Tommi Rumps, MD [place]

## 2015-08-28 NOTE — Patient Instructions (Signed)
Nice to see you. Your symptoms are likely related to a bronchitis and upper respiratory illness. We will treat you with prednisone, albuterol inhaler, and Tessalon Per[place]. You should use the albuterol inhaler every 4 hours for the next 2 days and then every 6 hours as needed. I have provided you with a prescription for an antibiotic called Augmentin to take if you're not beginning to improve in the next several days. If you develop worsening symptoms you should be reevaluated. If you develop shortness of breath, chest pain, fevers, numbness, weakness, worsening headache, vision changes, cough productive of blood, or any new or changing symptoms please seek medical attention.

## 2015-08-28 NOTE — Progress Notes (Signed)
Pre visit review using our clinic review tool, if applicable. No additional management support is needed unless otherwise documented below in the visit note. 

## 2015-09-02 ENCOUNTER — Other Ambulatory Visit: Payer: Self-pay | Admitting: Internal Medicine

## 2015-09-02 MED ORDER — ALPRAZOLAM 0.5 MG PO TABS: 0.5000 mg | ORAL_TABLET | Freq: Every evening | ORAL | Status: DC | PRN

## 2015-09-02 NOTE — Telephone Encounter (Signed)
Printed and signed.  

## 2015-09-02 NOTE — Telephone Encounter (Signed)
Pt called about needing a refill for ALPRAZolam (XANAX) 0.5 MG tablet. Pt does not have any more left. Last refill 02/05/2015 used 6 th refill, 03/02/2015 5 refills. Pharmacy is [id number] - [place], [address]. Call pt @ [phone]. Thank you!

## 2015-09-05 ENCOUNTER — Telehealth: Payer: Self-pay | Admitting: Internal Medicine

## 2015-09-05 DIAGNOSIS — K2100 Gastro-esophageal reflux disease with esophagitis, without bleeding: Secondary | ICD-10-CM

## 2015-09-05 NOTE — Telephone Encounter (Signed)
I do not see a recent OV for this patient, last one was in Sept 2016 and no mention of GI referral, please advise, or does she need an appointment to get referral?  Thanks

## 2015-09-05 NOTE — Telephone Encounter (Signed)
Pt called stating that she needs a referral to [facility] for her upper GI problems (hiatel hernia, severe esophagitis). She said that she was told that Dr. Derrel Nip will put in a referral for her. Please advise.

## 2015-09-08 ENCOUNTER — Encounter: Payer: Self-pay | Admitting: Internal Medicine

## 2015-09-08 NOTE — Telephone Encounter (Signed)
Referral is in process as requested 

## 2015-10-02 ENCOUNTER — Encounter: Payer: Medicare Other | Admitting: Obstetrics and Gynecology

## 2015-10-09 ENCOUNTER — Ambulatory Visit (INDEPENDENT_AMBULATORY_CARE_PROVIDER_SITE_OTHER): Payer: Medicare Other | Admitting: Obstetrics and Gynecology

## 2015-10-09 ENCOUNTER — Encounter: Payer: Self-pay | Admitting: Obstetrics and Gynecology

## 2015-10-09 VITALS — BP 114/73 | HR 66 | Ht 65.0 in | Wt 219.3 lb

## 2015-10-09 DIAGNOSIS — Z01419 Encounter for gynecological examination (general) (routine) without abnormal findings: Secondary | ICD-10-CM | POA: Diagnosis not present

## 2015-10-09 DIAGNOSIS — Z8041 Family history of malignant neoplasm of ovary: Secondary | ICD-10-CM

## 2015-10-09 DIAGNOSIS — Z1211 Encounter for screening for malignant neoplasm of colon: Secondary | ICD-10-CM | POA: Diagnosis not present

## 2015-10-09 DIAGNOSIS — Z800 Family history of malignant neoplasm of digestive organs: Secondary | ICD-10-CM

## 2015-10-09 DIAGNOSIS — E669 Obesity, unspecified: Secondary | ICD-10-CM | POA: Diagnosis not present

## 2015-10-09 DIAGNOSIS — N898 Other specified noninflammatory disorders of vagina: Secondary | ICD-10-CM

## 2015-10-09 DIAGNOSIS — Z1239 Encounter for other screening for malignant neoplasm of breast: Secondary | ICD-10-CM
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
## 2015-10-09 NOTE — Patient Instructions (Signed)
1. Pap smear is done. 2. Mammogram is ordered 3. Stool guaiac testing is deferred due to upcoming colonoscopy 4. Continue healthy eating and exercise and weight loss 5. Recommend calcium with vitamin D 1200 mg a day- TUMS 6. Pelvic ultrasound 7. CA-125 8. Return in 1 week after ultrasound for further management planning  9. Annual exam-1 year

## 2015-10-10 LAB — CA 125: CA 125: 11.6 U/mL (ref 0.0–38.1)

## 2015-10-14 LAB — PAP IG W/ RFLX HPV ASCU: PAP SMEAR COMMENT: 0

## 2015-10-20 ENCOUNTER — Encounter: Payer: Self-pay | Admitting: Podiatry

## 2015-10-20 ENCOUNTER — Ambulatory Visit (INDEPENDENT_AMBULATORY_CARE_PROVIDER_SITE_OTHER): Payer: Medicare Other | Admitting: Podiatry

## 2015-10-20 VITALS — BP 116/64 | HR 54 | Resp 16

## 2015-10-20 DIAGNOSIS — L600 Ingrowing nail: Secondary | ICD-10-CM | POA: Diagnosis not present

## 2015-10-20 MED ORDER — NEOMYCIN-POLYMYXIN-HC 1 % OT SOLN: OTIC | Status: DC

## 2015-10-20 NOTE — Patient Instructions (Signed)

## 2015-10-20 NOTE — Progress Notes (Signed)
   Subjective:    Patient ID: Tracey Wiley, female    DOB: 10-12-1955, 60 y.o.   MRN: [id number]  HPI: She presents today with a chief complaint of painful ingrown toenails to the tibial border of the hallux left and fibular borders of the fourth digits bilaterally. She states that her pedicurist has to try to do the cortisone she's been dealing with this for years with elect to have the borders removed if possible.    Review of Systems  All other systems reviewed and are negative.      Objective:   Physical Exam: Vital signs are stable she is alert and oriented 3. Pulses are palpable. Neurologic sensorium is intact. Degenerative flexors are intact muscle strength is normal bilateral. Orthopedic evaluation and x-rays all joints distal to the ankle of a full range of motion without crepitation. Cutaneous evaluation demonstrates supple well-hydrated cutis no erythema edema cellulitis drainage or odor sharply incurvated nail margins along the tibial border of the hallux left and fibular borders of the fourth digits bilaterally. These are exquisitely tender on palpation and ambulation. No open lesions or wounds.        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  Plan: Discussed etiology pathology conservative versus surgical therapies. Performed chemical matrixectomy to the tibial border of the hallux left anterior borders of the fourth digits bilateral after local anesthesia was administered. She tolerated this procedure well. This was performed under local infiltrate. She was sent home with dry sterile compressive dressings both oral and written home-going instructions for care and soaking of her toes as well as a prescription for Cortisporin Otic to be applied twice daily after soaking these toes. I will follow-up with her in 1 week to make sure she is doing well. She have questions or concerns she will notify us immediately.

## 2015-10-29 ENCOUNTER — Ambulatory Visit (INDEPENDENT_AMBULATORY_CARE_PROVIDER_SITE_OTHER): Payer: Medicare Other | Admitting: Podiatry

## 2015-10-29 ENCOUNTER — Encounter: Payer: Self-pay | Admitting: Podiatry

## 2015-10-29 DIAGNOSIS — L600 Ingrowing nail: Secondary | ICD-10-CM

## 2015-10-29 NOTE — Progress Notes (Signed)
She presents today for follow-up of her matrixectomy tibial border hallux left and fibular borders of the fourth digit bilaterally. She states that the hallux is still tender beneath it but all in all it seems to be getting better. She continues to soak twice daily and Betadine warm water and apply Cortisporin Otic as directed.  Objective: Demonstrates no erythema edema saline as drainage or odor to the toes in question. Pulses are palpable I see no signs of infection to the surgical toes.  Assessment: Well-healed matrixectomy is bilateral.  Plan: Redressed today dressing compressive dressing encouraged to recovery in the daytime and leave open at bedtime. Discontinue Betadine soak start with Epsom salts and warm water soaks.

## 2015-10-29 NOTE — Patient Instructions (Signed)

## 2015-10-31 ENCOUNTER — Ambulatory Visit (INDEPENDENT_AMBULATORY_CARE_PROVIDER_SITE_OTHER): Payer: Medicare Other | Admitting: Internal Medicine

## 2015-10-31 ENCOUNTER — Encounter: Payer: Self-pay | Admitting: Internal Medicine

## 2015-10-31 VITALS — BP 128/80 | HR 68 | Ht 65.0 in | Wt 219.4 lb

## 2015-10-31 DIAGNOSIS — R1310 Dysphagia, unspecified: Secondary | ICD-10-CM

## 2015-10-31 DIAGNOSIS — K589 Irritable bowel syndrome without diarrhea: Secondary | ICD-10-CM | POA: Diagnosis not present

## 2015-10-31 DIAGNOSIS — K219 Gastro-esophageal reflux disease without esophagitis: Secondary | ICD-10-CM | POA: Diagnosis not present

## 2015-10-31 MED ORDER — ESOMEPRAZOLE MAGNESIUM 40 MG PO CPDR: 40.0000 mg | DELAYED_RELEASE_CAPSULE | Freq: Two times a day (BID) | ORAL | Status: DC

## 2015-10-31 NOTE — Patient Instructions (Addendum)
  We have sent the following medications to your pharmacy for you to pick up at your convenience: Nexium   Follow up with [place] in 2 months.    I appreciate the opportunity to care for you. Silvano Rusk, MD, [facility]
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
## 2015-11-01 ENCOUNTER — Encounter: Payer: Self-pay | Admitting: Internal Medicine

## 2015-11-13 ENCOUNTER — Telehealth: Payer: Self-pay | Admitting: *Deleted

## 2015-11-13 MED ORDER — AMOXICILLIN 500 MG PO CAPS: ORAL_CAPSULE | ORAL | Status: DC

## 2015-11-13 NOTE — Telephone Encounter (Signed)
Patient will see the dentist on 11/18/15. Patient has requested a refill for amoxicillin, for premedication.  Pharmacy [facility]

## 2015-11-13 NOTE — Telephone Encounter (Signed)
Refill sent.

## 2015-11-14 ENCOUNTER — Other Ambulatory Visit: Payer: Self-pay | Admitting: Cardiovascular Disease

## 2015-11-18 ENCOUNTER — Telehealth: Payer: Self-pay | Admitting: Internal Medicine

## 2015-11-18 NOTE — Telephone Encounter (Signed)
Her breast thermography showed a nonspecific temperature variation in the left breast , so repeat in 6 months is recommended.

## 2015-11-20 NOTE — Telephone Encounter (Signed)
Is having a regular mammogram through GYN at [facility] the thermography patient paid out of pocket for and wishes not to repeat insurance will not cover.

## 2015-11-20 NOTE — Telephone Encounter (Signed)
No problem.

## 2015-12-04 ENCOUNTER — Other Ambulatory Visit: Payer: Medicare (Managed Care)

## 2015-12-11 ENCOUNTER — Ambulatory Visit: Payer: Medicare (Managed Care) | Admitting: Obstetrics and Gynecology

## 2015-12-29 ENCOUNTER — Ambulatory Visit: Payer: Medicare Other | Admitting: Podiatry

## 2016-03-09 ENCOUNTER — Other Ambulatory Visit: Payer: Self-pay | Admitting: Cardiovascular Disease

## 2016-03-09 ENCOUNTER — Other Ambulatory Visit: Payer: Self-pay | Admitting: Internal Medicine

## 2016-03-11 NOTE — Telephone Encounter (Signed)
Okay to refill? The mouth solution was last filled in 2015.

## 2016-03-12 ENCOUNTER — Other Ambulatory Visit: Payer: Self-pay | Admitting: Internal Medicine

## 2016-03-12 ENCOUNTER — Telehealth: Payer: Self-pay | Admitting: Internal Medicine

## 2016-03-12 NOTE — Telephone Encounter (Signed)
This was just handed back to me this afternoon. It was faxed around 3:15 today.

## 2016-03-12 NOTE — Telephone Encounter (Signed)
Xanax rx faxed to [facility]

## 2016-03-12 NOTE — Telephone Encounter (Signed)
latoya can you look into this? It was faxed by you, thanks.

## 2016-03-12 NOTE — Telephone Encounter (Signed)
Pt called and is very upset because she is having a hard time having her prescriptions refilled. She is constantly having issues, [facility] and we keep telling her that we have never received them. [facility] when [facility]ed 2 Rx's over and one was filled and the other was not. The patient is very upset because it is for anxiety and pt has stated that Dr. Derrel Nip would refill this medication without any issues. She is completely out of her ALPRAZolam (XANAX) 0.5 MG tablet and has not had it for a couple of days. Pt states that she really needs this filled and wants to find out why there is such a lack of communication with having her rx filled. She would really like to speak with someone regarding this matter. Thank you!   Call pt @ [phone]  [id number] [place], [place]

## 2016-03-12 NOTE — Telephone Encounter (Signed)
Spoke with the pharmacy, the prescription is ready.  Attempted to reach the patient to discuss further, left a detailed message that she can call back to speak with me. thanks

## 2016-04-08 ENCOUNTER — Ambulatory Visit (INDEPENDENT_AMBULATORY_CARE_PROVIDER_SITE_OTHER): Payer: Medicare Other

## 2016-04-08 DIAGNOSIS — Z23 Encounter for immunization: Secondary | ICD-10-CM | POA: Diagnosis not present

## 2016-05-05 ENCOUNTER — Telehealth: Payer: Self-pay | Admitting: Internal Medicine

## 2016-05-05 NOTE — Telephone Encounter (Signed)
Pt dropped off a Musician to be signed by Dr. Derrel Nip. Paper work is up front in [employer]

## 2016-05-07 NOTE — Telephone Encounter (Signed)
Paperwork has been placed in providers red folder to be filled out

## 2016-05-09 ENCOUNTER — Telehealth: Payer: Self-pay | Admitting: Internal Medicine

## 2016-05-09 DIAGNOSIS — Z7689 Persons encountering health services in other specified circumstances: Secondary | ICD-10-CM

## 2016-05-09 NOTE — Telephone Encounter (Signed)
Disability form has been updated /completed,  Charge is $50

## 2016-05-11 NOTE — Telephone Encounter (Signed)
Patient notified disability form ready , placed copy to chart and billing.

## 2016-05-13 ENCOUNTER — Telehealth: Payer: Self-pay | Admitting: Internal Medicine

## 2016-05-13 NOTE — Telephone Encounter (Signed)
[facility] Patient Name: Tracey Wiley DOB: 05-Oct-1955 Initial Comment caller states left leg and ankle swelling Nurse Assessment Nurse: Andria Frames, RN, Aeriel Date/Time (Eastern Time): 05/13/2016 2:52:32 PM Confirm and document reason for call. If symptomatic, describe symptoms. ---Caller states, she has left leg and left ankle swelling. Caller states, she has lasix and potassium. Most of the time it goes away while she is sleeping. It is not warm not red or it doesn't hurt. The left leg appears to stay a little swollen. Does the patient have any new or worsening symptoms? ---Yes Will a triage be completed? ---Yes Related visit to physician within the last 2 weeks? ---No Does the PT have any chronic conditions? (i.e. diabetes, asthma, etc.) ---Yes List chronic conditions. ---htn Is this a behavioral health or substance abuse call? ---No Guidelines Guideline Title Affirmed Question Affirmed Notes Leg Swelling and Edema [1] Thigh, calf, or ankle swelling AND [2] only 1 side Final Disposition User See Physician within 4 Hours (or PCP triage) Hensel, RN, Aeriel Comments Pt refused outcome and requests a call back. Caller is unable to get transportation today. She is available any time tomorrow. Referrals GO TO FACILITY REFUSED Disagree/Comply: Comply

## 2016-05-13 NOTE — Telephone Encounter (Signed)
See team health note for more details.

## 2016-05-13 NOTE — Telephone Encounter (Signed)
Noted, awaiting Team Health Note.

## 2016-05-13 NOTE — Telephone Encounter (Signed)
FYI  Spoke with patient states she has noticed left ankle and leg swelling for the past week.  Denies any redness to the area or warmth to the leg.  She has been trying to keep legs elevated. 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  Denies shortness of breath .   She is unable to go to urgent care or ER due to no transportation.  Advised patient if symptoms worsened to seek care immediately.   Appointment scheduled with Dr Caryl Bis for 05/14/16.

## 2016-05-13 NOTE — Telephone Encounter (Signed)
Pt called and stated that her left leg and ankle are swelling and has not gone done, there is not any redness or temp changes. Pt did state that she has a hx of blood clots and gout. Sent call to Team Health Triage. Thank you!  Call pt @ [phone]

## 2016-05-14 ENCOUNTER — Ambulatory Visit (INDEPENDENT_AMBULATORY_CARE_PROVIDER_SITE_OTHER): Payer: Medicare Other | Admitting: Family Medicine

## 2016-05-14 ENCOUNTER — Ambulatory Visit
Admission: RE | Admit: 2016-05-14 | Discharge: 2016-05-14 | Disposition: A | Discharge status: Home / Self Care | Payer: Medicare Other | Source: Ambulatory Visit | Attending: Family Medicine | Admitting: Family Medicine

## 2016-05-14 ENCOUNTER — Encounter: Payer: Self-pay | Admitting: Family Medicine

## 2016-05-14 VITALS — BP 132/86 | HR 74 | Temp 97.9°F | Wt 227.0 lb

## 2016-05-14 DIAGNOSIS — R600 Localized edema: Secondary | ICD-10-CM | POA: Insufficient documentation

## 2016-05-14 LAB — COMPREHENSIVE METABOLIC PANEL
ALT: 13 U/L (ref 0–35)
AST: 19 U/L (ref 0–37)
Albumin: 4 g/dL (ref 3.5–5.2)
Alkaline Phosphatase: 73 U/L (ref 39–117)
BUN: 19 mg/dL (ref 6–23)
CO2: 32 mEq/L (ref 19–32)
Calcium: 9.3 mg/dL (ref 8.4–10.5)
Chloride: 101 mEq/L (ref 96–112)
Creatinine, Ser: 0.81 mg/dL (ref 0.40–1.20)
GFR: 77.09 mL/min (ref >60.00)
Glucose, Bld: 114 mg/dL — ABNORMAL HIGH (ref 70–99)
Potassium: 3.9 mEq/L (ref 3.5–5.1)
Sodium: 140 mEq/L (ref 135–145)
Total Bilirubin: 0.4 mg/dL (ref 0.2–1.2)
Total Protein: 7.4 g/dL (ref 6.0–8.3)

## 2016-05-14 LAB — CBC
HEMATOCRIT: 37.2 % (ref 36.0–46.0)
HEMOGLOBIN: 12.3 g/dL (ref 12.0–15.0)
MCHC: 33 g/dL (ref 30.0–36.0)
MCV: 80.1 fl (ref 78.0–100.0)
PLATELETS: 352 10*3/uL (ref 150.0–400.0)
RBC: 4.64 Mil/uL (ref 3.87–5.11)
RDW: 16.2 % — AB (ref 11.5–15.5)
WBC: 8.2 10*3/uL (ref 4.0–10.5)

## 2016-05-14 LAB — TSH: TSH: 1.81 u[IU]/mL (ref 0.35–4.50)

## 2016-05-14 MED ORDER — POTASSIUM CHLORIDE ER 10 MEQ PO TBCR: 10.0000 meq | EXTENDED_RELEASE_TABLET | Freq: Every day | ORAL | 0 refills | Status: DC | PRN

## 2016-05-14 MED ORDER — FUROSEMIDE 20 MG PO TABS: 20.0000 mg | ORAL_TABLET | ORAL | 0 refills | Status: DC | PRN

## 2016-05-14 NOTE — Progress Notes (Signed)
Pre visit review using our clinic review tool, if applicable. No additional management support is needed unless otherwise documented below in the visit note. 

## 2016-05-14 NOTE — Assessment & Plan Note (Signed)
Patient with bilateral lower extremity edema on exam today though recently had left significantly greater than right lower extremity edema. Does have a history of DVT. No recent surgeries or travel. Given exam findings will obtain bilateral venous ultrasounds to rule out DVT. Given bilateral nature we will obtain CMP, CBC, and TSH to evaluate for other causes. Has been prescribed Lasix by cardiologist previously for swelling as needed. Does not take this often unless she has swelling. This will be prescribed to her as her prescription is out of date. She will take a potassium tablet with Lasix if she takes Lasix for swelling. Once the ultrasound returns we will determine the next step in management. Given return precautions.

## 2016-05-14 NOTE — Progress Notes (Signed)
Marikay Alar, MD Phone: [phone]  Tracey Wiley is a 60 y.o. female who presents today for same-day visit.  Patient notes for the last 2 weeks she's had swelling in her left lower extremity. Notes it has been quite a bit larger than the right lower extremity. Notes she has been taking Lasix to help with this prescribed by her cardiologist. When she takes a Lasix tablet she takes a potassium tablet with it. Reduced swelling today. Notes it had been up to mid shins. No pain. No redness. No warmth. No chest pain. No shortness of breath. Does have a history of blood clots that was provoked after surgery. Needs a refill on her Lasix and potassium.   PMH: nonsmoker.   ROS see history of present illness  Objective  Physical Exam Vitals:   05/14/16 0948  BP: 132/86  Pulse: 74  Temp: 97.9 F (36.6 C)    BP Readings from Last 3 Encounters:  05/14/16 132/86  10/31/15 128/80  10/20/15 116/64   Wt Readings from Last 3 Encounters:  05/14/16 227 lb (103 kg)  10/31/15 219 lb 6 oz (99.5 kg)  10/09/15 219 lb 4.8 oz (99.5 kg)    Physical Exam  Constitutional: No distress.  Cardiovascular: Normal rate, regular rhythm and normal heart sounds.   Pulmonary/Chest: Effort normal and breath sounds normal.  Musculoskeletal:  Trace edema bilateral lower extremities, right lower extremity 37 cm, left lower extremity 39.5 cm, calves are nontender, No Cords Palpated  Neurological: She is alert. Gait normal.  Skin: Skin is warm and dry. She is not diaphoretic.     Assessment/Plan: Please see individual problem list.  Bilateral lower extremity edema Patient with bilateral lower extremity edema on exam today though recently had left significantly greater than right lower extremity edema. Does have a history of DVT. No recent surgeries or travel. Given exam findings will obtain bilateral venous ultrasounds to rule out DVT. Given bilateral nature we will obtain CMP, CBC, and TSH to evaluate for  other causes. Has been prescribed Lasix by cardiologist previously for swelling as needed. Does not take this often unless she has swelling. This will be prescribed to her as her prescription is out of date. She will take a potassium tablet with Lasix if she takes Lasix for swelling. Once the ultrasound returns we will determine the next step in management. Given return precautions.   Orders Placed This Encounter  Procedures  . US Venous Img Lower Bilateral    Standing Status:   Future    Number of Occurrences:   1    Standing Expiration Date:   07/15/2017    Order Specific Question:   Reason for Exam (SYMPTOM  OR DIAGNOSIS REQUIRED)    Answer:   bilateral LE edema R>>>L, history DVT    Order Specific Question:   Preferred imaging location?    Answer:   [facility]  . Comp Met ([facility])  . CBC  . TSH    Meds ordered this encounter  Medications  . furosemide (LASIX) 20 MG tablet    Sig: Take 1 tablet (20 mg total) by mouth as needed for edema.    Dispense:  90 tablet    Refill:  0  . potassium chloride (K-DUR) 10 MEQ tablet    Sig: Take 1 tablet (10 mEq total) by mouth daily as needed (With Lasix).    Dispense:  30 tablet    Refill:  0    Marikay Alar, MD [facility] -  [employer]

## 2016-05-14 NOTE — Patient Instructions (Addendum)
Nice to see you We are going to get ultrasounds of your legs and get some lab work to evaluate for cause of your swelling. I have refilled your Lasix and potassium. If you develop chest pain, shortness of breath, or any new or changing symptoms please seek medical attention immediately.

## 2016-06-06 ENCOUNTER — Other Ambulatory Visit: Payer: Self-pay | Admitting: Cardiovascular Disease

## 2016-06-10 ENCOUNTER — Other Ambulatory Visit: Payer: Self-pay | Admitting: Family Medicine

## 2016-06-10 NOTE — Telephone Encounter (Signed)
Patient of Dr.Tullo 

## 2016-06-14 NOTE — Telephone Encounter (Signed)
Patient saw MD  On 05/14/16 was started on potassium.  Should patient continue potassium?

## 2016-06-16 NOTE — Telephone Encounter (Signed)
Patient was placed on this to take with lasix that had previously been prescribed by her cardiologist for as needed use for swelling. Please find out if she has continued to need the Lasix. If she has I can refill the potassium though she will need to follow-up with her cardiologist or her PCP for further refills and discussion of her swelling.

## 2016-06-22 NOTE — Telephone Encounter (Signed)
Left message for patient to call office.  

## 2016-06-25 NOTE — Telephone Encounter (Signed)
Patient stated she does not need refill on potassium.

## 2016-06-30 ENCOUNTER — Other Ambulatory Visit: Payer: Self-pay | Admitting: Cardiovascular Disease

## 2016-07-03 ENCOUNTER — Telehealth: Payer: Self-pay | Admitting: Physician Assistant

## 2016-07-03 NOTE — Telephone Encounter (Signed)
Paged by pharmacy that patient request cholesterol medication. I will only authorize 2 month supply, if she needs more, she will need a followup. She has not been seen in over a year.   Hilbert Corrigan PA Pager: [phone]

## 2016-07-12 ENCOUNTER — Other Ambulatory Visit: Payer: Self-pay | Admitting: Internal Medicine

## 2016-07-12 ENCOUNTER — Other Ambulatory Visit: Payer: Self-pay | Admitting: Cardiovascular Disease

## 2016-07-12 NOTE — Telephone Encounter (Signed)
Last refill on Xanax was 06/07/16 with no future scheduled appts at this time. Pt was last seen on 05/14/16. Ok to refill?

## 2016-07-13 NOTE — Telephone Encounter (Signed)
Refill authorized and printed 

## 2016-08-09 ENCOUNTER — Other Ambulatory Visit: Payer: Self-pay | Admitting: Family Medicine

## 2016-08-09 NOTE — Telephone Encounter (Signed)
Last OV by Dr.Sonnenberg 05/14/16 last filled by Dr.Sonneberg 05/14/16 90 0rf

## 2016-08-11 ENCOUNTER — Telehealth: Payer: Self-pay

## 2016-08-11 NOTE — Telephone Encounter (Signed)
  I started a prior authorization for a quality over ride for her esomeprazole 40mg , she takes it twice daily to decrease her GERD K21.9. The information was given to [place] at [facility] [phone].  We will await the outcome.

## 2016-08-12 NOTE — Telephone Encounter (Signed)
When I saw her last year I had asked her to f/u in 2 mos after - she did not so I assume she is well on the bid esomeprazole  It could be possible she could reduce to qd and see how she does - I suggest she try that and see me in 6-8 weeks.  Esomeprazole 40 mg qd # 90 no RF  I appreciate the opportunity to care for you. Gatha Mayer, MD, [facility]

## 2016-08-12 NOTE — Telephone Encounter (Signed)
Spoke with Tracey Wiley and she is going to come see Dr Carlean Purl 09/02/16 because she is having problems. She doesn't need any esomeprazole sent in now, picked up #30 this week.  She will try to do one a day and only take two a day if she has to take pain medicine.

## 2016-08-12 NOTE — Telephone Encounter (Signed)
Her insurance has denied her esomeprazole 40mg  twice daily for going over their quantity limit requirement of one daily.  Please advise if you want to change her rx or appeal the prior authorization Sir, thank you.

## 2016-08-13 ENCOUNTER — Telehealth: Payer: Self-pay | Admitting: *Deleted

## 2016-08-13 ENCOUNTER — Ambulatory Visit (INDEPENDENT_AMBULATORY_CARE_PROVIDER_SITE_OTHER): Payer: Medicare Other | Admitting: Internal Medicine

## 2016-08-13 ENCOUNTER — Encounter: Payer: Self-pay | Admitting: Internal Medicine

## 2016-08-13 VITALS — BP 122/84 | HR 72 | Resp 16 | Ht 65.0 in | Wt 231.0 lb

## 2016-08-13 DIAGNOSIS — E236 Other disorders of pituitary gland: Secondary | ICD-10-CM | POA: Diagnosis not present

## 2016-08-13 DIAGNOSIS — E669 Obesity, unspecified: Secondary | ICD-10-CM | POA: Diagnosis not present

## 2016-08-13 DIAGNOSIS — K2100 Gastro-esophageal reflux disease with esophagitis, without bleeding: Secondary | ICD-10-CM

## 2016-08-13 DIAGNOSIS — R1310 Dysphagia, unspecified: Secondary | ICD-10-CM | POA: Diagnosis not present

## 2016-08-13 DIAGNOSIS — R1319 Other dysphagia: Secondary | ICD-10-CM

## 2016-08-13 DIAGNOSIS — E7800 Pure hypercholesterolemia, unspecified: Secondary | ICD-10-CM

## 2016-08-13 MED ORDER — TRAMADOL HCL 50 MG PO TABS: 50.0000 mg | ORAL_TABLET | Freq: Three times a day (TID) | ORAL | 3 refills | Status: DC | PRN

## 2016-08-13 MED ORDER — HYOSCYAMINE SULFATE 0.125 MG SL SUBL: 0.1250 mg | SUBLINGUAL_TABLET | SUBLINGUAL | 3 refills | Status: DC | PRN

## 2016-08-13 NOTE — Patient Instructions (Addendum)
For your swallowing problems:  Try swallowing your vitamins with apple sauce   Pre lubricate throat with water  Barium study HAS BEEN  ordered     Take the FULL STRENGTH  nexium in the morning,  Add the OTC 20 mg dose for the evening    No more advil.  Trial of tramadol for pain,  You can take  up to 3 daily IF NEEDED ;   ok to combine with tylenol   Hyoscyamine refilled

## 2016-08-13 NOTE — Progress Notes (Signed)
Subjective:  Patient ID: Tracey Wiley, female    DOB: 1956/02/07  Age: 61 y.o. MRN: [id number]  CC: The primary encounter diagnosis was Esophageal dysphagia. Diagnoses of Obesity (BMI 30-39.9), Gastro-esophageal reflux disease with esophagitis, Pituitary cyst ([place]), and Pure hypercholesterolemia were also pertinent to this visit.  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  Takes Align  daily   No longer seeing Dr Enzo Bi due to a situation that occurred at last appointment when the front office  Woman checked her in 20 minutes late. They refused to see her for her ultrasound.  Refused to have office manager talk with her.   Loves Dr Leafy Ro  Had an endometrial biopsy In the office  And had fluid aspirated from the uterus .  Cytology  was negative for malignant cells   Weight gain.  Starting nutrisystem  Soon but has been procrastinating the initiation of the "fast 5" kickoff .stressed out by loss of mother,  Responsibility for [facility], who has improved .  Has been separated 8.5 years ago,  .    Dysphagia for pills and solids for the past month,  Some esophageal pain .Marland Kitchen  Rarely uses advil but symptoms aggravated by use of it.  Discussed getting a DG Esopphagus  Trial of LEVSIN seeing gessner in a few weeks     Outpatient Medications Prior to Visit  Medication Sig Dispense Refill  . acetaminophen (TYLENOL) 500 MG tablet Take 500 mg by mouth daily as needed for pain.    Marland Kitchen albuterol (PROVENTIL HFA;VENTOLIN HFA) 108 (90 Base) MCG/ACT inhaler Inhale 2 puffs into the lungs every 6 (six) hours as needed for wheezing or shortness of breath. 1 Inhaler 0  . ALPRAZolam (XANAX) 0.5 MG tablet TAKE 1 TABLET BY MOUTH AT BEDTIME OR NEEDED FOR ANXIETY 31 tablet 5  . amoxicillin (AMOXIL) 500 MG capsule 4 tablets by mouth one hour to procedure 4  capsule 3  . aspirin EC 81 MG tablet Take 81 mg by mouth daily.    . chlorhexidine (PERIDEX) 0.12 % solution GARGLE AND SPIT 15 MLS IN THE MOUTH OR THROAT 2 (TWO) TIMES DAILY. (Patient taking differently: GARGLE AND SPIT 15 MLS IN THE MOUTH OR THROAT PRN.) 473 mL 0  . esomeprazole (NEXIUM) 40 MG capsule Take 1 capsule (40 mg total) by mouth 2 (two) times daily before a meal. 60 capsule 11  . furosemide (LASIX) 20 MG tablet TAKE 1 TABLET (20 MG TOTAL) BY MOUTH AS NEEDED FOR EDEMA. 90 tablet 0  . ibuprofen (ADVIL,MOTRIN) 200 MG tablet Take by mouth.    . lidocaine (LIDODERM) 5 % Place 1 patch onto the skin as needed. Remove & Discard patch within 12 hours or as directed by MD    . potassium chloride (K-DUR) 10 MEQ tablet Take 1 tablet (10 mEq total) by mouth daily as needed (With Lasix). 30 tablet 0  . simvastatin (ZOCOR) 40 MG tablet Take 1 tablet (40 mg total) by mouth at bedtime. 90 tablet 3  . sotalol (BETAPACE) 80 MG tablet TAKE 1 TABLET (80 MG TOTAL) BY MOUTH 2 (TWO) TIMES DAILY. 60 tablet 3  . triamterene-hydrochlorothiazide (DYAZIDE) 37.5-25 MG capsule TAKE 1 EACH (1 CAPSULE TOTAL) BY MOUTH EVERY MORNING. 90 capsule 0  . valACYclovir (VALTREX) 1000 MG tablet 1  tablet daily for suppression ,  Or 5 times daily for flare 35 tablet 2  . hyoscyamine (LEVSIN SL) 0.125 MG SL tablet Place 1 tablet (0.125 mg total) under the tongue every 4 (four) hours as needed for cramping. 60 tablet 3  . CALCIUM PO Take 750 mg by mouth daily.     . chlorhexidine (PERIDEX) 0.12 % solution GARGLE AND SPIT 15 MLS IN THE MOUTH OR THROAT 2 (TWO) TIMES DAILY. (Patient not taking: Reported on 08/13/2016) 473 mL 0  . Cholecalciferol (VITAMIN D PO) Take 1 tablet by mouth daily.    . NEOMYCIN-POLYMYXIN-HYDROCORTISONE (CORTISPORIN) 1 % SOLN otic solution Apply 1-2 drops to toe BID after soaking (Patient not taking: Reported on 08/13/2016) 10 mL 1  . triamterene-hydrochlorothiazide (DYAZIDE) 37.5-25 MG per capsule Take 1 capsule by  mouth daily.     No facility-administered medications prior to visit.     Review of Systems;  Patient denies headache, fevers, malaise, unintentional weight loss, skin rash, eye pain, sinus congestion and sinus pain, sore throat, dysphagia,  hemoptysis , cough, dyspnea, wheezing, chest pain, palpitations, orthopnea, edema, abdominal pain, nausea, melena, diarrhea, constipation, flank pain, dysuria, hematuria, urinary  Frequency, nocturia, numbness, tingling, seizures,  Focal weakness, Loss of consciousness,  Tremor, insomnia, depression, anxiety, and suicidal ideation.      Objective:  BP 122/84 (BP Location: Left Arm, Patient Position: Sitting, Cuff Size: Large)   Pulse 72   Resp 16   Ht 5\' 5"  (1.651 m)   Wt 231 lb (104.8 kg)   SpO2 98%   BMI 38.44 kg/m   BP Readings from Last 3 Encounters:  08/13/16 122/84  05/14/16 132/86  10/31/15 128/80    Wt Readings from Last 3 Encounters:  08/13/16 231 lb (104.8 kg)  05/14/16 227 lb (103 kg)  10/31/15 219 lb 6 oz (99.5 kg)    General appearance: alert, cooperative and appears stated age Ears: normal TM's and external ear canals both ears Throat: lips, mucosa, and tongue normal; teeth and gums normal Neck: no adenopathy, no carotid bruit, supple, symmetrical, trachea midline and thyroid not enlarged, symmetric, no tenderness/mass/nodules Back: symmetric, no curvature. ROM normal. No CVA tenderness. Lungs: clear to auscultation bilaterally Heart: regular rate and rhythm, S1, S2 normal, no murmur, click, rub or gallop Abdomen: soft, non-tender; bowel sounds normal; no masses,  no organomegaly Pulses: 2+ and symmetric Skin: Skin color, texture, turgor normal. No rashes or lesions Lymph nodes: Cervical, supraclavicular, and axillary nodes normal.  Lab Results  Component Value Date   HGBA1C 6.3 07/25/2014   HGBA1C 6.1 12/19/2012    Lab Results  Component Value Date   CREATININE 0.81 05/14/2016   CREATININE 0.72 07/31/2015    CREATININE 0.80 12/17/2014    Lab Results  Component Value Date   WBC 8.2 05/14/2016   HGB 12.3 05/14/2016   HCT 37.2 05/14/2016   PLT 352.0 05/14/2016   GLUCOSE 114 (H) 05/14/2016   CHOL 153 07/31/2015   TRIG 166 (H) 07/31/2015   HDL 38 (L) 07/31/2015   LDLCALC 82 07/31/2015   ALT 13 05/14/2016   AST 19 05/14/2016   NA 140 05/14/2016   K 3.9 05/14/2016   CL 101 05/14/2016   CREATININE 0.81 05/14/2016   BUN 19 05/14/2016   CO2 32 05/14/2016   TSH 1.81 05/14/2016   HGBA1C 6.3 07/25/2014    US Venous Img Lower Bilateral  Result Date: 05/14/2016 CLINICAL DATA:  Lower extremity edema for 10 days. Remote history of DVT.  EXAM: BILATERAL LOWER EXTREMITY VENOUS DOPPLER ULTRASOUND TECHNIQUE: Gray-scale sonography with graded compression, as well as color Doppler and duplex ultrasound were performed to evaluate the lower extremity deep venous systems from the level of the common femoral vein and including the common femoral, femoral, profunda femoral, popliteal and calf veins including the posterior tibial, peroneal and gastrocnemius veins when visible. The superficial great saphenous vein was also interrogated. Spectral Doppler was utilized to evaluate flow at rest and with distal augmentation maneuvers in the common femoral, femoral and popliteal veins. COMPARISON:  None. FINDINGS: RIGHT LOWER EXTREMITY Common Femoral Vein: No evidence of thrombus. Normal compressibility, respiratory phasicity and response to augmentation. Saphenofemoral Junction: No evidence of thrombus. Normal compressibility and flow on color Doppler imaging. Profunda Femoral Vein: No evidence of thrombus. Normal compressibility and flow on color Doppler imaging. Femoral Vein: No evidence of thrombus. Normal compressibility, respiratory phasicity and response to augmentation. Popliteal Vein: No evidence of thrombus. Normal compressibility, respiratory phasicity and response to augmentation. Calf Veins: No evidence of thrombus.  Normal compressibility and flow on color Doppler imaging. Superficial Great Saphenous Vein: No evidence of thrombus. Normal compressibility and flow on color Doppler imaging. Venous Reflux:  None. Other Findings:  None. LEFT LOWER EXTREMITY Common Femoral Vein: No evidence of thrombus. Normal compressibility, respiratory phasicity and response to augmentation. Saphenofemoral Junction: No evidence of thrombus. Normal compressibility and flow on color Doppler imaging. Profunda Femoral Vein: No evidence of thrombus. Normal compressibility and flow on color Doppler imaging. Femoral Vein: No evidence of thrombus. Normal compressibility, respiratory phasicity and response to augmentation. Popliteal Vein: No evidence of thrombus. Normal compressibility, respiratory phasicity and response to augmentation. Calf Veins: No evidence of thrombus. Normal compressibility and flow on color Doppler imaging. Superficial Great Saphenous Vein: No evidence of thrombus. Normal compressibility and flow on color Doppler imaging. Venous Reflux:  None. Other Findings:  None. IMPRESSION: No evidence of deep venous thrombosis. Electronically Signed   By: Jerilynn Mages.  Shick M.D.   On: 05/14/2016 14:10    Assessment & Plan:   Problem List Items Addressed This Visit    Dysphagia - Primary    Associated with pills and solids,  With occasional pain .  HAS GI follow up in a few weeks.  Dg esophagus and Levsin ordered.       Relevant Orders   DG Esophagus   Gastro-esophageal reflux disease with esophagitis    Chronic,  Advised to add an afternoon dose of OTC nexium.  EGD planned at gi follow up with dr Carlean Purl,  Dg esophagus ordered.       Hyperlipidemia    Low HDL,  LDL < 100.  No therapy other than diet an exercise recommended unless repeat fasting labs are significantly different,   Lab Results  Component Value Date   CHOL 153 07/31/2015   HDL 38 (L) 07/31/2015   LDLCALC 82 07/31/2015   TRIG 166 (H) 07/31/2015   CHOLHDL 4.0  07/31/2015         Obesity (BMI 30-39.9)    Worsening despite patient's concern .  a1c now 6.3 so she appears to have developed impair fasting glucose if not type 2 DM , given no actual fasting glucose of 125.  I have addressed  BMI and recommended a low glycemic index diet utilizing smaller more frequent meals to increase metabolism.  I have also recommended that patient start exercising with a goal of 30 minutes of aerobic exercise a minimum of 5 days per week. 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  Prior workup with endocrinology in 2015 showed no evidence through imaging or labs         I have discontinued Ms. Barraco CALCIUM PO, Cholecalciferol (VITAMIN D PO), and NEOMYCIN-POLYMYXIN-HYDROCORTISONE. I am also having her start on traMADol. Additionally, I am having her maintain her acetaminophen, aspirin EC, chlorhexidine, lidocaine, valACYclovir, simvastatin, albuterol, ibuprofen, esomeprazole, amoxicillin, potassium chloride, triamterene-hydrochlorothiazide, sotalol, ALPRAZolam, furosemide, calcium-vitamin D, and hyoscyamine.  Meds ordered this encounter  Medications  . calcium-vitamin D 250-100 MG-UNIT tablet    Sig: Take 1 tablet by mouth 2 (two) times daily.  . hyoscyamine (LEVSIN SL) 0.125 MG SL tablet    Sig: Place 1 tablet (0.125 mg total) under the tongue every 4 (four) hours as needed for cramping.    Dispense:  60 tablet    Refill:  3  . traMADol (ULTRAM) 50 MG tablet    Sig: Take 1 tablet (50 mg total) by mouth every 8 (eight) hours as needed.    Dispense:  90  tablet    Refill:  3    Medications Discontinued During This Encounter  Medication Reason  . chlorhexidine (PERIDEX) 8.17 % solution Duplicate  . Cholecalciferol (VITAMIN D PO) Error  . CALCIUM PO Error  . NEOMYCIN-POLYMYXIN-HYDROCORTISONE (CORTISPORIN) 1 % SOLN otic solution Therapy completed  . triamterene-hydrochlorothiazide (DYAZIDE) 37.5-25 MG per capsule Duplicate  . hyoscyamine (LEVSIN SL) 0.125 MG SL tablet Reorder   A total of 25 minutes of face to face time was spent with patient more than half of which was spent in counselling about the above mentioned conditions  and coordination of care .   Follow-up: No Follow-up on file.   Crecencio Mc, MD

## 2016-08-13 NOTE — Telephone Encounter (Signed)
Pt was advised by [name] of a great grievances group in [place]. Pt requested that information  Pt contact   [phone]

## 2016-08-13 NOTE — Progress Notes (Signed)
Pre visit review using our clinic review tool, if applicable. No additional management support is needed unless otherwise documented below in the visit note. 

## 2016-08-15 DIAGNOSIS — R1310 Dysphagia, unspecified: Secondary | ICD-10-CM | POA: Insufficient documentation

## 2016-08-15 NOTE — Assessment & Plan Note (Addendum)
Chronic,  Advised to add an afternoon dose of OTC nexium.  EGD planned at gi follow up with dr Carlean Purl,  Dg esophagus ordered.

## 2016-08-15 NOTE — Assessment & Plan Note (Signed)
Worsening despite patient's concern .  a1c now 6.3 so she appears to have developed impair fasting glucose if not type 2 DM , given no actual fasting glucose of 125.  I have addressed  BMI and recommended a low glycemic index diet utilizing smaller more frequent meals to increase metabolism.  I have also recommended that patient start exercising with a goal of 30 minutes of aerobic exercise a minimum of 5 days per week. Screening for lipid disorders, thyroid and diabetes has been done  Lab Results  Component Value Date   HGBA1C 6.3 07/25/2014   Lab Results  Component Value Date   TSH 1.81 05/14/2016   Lab Results  Component Value Date   CHOL 153 07/31/2015   HDL 38 (L) 07/31/2015   LDLCALC 82 07/31/2015   TRIG 166 (H) 07/31/2015   CHOLHDL 4.0 07/31/2015

## 2016-08-15 NOTE — Assessment & Plan Note (Signed)
Her recent weight gain is due to lack of exercise and poor diet.  Prior workup with endocrinology in 2015 showed no evidence through imaging or labs

## 2016-08-15 NOTE — Assessment & Plan Note (Signed)
Low HDL,  LDL < 100.  No therapy other than diet an exercise recommended unless repeat fasting labs are significantly different,   Lab Results  Component Value Date   CHOL 153 07/31/2015   HDL 38 (L) 07/31/2015   LDLCALC 82 07/31/2015   TRIG 166 (H) 07/31/2015   CHOLHDL 4.0 07/31/2015

## 2016-08-15 NOTE — Assessment & Plan Note (Signed)
Associated with pills and solids,  With occasional pain .  HAS GI follow up in a few weeks.  Dg esophagus and Levsin ordered.

## 2016-08-16 NOTE — Telephone Encounter (Signed)
Please advise 

## 2016-08-16 NOTE — Telephone Encounter (Signed)
The grief counsellor is Trey Paula  [phone] (cell) .  Her  Office recently moved,  so the office number  [phone] may no longer be working

## 2016-08-17 NOTE — Telephone Encounter (Signed)
Patient has been informed.

## 2016-08-29 ENCOUNTER — Other Ambulatory Visit: Payer: Self-pay | Admitting: Cardiovascular Disease

## 2016-08-30 ENCOUNTER — Encounter: Payer: Self-pay | Admitting: Cardiovascular Disease

## 2016-08-30 ENCOUNTER — Ambulatory Visit
Admission: RE | Admit: 2016-08-30 | Discharge: 2016-08-30 | Disposition: A | Discharge status: Home / Self Care | Payer: Medicare Other | Source: Ambulatory Visit | Attending: Internal Medicine | Admitting: Internal Medicine

## 2016-08-30 DIAGNOSIS — K222 Esophageal obstruction: Secondary | ICD-10-CM | POA: Insufficient documentation

## 2016-08-30 DIAGNOSIS — R1310 Dysphagia, unspecified: Secondary | ICD-10-CM | POA: Diagnosis not present

## 2016-08-30 DIAGNOSIS — R1319 Other dysphagia: Secondary | ICD-10-CM

## 2016-08-30 NOTE — Telephone Encounter (Signed)
This encounter was created in error - please disregard.

## 2016-08-31 ENCOUNTER — Telehealth: Payer: Self-pay

## 2016-08-31 NOTE — Telephone Encounter (Signed)
-----   Message from Crecencio Mc, MD sent at 08/30/2016 11:36 PM EDT ----- Esophageal study reflects temporary /transient trouble passing the 13 mm barium pill, but with a second swallow it passes.  No evidence of esophageal mass or or reflux. She should see gasroenterology for endoscopy

## 2016-08-31 NOTE — Telephone Encounter (Signed)
LMTCB

## 2016-09-02 ENCOUNTER — Ambulatory Visit (INDEPENDENT_AMBULATORY_CARE_PROVIDER_SITE_OTHER): Payer: Medicare Other | Admitting: Internal Medicine

## 2016-09-02 ENCOUNTER — Telehealth: Payer: Self-pay

## 2016-09-02 ENCOUNTER — Ambulatory Visit: Payer: Medicare Other | Admitting: Cardiovascular Disease

## 2016-09-02 ENCOUNTER — Encounter: Payer: Self-pay | Admitting: Internal Medicine

## 2016-09-02 VITALS — BP 122/80 | HR 72 | Ht 64.0 in | Wt 227.4 lb

## 2016-09-02 DIAGNOSIS — K644 Residual hemorrhoidal skin tags: Secondary | ICD-10-CM

## 2016-09-02 DIAGNOSIS — R1310 Dysphagia, unspecified: Secondary | ICD-10-CM | POA: Diagnosis not present

## 2016-09-02 DIAGNOSIS — K219 Gastro-esophageal reflux disease without esophagitis: Secondary | ICD-10-CM

## 2016-09-02 DIAGNOSIS — K222 Esophageal obstruction: Secondary | ICD-10-CM

## 2016-09-02 DIAGNOSIS — R1319 Other dysphagia: Secondary | ICD-10-CM

## 2016-09-02 NOTE — Progress Notes (Signed)
   Tracey Wiley 61 y.o. 1955-07-27 [id number]  Assessment & Plan:   Encounter Diagnoses  Name Primary?  . GERD with stricture Yes  . Esophageal dysphagia   . Anal skin tag     We will send a prior authorization to have her twice a day 40 mg esomeprazole approved. She's having recurrent dysphagia off this and will schedule upper GI endoscopy with esophageal dilation. Suspect all from GERD but keep in mind the possibility of eosinophilic esophagitis.  I reviewed what an anal skin tag is and reassured her she will continue to live with that.  I appreciate the opportunity to care for this patient. CC: Crecencio Mc, MD  Subjective:   Chief Complaint: Prescription medication coverage issues and dysphagia  HPI The patient is here complaining of intermittent solid food dysphagia for some months now. Barium swallow was performed 2 days ago and there was transient trapping of a 13 mm barium tablet was some smooth tapering of the distal esophagus consistent with a possible stricture. Findings including the images were reviewed in person with the patient. Food such as bread and chicken and rice and some large pills hang and have to be aided by drinking water for them to pass into the stomach. Recently her insurance company stopped supplying twice a day 40 mg generic Nexium and restricted it to daily her only 30 capsules or tablets in one month. She has been supplementing with over-the-counter Nexium but is concerned by the 24-hour label on it.Note that we did try to reduce it to daily late last year early this year and she did not tolerate that with breakthrough symptoms.  She also c/o long-standing protrusion from anus thought to be hemorrhoids and wonders about having this "fixed". Been worried about hemorrhoids over the years ever since a gastroenterologist told her after a colonoscopy, this was in [place], that her hemorrhoids said hello. He does not have rectal bleeding or rectal  pain.  She reminds me today how I cared for her mother when she was in the hospital with CMV colitis and also problems with a hernia and weight loss. Her mother actually passed away due to Elfredia Nevins from a flu shot around that time in 2015. Medications, allergies, past medical history, past surgical history, family history and social history are reviewed and updated in the EMR.  Review of Systems As above  Objective:   Physical Exam BP 122/80 (BP Location: Left Arm, Patient Position: Sitting, Cuff Size: Normal)   Pulse 72   Ht 5\' 4"  (1.626 m) Comment: height measured without shoes  Wt 227 lb 6 oz (103.1 kg)   BMI 39.03 kg/m  Lungs CTA Cor S1S2 abd obese Rectal Patti [place], CMA present. Anoderm - small-med anterior anal tags

## 2016-09-02 NOTE — Patient Instructions (Signed)
You have been scheduled for an endoscopy. Please follow written instructions given to you at your visit today. If you use inhalers (even only as needed), please bring them with you on the day of your procedure. Your physician has requested that you go to www.startemmi.com and enter the access code given to you at your visit today. This web site gives a general overview about your procedure. However, you should still follow specific instructions given to you by our office regarding your preparation for the procedure.    We will work on a prior authorization for your Nexium.     I appreciate the opportunity to care for you. Silvano Rusk, MD, [facility]

## 2016-09-02 NOTE — Telephone Encounter (Signed)
Pt called back returning your call. Pt will be home until 1:30. Please advise, thank you!  Call pt @ [phone]

## 2016-09-02 NOTE — Telephone Encounter (Signed)
Patient came in today and stated that she needs an urgent prior authorization letter faxed to [employer] at fax # [phone] for her generic nexium BID use.  Dr Carlean Purl wrote this and it was faxed.  She has GERD and an esophageal stricture.

## 2016-09-04 NOTE — Telephone Encounter (Signed)
Patient informed and she is going to call [facility] to get them to fill it.

## 2016-09-04 NOTE — Telephone Encounter (Signed)
Got approval letter for her generic nexium , valid thru 06/06/2017. Letter sent to be scanned in.

## 2016-09-06 NOTE — Progress Notes (Signed)
Cardiology Office Note  Date:  09/07/2016   ID:  Tracey Wiley, DOB 1955-06-10, MRN [id number]  PCP:  Tracey Mc, MD   Chief Complaint  Patient presents with  . other    LS 2016 c/o left leg swelling and discoloration. Meds reviewed verbally with pt.    HPI:  [name] is a very pleasant 61 year old woman with a  history of  PFO,  with  closure device in April 2008 performed in [place], hyperlipidemia,  PAT versus paroxysmal atrial fibrillation on sotalol, hypertension,  Bell's palsy gout GERD who presents today for follow-up of her arrhythmia Significant baseline stress, continues to care for her ex-husband who has underlying cognitive issues  In follow-up today her main complaint is worsening lower extremity edema She had these symptoms December 2017 and had improvement on Lasix She's not been taking Lasix on a regular basis, only started 2 days ago for recurrent leg swelling symptoms, pain She has been taking potassium with her Lasix Denies excessive fluid intake Only eats out occasionally  She denies any recent episodes of tachycardia  Total chol 153, LDL 82  EKG on today's visit shows normal sinus rhythm rate 65 bpm with nonspecific T wave abnormality, no significant ST abnormalities QTC 474  On previous office visit she reported having an inflammatory issue as she had migrating arthralgias and swelling She has had significant workup but diagnosis has been challenging  Previously mentioned having a problem with her uterus, was scheduled for hysterectomy but this has been canceled in the setting of her inflammatory issue  Other past medical history  lost her mother in February 2015.  History of gout in her wrists. Improvement with colchicine. 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  PSH:    Past Surgical History:  Procedure Laterality Date  . BREAST BIOPSY Right 2009   lymphoid and fibroadipose tissue  . COLONOSCOPY  08-2014  . DILATION AND CURETTAGE OF UTERUS    . ESOPHAGOGASTRODUODENOSCOPY    . HYSTEROSCOPY W/D&C  01/20/2015   Procedure: DILATATION AND CURETTAGE /HYSTEROSCOPY;  Surgeon: Brayton Mars, MD;  Location: [facility] ORS;  Service: Gynecology;;  . [facility] EAR SURGERY     x 4  . LAPAROSCOPY  01/20/2015   Procedure: LAPAROSCOPY DIAGNOSTIC;  Surgeon: Brayton Mars, MD;  Location: [facility] OR[facility];  Service: Gynecology;;  . NASAL SEPTUM SURGERY    . PATENT FORAMEN OVALE CLOSURE  April 2008  . TMJ x 2    . uterine ablation  March 2008    Current Outpatient Prescriptions  Medication Sig Dispense Refill  . acetaminophen (TYLENOL) 500 MG tablet Take 500 mg by mouth daily as needed for pain.    Marland Kitchen albuterol (PROVENTIL HFA;VENTOLIN HFA) 108 (90 Base) MCG/ACT inhaler Inhale 2 puffs  into the lungs every 6 (six) hours as needed for wheezing or shortness of breath. 1 Inhaler 0  . ALPRAZolam (XANAX) 0.5 MG tablet TAKE 1 TABLET BY MOUTH AT BEDTIME OR NEEDED FOR ANXIETY  31 tablet 5  . amoxicillin (AMOXIL) 500 MG capsule 4 tablets by mouth one hour to procedure 4 capsule 3  . aspirin EC 81 MG tablet Take 81 mg by mouth daily.    . calcium-vitamin D 250-100 MG-UNIT tablet Take 1 tablet by mouth 2 (two) times daily.    . chlorhexidine (PERIDEX) 0.12 % solution GARGLE AND SPIT 15 MLS IN THE MOUTH OR THROAT 2 (TWO) TIMES DAILY. (Patient taking differently: GARGLE AND SPIT 15 MLS IN THE MOUTH OR THROAT PRN.) 473 mL 0  . colchicine 0.6 MG tablet Take 1 tablet (0.6 mg total) by mouth 2 (two) times daily as needed. 30 tablet 1  . esomeprazole (NEXIUM) 40 MG capsule Take 1 capsule (40 mg total) by mouth 2 (two) times daily before a meal. 60 capsule 11  . furosemide (LASIX) 20 MG tablet Take 1 tablet (20 mg total) by mouth daily as needed for edema. 90 tablet 3  . hyoscyamine (LEVSIN SL) 0.125 MG SL tablet Place 1 tablet (0.125 mg total) under the tongue every 4 (four) hours as needed for cramping. 60 tablet 3  . ibuprofen (ADVIL,MOTRIN) 200 MG tablet Take by mouth as needed.     . lidocaine (LIDODERM) 5 % Place 1 patch onto the skin as needed. Remove & Discard patch within 12 hours or as directed by MD    . potassium chloride (K-DUR) 10 MEQ tablet Take 1 tablet (10 mEq total) by mouth daily as needed (With Lasix). 90 tablet 3  . simvastatin (ZOCOR) 40 MG tablet Take 1 tablet (40 mg total) by mouth daily. 90 tablet 3  . sotalol (BETAPACE) 80 MG tablet Take 1 tablet (80 mg total) by mouth 2 (two) times daily. 180 tablet 3  . traMADol (ULTRAM) 50 MG tablet Take 1 tablet (50 mg total) by mouth every 8 (eight) hours as needed. 90 tablet 3  . triamterene-hydrochlorothiazide (DYAZIDE) 37.5-25 MG capsule Take 1 each (1 capsule total) by mouth daily. 90 capsule 3  . valACYclovir (VALTREX) 1000 MG tablet 1 tablet daily for suppression ,  Or 5 times daily for flare 35 tablet 2   No current facility-administered medications for this visit.      Allergies:   2,4-d dimethylamine  (amisol); Clarithromycin; Nitrofurantoin monohyd macro; Other; Azithromycin; Ciprofloxacin; Codeine; Erythromycin base; Hydrocodone-acetaminophen; Morphine; Nitrofurantoin; Pamelor [nortriptyline hcl]; Stadol [butorphanol]; Talwin [pentazocine]; Topamax [topiramate]; Wellbutrin [bupropion]; Zanaflex [tizanidine hcl]; Lamictal [lamotrigine]; and Macrobid [nitrofurantoin macrocrystal]   Social History:  The patient  reports that she has never smoked. She has never used smokeless tobacco. She reports that she does not drink alcohol or use drugs.   Family History:   family history includes Colon cancer (age of onset: 28) in her sister; Heart failure in her father; Hyperlipidemia in her mother; Hypertension in her mother; Kidney disease in her mother; Uterine cancer (age of onset: 27) in her mother.    Review of Systems: Review of Systems  Constitutional: Negative.   Respiratory: Negative.   Cardiovascular: Positive for leg swelling.  Gastrointestinal: Negative.   Musculoskeletal: Negative.   Neurological: Negative.   Psychiatric/Behavioral: Negative.   All other systems reviewed and are negative.    PHYSICAL EXAM: VS:  BP 128/78 (BP Location: Left Arm, Patient Position: Sitting, Cuff Size: Normal)  Pulse 65   Ht 5\' 5"  (1.651 m)   Wt 225 lb 8 oz (102.3 kg)   BMI 37.53 kg/m  , BMI Body mass index is 37.53 kg/m. GEN: Well nourished, well developed, in no acute distress , obese HEENT: normal  Neck: no JVD, carotid bruits, or masses Cardiac: RRR; no murmurs, rubs, or gallops,no edema  Respiratory:  clear to auscultation bilaterally, normal work of breathing GI: soft, nontender, nondistended, + BS MS: no deformity or atrophy  Skin: warm and dry, no rash Neuro:  Strength and sensation are intact Psych: euthymic mood, full affect    Recent Labs: 05/14/2016: ALT 13; BUN 19; Creatinine, Ser 0.81; Hemoglobin 12.3; Platelets 352.0; Potassium 3.9; Sodium 140; TSH 1.81    Lipid Panel Lab  Results  Component Value Date   CHOL 153 07/31/2015   HDL 38 (L) 07/31/2015   LDLCALC 82 07/31/2015   TRIG 166 (H) 07/31/2015      Wt Readings from Last 3 Encounters:  09/07/16 225 lb 8 oz (102.3 kg)  09/02/16 227 lb 6 oz (103.1 kg)  08/13/16 231 lb (104.8 kg)       ASSESSMENT AND PLAN:  SVT/ PSVT/ PAT - Plan: EKG 12-Lead Denies having any significant symptoms of atrial tachycardia We'll slowly wean down on her sotalol  Mixed hyperlipidemia - Plan: EKG 12-Lead Cholesterol is at goal on the current lipid regimen. No changes to the medications were made.  Obesity (BMI 30-39.9) - Plan: EKG 12-Lead Weight is down several pounds from prior clinic visit  Bilateral lower extremity edema - Plan: EKG 12-Lead Recommended she continue to take Lasix for several more days, compression hose then Lasix as needed. Recommended she watch her fluid and salt intake  Prolonged QT interval Recommended she decrease sotalol down to 40 mg twice a day  If she has symptoms she could take sotalol 40 3 times a day or 100 mg in the morning, 50 mg at night. We'll slowly try to wean down on her sotalol   Disposition:   F/U  12 months   Total encounter time more than 25 minutes  Greater than 50% was spent in counseling and coordination of care with the patient    Orders Placed This Encounter  Procedures  . EKG 12-Lead     Signed, Esmond Plants, M.D., Ph.D. 09/07/2016  [place], [place]

## 2016-09-07 ENCOUNTER — Encounter: Payer: Self-pay | Admitting: Cardiovascular Disease

## 2016-09-07 ENCOUNTER — Ambulatory Visit (INDEPENDENT_AMBULATORY_CARE_PROVIDER_SITE_OTHER): Payer: Medicare Other | Admitting: Cardiovascular Disease

## 2016-09-07 VITALS — BP 128/78 | HR 65 | Ht 65.0 in | Wt 225.5 lb

## 2016-09-07 DIAGNOSIS — E669 Obesity, unspecified: Secondary | ICD-10-CM | POA: Diagnosis not present

## 2016-09-07 DIAGNOSIS — E782 Mixed hyperlipidemia: Secondary | ICD-10-CM | POA: Diagnosis not present

## 2016-09-07 DIAGNOSIS — R9431 Abnormal electrocardiogram [ECG] [EKG]: Secondary | ICD-10-CM

## 2016-09-07 DIAGNOSIS — I471 Supraventricular tachycardia: Secondary | ICD-10-CM | POA: Diagnosis not present

## 2016-09-07 DIAGNOSIS — R600 Localized edema: Secondary | ICD-10-CM | POA: Diagnosis not present

## 2016-09-07 MED ORDER — SIMVASTATIN 40 MG PO TABS: 40.0000 mg | ORAL_TABLET | Freq: Every day | ORAL | 3 refills | Status: DC

## 2016-09-07 MED ORDER — SOTALOL HCL 80 MG PO TABS: 80.0000 mg | ORAL_TABLET | Freq: Two times a day (BID) | ORAL | 3 refills | Status: DC

## 2016-09-07 MED ORDER — TRIAMTERENE-HCTZ 37.5-25 MG PO CAPS: 1.0000 | ORAL_CAPSULE | Freq: Every day | ORAL | 3 refills | Status: DC

## 2016-09-07 MED ORDER — COLCHICINE 0.6 MG PO TABS
0.6000 mg | ORAL_TABLET | Freq: Two times a day (BID) | ORAL | 1 refills | Status: DC | PRN
Start: 2016-09-07 — End: 2019-04-19

## 2016-09-07 MED ORDER — FUROSEMIDE 20 MG PO TABS: 20.0000 mg | ORAL_TABLET | Freq: Every day | ORAL | 3 refills | Status: DC | PRN

## 2016-09-07 MED ORDER — POTASSIUM CHLORIDE ER 10 MEQ PO TBCR: 10.0000 meq | EXTENDED_RELEASE_TABLET | Freq: Every day | ORAL | 3 refills | Status: DC | PRN

## 2016-09-07 NOTE — Patient Instructions (Signed)

## 2016-09-07 NOTE — Telephone Encounter (Signed)
See message in result notes. Pt has been notified.

## 2016-10-07 ENCOUNTER — Other Ambulatory Visit: Payer: Self-pay

## 2016-10-07 MED ORDER — ESOMEPRAZOLE MAGNESIUM 40 MG PO CPDR: 40.0000 mg | DELAYED_RELEASE_CAPSULE | Freq: Two times a day (BID) | ORAL | 3 refills | Status: DC

## 2016-10-11 ENCOUNTER — Encounter: Payer: Medicare Other | Admitting: Internal Medicine

## 2016-10-12 ENCOUNTER — Encounter: Payer: Medicare (Managed Care) | Admitting: Obstetrics and Gynecology

## 2016-10-12 ENCOUNTER — Encounter: Payer: Self-pay | Admitting: Internal Medicine

## 2016-10-22 ENCOUNTER — Ambulatory Visit (AMBULATORY_SURGERY_CENTER): Payer: Medicare Other | Admitting: Internal Medicine

## 2016-10-22 ENCOUNTER — Encounter: Payer: Self-pay | Admitting: Internal Medicine

## 2016-10-22 ENCOUNTER — Telehealth: Payer: Self-pay | Admitting: Internal Medicine

## 2016-10-22 VITALS — BP 94/54 | HR 65 | Temp 98.0°F | Resp 16 | Ht 64.0 in | Wt 227.0 lb

## 2016-10-22 DIAGNOSIS — R1310 Dysphagia, unspecified: Secondary | ICD-10-CM

## 2016-10-22 DIAGNOSIS — K219 Gastro-esophageal reflux disease without esophagitis: Secondary | ICD-10-CM | POA: Diagnosis not present

## 2016-10-22 DIAGNOSIS — R1319 Other dysphagia: Secondary | ICD-10-CM

## 2016-10-22 MED ORDER — SODIUM CHLORIDE 0.9 % IV SOLN: 500.0000 mL | INTRAVENOUS | Status: DC

## 2016-10-22 NOTE — Progress Notes (Signed)
Pt. States nausea is better.  She stayed extended period in recovery because she did not feel like getting up secondary To vertigo symptoms.  She felt well at discharge.

## 2016-10-22 NOTE — Telephone Encounter (Signed)
Patient fiance had procedure today 5.18.18 and wants to know if she can take advil

## 2016-10-22 NOTE — Telephone Encounter (Signed)
Pt. Has sore throat post endoscopy this AM.  Boyfriend calls to ask if she can take Advil.  Spoke with Dr. Carlean Purl. He advised that it is fine for her to take advil.  I advised him that it is OK to take Advil for sore throat.

## 2016-10-22 NOTE — Op Note (Signed)
[facility] Patient Name: Tracey Wiley Procedure Date: 10/22/2016 10:02 AM MRN: [id number] Endoscopist: Gatha Mayer , MD Age: 61 Referring MD:  Date of Birth: November 19, 1955 Gender: Female Account #: [id number] Procedure:                Upper GI endoscopy Indications:              Dysphagia Medicines:                Propofol per Anesthesia, Monitored Anesthesia Care Procedure:                Pre-Anesthesia Assessment:                           - Prior to the procedure, a History and Physical                            was performed, and patient medications and                            allergies were reviewed. The patient's tolerance of                            previous anesthesia was also reviewed. The risks                            and benefits of the procedure and the sedation                            options and risks were discussed with the patient.                            All questions were answered, and informed consent                            was obtained. Prior Anticoagulants: The patient has                            taken no previous anticoagulant or antiplatelet                            agents. ASA Grade Assessment: II - A patient with                            mild systemic disease. After reviewing the risks                            and benefits, the patient was deemed in                            satisfactory condition to undergo the procedure.                           After obtaining informed consent, the endoscope was  passed under direct vision. Throughout the                            procedure, the patient's blood pressure, pulse, and                            oxygen saturations were monitored continuously. The                            Endoscope was introduced through the mouth, and                            advanced to the second part of duodenum. The upper                            GI endoscopy was  accomplished without difficulty.                            The patient tolerated the procedure well. Scope In: Scope Out: Findings:                 The esophagus was normal.                           The stomach was normal.                           The examined duodenum was normal.                           The exam was otherwise without abnormality.                           The scope was withdrawn. Dilation was performed in                            the entire esophagus with a Maloney dilator with                            mild resistance at 54 Fr. Estimated blood loss:                            none. reinspected Complications:            No immediate complications. Estimated Blood Loss:     Estimated blood loss: none. Impression:               - Normal esophagus.                           - Normal stomach.                           - Normal examined duodenum.                           - The examination was otherwise normal.                           -  No specimens collected. Recommendation:           - Patient has a contact number available for                            emergencies. The signs and symptoms of potential                            delayed complications were discussed with the                            patient. Return to normal activities tomorrow.                            Written discharge instructions were provided to the                            patient.                           - Clear liquids x 1 hour then soft foods rest of                            day. Start prior diet tomorrow.                           - Continue present medications.                           - After taking esomeprazole twice a day for 2                            months try going back to 1x a day in AM Gatha Mayer, MD 10/22/2016 10:18:17 AM This report has been signed electronically.

## 2016-10-22 NOTE — Patient Instructions (Addendum)
This looked ok - I did go ahead and dilate the esophagus.  After 2 months of twice a day esomeprazole reduce to 1x a day and see how you do.  I appreciate the opportunity to care for you. Gatha Mayer, MD, FACG  YOU HAD AN ENDOSCOPIC PROCEDURE TODAY AT [facility]:   Refer to the procedure report that was given to you for any specific questions about what was found during the examination.  If the procedure report does not answer your questions, please call your gastroenterologist to clarify.  If you requested that your care partner not be given the details of your procedure findings, then the procedure report has been included in a sealed envelope for you to review at your convenience later.  YOU SHOULD EXPECT: Some feelings of bloating in the abdomen. Passage of more gas than usual.  Walking can help get rid of the air that was put into your GI tract during the procedure and reduce the bloating. If you had a lower endoscopy (such as a colonoscopy or flexible sigmoidoscopy) you may notice spotting of blood in your stool or on the toilet paper. If you underwent a bowel prep for your procedure, you may not have a normal bowel movement for a few days.  Please Note:  You might notice some irritation and congestion in your nose or some drainage.  This is from the oxygen used during your procedure.  There is no need for concern and it should clear up in a day or so.  SYMPTOMS TO REPORT IMMEDIATELY:    Following upper endoscopy (EGD)  Vomiting of blood or coffee ground material  New chest pain or pain under the shoulder blades  Painful or persistently difficult swallowing  New shortness of breath  Fever of 100F or higher  Black, tarry-looking stools  For urgent or emergent issues, a gastroenterologist can be reached at any hour by calling [phone].   DIET:  We do recommend a small meal at first, but then you may proceed to your regular diet.  Drink plenty of  fluids but you should avoid alcoholic beverages for 24 hours.  ACTIVITY:  You should plan to take it easy for the rest of today and you should NOT DRIVE or use heavy machinery until tomorrow (because of the sedation medicines used during the test).    FOLLOW UP: Our staff will call the number listed on your records the next business day following your procedure to check on you and address any questions or concerns that you may have regarding the information given to you following your procedure. If we do not reach you, we will leave a message.  However, if you are feeling well and you are not experiencing any problems, there is no need to return our call.  We will assume that you have returned to your regular daily activities without incident.  If any biopsies were taken you will be contacted by phone or by letter within the next 1-3 weeks.  Please call us at [phone] if you have not heard about the biopsies in 3 weeks.    SIGNATURES/CONFIDENTIALITY: You and/or your care partner have signed paperwork which will be entered into your electronic medical record.  These signatures attest to the fact that that the information above on your After Visit Summary has been reviewed and is understood.  Full responsibility of the confidentiality of this discharge information lies with you and/or your care-partner.  Stenosis and after  diation diet given with instructions.

## 2016-10-22 NOTE — Progress Notes (Signed)
Report to PACU, RN, vss, BBS= Clear.  

## 2016-10-22 NOTE — Progress Notes (Signed)
Pt. States she is nauseated. Has not vomited. Passing gas. Does not want to get up to bathroom because she has vertigo And thinks this will make nausea worse.  Will rest in bed for now.

## 2016-10-25 ENCOUNTER — Telehealth: Payer: Self-pay | Admitting: *Deleted

## 2016-10-25 ENCOUNTER — Telehealth: Payer: Self-pay

## 2016-10-25 NOTE — Telephone Encounter (Signed)
Number identifier. Left voicemail, will call back later today.

## 2016-10-25 NOTE — Telephone Encounter (Signed)
  Follow up Call-  Call back number 10/22/2016  Post procedure Call Back phone  # [phone]  Permission to leave phone message Yes  Some recent data might be hidden     Patient questions:  Do you have a fever, pain , or abdominal swelling? Yes.   Pain Score  1 *  Have you tolerated food without any problems? No. Try to eat a soft diet. States crunchy foods scratch going down.  Have you been able to return to your normal activities? Yes.    Do you have any questions about your discharge instructions: Diet   Yes.   Medications  No. Follow up visit  No.  Do you have questions or concerns about your Care? Yes.    Actions: * If pain score is 4 or above: No action needed, pain <4.

## 2016-10-25 NOTE — Telephone Encounter (Deleted)
Number identifier.Left a voicemail, will call back later today. Follow up Call-  Call back number 10/22/2016  Post procedure Call Back phone  # [phone]  Permission to leave phone message Yes  Some recent data might be hidden     Patient questions:  Do you have a fever, pain , or abdominal swelling? {yes no:314532} Pain Score  {NUMBERS; 0-10:5044} *  Have you tolerated food without any problems? {yes no:314532}  Have you been able to return to your normal activities? {yes no:314532}  Do you have any questions about your discharge instructions: Diet   {yes no:314532} Medications  {yes no:314532} Follow up visit  {yes no:314532}  Do you have questions or concerns about your Care? {yes no:314532}  Actions: * If pain score is 4 or above: {ACTION; [facility] ENDO PAIN >4:21563::"No action needed, pain <4."}

## 2016-11-13 ENCOUNTER — Other Ambulatory Visit: Payer: Self-pay | Admitting: Internal Medicine

## 2016-11-15 ENCOUNTER — Other Ambulatory Visit: Payer: Self-pay | Admitting: Obstetrics and Gynecology

## 2016-11-15 DIAGNOSIS — Z1231 Encounter for screening mammogram for malignant neoplasm of breast: Secondary | ICD-10-CM

## 2016-11-18 ENCOUNTER — Other Ambulatory Visit: Payer: Self-pay | Admitting: Internal Medicine

## 2016-11-30 ENCOUNTER — Ambulatory Visit: Payer: Medicare Other

## 2016-12-20 ENCOUNTER — Other Ambulatory Visit: Payer: Self-pay | Admitting: Internal Medicine

## 2016-12-20 NOTE — Telephone Encounter (Signed)
Pt called looking for an update, at pharmacy now. Please advise, thank you!  Call pt @ [phone]

## 2016-12-20 NOTE — Telephone Encounter (Signed)
Pt is requesting to have this refilled today  Pt is currently out of this medication  Pt contact [phone]

## 2017-01-16 ENCOUNTER — Telehealth: Payer: Self-pay | Admitting: Internal Medicine

## 2017-01-17 NOTE — Telephone Encounter (Signed)
Last filled on 07/13/2016 with #31 with 5 refills, no scheduled follow up visit, please advise. thanks

## 2017-01-18 MED ORDER — ALPRAZOLAM 0.5 MG PO TABS: ORAL_TABLET | ORAL | 0 refills | Status: DC

## 2017-01-18 NOTE — Telephone Encounter (Addendum)
Ok to refill for 30 days , printed

## 2017-01-18 NOTE — Addendum Note (Signed)
Addended by: Crecencio Mc on: 01/18/2017 02:07 PM   Modules accepted: Orders

## 2017-01-18 NOTE — Telephone Encounter (Signed)
Please advise 

## 2017-01-18 NOTE — Telephone Encounter (Signed)
Printed, signed and faxed.  

## 2017-01-18 NOTE — Telephone Encounter (Signed)
Pt called requesting this refill. She just got off the phone with the pharmacy and they stated that they tried to call our office 6 times on Friday and they got a response back stating that we are denying it and that there is no reason as to why she needs this medication. Pt has been out of medication since Saturday.Pt was very upset and states that this is the only medication that helps her sleep. I scheduled pt for an appt for 8/27 @ 5:30. Please advise, thank you!  Call pt @ [phone]  pharmacy - [id number] - [place], [place]

## 2017-01-31 ENCOUNTER — Ambulatory Visit (INDEPENDENT_AMBULATORY_CARE_PROVIDER_SITE_OTHER): Payer: Medicare Other | Admitting: Internal Medicine

## 2017-01-31 VITALS — BP 138/84 | HR 58 | Temp 98.7°F

## 2017-01-31 DIAGNOSIS — F411 Generalized anxiety disorder: Secondary | ICD-10-CM

## 2017-01-31 DIAGNOSIS — K2100 Gastro-esophageal reflux disease with esophagitis, without bleeding: Secondary | ICD-10-CM

## 2017-01-31 DIAGNOSIS — R1310 Dysphagia, unspecified: Secondary | ICD-10-CM | POA: Diagnosis not present

## 2017-01-31 DIAGNOSIS — M542 Cervicalgia: Secondary | ICD-10-CM | POA: Diagnosis not present

## 2017-01-31 DIAGNOSIS — E669 Obesity, unspecified: Secondary | ICD-10-CM | POA: Diagnosis not present

## 2017-01-31 DIAGNOSIS — E782 Mixed hyperlipidemia: Secondary | ICD-10-CM

## 2017-01-31 DIAGNOSIS — E559 Vitamin D deficiency, unspecified: Secondary | ICD-10-CM | POA: Diagnosis not present

## 2017-01-31 DIAGNOSIS — E041 Nontoxic single thyroid nodule: Secondary | ICD-10-CM | POA: Diagnosis not present

## 2017-01-31 DIAGNOSIS — R1319 Other dysphagia: Secondary | ICD-10-CM

## 2017-01-31 MED ORDER — ALPRAZOLAM 0.5 MG PO TABS: ORAL_TABLET | ORAL | 5 refills | Status: DC

## 2017-01-31 NOTE — Progress Notes (Signed)
Pre visit review using our clinic review tool, if applicable. No additional management support is needed unless otherwise documented below in the visit note. 

## 2017-01-31 NOTE — Progress Notes (Signed)
Subjective:  Patient ID: Tracey Wiley, female    DOB: 10-Apr-1956  Age: 61 y.o. MRN: [id number]  CC: The primary encounter diagnosis was Thyroid cyst. Diagnoses of Mixed hyperlipidemia, Vitamin D deficiency, Obesity (BMI 30-39.9), Gastro-esophageal reflux disease with esophagitis, Cervicalgia, Esophageal dysphagia, and Generalized anxiety disorder were also pertinent to this visit.  HPI [name] presents for follow up on GAD managed with xanax,  Obesity , dysphagia and joint pain,.   Currently on nutrisystem diet . Lost 18 lbs,  Then gained 6 back when she stopped it due to vinnie's legal  troubles and hasn't been able to get back on it.   Several months ago she  purchased a back  brace that forces back into extension (no slouching) . Started having  increased headaches and neck crepitus which have improved but not resolved once she stopped using the brace.  Was also wearing a new pair of orthotics that  she purchased online and believes these are also to blame because of the way it changed her gait. Has stopped both the back brace and the [employer]ion[employer] orthotics ,  Currently only wearing the spencos   Using alprazolam for insomnia.  Wakes up in pain every morning with a neckache.  Resolved if she takes 200 mg ibuprofen at bedtime.   She has no history of ulcer.  Se is taking nexim 40 mg twice daily for esophageal stricture found on recent endoscopy done to investigate dysphagia not seen on barium swallow study      History of thyroid cyst.  Used to see Endicrinology,  First  Dr. Renne Crigler for thyroid cyst.  Had ultrasound by [facility] which noted a second cyst .  No ultrasound in the last several yers.   Outpatient Medications Prior to Visit  Medication Sig Dispense Refill  . acetaminophen (TYLENOL) 500 MG tablet Take 500 mg by mouth daily as needed for pain.    Marland Kitchen albuterol (PROVENTIL HFA;VENTOLIN HFA) 108 (90 Base) MCG/ACT inhaler Inhale 2 puffs into the lungs every 6 (six) hours as needed for  wheezing or shortness of breath. 1 Inhaler 0  . amoxicillin (AMOXIL) 500 MG capsule TAKE 4 CAPSULES BY MOUTH 1 HOUR PRIOR TO PROCEDURE 4 capsule 2  . aspirin EC 81 MG tablet Take 81 mg by mouth daily.    . calcium-vitamin D 250-100 MG-UNIT tablet Take 1 tablet by mouth 2 (two) times daily.    . chlorhexidine (PERIDEX) 0.12 % solution GARGLE AND SPIT 15 MLS IN THE MOUTH OR THROAT 2 (TWO) TIMES DAILY. (Patient taking differently: GARGLE AND SPIT 15 MLS IN THE MOUTH OR THROAT PRN.) 473 mL 0  . colchicine 0.6 MG tablet Take 1 tablet (0.6 mg total) by mouth 2 (two) times daily as needed. 30 tablet 1  . Echinacea 125 MG CAPS Take by mouth.    . esomeprazole (NEXIUM) 40 MG capsule Take 1 capsule (40 mg total) by mouth 2 (two) times daily before a meal. 180 capsule 3  . furosemide (LASIX) 20 MG tablet Take 1 tablet (20 mg total) by mouth daily as needed for edema. 90 tablet 3  . hyoscyamine (LEVSIN SL) 0.125 MG SL tablet Place 1 tablet (0.125 mg total) under the tongue every 4 (four) hours as needed for cramping. 60 tablet 3  . ibuprofen (ADVIL,MOTRIN) 200 MG tablet Take by mouth as needed.     . lidocaine (LIDODERM) 5 % Place 1 patch onto the skin as needed. Remove & Discard patch within 12  hours or as directed by MD    . magnesium gluconate (MAGONATE) 500 MG tablet Take 500 mg by mouth 2 (two) times daily.    . potassium chloride (K-DUR) 10 MEQ tablet Take 1 tablet (10 mEq total) by mouth daily as needed (With Lasix). 90 tablet 3  . simvastatin (ZOCOR) 40 MG tablet Take 1 tablet (40 mg total) by mouth daily. 90 tablet 3  . sotalol (BETAPACE) 80 MG tablet Take 1 tablet (80 mg total) by mouth 2 (two) times daily. 180 tablet 3  . traMADol (ULTRAM) 50 MG tablet Take 1 tablet (50 mg total) by mouth every 8 (eight) hours as needed. 90 tablet 3  . triamterene-hydrochlorothiazide (DYAZIDE) 37.5-25 MG capsule Take 1 each (1 capsule total) by mouth daily. 90 capsule 3  . valACYclovir (VALTREX) 1000 MG tablet TAKE  1 TABLET BY MOUTH DAILY FOR SUPPRESSION , OR 5 TIMES DAILY FOR FLARE 35 tablet 2  . vitamin C (ASCORBIC ACID) 500 MG tablet Take 500 mg by mouth daily.    Marland Kitchen ALPRAZolam (XANAX) 0.5 MG tablet TAKE 1 TABLET BY MOUTH AT BEDTIME OR NEEDED FOR ANXIETY 31 tablet 0   No facility-administered medications prior to visit.     Review of Systems;  Patient denies headache, fevers, malaise, unintentional weight loss, skin rash, eye pain, sinus congestion and sinus pain, sore throat, dysphagia,  hemoptysis , cough, dyspnea, wheezing, chest pain, palpitations, orthopnea, edema, abdominal pain, nausea, melena, diarrhea, constipation, flank pain, dysuria, hematuria, urinary  Frequency, nocturia, numbness, tingling, seizures,  Focal weakness, Loss of consciousness,  Tremor, insomnia, depression, anxiety, and suicidal ideation.      Objective:  BP 138/84   Pulse (!) 58   Temp 98.7 F (37.1 C) (Oral)   BP Readings from Last 3 Encounters:  01/31/17 138/84  10/22/16 (!) 94/54  09/07/16 128/78    Wt Readings from Last 3 Encounters:  10/22/16 227 lb (103 kg)  09/07/16 225 lb 8 oz (102.3 kg)  09/02/16 227 lb 6 oz (103.1 kg)    General appearance: alert, cooperative and appears stated age Ears: normal TM's and external ear canals both ears Throat: lips, mucosa, and tongue normal; teeth and gums normal Neck: no adenopathy, no carotid bruit, supple, symmetrical, trachea midline and thyroid not enlarged, symmetric, no tenderness/mass/nodules Back: symmetric, no curvature. ROM normal. No CVA tenderness. Lungs: clear to auscultation bilaterally Heart: regular rate and rhythm, S1, S2 normal, no murmur, click, rub or gallop Abdomen: soft, non-tender; bowel sounds normal; no masses,  no organomegaly Pulses: 2+ and symmetric Skin: Skin color, texture, turgor normal. No rashes or lesions Lymph nodes: Cervical, supraclavicular, and axillary nodes normal.  Lab Results  Component Value Date   HGBA1C 6.3  07/25/2014   HGBA1C 6.1 12/19/2012    Lab Results  Component Value Date   CREATININE 0.81 05/14/2016   CREATININE 0.72 07/31/2015   CREATININE 0.80 12/17/2014    Lab Results  Component Value Date   WBC 8.2 05/14/2016   HGB 12.3 05/14/2016   HCT 37.2 05/14/2016   PLT 352.0 05/14/2016   GLUCOSE 114 (H) 05/14/2016   CHOL 153 07/31/2015   TRIG 166 (H) 07/31/2015   HDL 38 (L) 07/31/2015   LDLCALC 82 07/31/2015   ALT 13 05/14/2016   AST 19 05/14/2016   NA 140 05/14/2016   K 3.9 05/14/2016   CL 101 05/14/2016   CREATININE 0.81 05/14/2016   BUN 19 05/14/2016   CO2 32 05/14/2016   TSH 1.81 05/14/2016  HGBA1C 6.3 07/25/2014    Dg Esophagus  Result Date: 08/30/2016 CLINICAL DATA:  61 year old female with dysphagia. Difficulty with pills and solids. Prior endoscopies with dilation. Initial encounter. EXAM: ESOPHOGRAM / BARIUM SWALLOW / BARIUM TABLET STUDY TECHNIQUE: Combined double contrast and single contrast examination performed using effervescent crystals, thick barium liquid, and thin barium liquid. The patient was observed with fluoroscopy swallowing a 13 mm barium sulphate tablet. FLUOROSCOPY TIME:  Fluoroscopy Time:  54 seconds. Radiation Exposure Index (if provided by the fluoroscopic device): 95.4 mGy COMPARISON:  07/10/2013 chest CT. FINDINGS: No laryngeal penetration or aspiration. No esophageal obstructing lesion. Mild tertiary wave contractions noted during exam. 13 mm barium tablet becomes temporarily lodged at the distal esophagus level although clears immediately upon subsequent swallowing. Mild smooth narrowing at this level. No worrisome mucosal abnormality noted. No reflux demonstrated. Post patent ovale patch. IMPRESSION: 13 mm barium tablet becomes temporarily lodged at the distal esophagus level although clears immediately upon subsequent swallowing. Mild smooth narrowing at this level. Mild tertiary wave contractions noted throughout the exam. No gastroesophageal  reflux occurred during exam. Electronically Signed   By: Genia Del M.D.   On: 08/30/2016 11:59    Assessment & Plan:   Problem List Items Addressed This Visit    Cervicalgia    Managed with low dose ibuprofen, recently aggravated by back brace causing forced extension. her MRI of cervical spine in 2014 showed mild facet hypertrophy at C5 and trace anterior listhesis of C3 on C4.       Dysphagia    Secondary to esophageal stricture noted on recent EGD .improving       Gastro-esophageal reflux disease with esophagitis    Continue nexium 40 mg bid for stricture noted on recent EGD      Generalized anxiety disorder    Managed with alprazolam for insomniA      Hyperlipidemia    Low HDL,  LDL < 100.  MANAGED WITH simvastatin.she is due for follow up labs.  Ordered today  Lab Results  Component Value Date   CHOL 153 07/31/2015   HDL 38 (L) 07/31/2015   LDLCALC 82 07/31/2015   TRIG 166 (H) 07/31/2015   CHOLHDL 4.0 07/31/2015         Relevant Orders   Comprehensive metabolic panel   Lipid panel   Obesity (BMI 30-39.9)    I have addressed  BMI and recommended wt loss of 10% of body weigh over the next 6 months using a low glycemic index diet and regular exercise a minimum of 5 days per week.        Thyroid cyst - Primary    Nonpalpable today. Ultrasound ordered for follow up       Relevant Orders   US Soft Tissue Head/Neck   TSH   Vitamin D deficiency   Relevant Orders   VITAMIN D 25 Hydroxy (Vit-D Deficiency, Fractures)     A total of 25 minutes of face to face time was spent with patient more than half of which was spent in counselling about the above mentioned conditions  and coordination of care  I am having Ms. Creegan maintain her acetaminophen, aspirin EC, chlorhexidine, lidocaine, albuterol, ibuprofen, calcium-vitamin D, hyoscyamine, traMADol, colchicine, triamterene-hydrochlorothiazide, simvastatin, sotalol, furosemide, potassium chloride, esomeprazole,  vitamin C, Echinacea, magnesium gluconate, amoxicillin, valACYclovir, and ALPRAZolam.  Meds ordered this encounter  Medications  . ALPRAZolam (XANAX) 0.5 MG tablet    Sig: TAKE 1 TABLET BY MOUTH AT BEDTIME OR NEEDED FOR ANXIETY  Dispense:  31 tablet    Refill:  5    Not to exceed 2 additional fills before 09/08/2016    Medications Discontinued During This Encounter  Medication Reason  . ALPRAZolam (XANAX) 0.5 MG tablet Reorder    Follow-up: No Follow-up on file.   Crecencio Mc, MD

## 2017-01-31 NOTE — Patient Instructions (Addendum)
Ok to add one Advil at bedtime for your neck   Thyroid ultrasound has been ordered

## 2017-02-01 LAB — COMPREHENSIVE METABOLIC PANEL
ALT: 10 U/L (ref 0–35)
AST: 16 U/L (ref 0–37)
Albumin: 4 g/dL (ref 3.5–5.2)
Alkaline Phosphatase: 60 U/L (ref 39–117)
BILIRUBIN TOTAL: 0.4 mg/dL (ref 0.2–1.2)
BUN: 16 mg/dL (ref 6–23)
CALCIUM: 9.2 mg/dL (ref 8.4–10.5)
CHLORIDE: 99 meq/L (ref 96–112)
CO2: 32 meq/L (ref 19–32)
CREATININE: 0.82 mg/dL (ref 0.40–1.20)
GFR: 75.82 mL/min (ref >60.00)
GLUCOSE: 89 mg/dL (ref 70–99)
Potassium: 3.9 mEq/L (ref 3.5–5.1)
SODIUM: 137 meq/L (ref 135–145)
Total Protein: 7.3 g/dL (ref 6.0–8.3)

## 2017-02-01 LAB — LIPID PANEL
Cholesterol: 161 mg/dL (ref 0–200)
HDL: 35.9 mg/dL — ABNORMAL LOW (ref >39.00)
NONHDL: 125.37
TRIGLYCERIDES: 312 mg/dL — AB (ref 0.0–149.0)
Total CHOL/HDL Ratio: 4
VLDL: 62.4 mg/dL — ABNORMAL HIGH (ref 0.0–40.0)

## 2017-02-01 LAB — VITAMIN D 25 HYDROXY (VIT D DEFICIENCY, FRACTURES): VITD: 28.86 ng/mL — AB (ref 30.00–100.00)

## 2017-02-01 LAB — TSH: TSH: 2.74 u[IU]/mL (ref 0.35–4.50)

## 2017-02-01 LAB — LDL CHOLESTEROL, DIRECT: Direct LDL: 88 mg/dL

## 2017-02-01 NOTE — Assessment & Plan Note (Signed)
Managed with low dose ibuprofen, recently aggravated by back brace causing forced extension. her MRI of cervical spine in 2014 showed mild facet hypertrophy at C5 and trace anterior listhesis of C3 on C4.

## 2017-02-01 NOTE — Assessment & Plan Note (Signed)
I have addressed  BMI and recommended wt loss of 10% of body weigh over the next 6 months using a low glycemic index diet and regular exercise a minimum of 5 days per week.   

## 2017-02-01 NOTE — Assessment & Plan Note (Signed)
Managed with alprazolam for insomniA

## 2017-02-01 NOTE — Assessment & Plan Note (Signed)
Continue nexium 40 mg bid for stricture noted on recent EGD

## 2017-02-01 NOTE — Assessment & Plan Note (Signed)
Nonpalpable today. Ultrasound ordered for follow up

## 2017-02-01 NOTE — Assessment & Plan Note (Addendum)
Low HDL,  LDL < 100.  MANAGED WITH simvastatin.she is due for follow up labs.  Ordered today  Lab Results  Component Value Date   CHOL 153 07/31/2015   HDL 38 (L) 07/31/2015   LDLCALC 82 07/31/2015   TRIG 166 (H) 07/31/2015   CHOLHDL 4.0 07/31/2015

## 2017-02-01 NOTE — Assessment & Plan Note (Signed)
Secondary to esophageal stricture noted on recent EGD .improving

## 2017-02-08 ENCOUNTER — Ambulatory Visit
Admission: RE | Admit: 2017-02-08 | Discharge: 2017-02-08 | Disposition: A | Discharge status: Home / Self Care | Payer: Medicare Other | Source: Ambulatory Visit | Attending: Internal Medicine | Admitting: Internal Medicine

## 2017-02-08 ENCOUNTER — Ambulatory Visit: Admission: RE | Admit: 2017-02-08 | Payer: Medicare Other | Source: Ambulatory Visit

## 2017-02-08 DIAGNOSIS — E041 Nontoxic single thyroid nodule: Secondary | ICD-10-CM | POA: Diagnosis present

## 2017-02-14 ENCOUNTER — Telehealth: Payer: Self-pay

## 2017-02-14 NOTE — Telephone Encounter (Signed)
Pt called back this morning stating that she had given [place] some wrong information on how much vitamin d she has been taking. The pt stated that she is currently taking 3,000uints daily, pt states that she has been doing this for about a year now. Pt's lab work stated that her vitamin d level was low and that she would need to increase her vitamin to 2,00units daly during the winter months. Please advise.

## 2017-02-14 NOTE — Telephone Encounter (Signed)
Increase vitamin D3 dose to 5000 units daily through march , then decrease to 3000 units

## 2017-02-15 NOTE — Telephone Encounter (Signed)
Pt called back returning your call. Please advise, thank you!  Call pt @ [phone]

## 2017-02-15 NOTE — Telephone Encounter (Signed)
LMTCB

## 2017-02-16 ENCOUNTER — Other Ambulatory Visit: Payer: Self-pay | Admitting: Internal Medicine

## 2017-02-16 DIAGNOSIS — E041 Nontoxic single thyroid nodule: Secondary | ICD-10-CM

## 2017-02-16 NOTE — Telephone Encounter (Signed)
LMTCB

## 2017-02-16 NOTE — Telephone Encounter (Signed)
Pt was notified of the medication changes stated by Dr. Derrel Nip below. Pt gave a verbal understanding.

## 2017-03-21 ENCOUNTER — Ambulatory Visit (INDEPENDENT_AMBULATORY_CARE_PROVIDER_SITE_OTHER): Payer: Medicare Other

## 2017-03-21 DIAGNOSIS — Z23 Encounter for immunization: Secondary | ICD-10-CM | POA: Diagnosis not present

## 2017-04-04 ENCOUNTER — Encounter: Payer: Self-pay | Admitting: Endocrinology

## 2017-04-04 ENCOUNTER — Ambulatory Visit (INDEPENDENT_AMBULATORY_CARE_PROVIDER_SITE_OTHER): Payer: Medicare Other | Admitting: Endocrinology

## 2017-04-04 VITALS — BP 130/82 | HR 67 | Wt 219.0 lb

## 2017-04-04 DIAGNOSIS — E236 Other disorders of pituitary gland: Secondary | ICD-10-CM | POA: Diagnosis not present

## 2017-04-04 DIAGNOSIS — E669 Obesity, unspecified: Secondary | ICD-10-CM | POA: Diagnosis not present

## 2017-04-04 LAB — BASIC METABOLIC PANEL
BUN: 16 mg/dL (ref 6–23)
CO2: 31 meq/L (ref 19–32)
Calcium: 9.4 mg/dL (ref 8.4–10.5)
Chloride: 101 mEq/L (ref 96–112)
Creatinine, Ser: 0.72 mg/dL (ref 0.40–1.20)
GFR: 88.04 mL/min (ref >60.00)
GLUCOSE: 109 mg/dL — AB (ref 70–99)
POTASSIUM: 3.8 meq/L (ref 3.5–5.1)
SODIUM: 139 meq/L (ref 135–145)

## 2017-04-04 LAB — CORTISOL
Cortisol, Plasma: 3.6 ug/dL
Cortisol, Plasma: 19.4 ug/dL

## 2017-04-04 MED ORDER — COSYNTROPIN NICU IV SYRINGE 0.25 MG/ML (STANDARD DOSE)
0.2500 mg | Freq: Once | INTRAVENOUS | Status: AC
  Administered 2017-04-04: 0.25 mg via INTRAVENOUS
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
## 2017-04-04 NOTE — Patient Instructions (Addendum)
All you need for the thyroid is to have [name] examine your neck each year, and the annual thyroid blood test. Let;s check the MRI of the pituitary.  Please see a specialist.  you will receive a phone call, about a day and time for an appointment. blood tests are requested for you today.  We'll let you know about the results.   I would be happy to see you back here as needed.

## 2017-05-19 ENCOUNTER — Other Ambulatory Visit: Payer: Self-pay | Admitting: Internal Medicine

## 2017-05-19 ENCOUNTER — Telehealth: Payer: Self-pay | Admitting: Internal Medicine

## 2017-05-19 NOTE — Telephone Encounter (Signed)
Pt asking for Amoxicillin for 3 crowns that are going to be done this year. Pt wants enough prescribed to cover for the 3 dental visits. Pt has 1st visit next week.

## 2017-05-19 NOTE — Telephone Encounter (Unsigned)
Copied from [place]. Topic: Quick Communication - Rx Refill/Question >> May 19, 2017  4:27 PM Neva Seat wrote: Has the patient contacted their pharmacy? yes  Preferred Pharmacy (with phone number or street name):  [facility] [place] [phone]  Pt is needing 3 crowns done this year.  Pt needs Amoxicillin approved and enough  for 3 dental visit in the next coming week.  Pt is out of medication

## 2017-05-20 MED ORDER — AMOXICILLIN 500 MG PO CAPS: ORAL_CAPSULE | ORAL | 0 refills | Status: DC

## 2017-05-20 NOTE — Telephone Encounter (Signed)
rx for 12 amox sent

## 2017-05-20 NOTE — Telephone Encounter (Signed)
Please advise 

## 2017-05-20 NOTE — Telephone Encounter (Signed)
Refilled: 11/19/2016 Last OV: 01/31/2017 Next OV: not scheduled

## 2017-05-20 NOTE — Telephone Encounter (Signed)
Patient aware.

## 2017-06-16 ENCOUNTER — Telehealth: Payer: Self-pay

## 2017-06-16 NOTE — Telephone Encounter (Signed)
Left patient message to call me back.  I need to confirm how she is taking her esomeprazole 40mg  capsules currently.  She had an EGD in May and was suppose to take it BID for 2 months and then try to go back to one daily.  [facility] saying that her insurance only covers once daily.

## 2017-06-17 MED ORDER — ESOMEPRAZOLE MAGNESIUM 40 MG PO CPDR: 40.0000 mg | DELAYED_RELEASE_CAPSULE | Freq: Every day | ORAL | 3 refills | Status: DC

## 2017-06-17 NOTE — Telephone Encounter (Signed)
rx for once daily esomeprazole sent in.

## 2017-06-17 NOTE — Telephone Encounter (Signed)
Please advise Sir, thank you. 

## 2017-06-17 NOTE — Telephone Encounter (Signed)
I think she can take it qd  I do not think she has to take bid UNLESS qd is not controlling her GERD sxs  Can refill at qd x 1 year

## 2017-06-17 NOTE — Telephone Encounter (Signed)
Left a detailed message of what Dr Carlean Purl advised and told her I would wait a few minutes to send in her rx to see if she has any questions.

## 2017-06-17 NOTE — Telephone Encounter (Signed)
Patient states that she is taking two pills daily. She says that her pcp told her to keep taking two daily because she is taking advil at night. She says that she will run out of this medication on Sunday.

## 2017-06-17 NOTE — Telephone Encounter (Signed)
Patient returned phone call. She says that [facility] is sitting [facility] now and is requesting that our office send rx in

## 2017-06-27 ENCOUNTER — Other Ambulatory Visit: Payer: Self-pay | Admitting: Internal Medicine

## 2017-06-27 NOTE — Telephone Encounter (Signed)
Refilled: 05/20/2017 Last OV: 01/31/2017 Next OV: not scheduled  For dental work

## 2017-06-27 NOTE — Telephone Encounter (Signed)
Copied from [place] [phone]. Topic: Quick Communication - Rx Refill/Question >> Jun 27, 2017  9:56 AM Oliver Pila B wrote: Medication: amoxicillin (AMOXIL) 500 MG capsule [[id number]] Has the patient contacted their pharmacy? Yes.   (Agent: If no, request that the patient contact the pharmacy for the refill.) Preferred Pharmacy (with phone number [facility] street [facility] b/c pt was told by dentist that she would need to self medicate due to the fact the her impressions were broken and they will need her to come back into the the dentist office and go thru the whole procedure again, the dentist office is [place] [phone] contact to confirm if needed, or contact pt

## 2017-08-16 ENCOUNTER — Other Ambulatory Visit: Payer: Self-pay | Admitting: Internal Medicine

## 2017-08-17 ENCOUNTER — Other Ambulatory Visit: Payer: Self-pay | Admitting: Internal Medicine

## 2017-08-17 NOTE — Telephone Encounter (Signed)
Refilled: 01/31/2017 Last OV: 01/31/2017 Next OV: not scheduled

## 2017-08-17 NOTE — Telephone Encounter (Signed)
Please notify patient that the prescription  was Refilled for 30 days AS A COURTESY  because it has been 9 months since last visit. Marland Kitchen  OFFICE VISIT NEEDED prior to any more refills

## 2017-08-23 NOTE — Telephone Encounter (Signed)
Scheduled pt for an appt on April 30th 2019. Pt is aware of appt date and time.

## 2017-08-31 ENCOUNTER — Other Ambulatory Visit: Payer: Self-pay | Admitting: Cardiovascular Disease

## 2017-09-17 ENCOUNTER — Other Ambulatory Visit: Payer: Self-pay | Admitting: Cardiovascular Disease

## 2017-09-18 ENCOUNTER — Other Ambulatory Visit: Payer: Self-pay | Admitting: Internal Medicine

## 2017-09-19 ENCOUNTER — Other Ambulatory Visit: Payer: Self-pay | Admitting: Internal Medicine

## 2017-09-19 NOTE — Telephone Encounter (Signed)
Refilled: 08/17/2017 Last OV: 01/31/2017 Next OV: 10/04/2017

## 2017-09-20 NOTE — Telephone Encounter (Signed)
Printed, signed and faxed.  

## 2017-10-04 ENCOUNTER — Ambulatory Visit: Payer: Medicare HMO | Admitting: Internal Medicine

## 2017-10-04 ENCOUNTER — Encounter: Payer: Self-pay | Admitting: Internal Medicine

## 2017-10-04 VITALS — BP 122/70 | HR 68 | Temp 97.9°F | Resp 14 | Ht 64.0 in | Wt 223.0 lb

## 2017-10-04 DIAGNOSIS — S39012A Strain of muscle, fascia and tendon of lower back, initial encounter: Secondary | ICD-10-CM

## 2017-10-04 DIAGNOSIS — E236 Other disorders of pituitary gland: Secondary | ICD-10-CM

## 2017-10-04 DIAGNOSIS — E669 Obesity, unspecified: Secondary | ICD-10-CM | POA: Diagnosis not present

## 2017-10-04 DIAGNOSIS — R7301 Impaired fasting glucose: Secondary | ICD-10-CM

## 2017-10-04 MED ORDER — DIAZEPAM 10 MG PO TABS
ORAL_TABLET | ORAL | 0 refills | Status: DC
Start: 2017-10-04 — End: 2017-12-05

## 2017-10-04 MED ORDER — CYCLOBENZAPRINE HCL 10 MG PO TABS: 10.0000 mg | ORAL_TABLET | Freq: Three times a day (TID) | ORAL | 0 refills | Status: DC | PRN

## 2017-10-04 MED ORDER — ALPRAZOLAM 0.5 MG PO TABS: 0.5000 mg | ORAL_TABLET | Freq: Every evening | ORAL | 5 refills | Status: DC | PRN

## 2017-10-04 MED ORDER — CELECOXIB 200 MG PO CAPS: 200.0000 mg | ORAL_CAPSULE | Freq: Two times a day (BID) | ORAL | 0 refills | Status: DC

## 2017-10-04 NOTE — Patient Instructions (Signed)
I am prescribing celebrex to use two times daily as needed for muscle spasms  Add the flexeril  And ICE  MRI pituitary has been ordered

## 2017-10-04 NOTE — Progress Notes (Signed)
Subjective:  Patient ID: Tracey Wiley, female    DOB: [birth year]  Age: 62 y.o. 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 Diagnoses of Impaired fasting glucose, Strain of muscle, fascia and tendon of lower back, initial encounter, Pituitary cyst ([place]), and Obesity (BMI 30-39.9) were also pertinent to this visit.  HPI Tracey Wiley presents for follow up on multiple issues   History of thyroid nodule :  Saw Endocrine in October for evaluation of weight  Gain after September ultrasound was negative for  Nodules.   Annual check with me advised  MRI pituitary imaging  Was supposedly ordered by Dr Loanne Drilling but patient was never called.   When she called to find out the status they required a follow up OV to schedule MRI     Weight gain since last visit due to care for dog who had eye surgery for a rapidly growing eye tumor. Complicated by infection  So she dropped the  Nutrisystem low glycemic diet. Had lost weight and weight dropped from 231 down to 206 .  Still eating salad  Salmon ,  Roast chicken,  Oatmeal,  Bran flakes,  Coconut milk, hard boiled eggs.   alprazozlam for anxety   Recent episode of severe  Left sided back pain over SI notch,   resovled with Doterra and light stimulator .  Recurrent in upper back on left side.  Started Friday night . Hurt to inhale ,  Tender area on back     Outpatient Medications Prior to Visit  Medication Sig Dispense Refill  . acetaminophen (TYLENOL) 500 MG tablet Take 500 mg by mouth daily as needed for pain.    Marland Kitchen albuterol (PROVENTIL HFA;VENTOLIN HFA) 108 (90 Base) MCG/ACT inhaler Inhale 2 puffs into the lungs every 6 (six) hours as needed for wheezing or shortness of breath. 1 Inhaler 0  . amoxicillin (AMOXIL) 500 MG capsule TAKE 4 CAPSULES BY MOUTH 1 HOUR PRIOR TO PROCEDURE 12 capsule 0  . aspirin EC 81 MG tablet Take 81 mg by mouth daily.    . calcium-vitamin D 250-100 MG-UNIT tablet Take 1 tablet by  mouth 2 (two) times daily.    . chlorhexidine (PERIDEX) 0.12 % solution GARGLE AND SPIT 15 MLS IN THE MOUTH OR THROAT 2 (TWO) TIMES DAILY. (Patient taking differently: GARGLE AND SPIT 15 MLS IN THE MOUTH OR THROAT PRN.) 473 mL 0  . colchicine 0.6 MG tablet Take 1 tablet (0.6 mg total) by mouth 2 (two) times daily as needed. 30 tablet 1  . Echinacea 125 MG CAPS Take by mouth.    . esomeprazole (NEXIUM) 40 MG capsule Take 1 capsule (40 mg total) by mouth daily. 90 capsule 3  . furosemide (LASIX) 20 MG tablet Take 1 tablet (20 mg total) by mouth daily as needed for edema. 90 tablet 3  . hyoscyamine (LEVSIN SL) 0.125 MG SL tablet Place 1 tablet (0.125 mg total) under the tongue every 4 (four) hours as needed for cramping. 60 tablet 3  . ibuprofen (ADVIL,MOTRIN) 200 MG tablet Take by mouth as needed.     . magnesium gluconate (MAGONATE) 500 MG tablet Take 500 mg by mouth 2 (two) times daily.    . potassium chloride (K-DUR) 10 MEQ tablet Take 1 tablet (10 mEq total) by mouth daily as needed (With Lasix). 90 tablet 3  . sotalol (BETAPACE) 80 MG tablet TAKE 1 TABLET (80 MG TOTAL) BY MOUTH 2 (TWO) TIMES DAILY.  60 tablet 0  . traMADol (ULTRAM) 50 MG tablet Take 1 tablet (50 mg total) by mouth every 8 (eight) hours as needed. 90 tablet 3  . triamterene-hydrochlorothiazide (DYAZIDE) 37.5-25 MG capsule TAKE 1 EACH (1 CAPSULE TOTAL) BY MOUTH DAILY. 90 capsule 3  . valACYclovir (VALTREX) 1000 MG tablet TAKE 1 TABLET BY MOUTH DAILY FOR SUPPRESSION , OR 5 TIMES DAILY FOR FLARE 35 tablet 2  . vitamin C (ASCORBIC ACID) 500 MG tablet Take 500 mg by mouth daily.    Marland Kitchen ZOCOR 40 MG tablet TAKE 1 TABLET (40 MG TOTAL) BY MOUTH DAILY. 90 tablet 3  . ALPRAZolam (XANAX) 0.5 MG tablet TAKE 1 TABLET BY MOUTH AT BEDTIME AS NEEDED FOR ANXIETY 31 tablet 0  . lidocaine (LIDODERM) 5 % Place 1 patch onto the skin as needed. Remove & Discard patch within 12 hours or as directed by MD     No facility-administered medications prior to  visit.     Review of Systems;  Patient denies headache, fevers, malaise, unintentional weight loss, skin rash, eye pain, sinus congestion and sinus pain, sore throat, dysphagia,  hemoptysis , cough, dyspnea, wheezing, chest pain, palpitations, orthopnea, edema, abdominal pain, nausea, melena, diarrhea, constipation, dysuria, hematuria, urinary  Frequency, nocturia, numbness, tingling, seizures,  Focal weakness, Loss of consciousness,  Tremor, insomnia, depression, anxiety, and suicidal ideation.      Objective:  BP 122/70 (BP Location: Left Arm, Patient Position: Sitting, Cuff Size: Normal)   Pulse 68   Temp 97.9 F (36.6 C) (Oral)   Resp 14   Ht 5\' 4"  (1.626 m)   Wt 223 lb (101.2 kg)   SpO2 98%   BMI 38.28 kg/m   BP Readings from Last 3 Encounters:  10/04/17 122/70  04/04/17 130/82  01/31/17 138/84    Wt Readings from Last 3 Encounters:  10/04/17 223 lb (101.2 kg)  04/04/17 219 lb (99.3 kg)  10/22/16 227 lb (103 kg)    General appearance: alert, cooperative and appears stated age Ears: normal TM's and external ear canals both ears Throat: lips, mucosa, and tongue normal; teeth and gums normal Neck: no adenopathy, no carotid bruit, supple, symmetrical, trachea midline and thyroid not enlarged, symmetric, no tenderness/mass/nodules Back: symmetric, no curvature. ROM normal. No CVA tenderness. Lungs: clear to auscultation bilaterally Heart: regular rate and rhythm, S1, S2 normal, no murmur, click, rub or gallop Abdomen: soft, non-tender; bowel sounds normal; no masses,  no organomegaly Pulses: 2+ and symmetric Skin: Skin color, texture, turgor normal. No rashes or lesions Lymph nodes: Cervical, supraclavicular, and axillary nodes normal. MSK: paraspinus muscle spASM AS WELL AS OVER SCIATIC NOTCH  Assessment & Plan:   Problem List Items Addressed This Visit    Strain of muscle, fascia and tendon of lower back, initial encounter    CELEBREX, FLEXERIL AND ICE PRESCRIBED       Pituitary cyst ([place])    MRI ordered.  Her recent weight gain is due to lack of exercise and poor diet.  Prior workup with endocrinology in 2015 showed no evidence through imaging or labs,and was repeated again per patient request, with no new findings  Advised to resume Nutrisyste diet       Obesity (BMI 30-39.9)    Her recent weight gain is due to lack of exercise and poor diet.  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 Counihan's lidocaine. I have also changed her ALPRAZolam. Additionally, I am having her start on celecoxib, cyclobenzaprine, and diazepam. Lastly, I am having her maintain her acetaminophen, aspirin EC, chlorhexidine, albuterol, ibuprofen, calcium-vitamin D, hyoscyamine, traMADol, colchicine, furosemide, potassium chloride, vitamin C, Echinacea, magnesium gluconate, valACYclovir, esomeprazole, amoxicillin, triamterene-hydrochlorothiazide, ZOCOR, and sotalol.  Meds ordered this encounter  Medications  . celecoxib (CELEBREX) 200 MG capsule    Sig: Take 1 capsule (200 mg total) by mouth 2 (two) times daily.    Dispense:  60 capsule    Refill:  0  . cyclobenzaprine (FLEXERIL) 10 MG tablet    Sig: Take 1 tablet (10 mg total) by mouth 3 (three) times daily as needed for muscle spasms.    Dispense:  30 tablet    Refill:  0  . ALPRAZolam (XANAX) 0.5 MG tablet    Sig: Take 1 tablet (0.5 mg total) by mouth at bedtime as needed for anxiety or sleep.    Dispense:  31 tablet    Refill:  5    Not to exceed 5 additional fills before 02/14/2018  . diazepam  (VALIUM) 10 MG tablet    Sig: Take one hour before MRI to prevent agitation    Dispense:  5 tablet    Refill:  0   A total of 25 minutes of face to face time was spent with patient more than half of which was spent in counselling about the above mentioned conditions  and coordination of care  Medications Discontinued During This Encounter  Medication Reason  . lidocaine (LIDODERM) 5 % Patient has not taken in last 30 days  . ALPRAZolam (XANAX) 0.5 MG tablet Reorder    Follow-up: No follow-ups on file.   Crecencio Mc, MD

## 2017-10-05 DIAGNOSIS — S39012A Strain of muscle, fascia and tendon of lower back, initial encounter: Secondary | ICD-10-CM | POA: Insufficient documentation

## 2017-10-05 LAB — COMPREHENSIVE METABOLIC PANEL
ALT: 11 U/L (ref 0–35)
AST: 15 U/L (ref 0–37)
Albumin: 3.8 g/dL (ref 3.5–5.2)
Alkaline Phosphatase: 69 U/L (ref 39–117)
BILIRUBIN TOTAL: 0.4 mg/dL (ref 0.2–1.2)
BUN: 14 mg/dL (ref 6–23)
CO2: 32 meq/L (ref 19–32)
CREATININE: 0.76 mg/dL (ref 0.40–1.20)
Calcium: 8.9 mg/dL (ref 8.4–10.5)
Chloride: 101 mEq/L (ref 96–112)
GFR: 82.58 mL/min (ref >60.00)
GLUCOSE: 111 mg/dL — AB (ref 70–99)
Potassium: 3.8 mEq/L (ref 3.5–5.1)
SODIUM: 139 meq/L (ref 135–145)
Total Protein: 7.2 g/dL (ref 6.0–8.3)

## 2017-10-05 LAB — HEMOGLOBIN A1C: Hgb A1c MFr Bld: 6.4 % (ref 4.6–6.5)

## 2017-10-05 NOTE — Assessment & Plan Note (Addendum)
MRI ordered.  Her recent weight gain is due to lack of exercise and poor diet.  Prior workup with endocrinology in 2015 showed no evidence through imaging or labs,and was repeated again per patient request, with no new findings  Advised to resume [place]

## 2017-10-05 NOTE — Assessment & Plan Note (Signed)
Her recent weight gain is due to lack of exercise and poor diet.  Prior workup with endocrinology in 2015 showed no evidence through imaging or labs,and was repeated again per patient request, with no new findings  Advised to resume [place]

## 2017-10-05 NOTE — Assessment & Plan Note (Signed)
CELEBREX, FLEXERIL AND ICE PRESCRIBED

## 2017-10-06 ENCOUNTER — Telehealth: Payer: Self-pay | Admitting: Internal Medicine

## 2017-10-06 NOTE — Telephone Encounter (Signed)
Patient notified of lab results- she will call back to schedule her annual exam in October.  Lab not in basket.

## 2017-10-06 NOTE — Telephone Encounter (Signed)
Called pt regarding her MRI that is scheduled. She asked about her valium rx. [facility] day that Dr[facility] Derrel Nip called sent it in and said that the rx was sent incomplete, that she did not specify how to take the medication.  Pt would like to go pick this up today if possible and wants to make sure that it will be ready when she is there.  Can you please check to see if it has been corrected or [facility]? Pt cb [phone]

## 2017-10-06 NOTE — Telephone Encounter (Signed)
Spoke with the pharmacy to clarify directions of use and then called the pt to let her know and also clarified directions of use with her. Pt gave a verbal understanding.

## 2017-10-12 ENCOUNTER — Ambulatory Visit: Payer: Medicare HMO

## 2017-10-23 ENCOUNTER — Other Ambulatory Visit: Payer: Self-pay | Admitting: Cardiovascular Disease

## 2017-10-25 ENCOUNTER — Ambulatory Visit: Payer: Medicare HMO

## 2017-10-26 ENCOUNTER — Telehealth: Payer: Self-pay | Admitting: Cardiovascular Disease

## 2017-10-26 NOTE — Telephone Encounter (Signed)
°*  STAT* If patient is at the pharmacy, call can be transferred to refill team.   1. Which medications need to be refilled? (please list name of each medication and dose if known)  Sotalol 80 mg twice a day   2. Which pharmacy/location (including street and city if local pharmacy) is [facility] sent to[facility] [place]   3. Do they need a 30 day or 90 day supply?  90 day

## 2017-10-26 NOTE — Telephone Encounter (Signed)
Pt overdue for 1 yr f/u pt needs to schedule future appointment for refills, Thank you.

## 2017-10-27 MED ORDER — SOTALOL HCL 80 MG PO TABS: ORAL_TABLET | ORAL | 1 refills | Status: DC

## 2017-10-27 NOTE — Telephone Encounter (Signed)
Please call pt regarding doseage lowering on Sotalol

## 2017-10-27 NOTE — Telephone Encounter (Signed)
I called and spoke with the patient and clarified she is taking sotalol 80 mg - 1 tablet by mouth twice daily. This was the last dose documented in her chart when she last saw Dr. Rockey Situ. Per the patient, she thinks the directions for sotalol may have mistakenly gotten changed when she was seen by her PCP. I have advised the I will [facility] to clarify with [facility] that she is taking sotalol 80 mg BID.  I called and spoke with Charletta [facility]in D at [facility] and advised her of the correct dosing for the patient and that she could have #60 tablets w/ 1 refill. Per Vicente Males, she will take care of this for the patient.  The patient has been notified that I have spoken with the pharmacy.  She is very appreciative for the help given to her.

## 2017-10-27 NOTE — Telephone Encounter (Signed)
Pt has an appt for 7/1 with Dr. Rockey Situ

## 2017-10-28 ENCOUNTER — Ambulatory Visit
Admission: RE | Admit: 2017-10-28 | Discharge: 2017-10-28 | Disposition: A | Discharge status: Home / Self Care | Payer: Medicare HMO | Source: Ambulatory Visit | Attending: Internal Medicine | Admitting: Internal Medicine

## 2017-10-28 ENCOUNTER — Ambulatory Visit: Payer: Medicare HMO

## 2017-10-28 DIAGNOSIS — E236 Other disorders of pituitary gland: Secondary | ICD-10-CM | POA: Diagnosis not present

## 2017-10-28 MED ORDER — GADOBENATE DIMEGLUMINE 529 MG/ML IV SOLN
10.0000 mL | Freq: Once | INTRAVENOUS | Status: AC | PRN
  Administered 2017-10-28: 10 mL via INTRAVENOUS

## 2017-11-02 ENCOUNTER — Other Ambulatory Visit: Payer: Self-pay | Admitting: Internal Medicine

## 2017-11-17 DIAGNOSIS — N859 Noninflammatory disorder of uterus, unspecified: Secondary | ICD-10-CM | POA: Diagnosis not present

## 2017-11-17 DIAGNOSIS — Z1231 Encounter for screening mammogram for malignant neoplasm of breast: Secondary | ICD-10-CM | POA: Diagnosis not present

## 2017-11-17 DIAGNOSIS — Z01419 Encounter for gynecological examination (general) (routine) without abnormal findings: Secondary | ICD-10-CM | POA: Diagnosis not present

## 2017-11-17 DIAGNOSIS — Z800 Family history of malignant neoplasm of digestive organs: Secondary | ICD-10-CM | POA: Diagnosis not present

## 2017-11-27 IMAGING — US US THYROID
1 series · 14 of 25 positions shown · non-contrast
Comparison: 05/28/2014, and previous dating back to  09/14/2010

CLINICAL DATA: Thyroid cyst

EXAM:
THYROID ULTRASOUND
TECHNIQUE: Ultrasound examination of the thyroid gland and adjacent soft
tissues was performed.

[Series 1: us thyroid · 0.07mm/px · 14 of 67 slices shown]
[im 1/67]
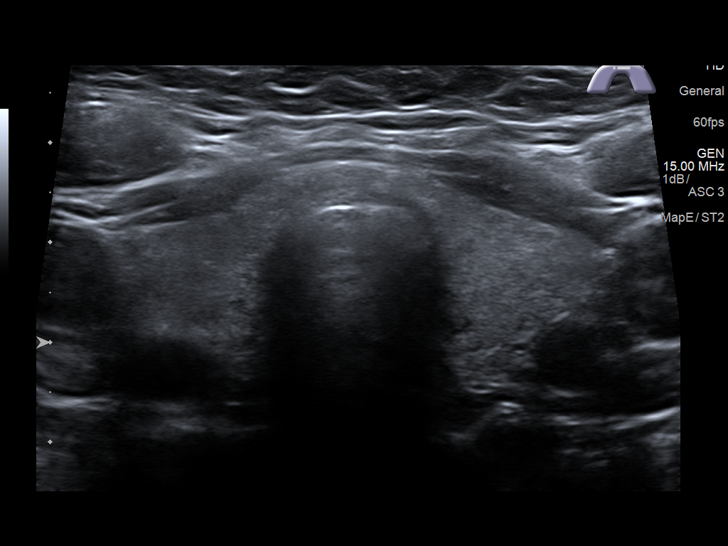
[im 6/67]
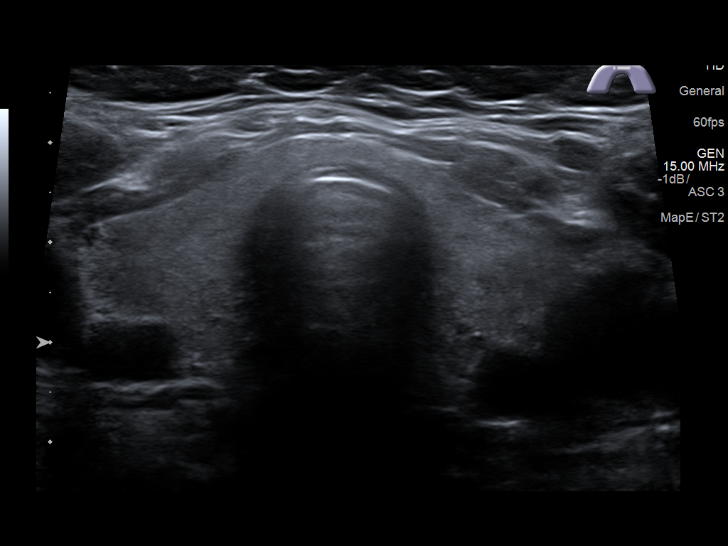
[im 12/67]
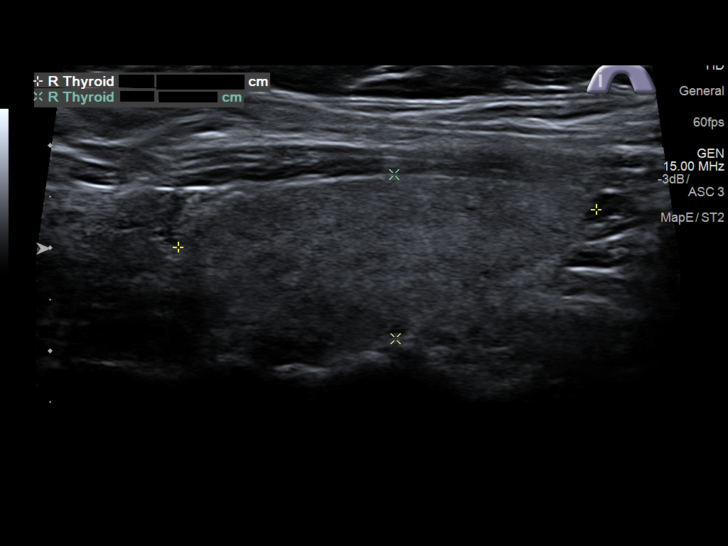
[im 17/67]
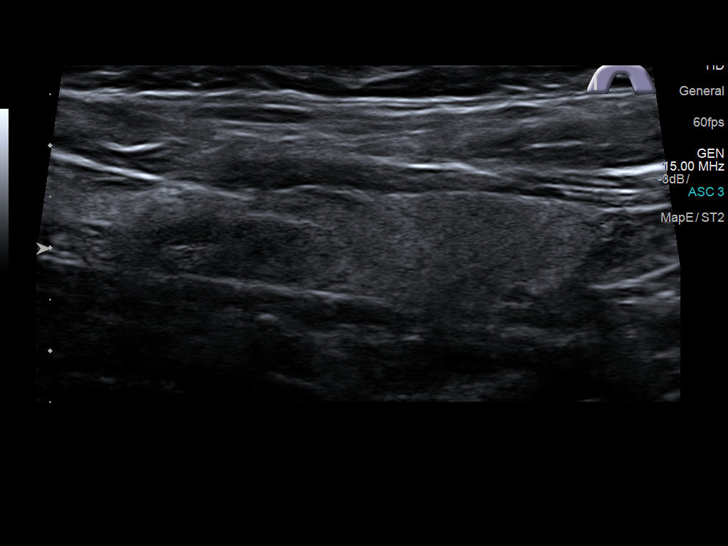
[im 23/67]
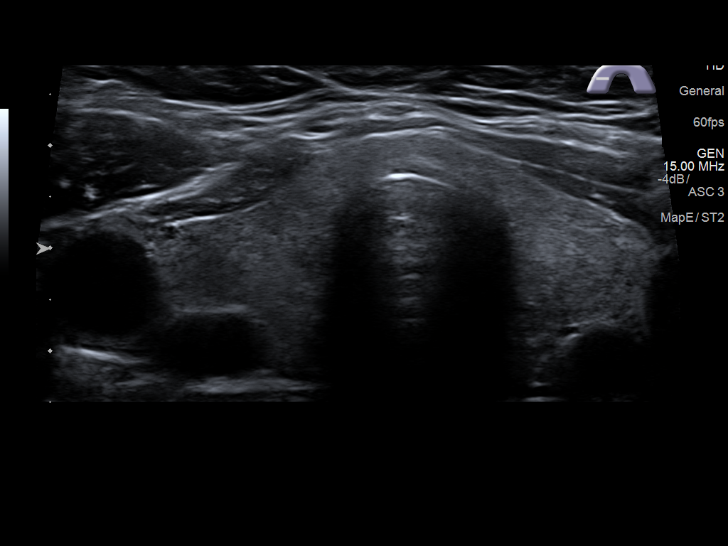
[im 25/67]
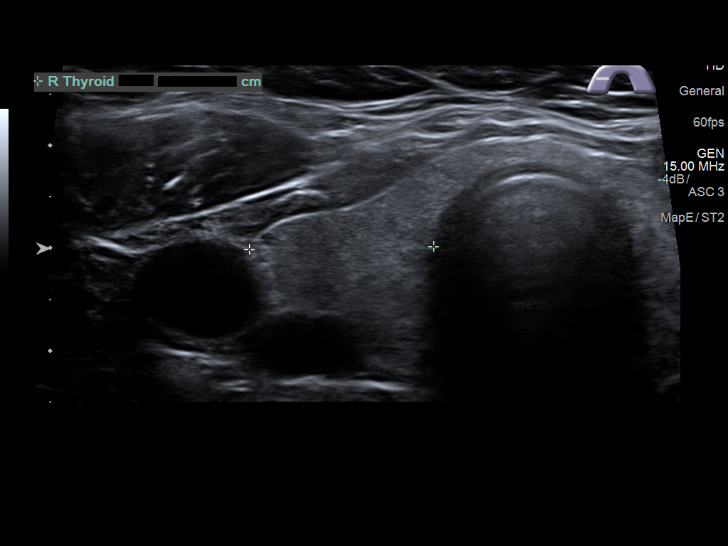
[im 31/67]
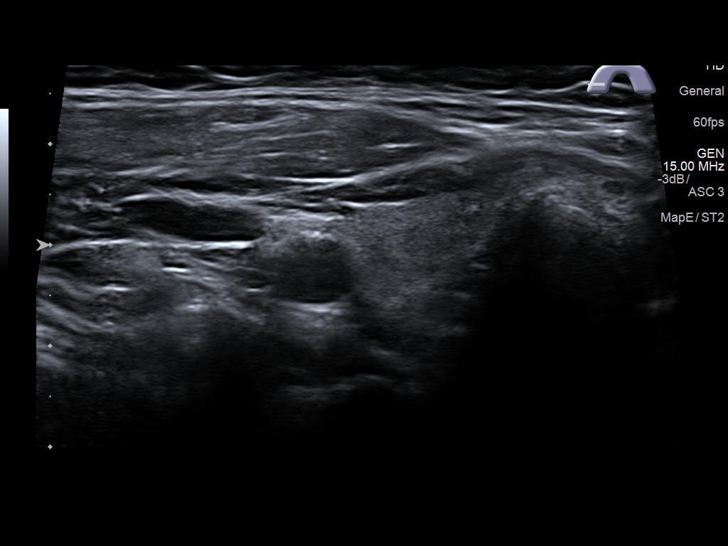
[im 36/67]
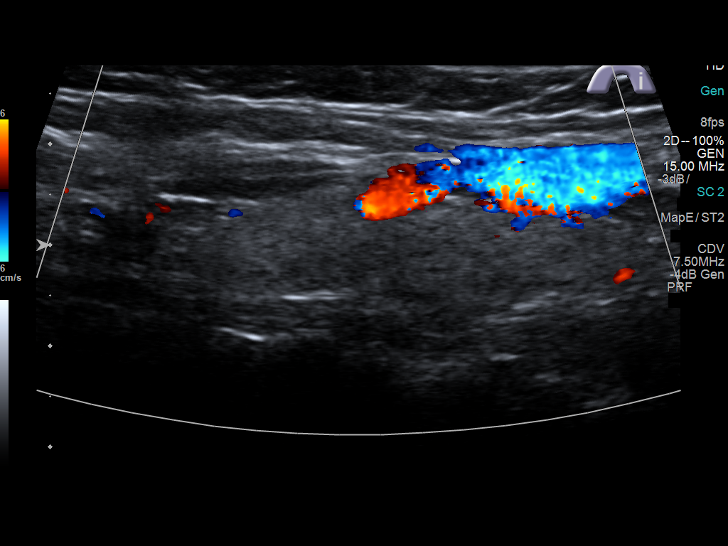
[im 42/67]
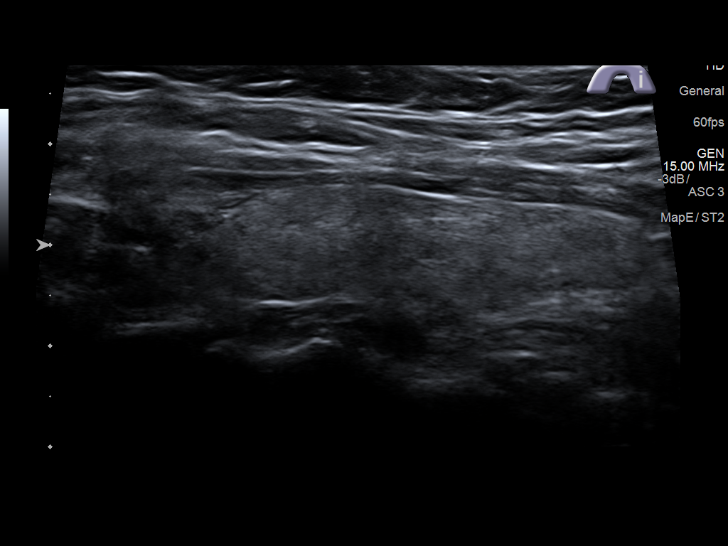
[im 45/67]
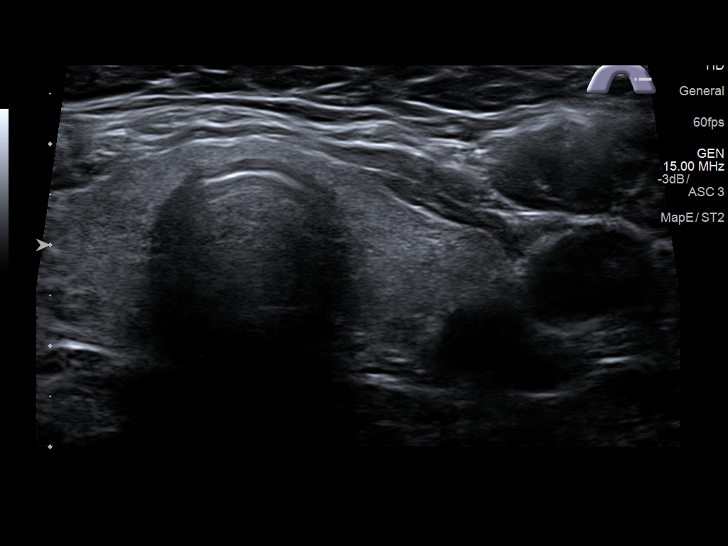
[im 50/67]
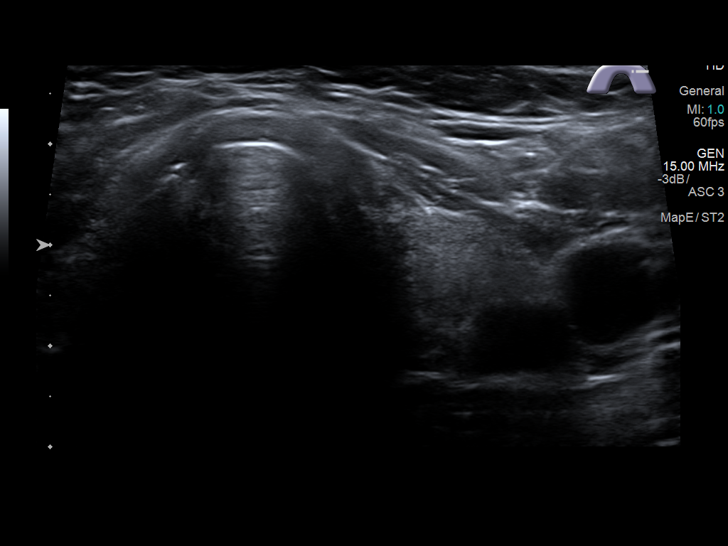
[im 56/67]
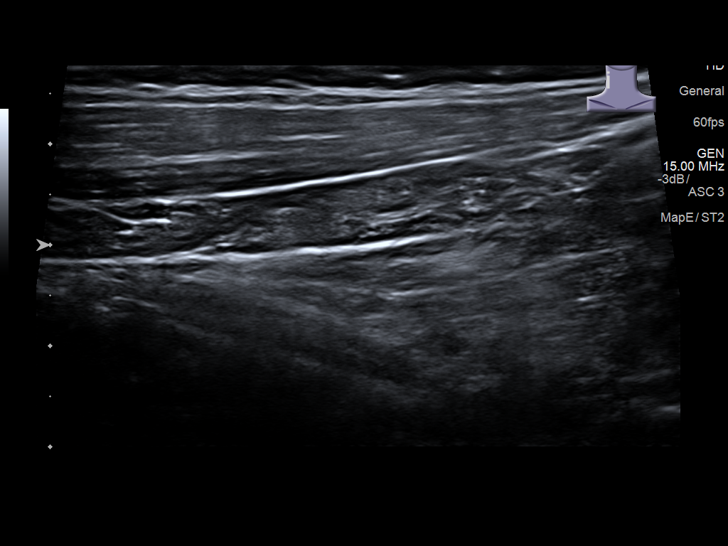
[im 61/67]
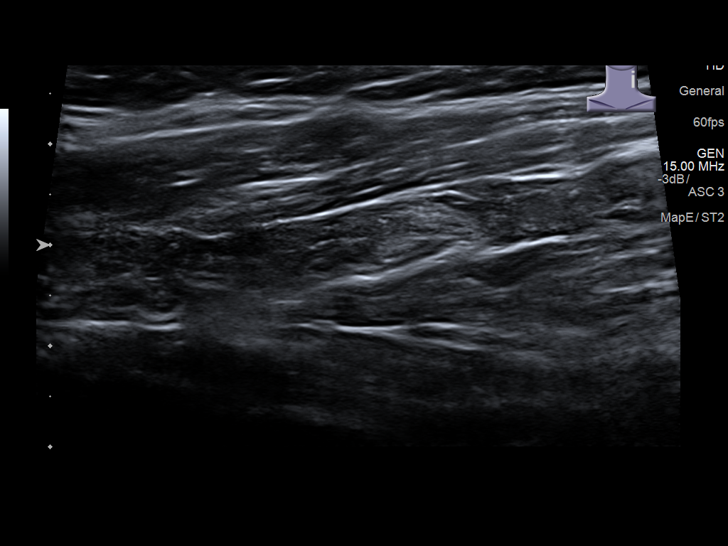
[im 67/67]
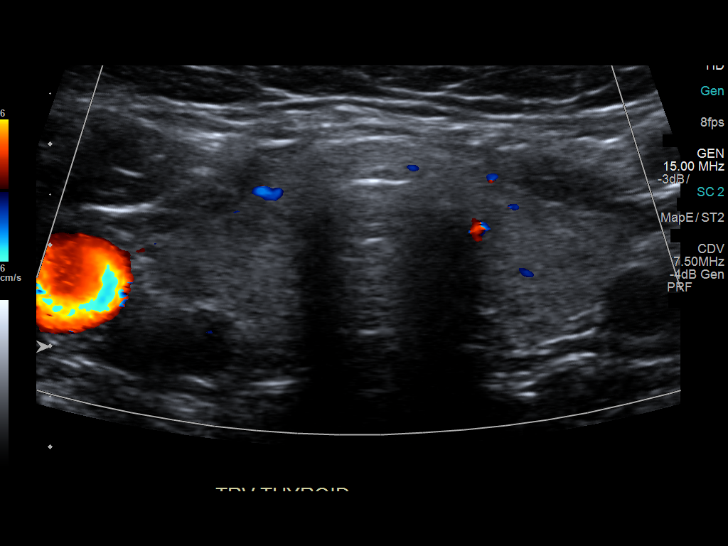

[14 of 25 positions shown; findings below may reference images not displayed]

FINDINGS: Parenchymal Echotexture: Mildly heterogenous

Isthmus: 0.3 cm thickness, stable

Right lobe: 4.1 x 1.6 x 1.8 cm, previously 4.4 x 1.8 x

Left lobe: 4.3 x 1.4 x 1.6 cm, previously 4.3 x 1.2 x

_________________________________________________________

Estimated total number of nodules >/= 1 cm: 0

Number of spongiform nodules >/=  2 cm not described below (TR1): 0

Number of mixed cystic and solid nodules >/= 1.5 cm not described
below (TR2): 0

_________________________________________________________

No discrete nodules are seen within the thyroid gland.
IMPRESSION: Normal thyroid. No indication for biopsy or dedicated imaging
follow-up.

The above is in keeping with the ACR TI-RADS recommendations - [HOSPITAL] 8612;[DATE].

## 2017-11-28 DIAGNOSIS — Z1231 Encounter for screening mammogram for malignant neoplasm of breast: Secondary | ICD-10-CM | POA: Diagnosis not present

## 2017-12-03 NOTE — Progress Notes (Signed)
Cardiology Office Note  Date:  12/05/2017   ID:  TEFFANY Wiley, DOB Feb 21, 1956, MRN [id number]  PCP:  Crecencio Mc, MD   Chief Complaint  Patient presents with  . Other    12 month follow up. Patient c/o gaining about 1-2 pounds a weeks and SOB. Meds reviewed verbally with patient.     HPI:  [name] is a very pleasant 62 year old woman with a  history of  PFO,  with  closure device in April 2008 performed in [place], hyperlipidemia,  PAT versus paroxysmal atrial fibrillation on sotalol, hypertension,  Bell's palsy gout GERD CT coronary calcium score 0 in 2015 Significant baseline stress, continues to care for her ex-husband who has underlying cognitive issues who presents today for follow-up of her arrhythmia  Since her last clinic visit she has gained weight lack of exercise and poor diet.   History of thyroid nodule :  Saw Endocrine in October   Lab work reviewed A1c of 6.4   Takes sotalol 40 mg QID 9 am  3 pm 9 pm 3 Am Good control of palpitations and headache  Legs are very tender to palpation Takes Lasix periodically denies significant swelling  Previously seen by rheumatology, having left shoulder discomfort, right ankle pain Was told was not rheumatoid arthritis given not symmetrical  Total chol 153, LDL 82  EKG personally reviewed by myself on todays visit Shows normal sinus rhythm with rate 63 bpm no significant ST or T-wave changes  Other past medical history reviewed Previously mentioned having a problem with her uterus, was scheduled for hysterectomy but this has been canceled in the setting of her inflammatory issue   lost her mother in February 2015.  History of gout in her wrists. Improvement with colchicine. 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  PSH:    Past Surgical History:  Procedure Laterality Date  . BREAST BIOPSY Right 2009   lymphoid and fibroadipose tissue  . COLONOSCOPY  08-2014  . DILATION AND CURETTAGE OF UTERUS    . ESOPHAGOGASTRODUODENOSCOPY    . HYSTEROSCOPY W/D&C  01/20/2015   Procedure: DILATATION AND CURETTAGE /HYSTEROSCOPY;  Surgeon: Brayton Mars, MD;  Location: [facility] ORS;  Service: Gynecology;;  . [facility] EAR SURGERY     x 4  . LAPAROSCOPY  01/20/2015   Procedure: LAPAROSCOPY DIAGNOSTIC;  Surgeon: Brayton Mars, MD;  Location: [facility] ORS;  Service: Gynecology;;  . NASAL SEPTUM SURGERY    . PATENT FORAMEN OVALE CLOSURE  April 2008  . TMJ x 2    . uterine ablation  March 2008    Current Outpatient Medications  Medication Sig Dispense Refill  . acetaminophen (TYLENOL) 500 MG tablet Take 500 mg by mouth daily as needed for pain.    Marland Kitchen  albuterol (PROVENTIL HFA;VENTOLIN HFA) 108 (90 Base) MCG/ACT inhaler Inhale 2 puffs into the lungs every 6 (six) hours as needed for wheezing or shortness of breath. 1 Inhaler 0  . ALPRAZolam (XANAX) 0.5 MG tablet Take 1 tablet (0.5 mg total) by mouth at bedtime as needed for anxiety  or sleep. 31 tablet 5  . amoxicillin (AMOXIL) 500 MG capsule TAKE 4 CAPSULES BY MOUTH 1 HOUR PRIOR TO PROCEDURE 12 capsule 0  . aspirin EC 81 MG tablet Take 81 mg by mouth daily.    . calcium-vitamin D 250-100 MG-UNIT tablet Take 1 tablet by mouth 2 (two) times daily.    . celecoxib (CELEBREX) 200 MG capsule TAKE 1 CAPSULE BY MOUTH TWICE A DAY 60 capsule 2  . chlorhexidine (PERIDEX) 0.12 % solution GARGLE AND SPIT 15 MLS IN THE MOUTH OR THROAT 2 (TWO) TIMES DAILY. (Patient taking differently: GARGLE AND SPIT 15 MLS IN THE MOUTH OR THROAT PRN.) 473 mL 0  . colchicine 0.6 MG tablet Take 1 tablet (0.6 mg total) by mouth 2 (two) times daily as needed. 30 tablet 1  . cyclobenzaprine (FLEXERIL) 10 MG tablet Take 1 tablet (10 mg total) by mouth 3 (three) times daily as needed for muscle spasms. 30 tablet 0  . Echinacea 125 MG CAPS Take by mouth.    . esomeprazole (NEXIUM) 40 MG capsule Take 1 capsule (40 mg total) by mouth daily. 90 capsule 3  . furosemide (LASIX) 20 MG tablet Take 1 tablet (20 mg total) by mouth daily as needed for edema. 30 tablet 11  . hyoscyamine (LEVSIN SL) 0.125 MG SL tablet Place 1 tablet (0.125 mg total) under the tongue every 4 (four) hours as needed for cramping. 60 tablet 3  . ibuprofen (ADVIL,MOTRIN) 200 MG tablet Take by mouth as needed.     . magnesium gluconate (MAGONATE) 500 MG tablet Take 500 mg by mouth 2 (two) times daily.    . potassium chloride (K-DUR) 10 MEQ tablet Take 1 tablet (10 mEq total) by mouth daily as needed (With Lasix). 30 tablet 11  . simvastatin (ZOCOR) 40 MG tablet TAKE 1 TABLET (40 MG TOTAL) BY MOUTH DAILY. 30 tablet 11  . sotalol (BETAPACE) 80 MG tablet Take 1 tablet (80 mg) by mouth twice daily 60 tablet 11  . traMADol (ULTRAM) 50 MG tablet Take 1 tablet (50 mg total) by mouth every 8 (eight) hours as needed. 90 tablet 3  . triamterene-hydrochlorothiazide (DYAZIDE) 37.5-25 MG capsule TAKE 1 EACH (1 CAPSULE TOTAL) BY MOUTH DAILY. 90 capsule 3  .  valACYclovir (VALTREX) 1000 MG tablet TAKE 1 TABLET BY MOUTH DAILY FOR SUPPRESSION , OR 5 TIMES DAILY FOR FLARE 35 tablet 2  . vitamin C (ASCORBIC ACID) 500 MG tablet Take 500 mg by mouth daily.     No current facility-administered medications for this visit.      Allergies:   2,4-d dimethylamine (amisol); Clarithromycin; Nitrofurantoin monohyd macro; Other; Azithromycin; Ciprofloxacin; Codeine; Erythromycin base; Hydrocodone-acetaminophen; Morphine; Nitrofurantoin; Pamelor [nortriptyline hcl]; Stadol [butorphanol]; Talwin [pentazocine]; Topamax [topiramate]; Wellbutrin [bupropion]; Zanaflex [tizanidine hcl]; Lamictal [lamotrigine]; and Macrobid [nitrofurantoin macrocrystal]   Social History:  The patient  reports that she has never smoked. She has never used smokeless tobacco. She reports that she does not drink alcohol or use drugs.   Family History:   family history includes Colon cancer (age of onset: 40) in her sister; Heart failure in her father; Hyperlipidemia in her mother; Hypertension in her mother; Hypothyroidism in her mother; Kidney  disease in her mother; Ovarian cancer in her unknown relative; Stomach cancer in her maternal grandmother; Uterine cancer (age of onset: 53) in her mother.    Review of Systems: Review of Systems  Constitutional: Negative.        Weight gain  Respiratory: Negative.   Cardiovascular: Positive for leg swelling.  Gastrointestinal: Negative.   Musculoskeletal: Negative.   Neurological: Negative.   Psychiatric/Behavioral: Negative.   All other systems reviewed and are negative.    PHYSICAL EXAM: VS:  BP 138/72 (BP Location: Left Arm, Patient Position: Sitting, Cuff Size: Normal)   Pulse 63   Ht 5\' 5"  (1.651 m)   Wt 225 lb (102.1 kg)   BMI 37.44 kg/m  , BMI Body mass index is 37.44 kg/m. Constitutional:  oriented to person, place, and time. No distress. obese HENT:  Head: Normocephalic and atraumatic.  Eyes:  no discharge. No scleral icterus.   Neck: Normal range of motion. Neck supple. No JVD present.  Cardiovascular: Normal rate, regular rhythm, normal heart sounds and intact distal pulses. Exam reveals no gallop and no friction rub. No edema No murmur heard. Pulmonary/Chest: Effort normal and breath sounds normal. No stridor. No respiratory distress.  no wheezes.  no rales.  no tenderness.  Abdominal: Soft.  no distension.  no tenderness.  Musculoskeletal: Normal range of motion.  no  tenderness or deformity.  Neurological:  normal muscle tone. Coordination normal. No atrophy Skin: Skin is warm and dry. No rash noted. not diaphoretic.  Psychiatric:  normal mood and affect. behavior is normal. Thought content normal.    Recent Labs: 01/31/2017: TSH 2.74 10/04/2017: ALT 11; BUN 14; Creatinine, Ser 0.76; Potassium 3.8; Sodium 139    Lipid Panel Lab Results  Component Value Date   CHOL 161 01/31/2017   HDL 35.90 (L) 01/31/2017   LDLCALC 82 07/31/2015   TRIG 312.0 (H) 01/31/2017      Wt Readings from Last 3 Encounters:  12/05/17 225 lb (102.1 kg)  10/04/17 223 lb (101.2 kg)  04/04/17 219 lb (99.3 kg)       ASSESSMENT AND PLAN:  SVT/ PSVT/ PAT - Plan: EKG 12-Lead Denies having any significant symptoms of atrial tachycardia Feels better on sotalol 40 mg 4 times a day No changes made  Mixed hyperlipidemia - Plan: EKG 12-Lead Cholesterol is at goal on the current lipid regimen.  We'll hold the statin to see if leg pain improves  Obesity (BMI 30-39.9) - Plan: EKG 12-Lead Recommended regular walking program, pool therapy, strict low carbohydrate diet She is going to restart Nutrisystem  Bilateral lower extremity edema - Plan: EKG 12-Lead Taking Lasix periodically for symptoms overall stable  Leg pain Etiology unclear, recommended she hold her statin for several weeks to see if symptoms improve  Prolonged QT interval QTc is stable   Disposition:   F/U  12 months   Total encounter time more than 25  minutes  Greater than 50% was spent in counseling and coordination of care with the patient    Orders Placed This Encounter  Procedures  . EKG 12-Lead     Signed, Esmond Plants, M.D., Ph.D. 12/05/2017  [place], [place]

## 2017-12-05 ENCOUNTER — Other Ambulatory Visit: Payer: Self-pay | Admitting: Internal Medicine

## 2017-12-05 ENCOUNTER — Encounter: Payer: Self-pay | Admitting: Cardiovascular Disease

## 2017-12-05 ENCOUNTER — Ambulatory Visit: Payer: Medicare HMO | Admitting: Cardiovascular Disease

## 2017-12-05 VITALS — BP 138/72 | HR 63 | Ht 65.0 in | Wt 225.0 lb

## 2017-12-05 DIAGNOSIS — I471 Supraventricular tachycardia: Secondary | ICD-10-CM

## 2017-12-05 DIAGNOSIS — Q211 Atrial septal defect: Secondary | ICD-10-CM | POA: Diagnosis not present

## 2017-12-05 DIAGNOSIS — E782 Mixed hyperlipidemia: Secondary | ICD-10-CM

## 2017-12-05 DIAGNOSIS — M1990 Unspecified osteoarthritis, unspecified site: Secondary | ICD-10-CM

## 2017-12-05 DIAGNOSIS — R600 Localized edema: Secondary | ICD-10-CM

## 2017-12-05 DIAGNOSIS — Q2111 Secundum atrial septal defect: Secondary | ICD-10-CM

## 2017-12-05 MED ORDER — SIMVASTATIN 40 MG PO TABS: ORAL_TABLET | ORAL | 11 refills | Status: DC

## 2017-12-05 MED ORDER — SOTALOL HCL 80 MG PO TABS: ORAL_TABLET | ORAL | 11 refills | Status: DC

## 2017-12-05 MED ORDER — POTASSIUM CHLORIDE ER 10 MEQ PO TBCR: 10.0000 meq | EXTENDED_RELEASE_TABLET | Freq: Every day | ORAL | 11 refills | Status: DC | PRN

## 2017-12-05 MED ORDER — FUROSEMIDE 20 MG PO TABS: 20.0000 mg | ORAL_TABLET | Freq: Every day | ORAL | 11 refills | Status: DC | PRN

## 2017-12-05 NOTE — Patient Instructions (Addendum)
Medication Instructions:   No medication changes made  Ok to hold the zocor for one month  Labwork:  No new labs needed  Testing/Procedures:  No further testing at this time   Follow-Up: It was a pleasure seeing you in the office today. Please call us if you have new issues that need to be addressed before your next appt.  [phone]  Your physician wants you to follow-up in: 12 months.  You will receive a reminder letter in the mail two months in advance. If you don't receive a letter, please call our office to schedule the follow-up appointment.  If you need a refill on your cardiac medications before your next appointment, please call your pharmacy.  For educational health videos Log in to : [email or web address] Or : [email or web address], password : [email or web address]

## 2017-12-06 ENCOUNTER — Telehealth: Payer: Self-pay | Admitting: Cardiovascular Disease

## 2017-12-06 DIAGNOSIS — E782 Mixed hyperlipidemia: Secondary | ICD-10-CM

## 2017-12-06 DIAGNOSIS — E785 Hyperlipidemia, unspecified: Secondary | ICD-10-CM

## 2017-12-06 NOTE — Telephone Encounter (Signed)
Pt asks when her last lipid profile was done. This has to do with stopping her Zocor Please call and advise. If unavailable on the home #, please try cell at [phone]

## 2017-12-06 NOTE — Telephone Encounter (Signed)
Patient calling to see when her last cholesterol numbers were done. Reviewed that it appears to be back in August 2018 unless she had some done at another location. She denies any other labs. She would like to know if we could order a repeat lipid panel before she does the trial of stopping her medication. Advised that I would check with provider and then be in touch with her. She was appreciative for the call back with no questions at this time.

## 2017-12-06 NOTE — Telephone Encounter (Signed)
Left message for patient to call back  

## 2017-12-06 NOTE — Telephone Encounter (Signed)
We can send in a order for fasting lipids and liver she would like Then she can stop the simvastatin to see if leg pain goes away

## 2017-12-07 NOTE — Telephone Encounter (Signed)
Left detailed voicemail message per release form that orders have been placed for repeat labs with instructions to go to [facility] to have those done, we will call with results, and to call back if any further questions.

## 2017-12-09 ENCOUNTER — Other Ambulatory Visit: Payer: Self-pay | Admitting: Internal Medicine

## 2017-12-09 NOTE — Telephone Encounter (Signed)
Patient called to let the office know that   [id number] [place], [place] [phone] (Phone) [phone] (Fax)      will be requesting a refill for the Amoxicillin because of a dental emergency.  Patient needs this medication as soon as possible.  CB [phone].

## 2017-12-09 NOTE — Telephone Encounter (Signed)
OK for dental emergency

## 2017-12-20 DIAGNOSIS — H2513 Age-related nuclear cataract, bilateral: Secondary | ICD-10-CM | POA: Diagnosis not present

## 2017-12-20 DIAGNOSIS — H5213 Myopia, bilateral: Secondary | ICD-10-CM | POA: Diagnosis not present

## 2017-12-20 DIAGNOSIS — H524 Presbyopia: Secondary | ICD-10-CM | POA: Diagnosis not present

## 2017-12-20 DIAGNOSIS — H52223 Regular astigmatism, bilateral: Secondary | ICD-10-CM | POA: Diagnosis not present

## 2017-12-20 DIAGNOSIS — D3131 Benign neoplasm of right choroid: Secondary | ICD-10-CM | POA: Diagnosis not present

## 2017-12-27 ENCOUNTER — Other Ambulatory Visit
Admission: RE | Admit: 2017-12-27 | Discharge: 2017-12-27 | Disposition: A | Discharge status: Home / Self Care | Payer: Medicare HMO | Source: Ambulatory Visit | Attending: Cardiovascular Disease | Admitting: Cardiovascular Disease

## 2017-12-27 DIAGNOSIS — E782 Mixed hyperlipidemia: Secondary | ICD-10-CM | POA: Insufficient documentation

## 2017-12-27 DIAGNOSIS — M67471 Ganglion, right ankle and foot: Secondary | ICD-10-CM | POA: Diagnosis not present

## 2017-12-27 LAB — HEPATIC FUNCTION PANEL
ALT: 17 U/L (ref 0–44)
AST: 26 U/L (ref 15–41)
Albumin: 3.9 g/dL (ref 3.5–5.0)
Alkaline Phosphatase: 73 U/L (ref 38–126)
BILIRUBIN TOTAL: 0.8 mg/dL (ref 0.3–1.2)
Total Protein: 7.7 g/dL (ref 6.5–8.1)

## 2017-12-27 LAB — LIPID PANEL
Cholesterol: 170 mg/dL (ref 0–200)
HDL: 42 mg/dL (ref >40)
LDL Cholesterol: 85 mg/dL (ref 0–99)
Total CHOL/HDL Ratio: 4 RATIO
Triglycerides: 215 mg/dL — ABNORMAL HIGH (ref <150)
VLDL: 43 mg/dL — ABNORMAL HIGH (ref 0–40)

## 2018-03-31 ENCOUNTER — Telehealth: Payer: Self-pay | Admitting: *Deleted

## 2018-03-31 NOTE — Telephone Encounter (Signed)
Copied from [place] [phone]. Topic: Appointment Scheduling - Scheduling Inquiry for Clinic >> Mar 31, 2018 11:43 AM Gardiner Ramus wrote: Reason for CRM: pt called and stated that husband has an appointment 04/03/18. When she comes in with him can she get a flu shot. Please advise [phone]

## 2018-03-31 NOTE — Telephone Encounter (Signed)
Patient notified and voiced understanding she can get flu shot while in office on Monday. Just FYI

## 2018-04-03 ENCOUNTER — Ambulatory Visit (INDEPENDENT_AMBULATORY_CARE_PROVIDER_SITE_OTHER): Payer: Medicare HMO

## 2018-04-03 DIAGNOSIS — Z23 Encounter for immunization: Secondary | ICD-10-CM

## 2018-05-02 ENCOUNTER — Telehealth: Payer: Self-pay | Admitting: Internal Medicine

## 2018-05-02 ENCOUNTER — Other Ambulatory Visit: Payer: Self-pay | Admitting: Internal Medicine

## 2018-05-02 MED ORDER — ALPRAZOLAM 0.5 MG PO TABS: ORAL_TABLET | ORAL | 5 refills | Status: DC

## 2018-05-02 NOTE — Telephone Encounter (Signed)
Called to let pt know that rx has been refilled and confirmed by pharmacy at 5:19 today. Patient says she will go pick them up in the morning.

## 2018-05-02 NOTE — Telephone Encounter (Signed)
I don't see this on patient's current med list. She is requesting a refill?

## 2018-05-02 NOTE — Telephone Encounter (Signed)
Refilled: 10/04/2017 Last OV: 10/04/2017 Next OV: not scheduled

## 2018-05-02 NOTE — Telephone Encounter (Signed)
I believe this Is a duplicate request & I sent to you earlier?

## 2018-05-02 NOTE — Telephone Encounter (Signed)
When you don't see the med  Look under "history.,"  Because it gets automatically dumped when the refills and time run out.   Med sent

## 2018-05-02 NOTE — Telephone Encounter (Signed)
Copied from [place] [phone]. Topic: Quick Communication - Rx Refill/Question >> May 02, 2018  1:30 PM Conception Chancy, NT wrote: Medication: ALPRAZolam Duanne Moron) 0.5 MG tablet   Has the patient contacted their pharmacy? Yes. Patient requested this at the pharmacy on 04/26/18 and they told her they have reached out 4 times and can not get a response. She states she has 1 left for tonight.  Preferred Pharmacy (with phone number or street name): [id number] [place], [address] [place] [address] Phone: [phone] Fax: [phone]    Agent: Please be advised that RX refills may take up to 3 business days. We ask that you follow-up with your pharmacy.

## 2018-05-02 NOTE — Telephone Encounter (Signed)
Duplicate request,  Please call patient and let her know I HAVE NOT RECEIVED PRIOR REQUESTS FROM PHARMACY  MED REFILLED

## 2018-05-05 NOTE — Telephone Encounter (Signed)
[facility] and stated the med is currently on backorder and wants to know if a 1mg  script can be sent over for pt to cut in half

## 2018-05-10 ENCOUNTER — Telehealth: Payer: Self-pay | Admitting: Internal Medicine

## 2018-05-10 MED ORDER — ALPRAZOLAM 1 MG PO TABS: 0.5000 mg | ORAL_TABLET | Freq: Every evening | ORAL | 5 refills | Status: DC | PRN

## 2018-05-10 NOTE — Telephone Encounter (Signed)
Copied from [place] [phone]. Topic: Quick Communication - Rx Refill/Question >> May 10, 2018  8:35 AM Margot Ables wrote: Medication: ALPRAZolam Duanne Moron) 0.5 MG tablet - medication on back order with no release date (noted that 0.25mg  is also on back order) - requesting change to the alprazolam 1mg  tablet 1/2 tablet at bedtime  Has the patient contacted their pharmacy? yes Preferred Pharmacy (with phone number or street name):[id number] [place], [place] [phone] (Phone) [phone] (Fax)

## 2018-05-10 NOTE — Telephone Encounter (Signed)
Medication changed to 1 mg and sent in with directions to take 1/2 tablet at bedtime as needed

## 2018-05-11 ENCOUNTER — Ambulatory Visit (INDEPENDENT_AMBULATORY_CARE_PROVIDER_SITE_OTHER): Payer: Medicare HMO

## 2018-05-11 ENCOUNTER — Other Ambulatory Visit: Payer: Self-pay | Admitting: Internal Medicine

## 2018-05-11 ENCOUNTER — Ambulatory Visit: Payer: Medicare HMO | Admitting: Family Medicine

## 2018-05-11 ENCOUNTER — Encounter: Payer: Self-pay | Admitting: Family Medicine

## 2018-05-11 ENCOUNTER — Ambulatory Visit: Payer: Self-pay | Admitting: Family Medicine

## 2018-05-11 VITALS — BP 110/76 | HR 67 | Temp 98.1°F | Ht 64.0 in | Wt 226.6 lb

## 2018-05-11 DIAGNOSIS — G8929 Other chronic pain: Secondary | ICD-10-CM

## 2018-05-11 DIAGNOSIS — M25551 Pain in right hip: Secondary | ICD-10-CM

## 2018-05-11 DIAGNOSIS — M545 Low back pain, unspecified: Secondary | ICD-10-CM

## 2018-05-11 DIAGNOSIS — M25552 Pain in left hip: Secondary | ICD-10-CM

## 2018-05-11 DIAGNOSIS — M4186 Other forms of scoliosis, lumbar region: Secondary | ICD-10-CM | POA: Diagnosis not present

## 2018-05-11 MED ORDER — METHYLPREDNISOLONE 4 MG PO TBPK: ORAL_TABLET | ORAL | 0 refills | Status: DC

## 2018-05-11 NOTE — Telephone Encounter (Signed)
LMTCB. Pec may speak with pt.

## 2018-05-11 NOTE — Progress Notes (Signed)
Subjective:    Patient ID: Tracey Wiley, female    DOB: 1955/06/21, 62 y.o.   MRN: [id number]  HPI  Presents to clinic with right hip pain for past 2 weeks.  Patient has a history of low back pain and lumbar canal stenosis which was seen on MRI in 2016.  Patient does have Celebrex she uses when she has backslash joint pains.  Patient has not used any Celebrex currently, but has taken a few doses of Tylenol and Motrin.  Patient states 2 days ago she began to have pain in left hip as well.  Patient states she noticed having pain in her right and left groin with motion of hips.   Patient denies any known new injury to low back or hips.  Patient denies any lower extremity numbness, denies saddle anesthesia, denies loss of bowel or bladder control.  Patient Active Problem List   Diagnosis Date Noted  . Strain of muscle, fascia and tendon of lower back, initial encounter 10/05/2017  . Dysphagia 08/15/2016  . Bilateral lower extremity edema 05/14/2016  . Neuritis or radiculitis due to rupture of lumbar intervertebral disc 02/21/2015  . Lumbar canal stenosis 02/21/2015  . Family history of Lynch syndrome 12/20/2014  . Inflammatory arthritis 12/17/2014  . Facial paralysis/Bells palsy 10/29/2014  . Arthritis, gouty 01/26/2014  . Hypokalemia 01/24/2014  . Routine general medical examination at a health care facility 07/03/2013  . Abnormal chest CT 06/14/2013  . 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 Solitary pulmonary nodule 12/05/2012  . Cervicalgia 08/27/2012  . Gastro-esophageal reflux disease with esophagitis 08/27/2012  . Generalized anxiety disorder 08/27/2012  . Post-concussion vertigo   . 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   . Obesity (BMI 30-39.9) 05/28/2012  . Vitamin D deficiency 05/11/2012  . Meralgia paresthetica of right side 09/26/2011  . Breast hypertrophy in female 09/26/2011  . Hyperlipidemia 11/05/2009  . SVT/ PSVT/ PAT 11/05/2009  . PATENT FORAMEN  OVALE 11/05/2009   Social History   Tobacco Use  . Smoking status: Never Smoker  . Smokeless tobacco: Never Used  Substance Use Topics  . Alcohol use: No   Review of Systems   Constitutional: Negative for chills, fatigue and fever.  HENT: Negative for congestion, ear pain, sinus pain and sore throat.   Eyes: Negative.   Respiratory: Negative for cough, shortness of breath and wheezing.   Cardiovascular: Negative for chest pain, palpitations and leg swelling.  Gastrointestinal: Negative for abdominal pain, diarrhea, nausea and vomiting.  Genitourinary: Negative for dysuria, frequency and urgency.  Musculoskeletal: +right hip pain Skin: Negative for color change, pallor and rash.  Neurological: Negative for syncope, light-headedness and headaches.  Psychiatric/Behavioral: The patient is not nervous/anxious.    Objective:   Physical Exam  Constitutional: She is oriented to person, place, and time. She appears well-nourished. No distress.  HENT:  Head: Normocephalic and atraumatic.  Eyes: Conjunctivae and EOM are normal.  Neck: Neck supple. No tracheal deviation present.  Cardiovascular: Normal rate and regular rhythm.  Pulmonary/Chest: Effort normal and breath sounds normal. No respiratory distress. She has no wheezes. She has no rales.  Musculoskeletal:  Mild low back, right hip and left hip tenderness.  Pain in both hips with abduction, no pain in hips with abduction or straight leg raises.  Gait appears normal.  Quadricep strength equal and strong.  Neurological: She is alert and oriented to person, place, and time.  Skin: Skin is warm and dry. No pallor.  Psychiatric: She has a normal mood and affect. Her behavior is normal.  Nursing note and vitals reviewed.     Vitals:   05/11/18 1409  BP: 110/76  Pulse: 67  Temp: 98.1 F (36.7 C)  SpO2: 97%   Wt Readings from Last 3 Encounters:  05/11/18 226 lb 9.6 oz (102.8 kg)  12/05/17 225 lb (102.1 kg)  10/04/17 223 lb  (101.2 kg)   Assessment & Plan:    A total of 25 minutes were spent face-to-face with the patient during this encounter and over half of that time was spent on counseling and coordination of care. The patient was counseled on medications, how low back pain can radiate into hips, need for new radiologic images. .   Low back pain, right hip pain, left hip pain- we will get x-rays of lumbar spine, right hip and left hip to further investigate.  Patient advised to try taking Celebrex twice a day for the next 5 to 7 days and also we will do steroid taper down to help improve pain.  Also discussed gentle range of motion and stretching exercises that patient can do to help improve pain.  We can consider physical therapy referral, patient would like to wait until we have x-ray results back before we do any sort of referral at this time.  Patient aware she will be contacted with x-ray results and we will discuss future plan of care at that time.

## 2018-05-15 NOTE — Telephone Encounter (Signed)
Pt called in and stated that she has her 1st dental appt tomorrow, she was suppose start taking this med 1 hour before her appt.  She stated she went to pick it up and it was not ready.  She is stating she needs this today.    [id number] [place], [place] [phone] (Phone

## 2018-05-18 DIAGNOSIS — N858 Other specified noninflammatory disorders of uterus: Secondary | ICD-10-CM | POA: Diagnosis not present

## 2018-05-18 DIAGNOSIS — Z800 Family history of malignant neoplasm of digestive organs: Secondary | ICD-10-CM | POA: Diagnosis not present

## 2018-06-02 ENCOUNTER — Encounter: Payer: Self-pay | Admitting: Internal Medicine

## 2018-06-02 ENCOUNTER — Ambulatory Visit: Payer: Medicare HMO | Admitting: Internal Medicine

## 2018-06-02 VITALS — BP 112/78 | HR 58 | Temp 98.9°F | Resp 15 | Ht 64.0 in | Wt 226.4 lb

## 2018-06-02 DIAGNOSIS — Z800 Family history of malignant neoplasm of digestive organs: Secondary | ICD-10-CM | POA: Diagnosis not present

## 2018-06-02 DIAGNOSIS — Z87898 Personal history of other specified conditions: Secondary | ICD-10-CM

## 2018-06-02 DIAGNOSIS — M5116 Intervertebral disc disorders with radiculopathy, lumbar region: Secondary | ICD-10-CM

## 2018-06-02 DIAGNOSIS — R42 Dizziness and giddiness: Secondary | ICD-10-CM | POA: Diagnosis not present

## 2018-06-02 DIAGNOSIS — E785 Hyperlipidemia, unspecified: Secondary | ICD-10-CM

## 2018-06-02 DIAGNOSIS — F411 Generalized anxiety disorder: Secondary | ICD-10-CM

## 2018-06-02 DIAGNOSIS — R7303 Prediabetes: Secondary | ICD-10-CM | POA: Diagnosis not present

## 2018-06-02 DIAGNOSIS — E876 Hypokalemia: Secondary | ICD-10-CM

## 2018-06-02 DIAGNOSIS — R69 Illness, unspecified: Secondary | ICD-10-CM | POA: Diagnosis not present

## 2018-06-02 DIAGNOSIS — S39012A Strain of muscle, fascia and tendon of lower back, initial encounter: Secondary | ICD-10-CM | POA: Diagnosis not present

## 2018-06-02 DIAGNOSIS — F0781 Postconcussional syndrome: Secondary | ICD-10-CM

## 2018-06-02 MED ORDER — PREDNISONE 10 MG PO TABS: ORAL_TABLET | ORAL | 0 refills | Status: DC

## 2018-06-02 NOTE — Patient Instructions (Signed)
Start with 5 mg flexeril,  Take it 2 hours prior to your alprazolam .  If you take celebrex and aspirin on same day,  Increase Nexium to twice daily  For the duration    You are due for 3 yr follow up colonoscopy,  I'll refer you to dr Carlean Purl   Return for fasting labs
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
## 2018-06-03 DIAGNOSIS — R7303 Prediabetes: Secondary | ICD-10-CM | POA: Insufficient documentation

## 2018-06-03 DIAGNOSIS — Z87898 Personal history of other specified conditions: Secondary | ICD-10-CM | POA: Insufficient documentation

## 2018-06-03 NOTE — Assessment & Plan Note (Addendum)
Aggravated by obesity and inactivity due to recurrent vertigo. Repeat a1c is pending   Lab Results  Component Value Date   HGBA1C 6.4 10/04/2017

## 2018-06-03 NOTE — Assessment & Plan Note (Signed)
Recurrent , secondary initially to remote episode of barotrauma, aggravated by concussion. She has several episodes per week that are disabling

## 2018-06-03 NOTE — Assessment & Plan Note (Signed)
She is due for 3 yr follow up colonoscopy. Referral to Dr Carlean Purl

## 2018-06-03 NOTE — Assessment & Plan Note (Signed)
Managed with alprazolam for insomnia.  Dose is 0.5 mg but mdication had to be refilledusing 1 mg tab;ets due tp the national shortage.  The risks and benefits of benzodiazepine use were discussed with patient today including excessive sedation leading to respiratory depression,  impaired thinking/driving, and addiction.  Patient was advised to avoid concurrent use with alcohol, to use medication only as needed and not to share with others  .

## 2018-06-03 NOTE — Assessment & Plan Note (Signed)
Continue celebrex,  Prn flexeril , massage.  Encourage core strength improvement

## 2018-06-03 NOTE — Assessment & Plan Note (Signed)
Trial of celebrex,  Flexeril for recurrent episodic pain

## 2018-06-04 ENCOUNTER — Telehealth: Payer: Self-pay | Admitting: Internal Medicine

## 2018-06-04 DIAGNOSIS — Z0279 Encounter for issue of other medical certificate: Secondary | ICD-10-CM

## 2018-06-04 NOTE — Telephone Encounter (Signed)
The 6 page disability form she requested I do for her life insurance premiums to be waived is complete.  It took me 60 minutes!  Because  of this Please charge $100 for the fee.  You will need to fill in the addresses for Dr Loistine Chance and Dr Anda Latina, and she will need to fill in the dates of her ear surgeries since I do not have that information

## 2018-06-05 ENCOUNTER — Other Ambulatory Visit: Payer: Self-pay | Admitting: Family Medicine

## 2018-06-05 ENCOUNTER — Other Ambulatory Visit: Payer: Self-pay | Admitting: Internal Medicine

## 2018-06-05 NOTE — Telephone Encounter (Signed)
Patient is requesting Amoxicillin for dental procedure. She has 3 more appointments with the dentist. She took the medication that was sent on 05/16/18, but the appointment was changed.

## 2018-06-05 NOTE — Telephone Encounter (Signed)
LMTCB. Please transfer pt to our office.  

## 2018-06-05 NOTE — Telephone Encounter (Signed)
Copied from [place] [phone]. Topic: Quick Communication - Rx Refill/Question >> Jun 05, 2018 12:25 PM Judyann Munson wrote: Medication: amoxicillin (AMOXIL) 500 MG capsule  Patient  stated her appt has been schedule with her dentist and she has 3 more appt. And she stated she is pre medication for nothing because her appt her being chnaged.   Has the patient contacted their pharmacy? Yes  Preferred Pharmacy (with phone number or street name): [id number] [place], [place] [phone] (Phone) [phone] (Fax)    Agent: Please be advised that RX refills may take up to 3 business days. We ask that you follow-up with your pharmacy.

## 2018-06-06 MED ORDER — AMOXICILLIN 500 MG PO CAPS: ORAL_CAPSULE | ORAL | 1 refills | Status: DC

## 2018-06-06 NOTE — Telephone Encounter (Signed)
I spoke with patient & she will pick up papers at front office.

## 2018-06-29 DIAGNOSIS — M71371 Other bursal cyst, right ankle and foot: Secondary | ICD-10-CM | POA: Diagnosis not present

## 2018-06-29 DIAGNOSIS — M67472 Ganglion, left ankle and foot: Secondary | ICD-10-CM | POA: Diagnosis not present

## 2018-06-29 DIAGNOSIS — D2371 Other benign neoplasm of skin of right lower limb, including hip: Secondary | ICD-10-CM | POA: Diagnosis not present

## 2018-07-18 ENCOUNTER — Other Ambulatory Visit (INDEPENDENT_AMBULATORY_CARE_PROVIDER_SITE_OTHER): Payer: Medicare HMO

## 2018-07-18 DIAGNOSIS — E876 Hypokalemia: Secondary | ICD-10-CM | POA: Diagnosis not present

## 2018-07-18 DIAGNOSIS — R7303 Prediabetes: Secondary | ICD-10-CM

## 2018-07-18 DIAGNOSIS — E785 Hyperlipidemia, unspecified: Secondary | ICD-10-CM

## 2018-07-18 LAB — COMPREHENSIVE METABOLIC PANEL
ALBUMIN: 4 g/dL (ref 3.5–5.2)
ALK PHOS: 66 U/L (ref 39–117)
ALT: 15 U/L (ref 0–35)
AST: 20 U/L (ref 0–37)
BUN: 15 mg/dL (ref 6–23)
CO2: 32 mEq/L (ref 19–32)
CREATININE: 0.78 mg/dL (ref 0.40–1.20)
Calcium: 9.2 mg/dL (ref 8.4–10.5)
Chloride: 101 mEq/L (ref 96–112)
GFR: 75.2 mL/min (ref >60.00)
GLUCOSE: 99 mg/dL (ref 70–99)
Potassium: 3.9 mEq/L (ref 3.5–5.1)
Sodium: 140 mEq/L (ref 135–145)
TOTAL PROTEIN: 7.4 g/dL (ref 6.0–8.3)
Total Bilirubin: 0.4 mg/dL (ref 0.2–1.2)

## 2018-07-18 LAB — LIPID PANEL
CHOL/HDL RATIO: 7
CHOLESTEROL: 253 mg/dL — AB (ref 0–200)
HDL: 37.8 mg/dL — ABNORMAL LOW (ref >39.00)
NonHDL: 214.93
Triglycerides: 260 mg/dL — ABNORMAL HIGH (ref 0.0–149.0)
VLDL: 52 mg/dL — ABNORMAL HIGH (ref 0.0–40.0)

## 2018-07-18 LAB — HEMOGLOBIN A1C: HEMOGLOBIN A1C: 6.3 % (ref 4.6–6.5)

## 2018-07-18 LAB — LDL CHOLESTEROL, DIRECT: LDL DIRECT: 171 mg/dL

## 2018-07-21 ENCOUNTER — Other Ambulatory Visit: Payer: Self-pay | Admitting: Internal Medicine

## 2018-07-21 DIAGNOSIS — E785 Hyperlipidemia, unspecified: Secondary | ICD-10-CM

## 2018-07-21 MED ORDER — ROSUVASTATIN CALCIUM 5 MG PO TABS: 5.0000 mg | ORAL_TABLET | Freq: Every day | ORAL | 0 refills | Status: DC

## 2018-07-24 ENCOUNTER — Encounter: Payer: Self-pay | Admitting: Internal Medicine

## 2018-08-21 ENCOUNTER — Telehealth: Payer: Self-pay | Admitting: Internal Medicine

## 2018-08-21 MED ORDER — ESOMEPRAZOLE MAGNESIUM 40 MG PO CPDR: 40.0000 mg | DELAYED_RELEASE_CAPSULE | Freq: Every day | ORAL | 1 refills | Status: DC

## 2018-08-21 NOTE — Telephone Encounter (Signed)
Pt needs rf for generic of Nexium [facility] to [facility] [place].

## 2018-08-21 NOTE — Telephone Encounter (Signed)
Patient's generic Nexium refilled as requested.

## 2018-09-04 ENCOUNTER — Other Ambulatory Visit: Payer: Self-pay | Admitting: Cardiovascular Disease

## 2018-10-03 ENCOUNTER — Other Ambulatory Visit: Payer: Self-pay | Admitting: Family Medicine

## 2018-10-31 ENCOUNTER — Other Ambulatory Visit: Payer: Self-pay | Admitting: Internal Medicine

## 2018-11-01 NOTE — Telephone Encounter (Signed)
Refilled: 05/10/2018 Last OV: 06/02/2018 Next OV: not scheduled

## 2018-11-06 ENCOUNTER — Telehealth: Payer: Self-pay | Admitting: *Deleted

## 2018-11-06 NOTE — Telephone Encounter (Signed)
PA has been submitted on covermymeds.  

## 2018-11-06 NOTE — Telephone Encounter (Signed)
Copied from [place] [id number]. Topic: General - Other >> Nov 06, 2018  9:31 AM Yvette Rack wrote: Reason for CRM: [place] with [facility] stated that a PA is needed to be sent to patient insurance company for the Rx ALPRAZolam Duanne Moron) 1 MG tablet.

## 2018-11-23 DIAGNOSIS — Z01419 Encounter for gynecological examination (general) (routine) without abnormal findings: Secondary | ICD-10-CM | POA: Diagnosis not present

## 2018-11-23 DIAGNOSIS — Z800 Family history of malignant neoplasm of digestive organs: Secondary | ICD-10-CM | POA: Diagnosis not present

## 2018-11-23 DIAGNOSIS — Z1231 Encounter for screening mammogram for malignant neoplasm of breast: Secondary | ICD-10-CM | POA: Diagnosis not present

## 2018-11-27 ENCOUNTER — Other Ambulatory Visit: Payer: Self-pay | Admitting: Cardiovascular Disease

## 2018-12-01 ENCOUNTER — Other Ambulatory Visit: Payer: Self-pay | Admitting: *Deleted

## 2018-12-01 MED ORDER — SOTALOL HCL 80 MG PO TABS: ORAL_TABLET | ORAL | 0 refills | Status: DC

## 2018-12-06 ENCOUNTER — Other Ambulatory Visit: Payer: Self-pay | Admitting: Cardiovascular Disease

## 2018-12-14 NOTE — Telephone Encounter (Signed)
PA has been approved through 11/06/2019.

## 2018-12-19 DIAGNOSIS — Z1231 Encounter for screening mammogram for malignant neoplasm of breast: Secondary | ICD-10-CM | POA: Diagnosis not present

## 2018-12-28 DIAGNOSIS — R922 Inconclusive mammogram: Secondary | ICD-10-CM | POA: Diagnosis not present

## 2018-12-28 DIAGNOSIS — R928 Other abnormal and inconclusive findings on diagnostic imaging of breast: Secondary | ICD-10-CM | POA: Diagnosis not present

## 2019-01-01 ENCOUNTER — Other Ambulatory Visit: Payer: Self-pay | Admitting: Internal Medicine

## 2019-01-01 NOTE — Telephone Encounter (Signed)
Pt called to inquire about refill. Made aware to check keep checking in with Pharmacy -Pt is out Appt getting scheduled now

## 2019-01-02 NOTE — Telephone Encounter (Signed)
Refilled: 11/01/2018 Last OV: 06/03/2019 Next OV: 01/12/2019

## 2019-01-09 ENCOUNTER — Other Ambulatory Visit: Payer: Self-pay

## 2019-01-09 MED ORDER — SOTALOL HCL 80 MG PO TABS: 80.0000 mg | ORAL_TABLET | Freq: Two times a day (BID) | ORAL | 0 refills | Status: DC

## 2019-01-12 ENCOUNTER — Other Ambulatory Visit: Payer: Self-pay

## 2019-01-12 ENCOUNTER — Encounter: Payer: Self-pay | Admitting: Internal Medicine

## 2019-01-12 ENCOUNTER — Ambulatory Visit (INDEPENDENT_AMBULATORY_CARE_PROVIDER_SITE_OTHER): Payer: Medicare HMO | Admitting: Internal Medicine

## 2019-01-12 VITALS — Ht 64.0 in | Wt 226.0 lb

## 2019-01-12 DIAGNOSIS — R600 Localized edema: Secondary | ICD-10-CM | POA: Diagnosis not present

## 2019-01-12 DIAGNOSIS — K2100 Gastro-esophageal reflux disease with esophagitis, without bleeding: Secondary | ICD-10-CM

## 2019-01-12 DIAGNOSIS — R69 Illness, unspecified: Secondary | ICD-10-CM | POA: Diagnosis not present

## 2019-01-12 DIAGNOSIS — Z800 Family history of malignant neoplasm of digestive organs: Secondary | ICD-10-CM | POA: Diagnosis not present

## 2019-01-12 DIAGNOSIS — R7303 Prediabetes: Secondary | ICD-10-CM | POA: Diagnosis not present

## 2019-01-12 DIAGNOSIS — E782 Mixed hyperlipidemia: Secondary | ICD-10-CM | POA: Diagnosis not present

## 2019-01-12 DIAGNOSIS — Z87898 Personal history of other specified conditions: Secondary | ICD-10-CM | POA: Diagnosis not present

## 2019-01-12 DIAGNOSIS — F411 Generalized anxiety disorder: Secondary | ICD-10-CM

## 2019-01-12 DIAGNOSIS — R5383 Other fatigue: Secondary | ICD-10-CM | POA: Diagnosis not present

## 2019-01-12 MED ORDER — SPIRONOLACTONE 25 MG PO TABS: 25.0000 mg | ORAL_TABLET | Freq: Every day | ORAL | 1 refills | Status: DC | PRN

## 2019-01-12 MED ORDER — CHLORHEXIDINE GLUCONATE 0.12 % MT SOLN: OROMUCOSAL | 0 refills | Status: DC

## 2019-01-12 MED ORDER — TRAZODONE HCL 50 MG PO TABS: ORAL_TABLET | ORAL | 3 refills | Status: DC
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
## 2019-01-14 ENCOUNTER — Encounter: Payer: Self-pay | Admitting: Internal Medicine

## 2019-01-14 NOTE — Assessment & Plan Note (Signed)
Changing diuretic to spironolactone given recurrent hypokalemia

## 2019-01-14 NOTE — Assessment & Plan Note (Signed)
Referral to [facility] in progress

## 2019-01-14 NOTE — Assessment & Plan Note (Signed)
Low HDL,  LDL < 100.  MANAGED WITH simvastatin. she is due for follow up labs.  Ordered today  Lab Results  Component Value Date   CHOL 253 (H) 07/18/2018   HDL 37.80 (L) 07/18/2018   LDLCALC 85 12/27/2017   LDLDIRECT 171.0 07/18/2018   TRIG 260.0 (H) 07/18/2018   CHOLHDL 7 07/18/2018

## 2019-01-14 NOTE — Assessment & Plan Note (Signed)
Reassured that her current mood lability is more likely due to covid 19 related economic consequence and less likely due to the [place] of vaginal  estrogen

## 2019-01-14 NOTE — Assessment & Plan Note (Signed)
Continue nexium 40 mg daily  Given history of stricture noted on recent EGD

## 2019-01-14 NOTE — Assessment & Plan Note (Signed)
Aggravated by obesity and inactivity due to recurrent vertigo. Repeat a1c is needed  Lab Results  Component Value Date   HGBA1C 6.3 07/18/2018

## 2019-01-23 ENCOUNTER — Other Ambulatory Visit (INDEPENDENT_AMBULATORY_CARE_PROVIDER_SITE_OTHER): Payer: Medicare HMO

## 2019-01-23 ENCOUNTER — Other Ambulatory Visit: Payer: Self-pay

## 2019-01-23 DIAGNOSIS — R7303 Prediabetes: Secondary | ICD-10-CM

## 2019-01-23 DIAGNOSIS — R5383 Other fatigue: Secondary | ICD-10-CM | POA: Diagnosis not present

## 2019-01-23 DIAGNOSIS — E785 Hyperlipidemia, unspecified: Secondary | ICD-10-CM

## 2019-01-23 DIAGNOSIS — E782 Mixed hyperlipidemia: Secondary | ICD-10-CM | POA: Diagnosis not present

## 2019-01-23 LAB — COMPREHENSIVE METABOLIC PANEL
ALT: 14 U/L (ref 0–35)
AST: 18 U/L (ref 0–37)
Albumin: 3.8 g/dL (ref 3.5–5.2)
Alkaline Phosphatase: 67 U/L (ref 39–117)
BUN: 16 mg/dL (ref 6–23)
CO2: 31 mEq/L (ref 19–32)
Calcium: 8.9 mg/dL (ref 8.4–10.5)
Chloride: 100 mEq/L (ref 96–112)
Creatinine, Ser: 0.79 mg/dL (ref 0.40–1.20)
GFR: 73.98 mL/min (ref >60.00)
Glucose, Bld: 99 mg/dL (ref 70–99)
Potassium: 4 mEq/L (ref 3.5–5.1)
Sodium: 138 mEq/L (ref 135–145)
Total Bilirubin: 0.4 mg/dL (ref 0.2–1.2)
Total Protein: 6.7 g/dL (ref 6.0–8.3)

## 2019-01-23 LAB — LIPID PANEL
Cholesterol: 223 mg/dL — ABNORMAL HIGH (ref 0–200)
HDL: 37.6 mg/dL — ABNORMAL LOW (ref >39.00)
NonHDL: 184.98
Total CHOL/HDL Ratio: 6
Triglycerides: 313 mg/dL — ABNORMAL HIGH (ref 0.0–149.0)
VLDL: 62.6 mg/dL — ABNORMAL HIGH (ref 0.0–40.0)

## 2019-01-23 LAB — CBC WITH DIFFERENTIAL/PLATELET
Basophils Absolute: 0 10*3/uL (ref 0.0–0.1)
Basophils Relative: 0.5 % (ref 0.0–3.0)
Eosinophils Absolute: 0.2 10*3/uL (ref 0.0–0.7)
Eosinophils Relative: 2.5 % (ref 0.0–5.0)
HCT: 37.7 % (ref 36.0–46.0)
Hemoglobin: 12.3 g/dL (ref 12.0–15.0)
Lymphocytes Relative: 36.5 % (ref 12.0–46.0)
Lymphs Abs: 2.8 10*3/uL (ref 0.7–4.0)
MCHC: 32.5 g/dL (ref 30.0–36.0)
MCV: 82.5 fl (ref 78.0–100.0)
Monocytes Absolute: 0.5 10*3/uL (ref 0.1–1.0)
Monocytes Relative: 6.4 % (ref 3.0–12.0)
Neutro Abs: 4.2 10*3/uL (ref 1.4–7.7)
Neutrophils Relative %: 54.1 % (ref 43.0–77.0)
Platelets: 336 10*3/uL (ref 150.0–400.0)
RBC: 4.57 Mil/uL (ref 3.87–5.11)
RDW: 15.9 % — ABNORMAL HIGH (ref 11.5–15.5)
WBC: 7.8 10*3/uL (ref 4.0–10.5)

## 2019-01-23 LAB — TSH: TSH: 2.48 u[IU]/mL (ref 0.35–4.50)

## 2019-01-23 LAB — LDL CHOLESTEROL, DIRECT: Direct LDL: 136 mg/dL

## 2019-01-23 LAB — HEMOGLOBIN A1C: Hgb A1c MFr Bld: 6.2 % (ref 4.6–6.5)
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
## 2019-01-23 NOTE — Addendum Note (Signed)
Addended by: Elpidio Galea T on: 01/23/2019 11:37 AM   Modules accepted: Orders

## 2019-01-25 ENCOUNTER — Ambulatory Visit: Payer: Medicare HMO | Admitting: Physician Assistant

## 2019-01-25 DIAGNOSIS — R87610 Atypical squamous cells of undetermined significance on cytologic smear of cervix (ASC-US): Secondary | ICD-10-CM | POA: Diagnosis not present

## 2019-01-25 DIAGNOSIS — R87615 Unsatisfactory cytologic smear of cervix: Secondary | ICD-10-CM | POA: Diagnosis not present

## 2019-01-26 ENCOUNTER — Other Ambulatory Visit: Payer: Self-pay

## 2019-01-26 ENCOUNTER — Encounter: Payer: Self-pay | Admitting: Physician Assistant

## 2019-01-26 ENCOUNTER — Ambulatory Visit (INDEPENDENT_AMBULATORY_CARE_PROVIDER_SITE_OTHER): Payer: Medicare HMO | Admitting: Physician Assistant

## 2019-01-26 VITALS — BP 138/76 | HR 71 | Ht 65.0 in | Wt 229.0 lb

## 2019-01-26 DIAGNOSIS — E785 Hyperlipidemia, unspecified: Secondary | ICD-10-CM

## 2019-01-26 DIAGNOSIS — M79605 Pain in left leg: Secondary | ICD-10-CM | POA: Diagnosis not present

## 2019-01-26 DIAGNOSIS — I471 Supraventricular tachycardia: Secondary | ICD-10-CM

## 2019-01-26 DIAGNOSIS — M79604 Pain in right leg: Secondary | ICD-10-CM | POA: Diagnosis not present

## 2019-01-26 DIAGNOSIS — R600 Localized edema: Secondary | ICD-10-CM | POA: Diagnosis not present

## 2019-01-26 MED ORDER — ROSUVASTATIN CALCIUM 5 MG PO TABS: 5.0000 mg | ORAL_TABLET | Freq: Every day | ORAL | 3 refills | Status: DC

## 2019-01-26 NOTE — Patient Instructions (Signed)
Medication Instructions:  Resume Crestor If you need a refill on your cardiac medications before your next appointment, please call your pharmacy.   Lab work: None ordered  If you have labs (blood work) drawn today and your tests are completely normal, you will receive your results only by: Marland Kitchen MyChart Message (if you have MyChart) OR . A paper copy in the mail If you have any lab test that is abnormal or we need to change your treatment, we will call you to review the results.  Testing/Procedures: 1- Calcium scoring  Your physician has order a CT Calcium Scoring. This procedure uses special x-ray equipment to produce pictures of the coronary arteries to determine if they are blocked or narrowed by the buildup of plaque - an indicator for atherosclerosis or coronary artery disease (CAD).   Call [phone] for appt   $150 due at visit   [facility] location [place]: At [facility], you and your health needs are our priority.  As part of our continuing mission to provide you with exceptional heart care, we have created designated Provider Care Teams.  These Care Teams include your primary Cardiologist (physician) and Advanced Practice Providers (APPs -  Physician Assistants and Nurse Practitioners) who all work together to provide you with the care you need, when you need it. You will need a follow up appointment in 3 months.  You may see Ida Rogue, MD or Murray Hodgkins, NP or  Christell Faith, PA-C

## 2019-01-30 ENCOUNTER — Telehealth: Payer: Self-pay

## 2019-01-30 NOTE — Telephone Encounter (Signed)
Spoke with pt and she stated that she is not taking either one at this time. She stated that the simvastatin gave her extreme muscle aches/cramps so she stopped taking it. Never started the Crestor. Saw Christell Faith, PA at [facility] and he told the pt to please consider trying the Crestor. Pt stated that as soon as she completes some "GYN medication" she is going to give it a try.

## 2019-01-30 NOTE — Telephone Encounter (Signed)
-----   Message from Crecencio Mc, MD sent at 01/29/2019  8:24 AM EDT ----- Cholesterol improved.  Is she taking Crestor or simvastatin? Both in chart.  Other labs fine

## 2019-01-31 ENCOUNTER — Other Ambulatory Visit: Payer: Self-pay | Admitting: Internal Medicine

## 2019-02-01 NOTE — Telephone Encounter (Signed)
Pt is calling about her medication.  She was told by [name] that she could continue it until her other issues get cleared up  She says this as discussed at the 01/12/19 visit.  Pt is asking for refill for now.  She will start trazadone when things clear up.  She is asking to see if she can half a month of 0.5 and a half of month of .25 mg  Pt has only until Saturday  cb is [phone]

## 2019-02-01 NOTE — Telephone Encounter (Signed)
Refilled: 01/02/2019 Last OV: 01/12/2019 Next OV: not scheduled

## 2019-02-05 ENCOUNTER — Other Ambulatory Visit: Payer: Self-pay | Admitting: Cardiovascular Disease

## 2019-02-09 ENCOUNTER — Other Ambulatory Visit: Payer: Medicare HMO

## 2019-02-13 ENCOUNTER — Other Ambulatory Visit: Payer: Medicare HMO

## 2019-02-24 ENCOUNTER — Other Ambulatory Visit: Payer: Self-pay | Admitting: Cardiovascular Disease

## 2019-02-26 ENCOUNTER — Other Ambulatory Visit: Payer: Self-pay

## 2019-02-26 ENCOUNTER — Ambulatory Visit (INDEPENDENT_AMBULATORY_CARE_PROVIDER_SITE_OTHER): Payer: Medicare HMO

## 2019-02-26 DIAGNOSIS — Z0000 Encounter for general adult medical examination without abnormal findings: Secondary | ICD-10-CM
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
## 2019-02-26 NOTE — Patient Instructions (Addendum)
  Ms. Kiebler , Thank you for taking time to come for your Medicare Wellness Visit. I appreciate your ongoing commitment to your health goals. Please review the following plan we discussed and let me know if I can assist you in the future.   These are the goals we discussed: Goals      Patient Stated   . Increase physical activity (pt-stated)     I want to increase my steps to 10,000 daily       This is a list of the screening recommended for you and due dates:  Health Maintenance  Topic Date Due  .  Hepatitis C: One time screening is recommended by Center for Disease Control  (CDC) for  adults born from [birth year] through 1963.   07/05/1955  . HIV Screening  01/04/1973  . Flu Shot  09/05/2019*  . Colon Cancer Screening  02/26/2020*  . Tetanus Vaccine  02/26/2020*  . Mammogram  12/27/2020  . Pap Smear  01/24/2022  *Topic was postponed. The date shown is not the original due date.

## 2019-02-27 ENCOUNTER — Encounter: Payer: Self-pay | Admitting: Physician Assistant

## 2019-02-27 ENCOUNTER — Other Ambulatory Visit: Payer: Self-pay

## 2019-02-27 ENCOUNTER — Ambulatory Visit: Payer: Medicare HMO | Admitting: Physician Assistant

## 2019-02-27 VITALS — BP 122/78 | HR 60 | Temp 97.9°F | Ht 65.0 in | Wt 229.0 lb

## 2019-02-27 DIAGNOSIS — Z1211 Encounter for screening for malignant neoplasm of colon: Secondary | ICD-10-CM

## 2019-02-27 DIAGNOSIS — K219 Gastro-esophageal reflux disease without esophagitis: Secondary | ICD-10-CM

## 2019-02-27 DIAGNOSIS — R1310 Dysphagia, unspecified: Secondary | ICD-10-CM | POA: Diagnosis not present

## 2019-02-27 MED ORDER — ESOMEPRAZOLE MAGNESIUM 40 MG PO CPDR: 40.0000 mg | DELAYED_RELEASE_CAPSULE | Freq: Every day | ORAL | 11 refills | Status: DC

## 2019-02-27 MED ORDER — ONDANSETRON HCL 4 MG PO TABS: 4.0000 mg | ORAL_TABLET | Freq: Four times a day (QID) | ORAL | 0 refills | Status: DC | PRN
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
## 2019-02-27 NOTE — Patient Instructions (Addendum)
If you are age 61 or older, your body mass index should be between 23-30. Your Body mass index is 38.11 kg/m. If this is out of the aforementioned range listed, please consider follow up with your Primary Care Provider.  If you are age 61 or younger, your body mass index should be between 19-25. Your Body mass index is 38.11 kg/m. If this is out of the aformentioned range listed, please consider follow up with your Primary Care Provider.   You have been scheduled for an endoscopy and colonoscopy. Please follow the written instructions given to you at your visit today. Please pick up your prep supplies at the pharmacy within the next 1-3 days. If you use inhalers (even only as needed), please bring them with you on the day of your procedure. Your physician has requested that you go to www.startemmi.com and enter the access code given to you at your visit today. This web site gives a general overview about your procedure. However, you should still follow specific instructions given to you by our office regarding your preparation for the procedure.  It was a pleasure to see you today!

## 2019-03-06 ENCOUNTER — Telehealth: Payer: Self-pay

## 2019-03-06 NOTE — Telephone Encounter (Signed)
Prior Auth for zofran denied via CoverMyMeds.  Called pharmacy - they were able to "override" and pt only has to pay $1.03.

## 2019-03-12 ENCOUNTER — Ambulatory Visit (INDEPENDENT_AMBULATORY_CARE_PROVIDER_SITE_OTHER)
Admission: RE | Admit: 2019-03-12 | Discharge: 2019-03-12 | Disposition: A | Discharge status: Home / Self Care | Payer: Self-pay | Source: Ambulatory Visit | Attending: Physician Assistant | Admitting: Physician Assistant

## 2019-03-12 ENCOUNTER — Telehealth: Payer: Self-pay | Admitting: *Deleted

## 2019-03-12 ENCOUNTER — Other Ambulatory Visit: Payer: Self-pay

## 2019-03-12 DIAGNOSIS — E785 Hyperlipidemia, unspecified: Secondary | ICD-10-CM

## 2019-03-12 NOTE — Telephone Encounter (Signed)
Noted  

## 2019-03-12 NOTE — Telephone Encounter (Signed)
-----   Message from Rise Mu, PA-C sent at 03/12/2019 12:59 PM EDT ----- Calcium score of 0 with calcification noted in the ascending aorta and mitral valve. Recommend retrial of Crestor if she has not already done so.  She could also try to take this 3 days/week and see if she tolerates.

## 2019-03-12 NOTE — Telephone Encounter (Signed)
I spoke with pt and reviewed results and recommendations with her. She states she cannot take statins and does not want to start at this time.  She may consider trying Crestor 3 days a week but she states plan was to do follow up lab work after CT scan.  She would like to do this at primary care. She will contact primary care to arrange labs.

## 2019-03-22 ENCOUNTER — Encounter: Payer: Medicare HMO | Admitting: Internal Medicine

## 2019-03-22 ENCOUNTER — Ambulatory Visit: Payer: Medicare HMO

## 2019-03-27 ENCOUNTER — Ambulatory Visit (INDEPENDENT_AMBULATORY_CARE_PROVIDER_SITE_OTHER): Payer: Medicare HMO

## 2019-03-27 ENCOUNTER — Other Ambulatory Visit: Payer: Self-pay

## 2019-03-27 DIAGNOSIS — Z23 Encounter for immunization: Secondary | ICD-10-CM | POA: Diagnosis not present

## 2019-04-03 ENCOUNTER — Encounter: Payer: Medicare HMO | Admitting: Internal Medicine

## 2019-04-19 ENCOUNTER — Telehealth (INDEPENDENT_AMBULATORY_CARE_PROVIDER_SITE_OTHER): Payer: Medicare HMO | Admitting: Family Medicine

## 2019-04-19 ENCOUNTER — Other Ambulatory Visit: Payer: Self-pay

## 2019-04-19 DIAGNOSIS — Z20828 Contact with and (suspected) exposure to other viral communicable diseases: Secondary | ICD-10-CM | POA: Diagnosis not present

## 2019-04-19 DIAGNOSIS — Z20822 Contact with and (suspected) exposure to covid-19: Secondary | ICD-10-CM

## 2019-04-19 DIAGNOSIS — J069 Acute upper respiratory infection, unspecified: Secondary | ICD-10-CM
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
## 2019-04-24 ENCOUNTER — Encounter: Payer: Self-pay | Admitting: Internal Medicine

## 2019-04-26 ENCOUNTER — Encounter: Payer: Self-pay | Admitting: Internal Medicine

## 2019-04-26 ENCOUNTER — Other Ambulatory Visit: Payer: Self-pay

## 2019-04-26 ENCOUNTER — Ambulatory Visit (AMBULATORY_SURGERY_CENTER): Payer: Medicare HMO | Admitting: Internal Medicine

## 2019-04-26 VITALS — BP 120/63 | HR 57 | Temp 98.4°F | Resp 18 | Ht 65.0 in | Wt 229.0 lb

## 2019-04-26 DIAGNOSIS — R1310 Dysphagia, unspecified: Secondary | ICD-10-CM

## 2019-04-26 DIAGNOSIS — K219 Gastro-esophageal reflux disease without esophagitis: Secondary | ICD-10-CM | POA: Diagnosis not present

## 2019-04-26 DIAGNOSIS — Z800 Family history of malignant neoplasm of digestive organs: Secondary | ICD-10-CM

## 2019-04-26 DIAGNOSIS — Z1509 Genetic susceptibility to other malignant neoplasm: Secondary | ICD-10-CM | POA: Diagnosis not present

## 2019-04-26 DIAGNOSIS — Z1211 Encounter for screening for malignant neoplasm of colon: Secondary | ICD-10-CM | POA: Diagnosis not present

## 2019-04-26 MED ORDER — SODIUM CHLORIDE 0.9 % IV SOLN: 500.0000 mL | Freq: Once | INTRAVENOUS | Status: DC

## 2019-04-26 NOTE — Patient Instructions (Addendum)
PLEASE READ THE HANDOUTS PROVIDED ON POST ESOPHAGEAL DILATION AND STRICTURE  Both tests look good. No polyps, no cancer.  I dilated the esophagus since that has helped in the past.  Hope it does again.  Regarding the family history of Lynch syndrome - I will refer you to the genetics counselor to review and better determine your situation and cancer risks.  I appreciate the opportunity to care for you. Gatha Mayer, MD, FACG  YOU HAD AN ENDOSCOPIC PROCEDURE TODAY AT [facility]:   Refer to the procedure report that was given to you for any specific questions about what was found during the examination.  If the procedure report does not answer your questions, please call your gastroenterologist to clarify.  If you requested that your care partner not be given the details of your procedure findings, then the procedure report has been included in a sealed envelope for you to review at your convenience later.  YOU SHOULD EXPECT: Some feelings of bloating in the abdomen. Passage of more gas than usual.  Walking can help get rid of the air that was put into your GI tract during the procedure and reduce the bloating. If you had a lower endoscopy (such as a colonoscopy or flexible sigmoidoscopy) you may notice spotting of blood in your stool or on the toilet paper. If you underwent a bowel prep for your procedure, you may not have a normal bowel movement for a few days.  Please Note:  You might notice some irritation and congestion in your nose or some drainage.  This is from the oxygen used during your procedure.  There is no need for concern and it should clear up in a day or so.  SYMPTOMS TO REPORT IMMEDIATELY:   Following lower endoscopy (colonoscopy or flexible sigmoidoscopy):  Excessive amounts of blood in the stool  Significant tenderness or worsening of abdominal pains  Swelling of the abdomen that is new, acute  Fever of 100F or higher   Following upper endoscopy  (EGD)  Vomiting of blood or coffee ground material  New chest pain or pain under the shoulder blades  Painful or persistently difficult swallowing  New shortness of breath  Fever of 100F or higher  Black, tarry-looking stools  For urgent or emergent issues, a gastroenterologist can be reached at any hour by calling [phone].   DIET:  We do recommend you follow a clear liquid diet for 1 hour then a soft diet for the rest of the day.  Tomorrow start with a small meal at first, but then you may proceed to your regular diet.  Drink plenty of fluids but you should avoid alcoholic beverages for 24 hours.  ACTIVITY:  You should plan to take it easy for the rest of today and you should NOT DRIVE or use heavy machinery until tomorrow (because of the sedation medicines used during the test).    FOLLOW UP: Our staff will call the number listed on your records 48-72 hours following your procedure to check on you and address any questions or concerns that you may have regarding the information given to you following your procedure. If we do not reach you, we will leave a message.  We will attempt to reach you two times.  During this call, we will ask if you have developed any symptoms of COVID 19. If you develop any symptoms (ie: fever, flu-like symptoms, shortness of breath, cough etc.) before then, please call [phone].  If you  test positive for Covid 19 in the 2 weeks post procedure, please call and report this information to [place].    If any biopsies were taken you will be contacted by phone or by letter within the next 1-3 weeks.  Please call us at [phone] if you have not heard about the biopsies in 3 weeks.    SIGNATURES/CONFIDENTIALITY: You and/or your care partner have signed paperwork which will be entered into your electronic medical record.  These signatures attest to the fact that that the information above on your After Visit Summary has been reviewed and is understood.  Full  responsibility of the confidentiality of this discharge information lies with you and/or your care-partner.

## 2019-04-26 NOTE — Progress Notes (Signed)
Called to room to assist during endoscopic procedure.  Patient ID and intended procedure confirmed with present staff. Received instructions for my participation in the procedure from the performing physician.  

## 2019-04-26 NOTE — Progress Notes (Signed)
A/ox3, pleased with MAC, report to RN 

## 2019-04-26 NOTE — Op Note (Signed)
[facility] Patient Name: Tracey Wiley Procedure Date: 04/26/2019 1:26 PM MRN: [id number] Endoscopist: Gatha Mayer , MD Age: 63 Referring MD:  Date of Birth: June 26, 1955 Gender: Female Account #: [id number] Procedure:                Upper GI endoscopy Indications:              Dysphagia Medicines:                Propofol per Anesthesia, Monitored Anesthesia Care Procedure:                Pre-Anesthesia Assessment:                           - Prior to the procedure, a History and Physical                            was performed, and patient medications and                            allergies were reviewed. The patient's tolerance of                            previous anesthesia was also reviewed. The risks                            and benefits of the procedure and the sedation                            options and risks were discussed with the patient.                            All questions were answered, and informed consent                            was obtained. Prior Anticoagulants: The patient has                            taken no previous anticoagulant or antiplatelet                            agents. ASA Grade Assessment: III - A patient with                            severe systemic disease. After reviewing the risks                            and benefits, the patient was deemed in                            satisfactory condition to undergo the procedure.                           After obtaining informed consent, the endoscope was  passed under direct vision. Throughout the                            procedure, the patient's blood pressure, pulse, and                            oxygen saturations were monitored continuously. The                            Endoscope was introduced through the mouth, and                            advanced to the second part of duodenum. The upper                            GI endoscopy was  accomplished without difficulty.                            The patient tolerated the procedure well. Scope In: Scope Out: Findings:                 The esophagus was normal.                           The stomach was normal.                           The examined duodenum was normal.                           The cardia and gastric fundus were normal on                            retroflexion.                           The scope was withdrawn. Dilation was performed in                            the entire esophagus with a Maloney dilator with no                            resistance at 58 Fr. Estimated blood loss: none. Complications:            No immediate complications. Estimated Blood Loss:     Estimated blood loss: none. Impression:               - Normal esophagus.                           - Normal stomach.                           - Normal examined duodenum.                           - Dilation performed in the entire esophagus.                           -  No specimens collected. Recommendation:           - Patient has a contact number available for                            emergencies. The signs and symptoms of potential                            delayed complications were discussed with the                            patient. Return to normal activities tomorrow.                            Written discharge instructions were provided to the                            patient.                           - Clear liquids x 1 hour then soft foods rest of                            day. Start prior diet tomorrow.                           - Continue present medications.                           - See the other procedure note for documentation of                            additional recommendations. Gatha Mayer, MD 04/26/2019 2:12:51 PM This report has been signed electronically.

## 2019-04-26 NOTE — Op Note (Addendum)
[facility] Patient Name: Tracey Wiley Procedure Date: 04/26/2019 1:26 PM MRN: [id number] Endoscopist: Gatha Mayer , MD Age: 63 Referring MD:  Date of Birth: August 11, 1955 Gender: Female Account #: [id number] Procedure:                Colonoscopy Indications:              Screening in patient at increased risk: Colorectal                            cancer in sister before age 79, Colon cancer                            screening in patient at increased risk: Family                            history of hereditary nonpolyposis colorectal                            cancer in 1st-degree relative Medicines:                Propofol per Anesthesia, Monitored Anesthesia Care Procedure:                Pre-Anesthesia Assessment:                           - Prior to the procedure, a History and Physical                            was performed, and patient medications and                            allergies were reviewed. The patient's tolerance of                            previous anesthesia was also reviewed. The risks                            and benefits of the procedure and the sedation                            options and risks were discussed with the patient.                            All questions were answered, and informed consent                            was obtained. Prior Anticoagulants: The patient has                            taken no previous anticoagulant or antiplatelet                            agents. ASA Grade Assessment: III - A patient with  severe systemic disease. After reviewing the risks                            and benefits, the patient was deemed in                            satisfactory condition to undergo the procedure.                           After obtaining informed consent, the colonoscope                            was passed under direct vision. Throughout the                            procedure, the  patient's blood pressure, pulse, and                            oxygen saturations were monitored continuously. The                            Colonoscope was introduced through the anus and                            advanced to the the cecum, identified by                            appendiceal orifice and ileocecal valve. The                            colonoscopy was performed without difficulty. The                            patient tolerated the procedure well. The quality                            of the bowel preparation was excellent. The bowel                            preparation used was Miralax via split dose                            instruction. The appendiceal orifice and the rectum                            were photographed. Scope In: 1:48:31 PM Scope Out: 2:02:08 PM Scope Withdrawal Time: 0 hours 11 minutes 18 seconds  Total Procedure Duration: 0 hours 13 minutes 37 seconds  Findings:                 The perianal and digital rectal examinations were                            normal.  The entire examined colon appeared normal on direct                            and retroflexion views. Complications:            No immediate complications. Estimated Blood Loss:     Estimated blood loss: none. Impression:               - The entire examined colon is normal on direct and                            retroflexion views.                           - No specimens collected. Recommendation:           - Patient has a contact number available for                            emergencies. The signs and symptoms of potential                            delayed complications were discussed with the                            patient. Return to normal activities tomorrow.                            Written discharge instructions were provided to the                            patient.                           - Clear liquids x 1 hour then soft foods rest  of                            day. Start prior diet tomorrow.                           - Repeat colonoscopy in 1 years possibly for                            screening purposes. She had a sistyer die from [place]                            at 32 after dx at 72 and says she had [employer] -= thinks she herself had [facility]                            testing 5 yrs ago negatibe  Will refer to genetics counselor to be evaluated                            and determine her situation better - also recll EGD                            2 years May adjust this after I get report from                            Eek E Gessner, MD 04/26/2019 2:17:58 PM This report has been signed electronically.

## 2019-04-30 ENCOUNTER — Telehealth: Payer: Self-pay | Admitting: *Deleted

## 2019-04-30 ENCOUNTER — Telehealth: Payer: Self-pay

## 2019-04-30 ENCOUNTER — Other Ambulatory Visit: Payer: Self-pay | Admitting: Internal Medicine

## 2019-04-30 DIAGNOSIS — R531 Weakness: Secondary | ICD-10-CM

## 2019-04-30 DIAGNOSIS — R5383 Other fatigue: Secondary | ICD-10-CM

## 2019-04-30 DIAGNOSIS — Z800 Family history of malignant neoplasm of digestive organs: Secondary | ICD-10-CM

## 2019-04-30 NOTE — Telephone Encounter (Signed)
Left message for patient to call back Genetics referral placed

## 2019-04-30 NOTE — Telephone Encounter (Signed)
No answer, left message to call back if having any issues or concerns, B.Cathleen Yagi RN. 

## 2019-04-30 NOTE — Telephone Encounter (Signed)
It would be unusual to be the prep still - but I suppose persistent electrolyte abnormalities could play a role or something else so I have ordered a CBC and CMET if she will do that tomorrow in our lab  Sorry she is so tired

## 2019-04-30 NOTE — Telephone Encounter (Signed)
Pt states "I am listless and lethargic after this procedure.  I am just so darn tired."  She says she has had many procedures, but has not "been able to bounce back like I have before."   She states she feels it might be the prep- she feels like it was more difficult than any other prep.  "I was awake for 52 hours straight."  She states she hasn't recovered from the prep and is concerned about this.  No new symptoms except lethargy- no fever or SOB, eating ok, sleeping at night.  She is extremely concerned about this.  She states she is very anxious about the holiday coming up d/t feeling so tired.    Dr. Carlean Purl, Please advise if you'd like me to suggest anything for you.    Thanks, [employer]

## 2019-04-30 NOTE — Telephone Encounter (Signed)
No answer for first post op procedure followup. Will call back this afternoon. SM

## 2019-05-01 ENCOUNTER — Telehealth: Payer: Self-pay | Admitting: Genetic Counselor

## 2019-05-01 NOTE — Telephone Encounter (Signed)
A genetic counseling appt has been scheduled for Tracey Wiley to see Clint Guy for fhx of colon cancer on 06/12/19 at 1pm. Pt aware to arrive 15 minutes early.

## 2019-05-01 NOTE — Telephone Encounter (Signed)
Spoke with pt and she states she will try to get to the lab as soon as she can.

## 2019-05-02 NOTE — Telephone Encounter (Signed)
Patient has been scheduled for January for genetics

## 2019-05-04 ENCOUNTER — Other Ambulatory Visit
Admission: RE | Admit: 2019-05-04 | Discharge: 2019-05-04 | Disposition: A | Discharge status: Home / Self Care | Payer: Medicare HMO | Source: Ambulatory Visit | Attending: Internal Medicine | Admitting: Internal Medicine

## 2019-05-04 ENCOUNTER — Ambulatory Visit
Admission: RE | Admit: 2019-05-04 | Discharge: 2019-05-04 | Disposition: A | Discharge status: Home / Self Care | Payer: Medicare HMO | Source: Ambulatory Visit | Attending: Family Medicine | Admitting: Family Medicine

## 2019-05-04 DIAGNOSIS — R5383 Other fatigue: Secondary | ICD-10-CM | POA: Insufficient documentation

## 2019-05-04 DIAGNOSIS — R531 Weakness: Secondary | ICD-10-CM

## 2019-05-04 DIAGNOSIS — J069 Acute upper respiratory infection, unspecified: Secondary | ICD-10-CM | POA: Diagnosis present

## 2019-05-04 DIAGNOSIS — R05 Cough: Secondary | ICD-10-CM | POA: Diagnosis not present

## 2019-05-04 DIAGNOSIS — Z20828 Contact with and (suspected) exposure to other viral communicable diseases: Secondary | ICD-10-CM | POA: Diagnosis not present

## 2019-05-04 DIAGNOSIS — Z20822 Contact with and (suspected) exposure to covid-19: Secondary | ICD-10-CM

## 2019-05-04 LAB — COMPREHENSIVE METABOLIC PANEL
ALT: 17 U/L (ref 0–44)
AST: 22 U/L (ref 15–41)
Albumin: 3.8 g/dL (ref 3.5–5.0)
Alkaline Phosphatase: 68 U/L (ref 38–126)
Anion gap: 10 (ref 5–15)
BUN: 14 mg/dL (ref 8–23)
CO2: 28 mmol/L (ref 22–32)
Calcium: 8.9 mg/dL (ref 8.9–10.3)
Chloride: 101 mmol/L (ref 98–111)
Creatinine, Ser: 0.7 mg/dL (ref 0.44–1.00)
GFR calc Af Amer: >60 mL/min (ref >60)
GFR calc non Af Amer: >60 mL/min (ref >60)
Glucose, Bld: 103 mg/dL — ABNORMAL HIGH (ref 70–99)
Potassium: 4.2 mmol/L (ref 3.5–5.1)
Sodium: 139 mmol/L (ref 135–145)
Total Bilirubin: 0.6 mg/dL (ref 0.3–1.2)
Total Protein: 7.5 g/dL (ref 6.5–8.1)

## 2019-05-04 LAB — CBC
HCT: 39.1 % (ref 36.0–46.0)
Hemoglobin: 12.8 g/dL (ref 12.0–15.0)
MCH: 26.8 pg (ref 26.0–34.0)
MCHC: 32.7 g/dL (ref 30.0–36.0)
MCV: 82 fL (ref 80.0–100.0)
Platelets: 302 10*3/uL (ref 150–400)
RBC: 4.77 MIL/uL (ref 3.87–5.11)
RDW: 15.6 % — ABNORMAL HIGH (ref 11.5–15.5)
WBC: 8.8 10*3/uL (ref 4.0–10.5)
nRBC: 0 % (ref 0.0–0.2)

## 2019-05-07 ENCOUNTER — Telehealth: Payer: Self-pay | Admitting: *Deleted

## 2019-05-07 MED ORDER — BENZONATATE 200 MG PO CAPS: 200.0000 mg | ORAL_CAPSULE | Freq: Three times a day (TID) | ORAL | 2 refills | Status: DC | PRN

## 2019-05-07 NOTE — Progress Notes (Signed)
Her labs were all ok - hope her fatigue post-colonoscopy  is better If not she should check with PCP

## 2019-05-07 NOTE — Telephone Encounter (Signed)
Looking in the office note from Philis Nettle, NP I do not see where she said she would call in tessalon pearls.

## 2019-05-07 NOTE — Telephone Encounter (Signed)
Copied from [place] [id number]. Topic: General - Inquiry >> May 07, 2019  2:19 PM Richardo Priest, [place] wrote: Reason for CRM: Patient called in stating she was supposed to receive medication for her cough. Pt states it was supposed to be sent in on the 12th from [facility]. Pt states she was going to call in Tessalon pearls and pharmacy has sent multiple requests to office with no response. Please advise.

## 2019-05-07 NOTE — Telephone Encounter (Signed)
Copied from [place] [id number]. Topic: Complaint - Care >> May 07, 2019  2:26 PM Richardo Priest, [place] wrote: Date of Incident: 04/19/2019 Details of complaint: P had virtual visit with Philis Nettle and pt feels as if she was dismissed the whole time. Pt felt it may have been her being sick, but after her husband witnessed the care his wife received, even he was disappointed and shocked by the way the provider was treating patient. 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 Pt still has not received medication that was supposed to be sent in and has been going on with her sickness for 3 weeks.  How would the patient like to see it resolved? Pt would like call from Fre[employer]. On a scale of 1-10, how was your experience? Patient states it was a disservice and felt she was treated horrifically after being her 10+ years What would it take to bring it to a 10? Pt did not express this and would like to discuss further with practice admin or manager.   Route to Engineer, building services.

## 2019-05-07 NOTE — Telephone Encounter (Signed)
Tessalon sent

## 2019-05-10 NOTE — Telephone Encounter (Signed)
Patient called to say that she is waiting on someone to give her a call about the Chest Xray that was done on 05/04/2019 said that she spoke to someone but she was not given a proper explanation of the X-ray results. Patient also states that she is waiting on a call from [place] to discuss an issue. Patient can be reached at Ph#  [phone]

## 2019-05-22 DIAGNOSIS — Z800 Family history of malignant neoplasm of digestive organs: Secondary | ICD-10-CM | POA: Diagnosis not present

## 2019-05-24 ENCOUNTER — Telehealth: Payer: Self-pay | Admitting: Cardiovascular Disease

## 2019-05-24 DIAGNOSIS — E785 Hyperlipidemia, unspecified: Secondary | ICD-10-CM

## 2019-05-24 NOTE — Telephone Encounter (Signed)
Patient calling  Would like to clarify with nurse - needs repeat labs before next appointment with Dr Rockey Situ  Patient is scheduled 06/25/19 with Dr Rockey Situ - would like done prior Please call to discuss

## 2019-05-24 NOTE — Telephone Encounter (Signed)
Patient last seen by Christell Faith, PA on 01/26/2019. Will forward to Dr. Rockey Situ to see if he would like anything besides a lipid panel done prior to her follow up appointment with him on 06/25/19.

## 2019-05-28 ENCOUNTER — Other Ambulatory Visit: Payer: Self-pay | Admitting: Cardiovascular Disease

## 2019-05-28 NOTE — Telephone Encounter (Signed)
Patient calling to check on status  Patient wanted to make office aware that she has not been on a statin since Aug 2019  Patient states it is very critical for labs to be checked every 4 months for insurance to pay for a non statin medication

## 2019-05-28 NOTE — Telephone Encounter (Signed)
*  STAT* If patient is at the pharmacy, call can be transferred to refill team.   1. Which medications need to be refilled? (please list name of each medication and dose if known) triamterene-hydrochlorothiazide    2. Which pharmacy/location (including street and city if local pharmacy) is medication to be sent to? [facility] in [place]    3. Do they need a 30 day or 90 day supply? 90 days   Patient very adamant she needs by tomorrow

## 2019-05-28 NOTE — Telephone Encounter (Signed)
Requested Prescriptions   Signed Prescriptions Disp Refills  . triamterene-hydrochlorothiazide (DYAZIDE) 37.5-25 MG capsule 90 capsule 0    Sig: TAKE 1 CAPSULE BY MOUTH EVERY DAY    Authorizing Provider: Minna Merritts    Ordering User: NEWCOMER MCCLAIN, Demarea Lorey L

## 2019-05-29 NOTE — Telephone Encounter (Signed)
Routing to MD again.

## 2019-06-04 ENCOUNTER — Telehealth: Payer: Self-pay | Admitting: Internal Medicine

## 2019-06-04 NOTE — Telephone Encounter (Signed)
This required just an appoint ment to be scheduled with PCP, appointment scheduled for 06/07/19

## 2019-06-04 NOTE — Telephone Encounter (Signed)
Pt called to return Nina's call about an xray  and would also like a call back from [facility]

## 2019-06-05 NOTE — Telephone Encounter (Signed)
We can order a lipid panel  thx

## 2019-06-06 NOTE — Telephone Encounter (Signed)
Lab order placed for the [facility] mall for fasting cholesterol panel.   No further questions or concerns at this time.   Advised pt to call for any further questions or concerns.

## 2019-06-07 ENCOUNTER — Ambulatory Visit (INDEPENDENT_AMBULATORY_CARE_PROVIDER_SITE_OTHER): Payer: Medicare HMO | Admitting: Internal Medicine

## 2019-06-07 ENCOUNTER — Other Ambulatory Visit: Payer: Self-pay

## 2019-06-07 ENCOUNTER — Telehealth: Payer: Self-pay | Admitting: Genetic Counselor

## 2019-06-07 VITALS — Ht 65.0 in | Wt 229.0 lb

## 2019-06-07 DIAGNOSIS — J22 Unspecified acute lower respiratory infection: Secondary | ICD-10-CM | POA: Diagnosis not present

## 2019-06-07 DIAGNOSIS — R05 Cough: Secondary | ICD-10-CM

## 2019-06-07 DIAGNOSIS — R058 Other specified cough: Secondary | ICD-10-CM

## 2019-06-07 MED ORDER — LEVALBUTEROL TARTRATE 45 MCG/ACT IN AERO: 1.0000 | INHALATION_SPRAY | Freq: Four times a day (QID) | RESPIRATORY_TRACT | 12 refills | Status: DC | PRN

## 2019-06-07 MED ORDER — PREDNISONE 10 MG PO TABS: ORAL_TABLET | ORAL | 0 refills | Status: DC

## 2019-06-07 MED ORDER — AMOXICILLIN-POT CLAVULANATE 875-125 MG PO TABS: 1.0000 | ORAL_TABLET | Freq: Two times a day (BID) | ORAL | 0 refills | Status: DC

## 2019-06-07 NOTE — Telephone Encounter (Signed)
Called patient regarding upcoming virtual visit, patient is notified. 
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
## 2019-06-10 DIAGNOSIS — J22 Unspecified acute lower respiratory infection: Secondary | ICD-10-CM | POA: Insufficient documentation

## 2019-06-10 NOTE — Assessment & Plan Note (Signed)
Empiric treatment with augmentin , xopenex,  Cough suppressant, and prednisone taper.  Repeat chest x ray ordered and COVID TESTING recommended.

## 2019-06-11 ENCOUNTER — Ambulatory Visit
Admission: RE | Admit: 2019-06-11 | Discharge: 2019-06-11 | Disposition: A | Discharge status: Home / Self Care | Payer: Medicare HMO | Source: Ambulatory Visit | Attending: Internal Medicine | Admitting: Internal Medicine

## 2019-06-11 ENCOUNTER — Ambulatory Visit: Payer: Medicare HMO | Attending: Internal Medicine

## 2019-06-11 DIAGNOSIS — R05 Cough: Secondary | ICD-10-CM

## 2019-06-11 DIAGNOSIS — Z20822 Contact with and (suspected) exposure to covid-19: Secondary | ICD-10-CM | POA: Diagnosis not present

## 2019-06-11 DIAGNOSIS — R058 Other specified cough: Secondary | ICD-10-CM

## 2019-06-12 ENCOUNTER — Inpatient Hospital Stay: Payer: Medicare HMO | Attending: Genetic Counselor | Admitting: Genetic Counselor

## 2019-06-12 ENCOUNTER — Other Ambulatory Visit: Payer: Medicare HMO

## 2019-06-12 ENCOUNTER — Encounter: Payer: Self-pay | Admitting: Genetic Counselor

## 2019-06-12 DIAGNOSIS — Z1379 Encounter for other screening for genetic and chromosomal anomalies: Secondary | ICD-10-CM | POA: Diagnosis not present

## 2019-06-12 DIAGNOSIS — Z803 Family history of malignant neoplasm of breast: Secondary | ICD-10-CM | POA: Insufficient documentation

## 2019-06-12 DIAGNOSIS — Z800 Family history of malignant neoplasm of digestive organs: Secondary | ICD-10-CM | POA: Diagnosis not present

## 2019-06-12 LAB — NOVEL CORONAVIRUS, NAA: SARS-CoV-2, NAA: NOT DETECTED
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
## 2019-06-13 ENCOUNTER — Telehealth: Payer: Self-pay | Admitting: *Deleted

## 2019-06-13 NOTE — Telephone Encounter (Signed)
Patient given negative covid results . 

## 2019-06-15 ENCOUNTER — Telehealth: Payer: Self-pay | Admitting: Genetic Counselor

## 2019-06-15 NOTE — Telephone Encounter (Signed)
Followed up with Tracey Wiley regarding genetic test results from approximately 2016 that we are unable to find. Several genetic testing laboratories ([facility], [facility], [facility], [facility]) declined having records of her having genetic testing around that time. Confirmed with Ms. Mago that she did not go by a different name around that time. She will check her calendars that she has kept for the past several years to see if she can find additional information about if and what genetic test she may have had. If she is unable to find relevant information, we will plan to proceed with additional genetic testing that will include colon cancer genes that were not previously included in the genetic testing that she had in 2006 and 2012.  We will reach out to her next Friday (1/15) to follow-up and determine if additional genetic testing will be ordered.

## 2019-06-19 NOTE — Progress Notes (Signed)
Cardiology Office Note  Date:  06/25/2019   ID:  Tracey Wiley, DOB 1956-03-20, MRN [id number]  PCP:  Crecencio Mc, MD   Chief Complaint  Patient presents with  . other    3 month f/u no complaints today. Meds reviewed verbally with pt.    HPI:  Tracey Wiley is a very pleasant 64 year old woman with a  history of  PFO,  with  closure device in April 2008 performed in [place], hyperlipidemia,  PAT versus paroxysmal atrial fibrillation on sotalol, hypertension,  Bell's palsy gout GERD CT coronary calcium score 0 in 2015 Significant baseline stress, continues to care for her ex-husband who has underlying cognitive issues who presents today for follow-up of her arrhythmia  Getting over URI, 10 wk Started on augmentin, inhaler, prednisone covid neg Now feeling better  Labs reviewed Total chol 257, LDL 165 Off zocor, myalgias better,  Also had myalgias on Lipitor  Reports having some issues with arthritis No regular exercise program Weight has been trending up  CT coronary calcium score images pulled up and reviewed with her, minimal plaque in the aorta, no significant coronary calcification  Denies having significant tachycardia or palpitations Takes sotalol 40 mg QID Leg tenderness Occasional lower extremity edema above the sock line, very mild  EKG personally reviewed by myself on todays visit Shows normal sinus rhythm with rate 67 bpm no significant ST or T-wave changes  Other past medical history reviewed Previously mentioned having a problem with her uterus, was scheduled for hysterectomy but this has been canceled in the setting of her inflammatory issue   lost her mother in February 2015.  History of gout in her wrists. Improvement with colchicine. Indomethacin upset her stomach  PMH:   has a past medical history of Adenomyosis, Anxiety, Arthritis, Asthma, Bell's palsy, Cervicalgia, Chest pain, atypical, DVT (deep vein thrombosis) in pregnancy, Dysrhythmia,  Esophageal stricture, Esophagitis, Facial paralysis/Bells palsy (10/29/2014), Family history of breast cancer, Family history of colon cancer, Family history of Lynch syndrome, Family history of stomach cancer, FHx: ovarian cancer, Fibroids, Fistula, labyrinthine (1994), Gastroesophageal reflux disease, Generalized tonic-clonic seizure ([place]) (2002), 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 (2000), Gout, Headache, Heart disease, 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  PSH:    Past Surgical History:  Procedure Laterality Date  . BREAST BIOPSY Right 2009   lymphoid and fibroadipose tissue  . COLONOSCOPY  08-2014  . DILATION AND CURETTAGE OF UTERUS    . ESOPHAGOGASTRODUODENOSCOPY    . HYSTEROSCOPY WITH D & C  01/20/2015   Procedure: DILATATION AND CURETTAGE /HYSTEROSCOPY;  Surgeon: Brayton Mars, MD;  Location: [facility] ORS;  Service: Gynecology;;  . [facility] EAR SURGERY     x 4  . LAPAROSCOPY  01/20/2015   Procedure: LAPAROSCOPY DIAGNOSTIC;  Surgeon: Brayton Mars, MD;  Location: [facility] OR[facility];  Service: Gynecology;;  . NASAL SEPTUM SURGERY    . PATENT FORAMEN OVALE CLOSURE  April 2008  . TMJ x 2    . uterine ablation  March 2008    Current Outpatient Medications  Medication Sig Dispense Refill  . acetaminophen (TYLENOL) 500 MG tablet Take 500 mg by  mouth daily as needed for pain.    Marland Kitchen amoxicillin (AMOXIL) 500 MG capsule TAKE 4 CAPSULES BY MOUTH 1 HOUR PRIOR TO PROCEDURE 12 capsule 1  . aspirin EC 81 MG tablet  Take 81 mg by mouth daily.    . calcium-vitamin D 250-100 MG-UNIT tablet Take 1 tablet by mouth 2 (two) times daily.    . celecoxib (CELEBREX) 200 MG capsule TAKE 1 CAPSULE BY MOUTH TWICE A DAY (Patient taking differently: as needed. ) 60 capsule 2  . chlorhexidine (PERIDEX) 0.12 % solution GARGLE AND SPIT 15 MLS IN THE MOUTH OR THROAT PRN. 473 mL 0  . Echinacea 125 MG CAPS Take by mouth.    . esomeprazole (NEXIUM) 40 MG capsule Take 1 capsule (40 mg total) by mouth daily. 90 capsule 1  . hyoscyamine (LEVSIN SL) 0.125 MG SL tablet Place 1 tablet (0.125 mg total) under the tongue every 4 (four) hours as needed for cramping. 60 tablet 3  . ibuprofen (ADVIL,MOTRIN) 200 MG tablet Take by mouth as needed.     . levalbuterol (XOPENEX HFA) 45 MCG/ACT inhaler Inhale 1-2 puffs into the lungs every 6 (six) hours as needed for wheezing. 1 Inhaler 12  . magnesium gluconate (MAGONATE) 500 MG tablet Take 500 mg by mouth 2 (two) times daily.    . ondansetron (ZOFRAN) 4 MG tablet Take 1 tablet (4 mg total) by mouth every 6 (six) hours as needed for nausea or vomiting. 10 tablet 0  . sotalol (BETAPACE) 80 MG tablet TAKE 1 TABLET (80 MG TOTAL) BY MOUTH 2 (TWO) TIMES DAILY. 60 tablet 3  . triamterene-hydrochlorothiazide (DYAZIDE) 37.5-25 MG capsule TAKE 1 CAPSULE BY MOUTH EVERY DAY 90 capsule 0  . valACYclovir (VALTREX) 1000 MG tablet TAKE 1 TABLET BY MOUTH DAILY FOR SUPPRESSION , OR 5 TIMES DAILY FOR FLARE 35 tablet 2  . vitamin C (ASCORBIC ACID) 500 MG tablet Take 500 mg by mouth daily.    Marland Kitchen zinc gluconate 50 MG tablet Take 50 mg by mouth daily.    Marland Kitchen albuterol (PROVENTIL HFA;VENTOLIN HFA) 108 (90 Base) MCG/ACT inhaler Inhale 2 puffs into the lungs every 6 (six) hours as needed for wheezing or shortness of breath. (Patient not taking: Reported on 06/25/2019) 1 Inhaler 0   No current facility-administered medications for this visit.     Allergies:   2,4-d dimethylamine (amisol); Clarithromycin; Nitrofurantoin  monohyd macro; Other; Azithromycin; Ciprofloxacin; Codeine; Erythromycin base; Hydrocodone-acetaminophen; Morphine; Nitrofurantoin; Pamelor [nortriptyline hcl]; Stadol [butorphanol]; Talwin [pentazocine]; Topamax [topiramate]; Wellbutrin [bupropion]; Zanaflex [tizanidine hcl]; Lamictal [lamotrigine]; and Macrobid [nitrofurantoin macrocrystal]   Social History:  The patient  reports that she has never smoked. She has never used smokeless tobacco. She reports that she does not drink alcohol or use drugs.   Family History:   family history includes Breast cancer in her paternal aunt and paternal aunt; Cancer (age of onset: 37) in her mother; Colon cancer (age of onset: 16) in her sister; Heart failure in her father; Hyperlipidemia in her mother; Hypertension in her mother; Hypothyroidism in her mother; Kidney disease in her mother; Ovarian cancer in an other family member; Stomach cancer in her maternal grandmother.    Review of Systems: Review of Systems  Constitutional: Negative.        Weight gain  HENT: Negative.   Respiratory: Negative.   Cardiovascular: Positive for leg swelling.  Gastrointestinal: Negative.   Musculoskeletal: Negative.   Neurological: Negative.   Psychiatric/Behavioral: Negative.   All other systems reviewed and are negative.  PHYSICAL EXAM: VS:  BP 130/70 (BP Location: Left Arm, Patient Position: Sitting, Cuff Size: Large)   Pulse 67   Ht 5\' 5"  (1.651 m)   Wt 228 lb 12 oz (103.8 kg)   SpO2 98%   BMI 38.07 kg/m  , BMI Body mass index is 38.07 kg/m. Constitutional:  oriented to person, place, and time. No distress.  HENT:  Head: Grossly normal Eyes:  no discharge. No scleral icterus.  Neck: No JVD, no carotid bruits  Cardiovascular: Regular rate and rhythm, no murmurs appreciated Pulmonary/Chest: Clear to auscultation bilaterally, no wheezes or rails Abdominal: Soft.  no distension.  no tenderness.  Musculoskeletal: Normal range of motion Neurological:   normal muscle tone. Coordination normal. No atrophy Skin: Skin warm and dry Psychiatric: normal affect, pleasant   Recent Labs: 01/23/2019: TSH 2.48 05/04/2019: ALT 17; BUN 14; Creatinine, Ser 0.70; Hemoglobin 12.8; Platelets 302; Potassium 4.2; Sodium 139    Lipid Panel Lab Results  Component Value Date   CHOL 257 (H) 06/21/2019   HDL 37 (L) 06/21/2019   LDLCALC 165 (H) 06/21/2019   TRIG 277 (H) 06/21/2019      Wt Readings from Last 3 Encounters:  06/25/19 228 lb 12 oz (103.8 kg)  06/07/19 229 lb (103.9 kg)  04/26/19 229 lb (103.9 kg)       ASSESSMENT AND PLAN:  SVT/ PSVT/ PAT - Plan: EKG 12-Lead on sotalol 40 mg 4 times a day Reports symptoms well controlled  Mixed hyperlipidemia - Plan: EKG 12-Lead Long discussion with her, myalgias on Crestor and Lipitor We have recommended she try PCSK9 inhibitor  Obesity (BMI 30-39.9) - Plan: EKG 12-Lead Recommend low carbohydrate foods Walking program as tolerated, low impact  Bilateral lower extremity edema - Plan: EKG 12-Lead Out of Lasix, has not been taking Minimal edema, Recommended Lasix 20 with potassium 10 sparingly  Leg pain We will avoid statins Recommended massage  Prolonged QT interval QTc is stable   Disposition:   F/U  12 months   Total encounter time more than 25 minutes  Greater than 50% was spent in counseling and coordination of care with the patient    Orders Placed This Encounter  Procedures  . EKG 12-Lead     Signed, Esmond Plants, M.D., Ph.D. 06/25/2019  [place], [place]

## 2019-06-21 ENCOUNTER — Other Ambulatory Visit
Admission: RE | Admit: 2019-06-21 | Discharge: 2019-06-21 | Disposition: A | Discharge status: Home / Self Care | Payer: Medicare HMO | Source: Ambulatory Visit | Attending: Cardiovascular Disease | Admitting: Cardiovascular Disease

## 2019-06-21 DIAGNOSIS — E785 Hyperlipidemia, unspecified: Secondary | ICD-10-CM | POA: Diagnosis not present

## 2019-06-21 LAB — LIPID PANEL
Cholesterol: 257 mg/dL — ABNORMAL HIGH (ref 0–200)
HDL: 37 mg/dL — ABNORMAL LOW (ref >40)
LDL Cholesterol: 165 mg/dL — ABNORMAL HIGH (ref 0–99)
Total CHOL/HDL Ratio: 6.9 RATIO
Triglycerides: 277 mg/dL — ABNORMAL HIGH (ref <150)
VLDL: 55 mg/dL — ABNORMAL HIGH (ref 0–40)

## 2019-06-22 ENCOUNTER — Telehealth: Payer: Self-pay | Admitting: Genetic Counselor

## 2019-06-22 ENCOUNTER — Other Ambulatory Visit: Payer: Self-pay | Admitting: Cardiovascular Disease

## 2019-06-22 ENCOUNTER — Other Ambulatory Visit: Payer: Self-pay

## 2019-06-22 NOTE — Telephone Encounter (Signed)
Spoke with [name] about her previous genetic testing. She has been unable to find records of having genetic testing after 2012. Because we have been unable to track down proof that she had additional genetic testing after 2012, we will proceed with genetic testing at this time to rule out a known hereditary cause for colon cancer.   We reviewed genetic testing, including the process of testing, insurance coverage, and turn-around-time for results, as well as the implications of a negative, positive and/or variant of uncertain significant result. We recommended Tracey Wiley pursue genetic testing for the Common Hereditary Cancers panel.   The Common Hereditary Cancers Panel offered by [facility] includes sequencing and/or deletion duplication testing of the following 48 genes: APC, ATM, AXIN2, BARD1, BMPR1A, BRCA1, BRCA2, BRIP1, CDH1, CDK4, CDKN2A (p14ARF), CDKN2A (p16INK4a), CHEK2, CTNNA1, DICER1, EPCAM (Deletion/duplication testing only), GREM1 (promoter region deletion/duplication testing only), KIT, MEN1, MLH1, MSH2, MSH3, MSH6, MUTYH, NBN, NF1, NHTL1, PALB2, PDGFRA, PMS2, POLD1, POLE, PTEN, RAD50, RAD51C, RAD51D, RNF43, SDHB, SDHC, SDHD, SMAD4, SMARCA4. STK11, TP53, TSC1, TSC2, and VHL.  The following genes are evaluated for sequence changes only: SDHA and HOXB13 c.251G>A variant only.   Based on Tracey Wiley family history of cancer, she meets medical criteria for genetic testing. Despite that she meets criteria, she may still have an out of pocket cost. We discussed that if her out of pocket cost for testing is over $100, the laboratory will call and confirm whether she wants to proceed with testing.  If the out of pocket cost of testing is less than $100 she will be billed by the genetic testing laboratory.   Tracey Wiley provided informed consent to pursue genetic testing and her saliva sample will be sent to [facility] for analysis of the Common Hereditary Cancers panel. Once the  laboratory receives her sample, results should be available within approximately two-three weeks' time, at which point they will be disclosed by telephone to [name], as will any additional recommendations warranted by these results. Tracey Wiley will receive a summary of her genetic counseling visit and a copy of her results once available. This information will also be available in Epic.

## 2019-06-22 NOTE — Progress Notes (Signed)
We have been unable to obtain proof that Tracey Wiley had additional genetic testing after 2012. We will, therefore, proceed with genetic testing at this time to rule out a known hereditary cause for colon cancer. Tracey Wiley provided informed consent to pursue genetic testing for the [facility] Common Hereditary Cancers panel.  The Common Hereditary Cancers Panel offered by [facility] includes sequencing and/or deletion duplication testing of the following 48 genes: APC, ATM, AXIN2, BARD1, BMPR1A, BRCA1, BRCA2, BRIP1, CDH1, CDK4, CDKN2A (p14ARF), CDKN2A (p16INK4a), CHEK2, CTNNA1, DICER1, EPCAM (Deletion/duplication testing only), GREM1 (promoter region deletion/duplication testing only), KIT, MEN1, MLH1, MSH2, MSH3, MSH6, MUTYH, NBN, NF1, NHTL1, PALB2, PDGFRA, PMS2, POLD1, POLE, PTEN, RAD50, RAD51C, RAD51D, RNF43, SDHB, SDHC, SDHD, SMAD4, SMARCA4. STK11, TP53, TSC1, TSC2, and VHL.  The following genes are evaluated for sequence changes only: SDHA and HOXB13 c.251G>A variant only.

## 2019-06-25 ENCOUNTER — Ambulatory Visit (INDEPENDENT_AMBULATORY_CARE_PROVIDER_SITE_OTHER): Payer: Medicare HMO | Admitting: Cardiovascular Disease

## 2019-06-25 ENCOUNTER — Telehealth: Payer: Self-pay | Admitting: *Deleted

## 2019-06-25 ENCOUNTER — Other Ambulatory Visit: Payer: Self-pay

## 2019-06-25 ENCOUNTER — Encounter: Payer: Self-pay | Admitting: Cardiovascular Disease

## 2019-06-25 VITALS — BP 130/70 | HR 67 | Ht 65.0 in | Wt 228.8 lb

## 2019-06-25 DIAGNOSIS — I471 Supraventricular tachycardia: Secondary | ICD-10-CM

## 2019-06-25 DIAGNOSIS — Q211 Atrial septal defect: Secondary | ICD-10-CM

## 2019-06-25 DIAGNOSIS — R600 Localized edema: Secondary | ICD-10-CM

## 2019-06-25 DIAGNOSIS — E785 Hyperlipidemia, unspecified: Secondary | ICD-10-CM

## 2019-06-25 DIAGNOSIS — Q2111 Secundum atrial septal defect: Secondary | ICD-10-CM

## 2019-06-25 MED ORDER — POTASSIUM CHLORIDE ER 10 MEQ PO TBCR: 10.0000 meq | EXTENDED_RELEASE_TABLET | Freq: Every day | ORAL | 2 refills | Status: DC | PRN

## 2019-06-25 MED ORDER — FUROSEMIDE 20 MG PO TABS: 20.0000 mg | ORAL_TABLET | Freq: Every day | ORAL | 2 refills | Status: DC | PRN

## 2019-06-25 MED ORDER — REPATHA SURECLICK 140 MG/ML ~~LOC~~ SOAJ: 140.0000 mg | SUBCUTANEOUS | 11 refills | Status: DC

## 2019-06-25 NOTE — Patient Instructions (Addendum)
Medication Instructions:  Repatha q2 weeks Intolerance to crestor, simvastatin Reason is PAD, aorta athero  Lasix and potassium as needed for swelling  If you need a refill on your cardiac medications before your next appointment, please call your pharmacy.    Lab work: Liver & Lipid panel to be done in 3 months from the start of your medication. Nothing to eat or drink after midnight prior except water with your pills. Go to [facility] Entr[facility] and check in at the front desk to have those done. No appointment needed.    If you have labs (blood work) drawn today and your tests are completely normal, you will receive your results only by: Marland Kitchen MyChart Message (if you have MyChart) OR . A paper copy in the mail If you have any lab test that is abnormal or we need to change your treatment, we will call you to review the results.   Testing/Procedures: No new testing needed   Follow-Up: At [facility], you and your health needs are our priority.  As part of our continuing mission to provide you with exceptional heart care, we have created designated Provider Care Teams.  These Care Teams include your primary Cardiologist (physician) and Advanced Practice Providers (APPs -  Physician Assistants and Nurse Practitioners) who all work together to provide you with the care you need, when you need it.  . You will need a follow up appointment in 12 months .  Marland Kitchen Providers on your designated Care Team:   . Murray Hodgkins, NP . Christell Faith, PA-C . Marrianne Mood, PA-C  Any Other Special Instructions Will Be Listed Below (If Applicable).  For educational health videos Log in to : [email or web address] Or : [email or web address], password : [email or web address]

## 2019-06-25 NOTE — Telephone Encounter (Signed)
Pt needing PA for Repatha 140 mg/ml injector PA has been submitted through Covermymeds. Awaiting Approval.

## 2019-06-26 NOTE — Telephone Encounter (Signed)
Pt has been approved for Repatha 140 mg/ml injector. 06/25/2019-12/23/2019.

## 2019-08-01 ENCOUNTER — Telehealth: Payer: Self-pay | Admitting: Genetic Counselor

## 2019-08-01 DIAGNOSIS — Z1501 Genetic susceptibility to malignant neoplasm of breast: Secondary | ICD-10-CM | POA: Insufficient documentation

## 2019-08-01 DIAGNOSIS — Z1589 Genetic susceptibility to other disease: Secondary | ICD-10-CM | POA: Insufficient documentation

## 2019-08-01 NOTE — Telephone Encounter (Signed)
Called Tracey Wiley to discuss her genetic test results. Now is not a good time for her to talk, so she will call me back later this afternoon to go over the results.

## 2019-08-01 NOTE — Telephone Encounter (Signed)
Disclosed positive genetic testing results. 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 We scheduled a virtual appointment to discuss these results in more detail on 08/10/19 at 11am.

## 2019-08-03 ENCOUNTER — Encounter: Payer: Self-pay | Admitting: Genetic Counselor

## 2019-08-03 DIAGNOSIS — Z1379 Encounter for other screening for genetic and chromosomal anomalies: Secondary | ICD-10-CM | POA: Insufficient documentation

## 2019-08-10 ENCOUNTER — Encounter: Payer: Self-pay | Admitting: Genetic Counselor

## 2019-08-10 ENCOUNTER — Inpatient Hospital Stay: Payer: Medicare HMO | Attending: Genetic Counselor | Admitting: Genetic Counselor

## 2019-08-10 DIAGNOSIS — Z1501 Genetic susceptibility to malignant neoplasm of breast: Secondary | ICD-10-CM | POA: Diagnosis not present

## 2019-08-10 DIAGNOSIS — Z1502 Genetic susceptibility to malignant neoplasm of ovary: Secondary | ICD-10-CM

## 2019-08-10 DIAGNOSIS — Z1379 Encounter for other screening for genetic and chromosomal anomalies: Secondary | ICD-10-CM | POA: Diagnosis not present

## 2019-08-10 DIAGNOSIS — Z1509 Genetic susceptibility to other malignant neoplasm: Secondary | ICD-10-CM | POA: Diagnosis not present

## 2019-08-10 DIAGNOSIS — Z1589 Genetic susceptibility to other disease: Secondary | ICD-10-CM

## 2019-08-10 NOTE — Progress Notes (Signed)
GENETIC TEST RESULTS   Patient Name: Tracey Wiley Patient Age: 64 y.o. Encounter Date: 08/10/2019  Referring Provider: Gatha Mayer, MD [address],  [place] [address]   [name] was seen in the [place] clinic on 06/12/2019 due to a family history of cancer and concern regarding a hereditary predisposition to cancer in the family. Please refer to the prior Genetics clinic note for more information regarding [name] medical and family histories and our assessment at the time.   FAMILY HISTORY:  We obtained a detailed, 4-generation family history.  Significant diagnoses are listed below: Family History  Problem Relation Age of Onset  . Hyperlipidemia Mother   . Hypertension Mother   . Kidney disease Mother   . Hypothyroidism Mother   . Cancer Mother 34       unspecified female reproductive cancer  . Heart failure Father   . Colon cancer Sister 17       no known genetic testing  . Ovarian cancer Other   . Stomach cancer Maternal Grandmother        or other GI cancer, diagnosed early 8s  . Breast cancer Paternal Aunt        dx. 57s  . Breast cancer Paternal Aunt        dx. 35s  . Diabetes Neg Hx     [name] does not have children. She has one brother, who is 55, and two sisters - one who is 83 and one who died from colon cancer that was diagnosed at age 63. No genetic testing was obtained for her sister who had colon cancer. [name] also has four nieces and nephews who are all in their 32s and have not had cancer.  [name] mother died at the age of 79 and had a history of an unspecified female reproductive cancer in her 56s, which was treated with surgery, chemotherapy, and radiation. She has one maternal aunt who died at age 25 and did not have cancer. Her maternal grandmother died in her early 100s from an unspecified GI cancer, which may have been stomach cancer. Her maternal grandfather died in his 51s from heart problems. There  are no other known diagnoses of cancer on the maternal side of the family.  [name] father died at the age of 14 and did not have a history of cancer. She had four paternal aunts and three paternal uncles. Two of her aunts had breast cancer in their 60s. She has limited information and is unsure about the health of her other paternal family members, as they all lived in [place].    [name] is unaware of previous family history of genetic testing for hereditary cancer risks. Patient's maternal ancestors are of [place] and [place] descent, and paternal ancestors are of [place]. There is no reported Ashkenazi Jewish ancestry. There is no known consanguinity.  GENETIC TESTING:  Genetic testing reported out on 07/26/2019 through the [facility] Common Hereditary Cancers panel.  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   The Common Hereditary Cancers Panel offered by [facility] includes sequencing and/or deletion duplication testing of the following 48 genes: APC, ATM, AXIN2, BARD1, BMPR1A, BRCA1, BRCA2, BRIP1, CDH1, CDK4, CDKN2A (p14ARF), CDKN2A (p16INK4a), CHEK2, CTNNA1, DICER1, EPCAM (Deletion/duplication testing only), GREM1 (promoter region deletion/duplication testing only), KIT, MEN1, MLH1, MSH2, MSH3, MSH6, MUTYH, NBN, NF1, NHTL1, PALB2, PDGFRA, PMS2, POLD1, POLE, PTEN, RAD50, RAD51C, RAD51D, RNF43, SDHB, SDHC,  SDHD, SMAD4, SMARCA4. STK11, TP53, TSC1, TSC2, and VHL.  The following genes were evaluated for sequence changes only: SDHA and HOXB13 c.251G>A variant only.     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  At this time, it is unknown if this variant is associated with increased cancer risk or if this is a normal finding, but most variants such as this get reclassified to being inconsequential. It should not be used to make medical management decisions. With  time, we suspect the lab will determine the significance of this variant, if any. If we do learn more about it, we will try to contact Ms. Toves to discuss it further. However, it is important to stay in touch with [place] periodically and keep the address and phone number up to date.  DISCUSSION OF PREVIOUS GENETIC TESTING & RECOMMENDATIONS:  [name] had previous genetic testing of the Lynch syndrome genes and the BRCA genes in 2006 and 2012 based on her family history of cancer. These results were negative, and she does not know of anyone else in her family who has had genetic testing. She has since been screened for cancer as if she has Lynch syndrome, including colonoscopies every 1-2 years, endometrial biopsies, transvaginal ultrasounds, and CA-125 screening. However, it is largely recommended to follow individuals based on their family history of cancer, not as if they have Lynch syndrome, if there is no identified germline Lynch syndrome variant in the family.  In some cases, it may be appropriate to follow a family as if they have Lynch syndrome in the absence of an identified Lynch syndrome gene mutation. These are families who meet Amsterdam II criteria and may have microsatellite instability and/or abnormal mismatch repair protein IHC identified in their tumors. Since the cancer diagnoses in [name] family history have not been verified by pathology reports (particularly her mother's and grandmother's cancers) and it is unknown if her sister's tumor had microsatellite instability or abnormal IHC, we would not recommend that she be followed as if her family has Lynch syndrome. Further recommendations regarding cancer screening given [name] most recent genetic test results are detailed below.  CHEK2 CANCER RISKS & RECOMMENDATIONS:  We discussed that CHEK2 mutations have been found to be associated with an increased risk of breast, colon, prostate, and possibly other cancers. It is  important to distinguish that [name] specific mutation (c.470T>C) has been shown to confer a risk for breast cancer that is less than the breast cancer risk for other mutations in the CHEK2 gene. Thus, her mutation is classified as a low penetrance mutation. Studies in [place] of this low penetrance mutation (c.470T>C) estimated the lifetime risk for breast cancer among carriers to be increased by about 50% over the general population risk (compared to a breast cancer risk that is approximately doubled or tripled over the general population risk for other CHEK2 mutations). The risk for contralateral breast cancer has not been well studied in women with the c.470T>C mutation.  Both men and women with a CHEK2 mutation may have an increased risk of colon cancer, and men have an increased risk for female breast cancer and prostate cancer, although precise lifetime risk estimates are not established. There is preliminary evidence supporting a correlation with CHEK2 and autosomal dominant predisposition to other cancer types; however, the available evidence is insufficient to make a determination regarding these relationships.  Cancer risks in families with the same CHEK2 mutation can vary  widely, suggesting that there are likely to be other factors involved that we don't understand yet that also contribute to the cancer risks associated with CHEK2 mutations. An individual's cancer risk and medical management are not determined by genetic test results alone. Overall cancer risk assessment incorporates additional factors, including personal medical history, family history, and any available genetic information that may result in a personalized plan for cancer prevention and surveillance.   Breast Cancer Management:  Women with a deleterious CHEK2 mutation have an increased lifetime risk for breast cancer. The following is recommended for individuals with a CHEK2 mutation, according to the NCCN Guidelines  (Genetic/Familial High-Risk Assessment:  Breast, Ovarian, and Pancreatic, Version 2.2021):  Marland Kitchen Screening: Annual mammogram with consideration of tomosynthesis and consider breast MRI with contrast starting at age 66. Marland Kitchen Evidence is insufficient for risk-reducing mastectomy. Manage based on family history.  Of note, these recommendations are based on cancer risks for frameshift pathogenic/likely pathogenic variants in the CHEK2 gene. Management should be based on best estimates of cancer risk for the specific pathogenic/likely pathogenic variant. Given both her family history of breast cancer and her reported history of dense breasts, it is reasonable for Ms. Reierson to consider additional screening with breast MRI. Ms. Worthy notes that she is highly interested in this additional screening.  Colon Cancer Management:  Men and women with a deleterious CHEK2 mutation have an increased lifetime risk for colon cancer. According to the NCCN Guidelines (Genetic/Familial High-Risk Assessment: Colorectal, Version 1.2020), the following is recommended for individuals with a CHEK2 mutation:   Personal history of colon cancer:  Follow instructions provided by your physician based on your personal history.  Do not have a personal history of colon cancer but have a parent/sibling/child with colon cancer:  Colonoscopy every 5 years starting at age 33 or 36 years younger than the earliest age of onset, whichever is younger.  Do not have a personal history of colon cancer and do not have a parent/sibling/child with colon cancer:  Colonoscopy every 5 years starting at age 18.  In Ms. Russon case, she does have a family history of colon cancer diagnosed in her sister at the age of 43. Therefore, it is recommended that Ms. Mihok have a colonoscopy every 5 years, or as determined by her GI doctor.  Prostate Cancer Management:  Although pathogenic variants in CHEK2 increase the risk for prostate cancer in men,  there are no additional screening recommendations beyond what is recommended for the general population, at this time. Men with CHEK2 variants may therefore consider the following general population prostate cancer screening recommendations:   Prostate specific antigen (PSA) blood screening may be considered in men age 31-75 after discussion of risks and benefits.  Consider digital rectal exam.  The NCCN Prostate Cancer Early Detection Guidelines (Version 2.2020) clarify that men with a family history of prostate cancer (defined as brother or father or multiple family members who were diagnosed with prostate cancer at <56 years of age or who died from prostate cancer) should begin shared decision-making about PSA screening at the age corresponding to 33 years prior to the age of the youngest family diagnosis.   FAMILY MEMBERS: It is important that all of Ms. Segall relatives (both men and women) know of the presence of this gene mutation.  Women need to know that they may be at increased risk for breast and colon cancers.  Men are at slightly increased risk for breast, prostate and colon cancers.  Genetic testing can  sort out who in the family is at risk and who is not.  We would be happy to help meet with and coordinate genetic testing for any relative that is interested.  Ms. Maggard siblings are at a 50% risk to have inherited the mutation found in her. We recommend they have genetic testing for this same mutation, as identifying the presence of this mutation would allow them to also take advantage of risk-reducing measures.   Our knowledge of cancer risks related to CHEK2 mutations will continue to evolve. We recommended that Ms. Wingler follow up with the genetics clinic annually so we can provide her with the most current information about CHEK2 and cancer risk, as well as with any changes to her family history (new cancer diagnoses, genetic test results).  Our contact number was  provided. Ms. Frerichs questions were answered to her satisfaction, and she knows she is welcome to call us at anytime with additional questions or concerns.   Clint Guy, MS, [facility] Genetic Counselor [email or web address] Phone: [phone]   The patient was seen for a total of 40 minutes in face-to-face genetic counseling.

## 2019-08-20 ENCOUNTER — Other Ambulatory Visit: Payer: Self-pay | Admitting: Cardiovascular Disease

## 2019-08-23 NOTE — Progress Notes (Signed)
Recalls changed as instructed.

## 2019-10-17 ENCOUNTER — Other Ambulatory Visit: Payer: Self-pay | Admitting: Cardiovascular Disease

## 2019-11-26 ENCOUNTER — Telehealth: Payer: Self-pay

## 2019-11-26 NOTE — Telephone Encounter (Signed)
Prior Authorization sent for: Tracey Wiley (Key: [id number]) Repatha SureClick 140MG /ML auto-injectors

## 2019-11-26 NOTE — Telephone Encounter (Signed)
additional clinical questions were answered.  Awaiting determination.

## 2019-11-28 NOTE — Telephone Encounter (Signed)
Spoke with patient and she has not started medication. 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 She states the fear of reaction has caused her to try some other holistic options due to multiple life responsibilities. So she is aware labs are needed after she has taken medication for 3 months but would like to know if her current regimen is helping or not. She is going to have labs done but this is not on Repatha and just her holistic regimen. She verbalized understanding of our conversation, agreement with plan, and had no further questions at this time. She will keep me updated on if she stays with her regimen or not once results are done.

## 2019-11-28 NOTE — Telephone Encounter (Signed)
Pharmacy calling to check on status  Informed them we are awaiting patients additional blood work

## 2019-11-28 NOTE — Telephone Encounter (Signed)
Patient needs repeat labs in order to provide authorization for insurance. Will call patient to follow up on this.

## 2019-11-28 NOTE — Telephone Encounter (Signed)
Spoke with Stanton Kidney at [facility] to inform her that the patient is not taking Repatha. She states she will inactive the medication and if the patient decides to start this medication in the future, our office will need to sen in a new prescription.

## 2019-11-29 NOTE — Telephone Encounter (Signed)
Incoming fax. PA was denied for Repatha.  Looks like patient isn't taking it.  Will place denial in Med closet.

## 2019-12-16 ENCOUNTER — Other Ambulatory Visit: Payer: Self-pay | Admitting: Cardiovascular Disease

## 2020-01-18 ENCOUNTER — Ambulatory Visit: Payer: Medicare HMO | Admitting: Family

## 2020-01-18 ENCOUNTER — Encounter: Payer: Self-pay | Admitting: Family

## 2020-01-18 ENCOUNTER — Other Ambulatory Visit: Payer: Self-pay

## 2020-01-18 VITALS — BP 130/80 | HR 61 | Ht 65.0 in | Wt 226.0 lb

## 2020-01-18 DIAGNOSIS — I471 Supraventricular tachycardia: Secondary | ICD-10-CM | POA: Diagnosis not present

## 2020-01-18 DIAGNOSIS — R002 Palpitations: Secondary | ICD-10-CM | POA: Diagnosis not present

## 2020-01-18 DIAGNOSIS — Q2111 Secundum atrial septal defect: Secondary | ICD-10-CM

## 2020-01-18 DIAGNOSIS — R0602 Shortness of breath: Secondary | ICD-10-CM | POA: Diagnosis not present

## 2020-01-18 DIAGNOSIS — Q211 Atrial septal defect: Secondary | ICD-10-CM

## 2020-01-18 DIAGNOSIS — E782 Mixed hyperlipidemia: Secondary | ICD-10-CM

## 2020-01-18 NOTE — Patient Instructions (Addendum)
Medication Instructions:  Continue your current medications.   *If you need a refill on your cardiac medications before your next appointment, please call your pharmacy*  Lab Work: Your physician recommends that you return for lab work in approximately 2 weeks at the [facility]: TSH, free T4, lipid panel, CMP, CBC, A1c - 1st desk on the right to check in (Registration) - this is just past the screening table  You do need to be fasting for these labs.  If you have labs (blood work) drawn today and your tests are completely normal, you will receive your results only by: Marland Kitchen MyChart Message (if you have MyChart) OR . A paper copy in the mail If you have any lab test that is abnormal or we need to change your treatment, we will call you to review the results.   Testing/Procedures: 1) Echocardiogram with Bubble Study: (after 02/06/20)  Your physician has requested that you have an echocardiogram (bubble study). Echocardiography is a painless test that uses sound waves to create images of your heart. It provides your doctor with information about the size and shape of your heart and how well your heart's chambers and valves are working. This procedure takes approximately one hour. There are no restrictions for this procedure.  2) Please wear the ZIO monitor for 2 weeks:  Your physician has recommended that you wear a 14 day Zio (heart) monitor- to be mailed to you to be placed on Wednesday 01/23/20. This monitor is a medical device that records the heart's electrical activity. Doctors most often use these monitors to diagnose arrhythmias. Arrhythmias are problems with the speed or rhythm of the heartbeat. The monitor is a small device applied to your chest. You can wear one while you do your normal daily activities. While wearing this monitor if you have any symptoms to push the button and record what you felt. Once you have worn this monitor for the period of time provider prescribed (Usually 14  days), you will return the monitor device in the postage paid box. Once it is returned they will download the data collected and provide [place] with a report which the provider will then review and we will call you with those results. Important tips:  1. Avoid showering during the first 24 hours of wearing the monitor. 2. Avoid excessive sweating to help maximize wear time. 3. Do not submerge the device, no hot tubs, and no swimming pools. 4. Keep any lotions or oils away from the patch. 5. After 24 hours you may shower with the patch on. Take brief showers with your back facing the shower head.  6. Do not remove patch once it has been placed because that will interrupt data and decrease adhesive wear time. 7. Push the button when you have any symptoms and write down what you were feeling. 8. Once you have completed wearing your monitor, remove and place into box which has postage paid and place in your outgoing mailbox.  9. If for some reason you have misplaced your box then call our office and we can provide another box and/or mail it off for you.        Follow-Up: At [facility], you and your health needs are our priority.  As part of our continuing mission to provide you with exceptional heart care, we have created designated Provider Care Teams.  These Care Teams include your primary Cardiologist (physician) and Advanced Practice Providers (APPs -  Physician Assistants and Nurse Practitioners) who all work  together to provide you with the care you need, when you need it.  We recommend signing up for the patient portal called "MyChart".  Sign up information is provided on this After Visit Summary.  MyChart is used to connect with patients for Virtual Visits (Telemedicine).  Patients are able to view lab/test results, encounter notes, upcoming appointments, etc.  Non-urgent messages can be sent to your provider as well.   To learn more about what you can do with MyChart, go to  [email or web address].    Your next appointment:   6 week(s)  The format for your next appointment:   In Person  Provider:   You may see Ida Rogue, MD or one of the following Advanced Practice Providers on your designated Care Team:    Murray Hodgkins, NP  Christell Faith, PA-C  Laurann Montana, NP  Marrianne Mood, PA-C  Other Instructions  The lab work will look for any abnormalities that could be causing your palpitations or shortness of breath. They will check your electrolytes, liver function, thyroid, blood counts, and cholesterol.  The bubble study will look at your PFO closure device as well as your heart valves to make sure no valvular abnormalities are causing your palpitations or shortness of breath.  Ezetimibe Tablets What is this medicine? EZETIMIBE (ez ET i mibe) blocks the absorption of cholesterol from the stomach. It can help lower blood cholesterol for patients who are at risk of getting heart disease or a stroke. It is only for patients whose cholesterol level is not controlled by diet. This medicine may be used for other purposes; ask your health care provider or pharmacist if you have questions. COMMON BRAND NAME(S): Zetia What should I tell my health care provider before I take this medicine? They need to know if you have any of these conditions:  liver disease  an unusual or allergic reaction to ezetimibe, medicines, foods, dyes, or preservatives  pregnant or trying to get pregnant  breast-feeding How should I use this medicine? Take this medicine by mouth with a glass of water. Follow the directions on the prescription label. This medicine can be taken with or without food. Take your doses at regular intervals. Do not take your medicine more often than directed. Talk to your pediatrician regarding the use of this medicine in children. Special care may be needed. Overdosage: If you think you have taken too much of this medicine contact a poison  control center or emergency room at once. NOTE: This medicine is only for you. Do not share this medicine with others. What if I miss a dose? If you miss a dose, take it as soon as you can. If it is almost time for your next dose, take only that dose. Do not take double or extra doses. What may interact with this medicine? Do not take this medicine with any of the following medications:  fenofibrate  gemfibrozil This medicine may also interact with the following medications:  antacids  cyclosporine  herbal medicines like red yeast rice  other medicines to lower cholesterol or triglycerides This list may not describe all possible interactions. Give your health care provider a list of all the medicines, herbs, non-prescription drugs, or dietary supplements you use. Also tell them if you smoke, drink alcohol, or use illegal drugs. Some items may interact with your medicine. What should I watch for while using this medicine? Visit your doctor or health care professional for regular checks on your progress. You will need to have your  cholesterol levels checked. If you are also taking some other cholesterol medicines, you will also need to have tests to make sure your liver is working properly. Tell your doctor or health care professional if you get any unexplained muscle pain, tenderness, or weakness, especially if you also have a fever and tiredness. You need to follow a low-cholesterol, low-fat diet while you are taking this medicine. This will decrease your risk of getting heart and blood vessel disease. Exercising and avoiding alcohol and smoking can also help. Ask your doctor or dietician for advice. What side effects may I notice from receiving this medicine? Side effects that you should report to your doctor or health care professional as soon as possible:  allergic reactions like skin rash, itching or hives, swelling of the face, lips, or tongue  dark yellow or brown  urine  unusually weak or tired  yellowing of the skin or eyes Side effects that usually do not require medical attention (report to your doctor or health care professional if they continue or are bothersome):  diarrhea  dizziness  headache  stomach upset or pain This list may not describe all possible side effects. Call your doctor for medical advice about side effects. You may report side effects to FDA at 1-800-FDA-1088. Where should I keep my medicine? Keep out of the reach of children. Store at room temperature between 15 and 30 degrees C (59 and 86 degrees F). Protect from moisture. Keep container tightly closed. Throw away any unused medicine after the expiration date. NOTE: This sheet is a summary. It may not cover all possible information. If you have questions about this medicine, talk to your doctor, pharmacist, or health care provider.  2020 Elsevier/Gold Standard (2011-11-29 15:39:09)  Bempedoic acid; Ezetimibe Tablets What is this medicine? BEMPEDOIC ACID; EZETIMIBE (BEM pe DOE ik AS id; ez ET i mibe) is used to lower the level of cholesterol in the blood. It is used with other cholesterol-lowering drugs. This medicine may be used for other purposes; ask your health care provider or pharmacist if you have questions. COMMON BRAND NAME(S): NEXLIZET What should I tell my health care provider before I take this medicine? They need to know if you have any of these conditions:  gout  kidney problems  liver problems  tendon problems  an unusual or allergic reaction to bempedoic acid, ezetimibe, other medicines, foods, dyes, or preservatives  pregnant or trying to become pregnant  breast-feeding How should I use this medicine? Take this medicine by mouth with a glass of water. Follow the directions on the prescription label. Do not cut, crush, or chew this medicine. Swallow the tablets whole. You can take it with or without food. If it upsets your stomach, take it  with food. Take your doses at regular intervals. Do not take your medicine more often than directed. Talk to your pediatrician about the use of this medicine in children. Special care may be needed. Overdosage: If you think you have taken too much of this medicine contact a poison control center or emergency room at once. NOTE: This medicine is only for you. Do not share this medicine with others. What if I miss a dose? If you miss a dose, take it as soon as you can. If it is almost time for your next dose, take only that dose. Do not take double or extra doses. What may interact with this medicine? Do not take this medicine with any of the following medications:  fenofibrate  gemfibrozil  This medicine may also interact with the following medications:  antacids  cyclosporine  pravastatin  simvastatin  other medicines to lower cholesterol or triglycerides This list may not describe all possible interactions. Give your health care provider a list of all the medicines, herbs, non-prescription drugs, or dietary supplements you use. Also tell them if you smoke, drink alcohol, or use illegal drugs. Some items may interact with your medicine. What should I watch for while using this medicine? Visit your health care professional for regular checks on your progress. Tell your health care professional if your symptoms do not start to get better or if they get worse. You may need blood work done while you are taking this medicine. This drug is only part of a total heart-health program. Your doctor or a dietician can suggest a low-cholesterol and low-fat diet to help. Avoid alcohol and smoking, and keep a proper exercise schedule. What side effects may I notice from receiving this medicine? Side effects that you should report to your doctor or health care professional as soon as possible:  allergic reactions like skin rash, itching or hives, swelling of the face, lips, or tongue  signs of gout  such as swollen, red, warm, or tender joints, especially in the toes  signs of tendon problems such as tendon pain or swelling or if you are unable to move a joint Side effects that usually do not require medical attention (report these to your doctor or health care professional if they continue or are bothersome):  back pain  cold or flu-like symptoms  headache  muscle spasms  stomach upset or pain This list may not describe all possible side effects. Call your doctor for medical advice about side effects. You may report side effects to FDA at 1-800-FDA-1088. Where should I keep my medicine? Keep out of the reach of children. Store at room temperature between 15 and 30 degrees C (59 and 86 degrees F). Keep this medicine in the original container. Do not throw out the packet in the container. It keeps the medicine dry. Throw away any unused medication after the expiration date. NOTE: This sheet is a summary. It may not cover all possible information. If you have questions about this medicine, talk to your doctor, pharmacist, or health care provider.  2020 Elsevier/Gold Standard (2018-08-09 13:09:47)   Evolocumab injection What is this medicine? EVOLOCUMAB (e voe LOK ue mab) is known as a PCSK9 inhibitor. It is used to lower the level of cholesterol in the blood. It may be used alone or in combination with other cholesterol-lowering drugs. This drug may also be used to reduce the risk of heart attack, stroke, and certain types of heart surgery in patients with heart disease. This medicine may be used for other purposes; ask your health care provider or pharmacist if you have questions. COMMON BRAND NAME(S): Repatha What should I tell my health care provider before I take this medicine? They need to know if you have any of these conditions:  an unusual or allergic reaction to evolocumab, other medicines, latex, foods, dyes, or preservatives  pregnant or trying to get  pregnant  breast-feeding How should I use this medicine? This medicine is for injection under the skin. You will be taught how to prepare and give this medicine. Use exactly as directed. Take your medicine at regular intervals. Do not take your medicine more often than directed. It is important that you put your used needles and syringes in a special sharps container. Do  not put them in a trash can. If you do not have a sharps container, call your pharmacist or health care provider to get one. Talk to your pediatrician regarding the use of this medicine in children. While this drug may be prescribed for children as young as 13 years for selected conditions, precautions do apply. Overdosage: If you think you have taken too much of this medicine contact a poison control center or emergency room at once. NOTE: This medicine is only for you. Do not share this medicine with others. What if I miss a dose? If you miss a dose, take it as soon as you can if there are more than 7 days until the next scheduled dose, or skip the missed dose and take the next dose according to your original schedule. Do not take double or extra doses. What may interact with this medicine? Interactions are not expected. This list may not describe all possible interactions. Give your health care provider a list of all the medicines, herbs, non-prescription drugs, or dietary supplements you use. Also tell them if you smoke, drink alcohol, or use illegal drugs. Some items may interact with your medicine. What should I watch for while using this medicine? Visit your health care provider for regular checks on your progress. Tell your health care provider if your symptoms do not start to get better or if they get worse. You may need blood work done while you are taking this drug. Do not wear the on-body infuser during an MRI. What side effects may I notice from receiving this medicine? Side effects that you should report to your  doctor or health care professional as soon as possible:  allergic reactions like skin rash, itching or hives, swelling of the face, lips, or tongue  signs and symptoms of high blood sugar such as dizziness; dry mouth; dry skin; fruity breath; nausea; stomach pain; increased hunger or thirst; increased urination  signs and symptoms of infection like fever or chills; cough; sore throat; pain or trouble passing urine Side effects that usually do not require medical attention (report to your doctor or health care professional if they continue or are bothersome):  diarrhea  nausea  muscle pain  pain, redness, or irritation at site where injected This list may not describe all possible side effects. Call your doctor for medical advice about side effects. You may report side effects to FDA at 1-800-FDA-1088. Where should I keep my medicine? Keep out of the reach of children. You will be instructed on how to store this medicine. Throw away any unused medicine after the expiration date on the label. NOTE: This sheet is a summary. It may not cover all possible information. If you have questions about this medicine, talk to your doctor, pharmacist, or health care provider.  2020 Elsevier/Gold Standard (2019-03-27 16:22:29)
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
## 2020-01-26 ENCOUNTER — Ambulatory Visit (INDEPENDENT_AMBULATORY_CARE_PROVIDER_SITE_OTHER): Payer: Medicare HMO

## 2020-01-26 DIAGNOSIS — R002 Palpitations: Secondary | ICD-10-CM

## 2020-02-09 ENCOUNTER — Other Ambulatory Visit: Payer: Self-pay | Admitting: Physician Assistant

## 2020-02-15 ENCOUNTER — Other Ambulatory Visit: Payer: Medicare HMO

## 2020-02-21 ENCOUNTER — Other Ambulatory Visit: Payer: Self-pay

## 2020-02-21 ENCOUNTER — Encounter: Payer: Self-pay | Admitting: Nurse Practitioner

## 2020-02-21 ENCOUNTER — Telehealth (INDEPENDENT_AMBULATORY_CARE_PROVIDER_SITE_OTHER): Payer: Medicare HMO | Admitting: Nurse Practitioner

## 2020-02-21 VITALS — BP 134/86 | HR 67 | Temp 98.1°F | Ht 65.0 in | Wt 220.0 lb

## 2020-02-21 DIAGNOSIS — J22 Unspecified acute lower respiratory infection: Secondary | ICD-10-CM

## 2020-02-21 MED ORDER — PREDNISONE 10 MG PO TABS: ORAL_TABLET | ORAL | 0 refills | Status: DC

## 2020-02-21 MED ORDER — ALBUTEROL SULFATE HFA 108 (90 BASE) MCG/ACT IN AERS: 2.0000 | INHALATION_SPRAY | Freq: Four times a day (QID) | RESPIRATORY_TRACT | 1 refills | Status: DC | PRN

## 2020-02-21 MED ORDER — BENZONATATE 200 MG PO CAPS: 200.0000 mg | ORAL_CAPSULE | Freq: Three times a day (TID) | ORAL | 0 refills | Status: AC | PRN

## 2020-02-21 MED ORDER — AMOXICILLIN-POT CLAVULANATE 875-125 MG PO TABS: 1.0000 | ORAL_TABLET | Freq: Two times a day (BID) | ORAL | 0 refills | Status: DC
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
## 2020-02-22 ENCOUNTER — Ambulatory Visit
Admission: RE | Admit: 2020-02-22 | Discharge: 2020-02-22 | Disposition: A | Discharge status: Home / Self Care | Payer: Medicare HMO | Source: Ambulatory Visit | Attending: Nurse Practitioner | Admitting: Nurse Practitioner

## 2020-02-22 ENCOUNTER — Ambulatory Visit
Admission: RE | Admit: 2020-02-22 | Discharge: 2020-02-22 | Disposition: A | Discharge status: Home / Self Care | Payer: Medicare HMO | Attending: *Deleted | Admitting: *Deleted

## 2020-02-22 ENCOUNTER — Other Ambulatory Visit: Payer: Self-pay

## 2020-02-22 DIAGNOSIS — J22 Unspecified acute lower respiratory infection: Secondary | ICD-10-CM | POA: Diagnosis present

## 2020-02-23 ENCOUNTER — Encounter: Payer: Self-pay | Admitting: Nurse Practitioner

## 2020-02-23 NOTE — Patient Instructions (Addendum)
Please get CXR at [facility].  I have ordered Augmentin for antibiotic take 1 pill twice daily wit food.  Take a probiotic with it to help prevent diarrhea and eat yogurt with live yeast cultures if you like it.   Tessalon Perles for cough as needed x 3 per day  Albuterol inhaler every 6 hours as needed for wheezing- may cause tremors, raise heart rate  Prednisone 6 day taper   Rest, hydrate well, continue with your Mucinex as directed.   Seek in person care if no improvement or symptoms worsen.     Viral Respiratory Infection A respiratory infection is an illness that affects part of the respiratory system, such as the lungs, nose, or throat. A respiratory infection that is caused by a virus is called a viral respiratory infection. Common types of viral respiratory infections include:  A cold.  The flu (influenza).  A respiratory syncytial virus (RSV) infection. What are the causes? This condition is caused by a virus. What are the signs or symptoms? Symptoms of this condition include:  A stuffy or runny nose.  Yellow or green nasal discharge.  A cough.  Sneezing.  Fatigue.  Achy muscles.  A sore throat.  Sweating or chills.  A fever.  A headache. How is this diagnosed? This condition may be diagnosed based on:  Your symptoms.  A physical exam.  Testing of nasal swabs. How is this treated? This condition may be treated with medicines, such as:  Antiviral medicine. This may shorten the length of time a person has symptoms.  Expectorants. These make it easier to cough up mucus.  Decongestant nasal sprays.  Acetaminophen or NSAIDs to relieve fever and pain. Antibiotic medicines are not prescribed for viral infections. This is because antibiotics are designed to kill bacteria. They are not effective against viruses. Follow these instructions at home:  Managing pain and congestion  Take over-the-counter and prescription medicines only as told by your  health care provider.  If you have a sore throat, gargle with a salt-water mixture 3-4 times a day or as needed. To make a salt-water mixture, completely dissolve -1 tsp of salt in 1 cup of warm water.  Use nose drops made from salt water to ease congestion and soften raw skin around your nose.  Drink enough fluid to keep your urine pale yellow. This helps prevent dehydration and helps loosen up mucus. General instructions  Rest as much as possible.  Do not drink alcohol.  Do not use any products that contain nicotine or tobacco, such as cigarettes and e-cigarettes. If you need help quitting, ask your health care provider.  Keep all follow-up visits as told by your health care provider. This is important. How is this prevented?   Get an annual flu shot. You may get the flu shot in late summer, fall, or winter. Ask your health care provider when you should get your flu shot.  Avoid exposing others to your respiratory infection. ? Stay home from work or school as told by your health care provider. ? Wash your hands with soap and water often, especially after you cough or sneeze. If soap and water are not available, use alcohol-based hand sanitizer.  Avoid contact with people who are sick during cold and flu season. This is generally fall and winter. Contact a health care provider if:  Your symptoms last for 10 days or longer.  Your symptoms get worse over time.  You have a fever.  You have severe sinus pain  in your face or forehead.  The glands in your jaw or neck become very swollen. Get help right away if you:  Feel pain or pressure in your chest.  Have shortness of breath.  Faint or feel like you will faint.  Have severe and persistent vomiting.  Feel confused or disoriented. Summary  A respiratory infection is an illness that affects part of the respiratory system, such as the lungs, nose, or throat. A respiratory infection that is caused by a virus is called a  viral respiratory infection.  Common types of viral respiratory infections are a cold, influenza, and respiratory syncytial virus (RSV) infection.  Symptoms of this condition include a stuffy or runny nose, cough, sneezing, fatigue, achy muscles, sore throat, and fevers or chills.  Antibiotic medicines are not prescribed for viral infections. This is because antibiotics are designed to kill bacteria. They are not effective against viruses. This information is not intended to replace advice given to you by your health care provider. Make sure you discuss any questions you have with your health care provider. Document Revised: 06/01/2018 Document Reviewed: 07/04/2017 Elsevier Patient Education  2020 [employer].

## 2020-02-25 ENCOUNTER — Telehealth: Payer: Self-pay

## 2020-02-25 NOTE — Telephone Encounter (Signed)
LMTCB

## 2020-02-25 NOTE — Telephone Encounter (Signed)
-----   Message from Marval Regal, NP sent at 02/24/2020  1:37 PM EDT ----- Please cal her with no acute finding in his CXR. Please get a symptom update on Mon.

## 2020-02-25 NOTE — Telephone Encounter (Signed)
Patient aware of results.

## 2020-02-27 ENCOUNTER — Ambulatory Visit: Payer: Medicare HMO

## 2020-02-29 ENCOUNTER — Ambulatory Visit: Payer: Medicare HMO | Admitting: Family

## 2020-03-04 ENCOUNTER — Ambulatory Visit (INDEPENDENT_AMBULATORY_CARE_PROVIDER_SITE_OTHER): Payer: Medicare HMO

## 2020-03-04 VITALS — Ht 65.0 in | Wt 220.0 lb

## 2020-03-04 DIAGNOSIS — Z0000 Encounter for general adult medical examination without abnormal findings: Secondary | ICD-10-CM
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
## 2020-03-04 NOTE — Patient Instructions (Addendum)
Tracey Wiley , Thank you for taking time to come for your Medicare Wellness Visit. I appreciate your ongoing commitment to your health goals. Please review the following plan we discussed and let me know if I can assist you in the future.   These are the goals we discussed: Goals      Patient Stated   .  Increase physical activity (pt-stated)      I want to increase my steps to 10,000 daily       This is a list of the screening recommended for you and due dates:  Health Maintenance  Topic Date Due  .  Hepatitis C: One time screening is recommended by Center for Disease Control  (CDC) for  adults born from [birth year] through 1963.   Never done  . HIV Screening  Never done  . Tetanus Vaccine  09/05/2017  . Flu Shot  01/06/2020  . Colon Cancer Screening  04/25/2020  . Mammogram  12/27/2020  . Pap Smear  01/24/2022  . COVID-19 Vaccine  Completed    Immunizations Immunization History  Administered Date(s) Administered  . Hepatitis B 05/12/2011  . Influenza Split 05/12/2011  . Influenza,inj,Quad PF,6+ Mos 03/13/2013, 03/14/2014, 04/08/2016, 03/21/2017, 04/03/2018, 03/27/2019  . Moderna SARS-COVID-2 Vaccination 08/25/2019, 09/22/2019  . Tdap 09/06/2007   Advanced directives: End of life planning; Advance aging; Advanced directives discussed.  Copy of current HCPOA/Living Will requested.    Conditions/risks identified: none new.   Follow up in one year for your annual wellness visit.   Preventive Care 70 Years and Older, Female Preventive care refers to lifestyle choices and visits with your health care provider that can promote health and wellness. What does preventive care include?  A yearly physical exam. This is also called an annual well check.  Dental exams once or twice a year.  Routine eye exams. Ask your health care provider how often you should have your eyes checked.  Personal lifestyle choices, including:  Daily care of your teeth and gums.  Regular physical  activity.  Eating a healthy diet.  Avoiding tobacco and drug use.  Limiting alcohol use.  Practicing safe sex.  Taking low-dose aspirin every day.  Taking vitamin and mineral supplements as recommended by your health care provider. What happens during an annual well check? The services and screenings done by your health care provider during your annual well check will depend on your age, overall health, lifestyle risk factors, and family history of disease. Counseling  Your health care provider may ask you questions about your:  Alcohol use.  Tobacco use.  Drug use.  Emotional well-being.  Home and relationship well-being.  Sexual activity.  Eating habits.  History of falls.  Memory and ability to understand (cognition).  Work and work Statistician.  Reproductive health. Screening  You may have the following tests or measurements:  Height, weight, and BMI.  Blood pressure.  Lipid and cholesterol levels. These may be checked every 5 years, or more frequently if you are over 55 years old.  Skin check.  Lung cancer screening. You may have this screening every year starting at age 74 if you have a 30-pack-year history of smoking and currently smoke or have quit within the past 15 years.  Fecal occult blood test (FOBT) of the stool. You may have this test every year starting at age 81.  Flexible sigmoidoscopy or colonoscopy. You may have a sigmoidoscopy every 5 years or a colonoscopy every 10 years starting at age 80.  Hepatitis  C blood test.  Hepatitis B blood test.  Sexually transmitted disease (STD) testing.  Diabetes screening. This is done by checking your blood sugar (glucose) after you have not eaten for a while (fasting). You may have this done every 1-3 years.  Bone density scan. This is done to screen for osteoporosis. You may have this done starting at age 15.  Mammogram. This may be done every 1-2 years. Talk to your health care provider about  how often you should have regular mammograms. Talk with your health care provider about your test results, treatment options, and if necessary, the need for more tests. Vaccines  Your health care provider may recommend certain vaccines, such as:  Influenza vaccine. This is recommended every year.  Tetanus, diphtheria, and acellular pertussis (Tdap, Td) vaccine. You may need a Td booster every 10 years.  Zoster vaccine. You may need this after age 56.  Pneumococcal 13-valent conjugate (PCV13) vaccine. One dose is recommended after age 43.  Pneumococcal polysaccharide (PPSV23) vaccine. One dose is recommended after age 87. Talk to your health care provider about which screenings and vaccines you need and how often you need them. This information is not intended to replace advice given to you by your health care provider. Make sure you discuss any questions you have with your health care provider. Document Released: 06/20/2015 Document Revised: 02/11/2016 Document Reviewed: 03/25/2015 Elsevier Interactive Patient Education  2017 [place] Prevention in the Home Falls can cause injuries. They can happen to people of all ages. There are many things you can do to make your home safe and to help prevent falls. What can I do on the outside of my home?  Regularly fix the edges of walkways and driveways and fix any cracks.  Remove anything that might make you trip as you walk through a door, such as a raised step or threshold.  Trim any bushes or trees on the path to your home.  Use bright outdoor lighting.  Clear any walking paths of anything that might make someone trip, such as rocks or tools.  Regularly check to see if handrails are loose or broken. Make sure that both sides of any steps have handrails.  Any raised decks and porches should have guardrails on the edges.  Have any leaves, snow, or ice cleared regularly.  Use sand or salt on walking paths during  winter.  Clean up any spills in your garage right away. This includes oil or grease spills. What can I do in the bathroom?  Use night lights.  Install grab bars by the toilet and in the tub and shower. Do not use towel bars as grab bars.  Use non-skid mats or decals in the tub or shower.  If you need to sit down in the shower, use a plastic, non-slip stool.  Keep the floor dry. Clean up any water that spills on the floor as soon as it happens.  Remove soap buildup in the tub or shower regularly.  Attach bath mats securely with double-sided non-slip rug tape.  Do not have throw rugs and other things on the floor that can make you trip. What can I do in the bedroom?  Use night lights.  Make sure that you have a light by your bed that is easy to reach.  Do not use any sheets or blankets that are too big for your bed. They should not hang down onto the floor.  Have a firm chair that has side arms.  You can use this for support while you get dressed.  Do not have throw rugs and other things on the floor that can make you trip. What can I do in the kitchen?  Clean up any spills right away.  Avoid walking on wet floors.  Keep items that you use a lot in easy-to-reach places.  If you need to reach something above you, use a strong step stool that has a grab bar.  Keep electrical cords out of the way.  Do not use floor polish or wax that makes floors slippery. If you must use wax, use non-skid floor wax.  Do not have throw rugs and other things on the floor that can make you trip. What can I do with my stairs?  Do not leave any items on the stairs.  Make sure that there are handrails on both sides of the stairs and use them. Fix handrails that are broken or loose. Make sure that handrails are as long as the stairways.  Check any carpeting to make sure that it is firmly attached to the stairs. Fix any carpet that is loose or worn.  Avoid having throw rugs at the top or  bottom of the stairs. If you do have throw rugs, attach them to the floor with carpet tape.  Make sure that you have a light switch at the top of the stairs and the bottom of the stairs. If you do not have them, ask someone to add them for you. What else can I do to help prevent falls?  Wear shoes that:  Do not have high heels.  Have rubber bottoms.  Are comfortable and fit you well.  Are closed at the toe. Do not wear sandals.  If you use a stepladder:  Make sure that it is fully opened. Do not climb a closed stepladder.  Make sure that both sides of the stepladder are locked into place.  Ask someone to hold it for you, if possible.  Clearly mark and make sure that you can see:  Any grab bars or handrails.  First and last steps.  Where the edge of each step is.  Use tools that help you move around (mobility aids) if they are needed. These include:  Canes.  Walkers.  Scooters.  Crutches.  Turn on the lights when you go into a dark area. Replace any light bulbs as soon as they burn out.  Set up your furniture so you have a clear path. Avoid moving your furniture around.  If any of your floors are uneven, fix them.  If there are any pets around you, be aware of where they are.  Review your medicines with your doctor. Some medicines can make you feel dizzy. This can increase your chance of falling. Ask your doctor what other things that you can do to help prevent falls. This information is not intended to replace advice given to you by your health care provider. Make sure you discuss any questions you have with your health care provider. Document Released: 03/20/2009 Document Revised: 10/30/2015 Document Reviewed: 06/28/2014 Elsevier Interactive Patient Education  2017 [employer].

## 2020-03-13 ENCOUNTER — Ambulatory Visit (INDEPENDENT_AMBULATORY_CARE_PROVIDER_SITE_OTHER): Payer: Medicare HMO

## 2020-03-13 ENCOUNTER — Other Ambulatory Visit: Payer: Self-pay

## 2020-03-13 DIAGNOSIS — Q211 Atrial septal defect: Secondary | ICD-10-CM | POA: Diagnosis not present

## 2020-03-13 DIAGNOSIS — R0602 Shortness of breath: Secondary | ICD-10-CM | POA: Diagnosis not present

## 2020-03-13 DIAGNOSIS — Q2111 Secundum atrial septal defect: Secondary | ICD-10-CM

## 2020-03-14 LAB — ECHOCARDIOGRAM COMPLETE BUBBLE STUDY
AR max vel: 2.61 cm2
AV Area VTI: 2.85 cm2
AV Area mean vel: 2.7 cm2
AV Mean grad: 3 mmHg
AV Peak grad: 6.3 mmHg
Ao pk vel: 1.25 m/s
Area-P 1/2: 3.6 cm2
Calc EF: 56.7 %
S' Lateral: 3.2 cm
Single Plane A2C EF: 51.5 %
Single Plane A4C EF: 56.1 %

## 2020-03-17 ENCOUNTER — Ambulatory Visit: Payer: Medicare HMO | Admitting: Family

## 2020-03-20 ENCOUNTER — Other Ambulatory Visit
Admission: RE | Admit: 2020-03-20 | Discharge: 2020-03-20 | Disposition: A | Discharge status: Home / Self Care | Payer: Medicare HMO | Attending: Family | Admitting: Family

## 2020-03-20 ENCOUNTER — Other Ambulatory Visit: Payer: Self-pay

## 2020-03-20 DIAGNOSIS — E782 Mixed hyperlipidemia: Secondary | ICD-10-CM | POA: Insufficient documentation

## 2020-03-20 DIAGNOSIS — R0602 Shortness of breath: Secondary | ICD-10-CM | POA: Diagnosis present

## 2020-03-20 LAB — COMPREHENSIVE METABOLIC PANEL
ALT: 19 U/L (ref 0–44)
AST: 23 U/L (ref 15–41)
Albumin: 3.7 g/dL (ref 3.5–5.0)
Alkaline Phosphatase: 57 U/L (ref 38–126)
Anion gap: 10 (ref 5–15)
BUN: 18 mg/dL (ref 8–23)
CO2: 29 mmol/L (ref 22–32)
Calcium: 9.2 mg/dL (ref 8.9–10.3)
Chloride: 100 mmol/L (ref 98–111)
Creatinine, Ser: 0.89 mg/dL (ref 0.44–1.00)
GFR, Estimated: >60 mL/min (ref >60)
Glucose, Bld: 114 mg/dL — ABNORMAL HIGH (ref 70–99)
Potassium: 4.5 mmol/L (ref 3.5–5.1)
Sodium: 139 mmol/L (ref 135–145)
Total Bilirubin: 0.9 mg/dL (ref 0.3–1.2)
Total Protein: 7.5 g/dL (ref 6.5–8.1)

## 2020-03-20 LAB — LIPID PANEL
Cholesterol: 268 mg/dL — ABNORMAL HIGH (ref 0–200)
HDL: 39 mg/dL — ABNORMAL LOW (ref >40)
LDL Cholesterol: 189 mg/dL — ABNORMAL HIGH (ref 0–99)
Total CHOL/HDL Ratio: 6.9 RATIO
Triglycerides: 202 mg/dL — ABNORMAL HIGH (ref <150)
VLDL: 40 mg/dL (ref 0–40)

## 2020-03-20 LAB — T4, FREE: Free T4: 1.05 ng/dL (ref 0.61–1.12)

## 2020-03-20 LAB — TSH: TSH: 2.745 u[IU]/mL (ref 0.350–4.500)

## 2020-03-21 LAB — HEMOGLOBIN A1C
Hgb A1c MFr Bld: 6.2 % — ABNORMAL HIGH (ref 4.8–5.6)
Mean Plasma Glucose: 131 mg/dL

## 2020-03-24 ENCOUNTER — Other Ambulatory Visit: Payer: Self-pay

## 2020-03-24 ENCOUNTER — Encounter: Payer: Self-pay | Admitting: Family

## 2020-03-24 ENCOUNTER — Ambulatory Visit: Payer: Medicare HMO | Admitting: Family

## 2020-03-24 VITALS — BP 112/80 | HR 72 | Ht 65.0 in | Wt 227.0 lb

## 2020-03-24 DIAGNOSIS — I471 Supraventricular tachycardia, unspecified: Secondary | ICD-10-CM

## 2020-03-24 DIAGNOSIS — R0602 Shortness of breath: Secondary | ICD-10-CM

## 2020-03-24 DIAGNOSIS — Z79899 Other long term (current) drug therapy: Secondary | ICD-10-CM

## 2020-03-24 DIAGNOSIS — Q211 Atrial septal defect: Secondary | ICD-10-CM

## 2020-03-24 DIAGNOSIS — M791 Myalgia, unspecified site: Secondary | ICD-10-CM

## 2020-03-24 DIAGNOSIS — I5189 Other ill-defined heart diseases: Secondary | ICD-10-CM

## 2020-03-24 DIAGNOSIS — E782 Mixed hyperlipidemia: Secondary | ICD-10-CM

## 2020-03-24 DIAGNOSIS — Q2111 Secundum atrial septal defect: Secondary | ICD-10-CM

## 2020-03-24 DIAGNOSIS — R002 Palpitations: Secondary | ICD-10-CM

## 2020-03-24 DIAGNOSIS — E785 Hyperlipidemia, unspecified: Secondary | ICD-10-CM

## 2020-03-24 MED ORDER — POTASSIUM CHLORIDE ER 10 MEQ PO TBCR: 10.0000 meq | EXTENDED_RELEASE_TABLET | ORAL | 1 refills | Status: DC

## 2020-03-24 MED ORDER — FUROSEMIDE 20 MG PO TABS: 20.0000 mg | ORAL_TABLET | Freq: Every day | ORAL | 1 refills | Status: DC

## 2020-03-24 MED ORDER — EZETIMIBE 10 MG PO TABS: 10.0000 mg | ORAL_TABLET | Freq: Every day | ORAL | 1 refills | Status: DC

## 2020-03-24 NOTE — Patient Instructions (Addendum)
Medication Instructions:  Your physician has recommended you make the following change in your medication:   CHANGE Furosemide (LAsix) to 20mg  daily  CHANGE Potassium to 80mEq three times per week  START Zetia (ezetimibe) 10 mg one tablet daily  *If you need a refill on your cardiac medications before your next appointment, please call your pharmacy*  Lab Work: Your physician recommends that you return for lab work in 2 weeks:  BMP/ Magnesium (This will let us check your kidney function and electrolytes after starting Lasix).  - come to the  [facility].  - 1st desk on the right, past the screening table to check in - no appointment needed - Lab hours: Monday- Friday (7:30 am- 5:30 pm)   If you have labs (blood work) drawn today and your tests are completely normal, you will receive your results only by: Marland Kitchen MyChart Message (if you have MyChart) OR . A paper copy in the mail If you have any lab test that is abnormal or we need to change your treatment, we will call you to review the results.  Testing/Procedures: Your echocardiogram shows normal heart pumping function. Your heart is moderately stiff. 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 This is a great result!  Your ZIO monitor showed mostly normal sinus rhythm. You had a few episodes of a fast but regular heart rhythm - most of the time when these were occurring you did not press the button.   Follow-Up: At [facility], you and your health needs are our priority.  As part of our continuing mission to provide you with exceptional heart care, we have created designated Provider Care Teams.  These Care Teams include your primary Cardiologist (physician) and Advanced Practice Providers (APPs -  Physician Assistants and Nurse Practitioners) who all work together to provide you with the care you  need, when you need it.  We recommend signing up for the patient portal called "MyChart".  Sign up information is provided on this After Visit Summary.  MyChart is used to connect with patients for Virtual Visits (Telemedicine).  Patients are able to view lab/test results, encounter notes, upcoming appointments, etc.  Non-urgent messages can be sent to your provider as well.   To learn more about what you can do with MyChart, go to [email or web address].    Your next appointment:   6-8 weeks  The format for your next appointment:   In Person  Provider:   You may see Ida Rogue, MD or one of the following Advanced Practice Providers on your designated Care Team:    Murray Hodgkins, NP  Christell Faith, PA-C  Marrianne Mood, PA-C  Laurann Montana, NP  Cadence Kathlen Mody, [place]  Other Instructions   Recommend low salt diet to help prevent your body from hanging onto excess fluid.   Recommend less than 2 liters of fluid per day to prevent your body from hanging onto extra fluid. You can gradually reduce this.
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
## 2020-04-01 ENCOUNTER — Other Ambulatory Visit: Payer: Self-pay

## 2020-04-01 ENCOUNTER — Telehealth: Payer: Self-pay | Admitting: Internal Medicine

## 2020-04-01 ENCOUNTER — Encounter: Payer: Self-pay | Admitting: Internal Medicine

## 2020-04-01 ENCOUNTER — Ambulatory Visit: Payer: Medicare HMO | Admitting: Internal Medicine

## 2020-04-01 VITALS — BP 130/72 | HR 67 | Temp 98.2°F | Resp 15 | Ht 65.0 in | Wt 225.6 lb

## 2020-04-01 DIAGNOSIS — R600 Localized edema: Secondary | ICD-10-CM

## 2020-04-01 DIAGNOSIS — Z1231 Encounter for screening mammogram for malignant neoplasm of breast: Secondary | ICD-10-CM | POA: Diagnosis not present

## 2020-04-01 DIAGNOSIS — E782 Mixed hyperlipidemia: Secondary | ICD-10-CM

## 2020-04-01 DIAGNOSIS — Z1379 Encounter for other screening for genetic and chromosomal anomalies: Secondary | ICD-10-CM

## 2020-04-01 DIAGNOSIS — L02429 Furuncle of limb, unspecified: Secondary | ICD-10-CM | POA: Insufficient documentation

## 2020-04-01 DIAGNOSIS — Z23 Encounter for immunization: Secondary | ICD-10-CM

## 2020-04-01 DIAGNOSIS — L02422 Furuncle of left axilla: Secondary | ICD-10-CM

## 2020-04-01 MED ORDER — IODOQUINOL-HC-ALOE POLYSACCH 1-2-1 % EX GEL: CUTANEOUS | 1 refills | Status: DC

## 2020-04-01 MED ORDER — SULFAMETHOXAZOLE-TRIMETHOPRIM 800-160 MG PO TABS: 1.0000 | ORAL_TABLET | Freq: Two times a day (BID) | ORAL | 0 refills | Status: DC

## 2020-04-01 NOTE — Assessment & Plan Note (Signed)
Prescribing empiric septra DS for one week.  Warm compresses.

## 2020-04-01 NOTE — Patient Instructions (Signed)
I'm happy to order your MRI of the breasts.   Goal BP is 120/70

## 2020-04-01 NOTE — Assessment & Plan Note (Addendum)
Managed with Zetia. LDL is well above goal.  Coronary calcium score was 0 in 2020, but She has aortic atherosclerosis  On coronary CT .  Will recommend adding statin  Lab Results  Component Value Date   CHOL 268 (H) 03/20/2020   HDL 39 (L) 03/20/2020   LDLCALC 189 (H) 03/20/2020   LDLDIRECT 136.0 01/23/2019   TRIG 202 (H) 03/20/2020   CHOLHDL 6.9 03/20/2020

## 2020-04-01 NOTE — Assessment & Plan Note (Signed)
Cardiology has changed diuretic back to furosemide with MWF potassium supplementation

## 2020-04-01 NOTE — Assessment & Plan Note (Signed)
She is at slightly increased risk for BRCA due to a CHEK 2 mutation found on repeat genetic analysis done in Feb 2021.  Recommend referral to breast surgeon to discuss the risks and merits of breast MRI imaging.  3d mammogram should be done at the very least.

## 2020-04-01 NOTE — Progress Notes (Signed)
Subjective:  Patient ID: Tracey Wiley, female    DOB: 12-Aug-1955  Age: 64 y.o. MRN: [id number]  CC: The primary encounter diagnosis was Breast cancer screening by mammogram. Diagnoses of Need for immunization against influenza, Mixed hyperlipidemia, 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, Bilateral lower extremity edema, and Furuncle of left axilla were also pertinent to this visit.  HPI [name] presents for follow up on chronic conditions including  hyperlipidemia, GERD and obesity  Nocturnal palpitations began  this summer .  Cardiac testing thus far has been  normal.  (arrhythmia monitor,  ECHO).  Follows up with [facility] soon  .  Tolerating meds including sotalol qid,   but not checking BP.  Has not had sleep study yet  Diastolic dysfunction discussed   Starting lasix this week.  Feels she has been retaining water and bloated.  already feels pant are less tight after a few days .  Aortic atherosclerosis noted on CT coronary   Mammogram needed .  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  She has asked her gynecologist to order a breast MRI but has not had a response yet and feels an urgency to do this. She has not seen a breast surgeon, only a Retail buyer.   Outpatient Medications Prior to Visit  Medication Sig Dispense Refill  . acetaminophen (TYLENOL) 500 MG tablet Take 500 mg by mouth daily as needed for pain.    Marland Kitchen albuterol (VENTOLIN HFA) 108 (90 Base) MCG/ACT inhaler Inhale 2 puffs into the lungs every 6 (six) hours as needed for wheezing or shortness of breath. 1 each 1  . aspirin EC 81 MG tablet Take 81 mg by mouth daily.    . calcium-vitamin D 250-100 MG-UNIT tablet Take 1 tablet by mouth 2 (two) times daily.    . celecoxib (CELEBREX) 200 MG capsule TAKE 1 CAPSULE BY MOUTH TWICE A DAY (Patient taking differently: as needed. )  60 capsule 2  . chlorhexidine (PERIDEX) 0.12 % solution GARGLE AND SPIT 15 MLS IN THE MOUTH OR THROAT PRN. 473 mL 0  . Echinacea 125 MG CAPS Take by mouth.    . esomeprazole (NEXIUM) 40 MG capsule TAKE 1 CAPSULE (40 MG TOTAL) BY MOUTH DAILY AT 12 NOON. 30 capsule 3  . ezetimibe (ZETIA) 10 MG tablet Take 1 tablet (10 mg total) by mouth daily. 90 tablet 1  . furosemide (LASIX) 20 MG tablet Take 1 tablet (20 mg total) by mouth daily. 90 tablet 1  . hyoscyamine (LEVSIN SL) 0.125 MG SL tablet Place 1 tablet (0.125 mg total) under the tongue every 4 (four) hours as needed for cramping. 60 tablet 3  . ibuprofen (ADVIL,MOTRIN) 200 MG tablet Take by mouth as needed.     . magnesium gluconate (MAGONATE) 500 MG tablet Take 500 mg by mouth 2 (two) times daily.    . ondansetron (ZOFRAN) 4 MG tablet Take 1 tablet (4 mg total) by mouth every 6 (six) hours as needed for nausea or vomiting. 10 tablet 0  . potassium chloride (KLOR-CON) 10 MEQ tablet Take 1 tablet (10 mEq total) by mouth 3 (three) times a week. 40 tablet 1  . sotalol (BETAPACE) 80 MG tablet TAKE 1 TABLET (80 MG TOTAL) BY MOUTH 2 (TWO) TIMES DAILY. 180 tablet 3  . triamterene-hydrochlorothiazide (DYAZIDE) 37.5-25 MG capsule TAKE  1 CAPSULE BY MOUTH EVERY DAY 30 capsule 5  . valACYclovir (VALTREX) 1000 MG tablet TAKE 1 TABLET BY MOUTH DAILY FOR SUPPRESSION , OR 5 TIMES DAILY FOR FLARE 35 tablet 2  . vitamin C (ASCORBIC ACID) 500 MG tablet Take 500 mg by mouth daily.    Marland Kitchen zinc gluconate 50 MG tablet Take 50 mg by mouth daily.     No facility-administered medications prior to visit.    Review of Systems;  Patient denies headache, fevers, malaise, unintentional weight loss, skin rash, eye pain, sinus congestion and sinus pain, sore throat, dysphagia,  hemoptysis , cough, dyspnea, wheezing, chest pain, palpitations, orthopnea, edema, abdominal pain, nausea, melena, diarrhea, constipation, flank pain, dysuria, hematuria, urinary  Frequency, nocturia,  numbness, tingling, seizures,  Focal weakness, Loss of consciousness,  Tremor, insomnia, depression, anxiety, and suicidal ideation.      Objective:  BP 130/72 (BP Location: Left Arm, Patient Position: Sitting, Cuff Size: Large)   Pulse 67   Temp 98.2 F (36.8 C) (Oral)   Resp 15   Ht _0  (1.651 m)   Wt 225 lb 9.6 oz (102.3 kg)   SpO2 96%   BMI 37.54 kg/m   BP Readings from Last 3 Encounters:  04/01/20 130/72  03/24/20 112/80  02/21/20 134/86    Wt Readings from Last 3 Encounters:  04/01/20 225 lb 9.6 oz (102.3 kg)  03/24/20 227 lb (103 kg)  03/04/20 220 lb (99.8 kg)    General appearance: alert, cooperative and appears stated age Ears: normal TM's and external ear canals both ears Throat: lips, mucosa, and tongue normal; teeth and gums normal Neck: no adenopathy, no carotid bruit, supple, symmetrical, trachea midline and thyroid not enlarged, symmetric, no tenderness/mass/nodules Back: symmetric, no curvature. ROM normal. No CVA tenderness. Lungs: clear to auscultation bilaterally Heart: regular rate and rhythm, S1, S2 normal, no murmur, click, rub or gallop Abdomen: soft, non-tender; bowel sounds normal; no masses,  no organomegaly Pulses: 2+ and symmetric Skin: Skin color, texture, turgor normal. No rashes or lesions Lymph nodes: Cervical, supraclavicular, and axillary nodes normal.  Lab Results  Component Value Date   HGBA1C 6.2 (H) 03/20/2020   HGBA1C 6.2 01/23/2019   HGBA1C 6.3 07/18/2018    Lab Results  Component Value Date   CREATININE 0.89 03/20/2020   CREATININE 0.70 05/04/2019   CREATININE 0.79 01/23/2019    Lab Results  Component Value Date   WBC 8.8 05/04/2019   HGB 12.8 05/04/2019   HCT 39.1 05/04/2019   PLT 302 05/04/2019   GLUCOSE 114 (H) 03/20/2020   CHOL 268 (H) 03/20/2020   TRIG 202 (H) 03/20/2020   HDL 39 (L) 03/20/2020   LDLDIRECT 136.0 01/23/2019   LDLCALC 189 (H) 03/20/2020   ALT 19 03/20/2020   AST 23 03/20/2020   NA 139  03/20/2020   K 4.5 03/20/2020   CL 100 03/20/2020   CREATININE 0.89 03/20/2020   BUN 18 03/20/2020   CO2 29 03/20/2020   TSH 2.745 03/20/2020   HGBA1C 6.2 (H) 03/20/2020    No results found.  Assessment & Plan:   Problem List Items Addressed This Visit      Unprioritized   Hyperlipidemia    Managed with Zetia. LDL is well above goal.  Coronary calcium score was 0 in 2020, but She has aortic atherosclerosis  On coronary CT .  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  Recommend referral to breast surgeon to discuss the risks and merits of breast MRI imaging.  3d mammogram should be done at the very least.       Bilateral lower extremity edema    Cardiology has changed diuretic back to furosemide with MWF potassium supplementation       Boil, axilla    Prescribing empiric septra DS for one week.  Warm compresses.       Relevant Medications   Iodoquinol-HC-Aloe Polysacch 1-2-1 % GEL   sulfamethoxazole-trimethoprim (BACTRIM DS) 800-160 MG tablet    Other Visit Diagnoses    Breast cancer screening by mammogram    -  Primary   Relevant Orders   MM 3D SCREEN BREAST BILATERAL   Need for immunization against influenza       Relevant Orders   Flu Vaccine QUAD 36+ mos IM (Completed)     A total of 50 minutes was spent with patient more than half of which was spent in counseling patient on the above mentioned issues , discussing her case with colleagues,  reviewing and explaining recent labs and imaging studies done, and coordination of care.  I am having Walker Kehr start on Iodoquinol-HC-Aloe Polysacch and  sulfamethoxazole-trimethoprim. I am also having her maintain her acetaminophen, aspirin EC, ibuprofen, calcium-vitamin D, hyoscyamine, vitamin C, Echinacea, magnesium gluconate, celecoxib, valACYclovir, zinc gluconate, chlorhexidine, ondansetron, sotalol, triamterene-hydrochlorothiazide, esomeprazole, albuterol, ezetimibe, potassium chloride, and furosemide.  Meds ordered this encounter  Medications  . Iodoquinol-HC-Aloe Polysacch 1-2-1 % GEL    Sig: Apply twice daily as needed to affected area    Dispense:  48 g    Refill:  1  . sulfamethoxazole-trimethoprim (BACTRIM DS) 800-160 MG tablet    Sig: Take 1 tablet by mouth 2 (two) times daily.    Dispense:  14 tablet    Refill:  0    There are no discontinued medications.  Follow-up: Return in about 6 months (around 09/30/2020).   Crecencio Mc, MD

## 2020-04-01 NOTE — Telephone Encounter (Signed)
I spoke with Dr Bary Castilla about Leightyn's genetic profile and about the accuracy of breast MRI's for use in screening .  He has ordered MRI's in the past but no longer uses them for use in screening because they result in too many false positive (false alarms), leading to unnecessary biopsies.  He  only uses them for patients who already have a specific form of breast cancer, invasive lobular carcinoma,  for follow up/surveillance.  .  At this point, based on his experience and opinion,  I would warn her against getting it, but if she really wants it, I will order it.  He also offered to see her and talk to her genetic profile and her risk of breast cancer it if she would like an opinion from a breast surgeon .

## 2020-04-03 NOTE — Telephone Encounter (Signed)
Spoke with pt in regards to message below. Pt stated for right now she is going to put everything on hold and just go with the screening mammogram for now. Pt did say that if she has go through what she did last year and have all the other testing done she is going to want to talk with Dr. Bary Castilla and go with the MRI.

## 2020-05-14 ENCOUNTER — Other Ambulatory Visit
Admission: RE | Admit: 2020-05-14 | Discharge: 2020-05-14 | Disposition: A | Discharge status: Home / Self Care | Payer: Medicare HMO | Source: Ambulatory Visit | Attending: Family | Admitting: Family

## 2020-05-14 DIAGNOSIS — Z79899 Other long term (current) drug therapy: Secondary | ICD-10-CM | POA: Diagnosis present

## 2020-05-14 DIAGNOSIS — I5189 Other ill-defined heart diseases: Secondary | ICD-10-CM | POA: Insufficient documentation

## 2020-05-14 LAB — BASIC METABOLIC PANEL
Anion gap: 9 (ref 5–15)
BUN: 23 mg/dL (ref 8–23)
CO2: 29 mmol/L (ref 22–32)
Calcium: 8.9 mg/dL (ref 8.9–10.3)
Chloride: 101 mmol/L (ref 98–111)
Creatinine, Ser: 0.94 mg/dL (ref 0.44–1.00)
GFR, Estimated: >60 mL/min (ref >60)
Glucose, Bld: 122 mg/dL — ABNORMAL HIGH (ref 70–99)
Potassium: 3.4 mmol/L — ABNORMAL LOW (ref 3.5–5.1)
Sodium: 139 mmol/L (ref 135–145)

## 2020-05-14 LAB — MAGNESIUM: Magnesium: 2.2 mg/dL (ref 1.7–2.4)
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
## 2020-05-15 ENCOUNTER — Telehealth: Payer: Self-pay | Admitting: *Deleted

## 2020-05-15 DIAGNOSIS — I5189 Other ill-defined heart diseases: Secondary | ICD-10-CM

## 2020-05-15 DIAGNOSIS — Z79899 Other long term (current) drug therapy: Secondary | ICD-10-CM

## 2020-05-15 NOTE — Telephone Encounter (Signed)
Spoke to pt, notified of lab results and recc below. Pt verbalized understanding: she will incr potassium to 31mEq DAILY (previously taking 3 times per week) and will have repeat Bmet at ov on Monday 12/13 w/ Dr. Rockey Situ. Lab orders placed. Appt noted updated for 12/13. Pt has no questions at this time.

## 2020-05-15 NOTE — Telephone Encounter (Signed)
Attempted to call pt with lab results and to discuss recc below. No answer. LMOM TCB.

## 2020-05-15 NOTE — Telephone Encounter (Signed)
-----   Message from Loel Dubonnet, NP sent at 05/14/2020  5:12 PM EST ----- Normal magnesium and kidney function. Potassium mildly low. Let's increase to take her Potassium 57mEq daily. We can repeat BMP at her upcoming appt with TG.

## 2020-05-19 ENCOUNTER — Other Ambulatory Visit: Payer: Self-pay

## 2020-05-19 ENCOUNTER — Encounter: Payer: Self-pay | Admitting: Cardiovascular Disease

## 2020-05-19 ENCOUNTER — Ambulatory Visit: Payer: Medicare HMO | Admitting: Cardiovascular Disease

## 2020-05-19 VITALS — BP 120/72 | HR 60 | Ht 65.0 in | Wt 229.0 lb

## 2020-05-19 DIAGNOSIS — E785 Hyperlipidemia, unspecified: Secondary | ICD-10-CM | POA: Diagnosis not present

## 2020-05-19 DIAGNOSIS — Q2111 Secundum atrial septal defect: Secondary | ICD-10-CM

## 2020-05-19 DIAGNOSIS — R600 Localized edema: Secondary | ICD-10-CM

## 2020-05-19 DIAGNOSIS — I471 Supraventricular tachycardia: Secondary | ICD-10-CM

## 2020-05-19 DIAGNOSIS — Q211 Atrial septal defect: Secondary | ICD-10-CM

## 2020-05-19 DIAGNOSIS — R0602 Shortness of breath: Secondary | ICD-10-CM

## 2020-05-19 DIAGNOSIS — R002 Palpitations: Secondary | ICD-10-CM

## 2020-05-19 NOTE — Patient Instructions (Addendum)
Medication Instructions:  Hold your lasix and potassium pills 2-3 days a week due to possible dehydration    If you need a refill on your cardiac medications before your next appointment, please call your pharmacy.    Lab work: BMP in 2-3 weeks Walk into medical mall at the check in desk, they will direct you to lab registration, hours for labs are Monday-Friday 07:00am-5:30pm (no appointment necessary)  If your tests are completely normal, you will receive your results only by: Marland Kitchen MyChart Message (if you have MyChart) OR . A paper copy in the mail If you have any lab test that is abnormal or we need to change your treatment, we will call you to review the results.   Testing/Procedures: No new testing needed   Follow-Up: At [facility], you and your health needs are our priority.  As part of our continuing mission to provide you with exceptional heart care, we have created designated Provider Care Teams.  These Care Teams include your primary Cardiologist (physician) and Advanced Practice Providers (APPs -  Physician Assistants and Nurse Practitioners) who all work together to provide you with the care you need, when you need it.  . You will need a follow up appointment in 12 months  . Providers on your designated Care Team:   . Murray Hodgkins, NP . Christell Faith, PA-C . Marrianne Mood, PA-C  Any Other Special Instructions Will Be Listed Below (If Applicable).  COVID-19 Vaccine Information can be found at: [email or web address].uk For questions related to vaccine distribution or appointments, please email vaccine@[email or web address] .[email or web address] or call [phone].

## 2020-05-29 LAB — HM PAP SMEAR: HM Pap smear: NORMAL

## 2020-06-02 ENCOUNTER — Other Ambulatory Visit: Payer: Self-pay

## 2020-06-02 ENCOUNTER — Telehealth: Payer: Self-pay

## 2020-06-02 MED ORDER — POTASSIUM CHLORIDE ER 10 MEQ PO TBCR: 10.0000 meq | EXTENDED_RELEASE_TABLET | Freq: Every day | ORAL | 0 refills | Status: DC

## 2020-06-02 MED ORDER — AMOXICILLIN 500 MG PO CAPS
ORAL_CAPSULE | ORAL | 3 refills | Status: DC
Start: 2020-06-02 — End: 2020-08-04

## 2020-06-02 NOTE — Telephone Encounter (Signed)
rx for amoxicllin has been sent to [facility] .  With refills

## 2020-06-02 NOTE — Telephone Encounter (Signed)
Pt said she need amoxicillin 500 mg [facility] for a [facility] procedure she is having this week. She said this was not supposed to be removed from her medication list and she has to have it for her appointment. She also said the pharmacy should have sent this to [place] last week for her and her husband.

## 2020-06-09 ENCOUNTER — Other Ambulatory Visit: Payer: Self-pay | Admitting: Physician Assistant

## 2020-06-11 ENCOUNTER — Other Ambulatory Visit: Payer: Self-pay | Admitting: Cardiovascular Disease

## 2020-08-04 ENCOUNTER — Telehealth (INDEPENDENT_AMBULATORY_CARE_PROVIDER_SITE_OTHER): Payer: Medicare HMO | Admitting: Internal Medicine

## 2020-08-04 ENCOUNTER — Encounter: Payer: Self-pay | Admitting: Internal Medicine

## 2020-08-04 VITALS — BP 110/70 | Ht 65.0 in | Wt 229.0 lb

## 2020-08-04 DIAGNOSIS — M25531 Pain in right wrist: Secondary | ICD-10-CM | POA: Diagnosis not present

## 2020-08-04 DIAGNOSIS — R600 Localized edema: Secondary | ICD-10-CM

## 2020-08-04 DIAGNOSIS — Z87898 Personal history of other specified conditions: Secondary | ICD-10-CM | POA: Diagnosis not present

## 2020-08-04 DIAGNOSIS — M2550 Pain in unspecified joint: Secondary | ICD-10-CM

## 2020-08-04 DIAGNOSIS — M109 Gout, unspecified: Secondary | ICD-10-CM | POA: Diagnosis not present

## 2020-08-04 DIAGNOSIS — E782 Mixed hyperlipidemia: Secondary | ICD-10-CM

## 2020-08-04 DIAGNOSIS — R6889 Other general symptoms and signs: Secondary | ICD-10-CM

## 2020-08-04 DIAGNOSIS — M25532 Pain in left wrist: Secondary | ICD-10-CM

## 2020-08-04 DIAGNOSIS — E559 Vitamin D deficiency, unspecified: Secondary | ICD-10-CM

## 2020-08-04 MED ORDER — SULFAMETHOXAZOLE-TRIMETHOPRIM 800-160 MG PO TABS: 1.0000 | ORAL_TABLET | Freq: Two times a day (BID) | ORAL | 1 refills | Status: DC
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
## 2020-08-05 DIAGNOSIS — M0609 Rheumatoid arthritis without rheumatoid factor, multiple sites: Secondary | ICD-10-CM | POA: Insufficient documentation

## 2020-08-05 DIAGNOSIS — M2550 Pain in unspecified joint: Secondary | ICD-10-CM | POA: Insufficient documentation

## 2020-08-05 NOTE — Assessment & Plan Note (Signed)
Suspect recurrence given her diffuse pain

## 2020-08-05 NOTE — Assessment & Plan Note (Signed)
Aggravated by obesity and inactivity due to recurrent vertigo. Repeat a1c is needed  Lab Results  Component Value Date   HGBA1C 6.2 (H) 03/20/2020

## 2020-08-05 NOTE — Assessment & Plan Note (Signed)
Serologies to rule out PMR, RA  Ordered.  History of gout ,  Checking uric acid level. Will consider imaging hips if labs are normal

## 2020-08-05 NOTE — Assessment & Plan Note (Addendum)
Use of furosemide has been restricted to 4 days per week by Dr Rockey Situ. Lytes and Cr ordered

## 2020-08-07 ENCOUNTER — Other Ambulatory Visit: Payer: Self-pay | Admitting: Family

## 2020-08-07 ENCOUNTER — Other Ambulatory Visit: Payer: Self-pay | Admitting: Physician Assistant

## 2020-08-07 ENCOUNTER — Other Ambulatory Visit: Payer: Self-pay | Admitting: Cardiovascular Disease

## 2020-08-07 DIAGNOSIS — I5189 Other ill-defined heart diseases: Secondary | ICD-10-CM

## 2020-08-18 ENCOUNTER — Other Ambulatory Visit: Payer: Self-pay

## 2020-08-18 ENCOUNTER — Other Ambulatory Visit
Admission: RE | Admit: 2020-08-18 | Discharge: 2020-08-18 | Disposition: A | Discharge status: Home / Self Care | Payer: Medicare HMO | Attending: Internal Medicine | Admitting: Internal Medicine

## 2020-08-18 DIAGNOSIS — M25531 Pain in right wrist: Secondary | ICD-10-CM | POA: Diagnosis present

## 2020-08-18 DIAGNOSIS — E559 Vitamin D deficiency, unspecified: Secondary | ICD-10-CM | POA: Insufficient documentation

## 2020-08-18 DIAGNOSIS — E782 Mixed hyperlipidemia: Secondary | ICD-10-CM | POA: Diagnosis present

## 2020-08-18 DIAGNOSIS — M25532 Pain in left wrist: Secondary | ICD-10-CM | POA: Insufficient documentation

## 2020-08-18 DIAGNOSIS — Z87898 Personal history of other specified conditions: Secondary | ICD-10-CM

## 2020-08-18 DIAGNOSIS — M109 Gout, unspecified: Secondary | ICD-10-CM | POA: Diagnosis present

## 2020-08-18 DIAGNOSIS — R6889 Other general symptoms and signs: Secondary | ICD-10-CM | POA: Insufficient documentation

## 2020-08-18 LAB — COMPREHENSIVE METABOLIC PANEL
ALT: 16 U/L (ref 0–44)
AST: 23 U/L (ref 15–41)
Albumin: 3.6 g/dL (ref 3.5–5.0)
Alkaline Phosphatase: 57 U/L (ref 38–126)
Anion gap: 8 (ref 5–15)
BUN: 16 mg/dL (ref 8–23)
CO2: 28 mmol/L (ref 22–32)
Calcium: 9.1 mg/dL (ref 8.9–10.3)
Chloride: 103 mmol/L (ref 98–111)
Creatinine, Ser: 0.77 mg/dL (ref 0.44–1.00)
GFR, Estimated: >60 mL/min (ref >60)
Glucose, Bld: 113 mg/dL — ABNORMAL HIGH (ref 70–99)
Potassium: 3.8 mmol/L (ref 3.5–5.1)
Sodium: 139 mmol/L (ref 135–145)
Total Bilirubin: 0.7 mg/dL (ref 0.3–1.2)
Total Protein: 7.5 g/dL (ref 6.5–8.1)

## 2020-08-18 LAB — CBC WITH DIFFERENTIAL/PLATELET
Abs Immature Granulocytes: 0.01 10*3/uL (ref 0.00–0.07)
Basophils Absolute: 0.1 10*3/uL (ref 0.0–0.1)
Basophils Relative: 1 %
Eosinophils Absolute: 0.2 10*3/uL (ref 0.0–0.5)
Eosinophils Relative: 3 %
HCT: 40 % (ref 36.0–46.0)
Hemoglobin: 13.3 g/dL (ref 12.0–15.0)
Immature Granulocytes: 0 %
Lymphocytes Relative: 34 %
Lymphs Abs: 3 10*3/uL (ref 0.7–4.0)
MCH: 28.1 pg (ref 26.0–34.0)
MCHC: 33.3 g/dL (ref 30.0–36.0)
MCV: 84.6 fL (ref 80.0–100.0)
Monocytes Absolute: 0.5 10*3/uL (ref 0.1–1.0)
Monocytes Relative: 6 %
Neutro Abs: 4.9 10*3/uL (ref 1.7–7.7)
Neutrophils Relative %: 56 %
Platelets: 286 10*3/uL (ref 150–400)
RBC: 4.73 MIL/uL (ref 3.87–5.11)
RDW: 14.4 % (ref 11.5–15.5)
WBC: 8.6 10*3/uL (ref 4.0–10.5)
nRBC: 0 % (ref 0.0–0.2)

## 2020-08-18 LAB — LIPID PANEL
Cholesterol: 252 mg/dL — ABNORMAL HIGH (ref 0–200)
HDL: 41 mg/dL (ref >40)
LDL Cholesterol: 184 mg/dL — ABNORMAL HIGH (ref 0–99)
Total CHOL/HDL Ratio: 6.1 RATIO
Triglycerides: 137 mg/dL (ref <150)
VLDL: 27 mg/dL (ref 0–40)

## 2020-08-18 LAB — SEDIMENTATION RATE: Sed Rate: 57 mm/hr — ABNORMAL HIGH (ref 0–30)

## 2020-08-18 LAB — VITAMIN D 25 HYDROXY (VIT D DEFICIENCY, FRACTURES): Vit D, 25-Hydroxy: 57.44 ng/mL (ref 30–100)

## 2020-08-18 LAB — HEMOGLOBIN A1C
Hgb A1c MFr Bld: 6 % — ABNORMAL HIGH (ref 4.8–5.6)
Mean Plasma Glucose: 125.5 mg/dL

## 2020-08-18 LAB — URIC ACID: Uric Acid, Serum: 6.5 mg/dL (ref 2.5–7.1)

## 2020-08-18 LAB — C-REACTIVE PROTEIN: CRP: 2.9 mg/dL — ABNORMAL HIGH (ref <1.0)

## 2020-08-19 LAB — ANA: Anti Nuclear Antibody (ANA): NEGATIVE

## 2020-08-20 ENCOUNTER — Telehealth: Payer: Self-pay | Admitting: *Deleted

## 2020-08-20 NOTE — Telephone Encounter (Signed)
Spoke to pt. Notified of lab results and provider's recc.  Pt states that her PCP has advised she continue to hold Zetia d/t medication history of multiple side effects. Mainly while they are still in process of work up to determine cause of pt's recent chronic pain, looking at several possibilities including autoimmune causes, in addition to holding off on new medications. She does have follow up with PCP 4/26 and is waiting for call back from their office prior to appt.  Pt verbalizes understanding that starting Zetia is recommended, pt states she will call our office to let us know if/when she does start this.

## 2020-08-20 NOTE — Telephone Encounter (Signed)
Thanks for the FYI. Will defer to PCP at this time.   Loel Dubonnet, NP

## 2020-08-20 NOTE — Telephone Encounter (Signed)
-----   Message from Loel Dubonnet, NP sent at 08/19/2020  8:08 PM EDT ----- Normal kidney function, liver function, electrolytes. Of note - remainder of her labs were collected by PCP which shows still elevated LDL (bad cholesterol). Would recommend starting Zetia as previously recommended if she has not yet started.

## 2020-08-23 ENCOUNTER — Other Ambulatory Visit: Payer: Self-pay | Admitting: Internal Medicine

## 2020-08-23 DIAGNOSIS — M130 Polyarthritis, unspecified: Secondary | ICD-10-CM

## 2020-08-23 DIAGNOSIS — M2550 Pain in unspecified joint: Secondary | ICD-10-CM

## 2020-09-06 ENCOUNTER — Other Ambulatory Visit: Payer: Self-pay | Admitting: Cardiovascular Disease

## 2020-09-30 ENCOUNTER — Ambulatory Visit: Payer: Medicare HMO | Admitting: Internal Medicine

## 2020-09-30 ENCOUNTER — Other Ambulatory Visit: Payer: Self-pay

## 2020-09-30 ENCOUNTER — Encounter: Payer: Self-pay | Admitting: Internal Medicine

## 2020-09-30 DIAGNOSIS — F411 Generalized anxiety disorder: Secondary | ICD-10-CM

## 2020-09-30 DIAGNOSIS — E782 Mixed hyperlipidemia: Secondary | ICD-10-CM

## 2020-09-30 DIAGNOSIS — M0609 Rheumatoid arthritis without rheumatoid factor, multiple sites: Secondary | ICD-10-CM

## 2020-09-30 DIAGNOSIS — R7303 Prediabetes: Secondary | ICD-10-CM | POA: Diagnosis not present

## 2020-09-30 MED ORDER — ROSUVASTATIN CALCIUM 10 MG PO TABS: 10.0000 mg | ORAL_TABLET | Freq: Every day | ORAL | 3 refills | Status: DC

## 2020-09-30 NOTE — Assessment & Plan Note (Signed)
Multifactorial , focused on health issues .  counselling given

## 2020-09-30 NOTE — Progress Notes (Signed)
Subjective:  Patient ID: Tracey Wiley, female    DOB: Jun 19, 1955  Age: 65 y.o. 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  HPI [name] presents for FOLLOW UP on multiple issues  POLYARTHRITIS:  She was referred to rheumatology for evaluation.  RA SUSPECTED due to Elevated CCP  At  165  DR  PATEL DX ?? RX'D PREDNSIONE 4 WEEK TAPER starting at 20 mg daily  ALONG with placquenil , but patient refused to start placquenil right away due to fear of side effects  Hyperlipidemia:  Has been holding off on starting Zetia due to fear of side effects.  She has a history of body aches on Mevacor,  zocor caused flu like body aches ,  Followed by Crestor trial which she can't remember why she stopped  .  Was afraid of needles so did not start Repatha. .  Discussed aortic atherosclerosis seen on CT, despite a Zero calcium score in October  2020. . Willing to retry crestor 2/week   Taking 4000 Ius D3 daily,  Most recent level was 46 in early April   Anxiety about health :  She remains concerned that despite her tentative diagnosis of RA, that her physicians have missed an important diagnosis , namely Cushing's disease.  She attributes her fear to her belief that 4 very important women in her life died because they were misdiagnosed .     Outpatient Medications Prior to Visit  Medication Sig Dispense Refill  . acetaminophen (TYLENOL) 500 MG tablet Take 500 mg by mouth daily as needed for pain.    Marland Kitchen albuterol (VENTOLIN HFA) 108 (90 Base) MCG/ACT inhaler Inhale 2 puffs into the lungs every 6 (six) hours as needed for wheezing or shortness of breath. 1 each 1  . aspirin EC 81 MG tablet Take 81 mg by mouth daily.    . calcium-vitamin D 250-100 MG-UNIT tablet Take 1 tablet by mouth 2 (two) times daily.    . chlorhexidine (PERIDEX) 0.12 % solution GARGLE AND SPIT 15  MLS IN THE MOUTH OR THROAT PRN. 473 mL 0  . Echinacea 125 MG CAPS Take by mouth.    . esomeprazole (NEXIUM) 40 MG capsule TAKE 1 CAPSULE (40 MG TOTAL) BY MOUTH DAILY AT 12 NOON. 90 capsule 0  . furosemide (LASIX) 20 MG tablet Take 1 tablet (20 mg total) by mouth as directed. Take 1 tablet 4 days per week as directed 90 tablet 0  . hyoscyamine (LEVSIN SL) 0.125 MG SL tablet Place 1 tablet (0.125 mg total) under the tongue every 4 (four) hours as needed for cramping. 60 tablet 3  . ibuprofen (ADVIL,MOTRIN) 200 MG tablet Take by mouth as needed.     Jerrye Beavers Polysacch 1-2-1 % GEL Apply twice daily as needed to affected area 48 g 1  . magnesium gluconate (MAGONATE) 500 MG tablet Take 500 mg by mouth 2 (two) times daily.    . ondansetron (ZOFRAN) 4 MG tablet Take 1 tablet (4 mg total) by mouth every 6 (six) hours as needed for nausea or vomiting. 10 tablet 0  . potassium chloride (KLOR-CON) 10 MEQ tablet Take 1 tablet 4 times a week. 16 tablet 3  . sotalol (BETAPACE) 80 MG tablet TAKE 1 TABLET (80 MG TOTAL) BY MOUTH 2 (TWO) TIMES DAILY. 180 tablet 2  . sulfamethoxazole-trimethoprim (BACTRIM DS) 800-160 MG tablet Take 1 tablet by  mouth 2 (two) times daily. 14 tablet 1  . triamterene-hydrochlorothiazide (DYAZIDE) 37.5-25 MG capsule TAKE 1 CAPSULE BY MOUTH EVERY DAY 90 capsule 2  . valACYclovir (VALTREX) 1000 MG tablet TAKE 1 TABLET BY MOUTH DAILY FOR SUPPRESSION , OR 5 TIMES DAILY FOR FLARE 35 tablet 2  . vitamin C (ASCORBIC ACID) 500 MG tablet Take 500 mg by mouth daily.    Marland Kitchen zinc gluconate 50 MG tablet Take 50 mg by mouth daily.     No facility-administered medications prior to visit.    Review of Systems;  Patient denies headache, fevers, malaise, unintentional weight loss, skin rash, eye pain, sinus congestion and sinus pain, sore throat, dysphagia,  hemoptysis , cough, dyspnea, wheezing, chest pain, palpitations, orthopnea, edema, abdominal pain, nausea, melena, diarrhea,  constipation, flank pain, dysuria, hematuria, urinary  Frequency, nocturia, numbness, tingling, seizures,  Focal weakness, Loss of consciousness,  Tremor, insomnia, depression, anxiety, and suicidal ideation.      Objective:  BP 120/82 (BP Location: Left Arm, Patient Position: Sitting, Cuff Size: Large)   Pulse 77   Temp (!) 96.8 F (36 C) (Oral)   Resp 16   Ht 5\' 5"  (1.651 m)   Wt 227 lb 12.8 oz (103.3 kg)   SpO2 97%   BMI 37.91 kg/m   BP Readings from Last 3 Encounters:  09/30/20 120/82  08/04/20 110/70  05/19/20 120/72    Wt Readings from Last 3 Encounters:  09/30/20 227 lb 12.8 oz (103.3 kg)  08/04/20 229 lb (103.9 kg)  05/19/20 229 lb (103.9 kg)    General appearance: alert, cooperative and appears stated age Ears: normal TM's and external ear canals both ears Throat: lips, mucosa, and tongue normal; teeth and gums normal Neck: no adenopathy, no carotid bruit, supple, symmetrical, trachea midline and thyroid not enlarged, symmetric, no tenderness/mass/nodules Back: symmetric, no curvature. ROM normal. No CVA tenderness. Lungs: clear to auscultation bilaterally Heart: regular rate and rhythm, S1, S2 normal, no murmur, click, rub or gallop Abdomen: soft, non-tender; bowel sounds normal; no masses,  no organomegaly Pulses: 2+ and symmetric Skin: Skin color, texture, turgor normal. No rashes or lesions Lymph nodes: Cervical, supraclavicular, and axillary nodes normal.  Lab Results  Component Value Date   HGBA1C 6.0 (H) 08/18/2020   HGBA1C 6.2 (H) 03/20/2020   HGBA1C 6.2 01/23/2019    Lab Results  Component Value Date   CREATININE 0.77 08/18/2020   CREATININE 0.94 05/14/2020   CREATININE 0.89 03/20/2020    Lab Results  Component Value Date   WBC 8.6 08/18/2020   HGB 13.3 08/18/2020   HCT 40.0 08/18/2020   PLT 286 08/18/2020   GLUCOSE 113 (H) 08/18/2020   CHOL 252 (H) 08/18/2020   TRIG 137 08/18/2020   HDL 41 08/18/2020   LDLDIRECT 136.0 01/23/2019    LDLCALC 184 (H) 08/18/2020   ALT 16 08/18/2020   AST 23 08/18/2020   NA 139 08/18/2020   K 3.8 08/18/2020   CL 103 08/18/2020   CREATININE 0.77 08/18/2020   BUN 16 08/18/2020   CO2 28 08/18/2020   TSH 2.745 03/20/2020   HGBA1C 6.0 (H) 08/18/2020    No results found.  Assessment & Plan:   Problem List Items Addressed This Visit      Unprioritized   Generalized anxiety disorder    Multifactorial , focused on health issues .  counselling given       Hyperlipidemia    Long discussion about the goal of treatment.  She is agreeable  to a restart of  crestor 10 mg once weekly dosing , then twice weekly If tolerated.  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 He has initiated therapy with a 4 week prednisone taper and placquenil, but she has deferred placquenil for now.       Prediabetes    Aggravated by obesity and inactivity due to recurrent vertigo. Repeat a1c is needed  Lab Results  Component Value Date   HGBA1C 6.0 (H) 08/18/2020    Lab Results  Component Value Date   HGBA1C 6.0 (H) 08/18/2020           A total of 40 minutes was spent with patient more than half of which was spent in counseling patient on the above mentioned issues , reviewing and explaining recent labs and imaging studies done, and coordination of care.  I am having Walker Kehr start on rosuvastatin. I am also having her maintain her acetaminophen, aspirin EC, ibuprofen, calcium-vitamin D, hyoscyamine, vitamin C, Echinacea, magnesium gluconate, valACYclovir, zinc gluconate, chlorhexidine, ondansetron, albuterol, Iodoquinol-HC-Aloe Polysacch,  sulfamethoxazole-trimethoprim, esomeprazole, sotalol, triamterene-hydrochlorothiazide, furosemide, and potassium chloride.  Meds ordered this encounter  Medications  . rosuvastatin (CRESTOR) 10 MG tablet    Sig: Take 1 tablet (10 mg total) by mouth daily.    Dispense:  30 tablet    Refill:  3    There are no discontinued medications.  Follow-up: Return in about 8 weeks (around 11/24/2020).   Crecencio Mc, MD

## 2020-09-30 NOTE — Assessment & Plan Note (Signed)
She has been screened by Rheumatologist Dr Posey Pronto for various autoimmune diseases and appears to have been diagnosed with seronegative rheumatoid arthritis. He has initiated therapy with a 4 week prednisone taper and placquenil, but she has deferred placquenil for now.

## 2020-09-30 NOTE — Assessment & Plan Note (Addendum)
Long discussion about the goal of treatment.  She is agreeable to a restart of  crestor 10 mg once weekly dosing , then twice weekly If tolerated.  She prefers to  wait until after one month of prednisone

## 2020-09-30 NOTE — Patient Instructions (Signed)
I agree with starting back a trial of 10 mg CRESTOR  WHEN YOU FEEL READY,  NOT MORE THAN ONCE A WEEK TO BEGIN ,  FOLLOWED BY EVERY 3-4 DAYS IF YOU TOLERATE IT.  WE WILL REPEAT THE LIVER TESTS AFTER 3-4 WEEKS OF THERAPY

## 2020-09-30 NOTE — Assessment & Plan Note (Addendum)
Aggravated by obesity and inactivity due to recurrent vertigo. Repeat a1c is needed  Lab Results  Component Value Date   HGBA1C 6.0 (H) 08/18/2020    Lab Results  Component Value Date   HGBA1C 6.0 (H) 08/18/2020

## 2020-10-13 ENCOUNTER — Telehealth: Payer: Self-pay | Admitting: Cardiovascular Disease

## 2020-10-13 NOTE — Telephone Encounter (Signed)
Pt c/o medication issue:  1. Name of Medication: Plaquenil   2. How are you currently taking this medication (dosage and times per day)?  Not sure yet   3. Are you having a reaction (difficulty breathing--STAT)? No   4. What is your medication issue? Patient has new dx of rheumatoid arthritis and wants to know if Dr. Rockey Situ approves or would suggest another medication considering side effect of arrhythmia and hx of  Arrhythmia.

## 2020-10-21 ENCOUNTER — Ambulatory Visit: Payer: Medicare HMO | Admitting: Family

## 2020-10-21 NOTE — Telephone Encounter (Signed)
Attempted to reach pt, unable to get in touch via phone, LDM on VM (DPR approved). Advised on Dr. Donivan Scull response of starting new RA medication "Plaquneil"  "Data is not particularly strong on causing arrhythmias If it did contribute to arrhythmia we could certainly stop the medication or adapt to the cardiac medications"  Advised pt to monitor BP & HR while starting new medication, any concerns or reaction to reach out to Rheumatology. Any concerns or episode of arrhythmias, to call the office for an appt.

## 2020-10-21 NOTE — Telephone Encounter (Signed)
Data is not particularly strong on causing arrhythmias If it did contribute to arrhythmia we could certainly stop the medication or adapt to the cardiac medications

## 2020-11-21 ENCOUNTER — Telehealth: Payer: Self-pay | Admitting: Internal Medicine

## 2020-11-21 MED ORDER — SUCRALFATE 1 G PO TABS: 1.0000 g | ORAL_TABLET | Freq: Three times a day (TID) | ORAL | 0 refills | Status: DC

## 2020-11-21 NOTE — Telephone Encounter (Signed)
Tracey Wiley reports she takes Nexium 40mg  daily. Rheumatologist placed her on prednisone 20mg  daily for 1 week, then 15mg  for a week, then 10mg  for a week then 5mg  daily for a week. She started having epigastric pain and saw her cardiologist and was told it was not heart pain, that she may want to contact GI. She had a 10 day break off the prednisone and started to feel better, then Rheum started her back on prednisone 5mg  daily for 3 mth. She wants to know what Dr. Carlean Purl recommends, please advise.

## 2020-11-21 NOTE — Telephone Encounter (Signed)
Spoke with pt and she is aware.

## 2020-11-21 NOTE — Telephone Encounter (Signed)
I sent in carafate for her to tae while on porednisone

## 2020-11-21 NOTE — Telephone Encounter (Signed)
Left message for pt to call back  °

## 2020-11-21 NOTE — Telephone Encounter (Signed)
Inbound call from patient. States she have had a switch in medications from her other doctor and now it is causing problems with her stomach and esophagus. Wants to talk to someone about what can be done to help since these medications are what she have to take. Best contact number [phone]

## 2020-11-27 ENCOUNTER — Telehealth (INDEPENDENT_AMBULATORY_CARE_PROVIDER_SITE_OTHER): Payer: Medicare HMO | Admitting: Internal Medicine

## 2020-11-27 ENCOUNTER — Encounter: Payer: Self-pay | Admitting: Internal Medicine

## 2020-11-27 DIAGNOSIS — M0600 Rheumatoid arthritis without rheumatoid factor, unspecified site: Secondary | ICD-10-CM | POA: Diagnosis not present

## 2020-11-27 DIAGNOSIS — K2900 Acute gastritis without bleeding: Secondary | ICD-10-CM

## 2020-11-27 DIAGNOSIS — E782 Mixed hyperlipidemia: Secondary | ICD-10-CM

## 2020-11-27 NOTE — Patient Instructions (Signed)
I agree with continuing the carafate and prednisone FIRST  Start the Crestor first ,  at 5 mg (1/2 of the 10 mg tablet) every 3 days   After 2 weeks increase the crestor to 10 mg every 3 days (stronger dose,  same frequency)  After 2 weeks increase frequency to every 2 days 10 mg
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
## 2020-11-28 ENCOUNTER — Telehealth: Payer: Self-pay | Admitting: Cardiovascular Disease

## 2020-11-28 DIAGNOSIS — I5189 Other ill-defined heart diseases: Secondary | ICD-10-CM

## 2020-11-28 MED ORDER — FUROSEMIDE 20 MG PO TABS: 20.0000 mg | ORAL_TABLET | Freq: Every day | ORAL | 3 refills | Status: DC

## 2020-11-28 MED ORDER — POTASSIUM CHLORIDE ER 10 MEQ PO TBCR: 10.0000 meq | EXTENDED_RELEASE_TABLET | Freq: Every day | ORAL | 3 refills | Status: DC

## 2020-11-28 NOTE — Telephone Encounter (Signed)
Reach back out to Mrs. Mcwatters regarding her potassium and Lasix. She reports got refill and the pharmacy note on bottle read" take 4 tabs once a week of potassium". Which is incorrect, Lasix was out of refills and [facility] was also [facility].  Noted in EMR, script are written as furosemide (LASIX) 20 MG tablet 20 mg, Daily 0 ordered        Summary: Take 1 tablet (20 mg total) by mouth daily. Take 1 tablet 4 days per week as directed      potassium chloride (KLOR-CON) 10 MEQ tablet 10 mEq, Daily 3 ordered        Summary: Take 1 tablet (10 mEq total) by mouth daily. Take 1 tablet daily, 4 times a week.      Not sure how pharmacy filled script incorrectly, resent scripts in for correct dose with note to pharmacy explaining how pt is to suppose to take.  furosemide (LASIX) 20 MG tablet 20 mg, Daily 3 ordered        Summary: Take 1 tablet (20 mg total) by mouth daily. Take 1 tablet 4 days per week as directed (May take extra dose for swelling/edema)      Patient Sig: Take 1 tablet (20 mg total) by mouth daily. Take 1 tablet 4 days per week as directed (May take extra dose for swelling/edema)      potassium chloride (KLOR-CON) 10 MEQ tablet 10 mEq, Daily 3 ordered        Summary: Take 1 tablet (10 mEq total) by mouth daily. Take 1 tablet daily, 4 times a week. Take with Lasix (may take extra tab with extra lasix)      Patient Sig: Take 1 tablet (10 mEq total) by mouth daily. Take 1 tablet daily, 4 times a week. Take with Lasix (may take extra tab with extra lasix)     Mrs. Zobrist very thankful for the return call and clarification on her lasix and potassium, she is upset with [facility] being "very rude to me", gave apologies, otherwise pt grateful for the re-solvement and the scripts sent back in.

## 2020-11-28 NOTE — Telephone Encounter (Signed)
Patient states her pharmacy did not get the doseage correct for her Furosemide and Potassium. Please call to discuss.

## 2020-11-30 DIAGNOSIS — K2970 Gastritis, unspecified, without bleeding: Secondary | ICD-10-CM

## 2020-11-30 HISTORY — DX: Gastritis, unspecified, without bleeding: K29.70

## 2020-11-30 NOTE — Assessment & Plan Note (Signed)
Long discussion about the goal of treatment.  She is agreeable to a restart of  crestor 10 mg once weekly dosing , then twice weekly If tolerated.  She prefers to  wait until after one month of prednisone

## 2020-11-30 NOTE — Assessment & Plan Note (Signed)
Diagnosed by Dr Posey Pronto with synovitis on exam and elevated CCP..  Currently taking only prednisone with good response,  Due to patient's concern about use of placquenil .  Has developed steroid induced gastritis

## 2020-11-30 NOTE — Assessment & Plan Note (Signed)
Occurred in the setting of prednisone use for seronegative RA.  Continue carafate and PPI

## 2020-12-29 ENCOUNTER — Telehealth: Payer: Self-pay | Admitting: Internal Medicine

## 2020-12-29 NOTE — Telephone Encounter (Signed)
Inbound call from pt requesting a call back stating that she has questions regarding her medication Carafate. Please advise. Thanks.

## 2020-12-30 NOTE — Telephone Encounter (Signed)
Left her a message to call me back. 

## 2020-12-31 NOTE — Telephone Encounter (Signed)
I spoke with this sweet lady and she wants advise about her carafate medicine. She has to take her prednisone at least 6 more weeks. She is having a time getting the carafate into her routine. She said it says DO NOT TAKE WITH MEALS as it will bind with food and can cause obstructions. She definitely takes 2 a day, sometimes 3 pills. Her epigastric pain is gone. So she would like to know do you want her to continue or stop the carafate pills. FYI- she doesn't answer the phone before 10AM and it's okay to leave a detailed message. She is aware Dr Carlean Purl is out of the office and will continue taking the carafate until she hears back from [place].

## 2021-01-01 NOTE — Telephone Encounter (Signed)
Shahad informed and said thank you for the information.

## 2021-01-01 NOTE — Telephone Encounter (Signed)
Yes it can be tricky to time that right though I do not think it is as bad of a problem as written meaning it may not bind up medicines significantly.   However it is best to try to follow those guidelines but if she is not having pain she can stop using it and use it as needed

## 2021-01-06 ENCOUNTER — Other Ambulatory Visit: Payer: Self-pay | Admitting: Physician Assistant

## 2021-01-06 ENCOUNTER — Telehealth: Payer: Self-pay | Admitting: Cardiovascular Disease

## 2021-01-06 MED ORDER — ROSUVASTATIN CALCIUM 5 MG PO TABS: 5.0000 mg | ORAL_TABLET | Freq: Every day | ORAL | 1 refills | Status: DC

## 2021-01-06 NOTE — Telephone Encounter (Signed)
Ok to reduce dosage? See below

## 2021-01-06 NOTE — Telephone Encounter (Signed)
*  STAT* If patient is at the pharmacy, call can be transferred to refill team.   1. Which medications need to be refilled? (please list name of each medication and dose if known) rosuvastatin 5 MG 1 tablet daily  Patient states her PCP put her on 10 MG but she wants to go back to 5 MG - unable to cut in half   2. Which pharmacy/location (including street and city if local pharmacy) is [facility] sent to[facility] [place]  3. Do they need a 30 day or 90 day supply? 90 day

## 2021-01-08 ENCOUNTER — Other Ambulatory Visit: Payer: Self-pay | Admitting: Physician Assistant

## 2021-01-08 ENCOUNTER — Other Ambulatory Visit: Payer: Self-pay | Admitting: Cardiovascular Disease

## 2021-01-08 DIAGNOSIS — I5189 Other ill-defined heart diseases: Secondary | ICD-10-CM

## 2021-02-06 ENCOUNTER — Other Ambulatory Visit: Payer: Self-pay

## 2021-02-06 ENCOUNTER — Ambulatory Visit: Payer: Medicare HMO | Admitting: Internal Medicine

## 2021-02-06 VITALS — BP 130/72 | HR 67 | Temp 97.9°F | Resp 16 | Ht 65.0 in | Wt 231.4 lb

## 2021-02-06 DIAGNOSIS — Z79899 Other long term (current) drug therapy: Secondary | ICD-10-CM | POA: Diagnosis not present

## 2021-02-06 DIAGNOSIS — E782 Mixed hyperlipidemia: Secondary | ICD-10-CM

## 2021-02-06 DIAGNOSIS — M0609 Rheumatoid arthritis without rheumatoid factor, multiple sites: Secondary | ICD-10-CM | POA: Diagnosis not present

## 2021-02-06 MED ORDER — PROMETHAZINE HCL 12.5 MG PO TABS: 12.5000 mg | ORAL_TABLET | Freq: Three times a day (TID) | ORAL | 0 refills | Status: DC | PRN

## 2021-02-06 MED ORDER — ONDANSETRON 4 MG PO TBDP: 4.0000 mg | ORAL_TABLET | Freq: Three times a day (TID) | ORAL | 0 refills | Status: AC | PRN

## 2021-02-06 NOTE — Patient Instructions (Addendum)
1)  Get shingrix #1 after finishing prednisone (1 week later)    2) reduce prednisone to 5 mg every other day until done   3) wait 2 weeks from  Shingrix  vaccine to get covid booster   4) try  zetia  restart NOW    5) PCV 20 (pneumonia) vaccine 2 weeks after COVID VACCINE   6) You can start the methotrexate and folic acid anytime you feel ready to start it

## 2021-02-06 NOTE — Progress Notes (Signed)
Subjective:  Patient ID: Tracey Wiley, female    DOB: September 07, 1955  Age: 65 y.o. MRN: [id number]  CC: The primary encounter diagnosis was Long-term use of high-risk medication. Diagnoses of Rheumatoid arthritis of multiple sites with negative rheumatoid factor ([place]) and Mixed hyperlipidemia were also pertinent to this visit.  HPI Tracey Wiley presents for  Chief Complaint  Patient presents with   Follow-up   This visit occurred during the SARS-CoV-2 public health emergency.  Safety protocols were in place, including screening questions prior to the visit, additional usage of staff PPE, and extensive cleaning of exam room while observing appropriate contact time as indicated for disinfecting solutions.   Patient wishes to discuss recent rheumatology evaluation by Dr patel.  RA was diagnosed earlier with a positive antibody to CCP  ; patient wanted an anti CCP repeated and  bc of her improvement in prednisone use and he declined.    Has not started methotrexate yet.  Developed esophagitis from prednisone.  Took carafate for 6 weeks.prescribed by Carlean Purl bc she could not tolerate it any more due to a daily headache.  Was advised to suspend carafate and ha's resolved.   Has not started crestor ,  cardiology recommended zetia instead to minimize side effects . After 16 days she stopped it because it "kicked my ass".severe backache)     .  On Day 4 of suspension  back pain resolved.    Outpatient Medications Prior to Visit  Medication Sig Dispense Refill   acetaminophen (TYLENOL) 500 MG tablet Take 500 mg by mouth daily as needed for pain.     albuterol (VENTOLIN HFA) 108 (90 Base) MCG/ACT inhaler Inhale 2 puffs into the lungs every 6 (six) hours as needed for wheezing or shortness of breath. 1 each 1   aspirin EC 81 MG tablet Take 81 mg by mouth daily.     calcium-vitamin D 250-100 MG-UNIT tablet Take 1 tablet by mouth 2 (two) times daily.     chlorhexidine (PERIDEX) 0.12 % solution GARGLE  AND SPIT 15 MLS IN THE MOUTH OR THROAT PRN. 473 mL 0   Echinacea 125 MG CAPS Take by mouth.     esomeprazole (NEXIUM) 40 MG capsule Take 1 capsule (40 mg total) by mouth daily at 12 noon. **PLEASE CALL OFFICE TO SCHEDULE APPOINTMENT 90 capsule 0   folic acid (FOLVITE) 1 MG tablet Take 1 tablet by mouth daily. (Patient not taking: Reported on 11/27/2020)     furosemide (LASIX) 20 MG tablet TAKE 1 TABLET 4 DAYS PER WEEK AS DIRECTED 90 tablet 0   hyoscyamine (LEVSIN SL) 0.125 MG SL tablet Place 1 tablet (0.125 mg total) under the tongue every 4 (four) hours as needed for cramping. 60 tablet 3   ibuprofen (ADVIL,MOTRIN) 200 MG tablet Take by mouth as needed.  (Patient not taking: Reported on 11/27/2020)     Iodoquinol-HC-Aloe Polysacch 1-2-1 % GEL Apply twice daily as needed to affected area 48 g 1   magnesium gluconate (MAGONATE) 500 MG tablet Take 500 mg by mouth 2 (two) times daily.     methotrexate (RHEUMATREX) 2.5 MG tablet Take by mouth. (Patient not taking: Reported on 11/27/2020)     potassium chloride (KLOR-CON) 10 MEQ tablet Take 1 tablet (10 mEq total) by mouth daily. Take 1 tablet daily, 4 times a week. Take with Lasix (may take extra tab with extra lasix) 90 tablet 3   predniSONE (DELTASONE) 5 MG tablet Take 1 tablet by mouth daily. (Patient  not taking: Reported on 11/27/2020)     rosuvastatin (CRESTOR) 5 MG tablet Take 1 tablet (5 mg total) by mouth daily. 90 tablet 1   sotalol (BETAPACE) 80 MG tablet TAKE 1 TABLET (80 MG TOTAL) BY MOUTH 2 (TWO) TIMES DAILY. 180 tablet 2   sucralfate (CARAFATE) 1 g tablet Take 1 tablet (1 g total) by mouth 4 (four) times daily -  with meals and at bedtime. (Patient not taking: Reported on 11/27/2020) 360 tablet 0   sulfamethoxazole-trimethoprim (BACTRIM DS) 800-160 MG tablet Take 1 tablet by mouth 2 (two) times daily. (Patient not taking: Reported on 11/27/2020) 14 tablet 1   triamterene-hydrochlorothiazide (DYAZIDE) 37.5-25 MG capsule TAKE 1 CAPSULE BY MOUTH  EVERY DAY 90 capsule 2   valACYclovir (VALTREX) 1000 MG tablet TAKE 1 TABLET BY MOUTH DAILY FOR SUPPRESSION , OR 5 TIMES DAILY FOR FLARE 35 tablet 2   vitamin C (ASCORBIC ACID) 500 MG tablet Take 500 mg by mouth daily.     zinc gluconate 50 MG tablet Take 50 mg by mouth daily.     ondansetron (ZOFRAN) 4 MG tablet Take 1 tablet (4 mg total) by mouth every 6 (six) hours as needed for nausea or vomiting. 10 tablet 0   No facility-administered medications prior to visit.    Review of Systems;  Patient denies headache, fevers, malaise, unintentional weight loss, skin rash, eye pain, sinus congestion and sinus pain, sore throat, dysphagia,  hemoptysis , cough, dyspnea, wheezing, chest pain, palpitations, orthopnea, edema, abdominal pain, nausea, melena, diarrhea, constipation, flank pain, dysuria, hematuria, urinary  Frequency, nocturia, numbness, tingling, seizures,  Focal weakness, Loss of consciousness,  Tremor, insomnia, depression, anxiety, and suicidal ideation.      Objective:  BP 130/72   Pulse 67   Temp 97.9 F (36.6 C)   Resp 16   Ht '5\' 5"'$  (1.651 m)   Wt 231 lb 6.4 oz (105 kg)   SpO2 98%   BMI 38.51 kg/m   BP Readings from Last 3 Encounters:  02/06/21 130/72  11/27/20 136/76  09/30/20 120/82    Wt Readings from Last 3 Encounters:  02/06/21 231 lb 6.4 oz (105 kg)  09/30/20 227 lb 12.8 oz (103.3 kg)  08/04/20 229 lb (103.9 kg)    General appearance: alert, cooperative and appears stated age Ears: normal TM's and external ear canals both ears Throat: lips, mucosa, and tongue normal; teeth and gums normal Neck: no adenopathy, no carotid bruit, supple, symmetrical, trachea midline and thyroid not enlarged, symmetric, no tenderness/mass/nodules Back: symmetric, no curvature. ROM normal. No CVA tenderness. Lungs: clear to auscultation bilaterally Heart: regular rate and rhythm, S1, S2 normal, no murmur, click, rub or gallop Abdomen: soft, non-tender; bowel sounds normal; no  masses,  no organomegaly Pulses: 2+ and symmetric Skin: Skin color, texture, turgor normal. No rashes or lesions Lymph nodes: Cervical, supraclavicular, and axillary nodes normal.  Lab Results  Component Value Date   HGBA1C 6.0 (H) 08/18/2020   HGBA1C 6.2 (H) 03/20/2020   HGBA1C 6.2 01/23/2019    Lab Results  Component Value Date   CREATININE 0.83 02/06/2021   CREATININE 0.77 08/18/2020   CREATININE 0.94 05/14/2020    Lab Results  Component Value Date   WBC 8.6 08/18/2020   HGB 13.3 08/18/2020   HCT 40.0 08/18/2020   PLT 286 08/18/2020   GLUCOSE 102 (H) 02/06/2021   CHOL 252 (H) 08/18/2020   TRIG 137 08/18/2020   HDL 41 08/18/2020   LDLDIRECT 136.0 01/23/2019  LDLCALC 184 (H) 08/18/2020   ALT 16 02/06/2021   AST 20 02/06/2021   NA 139 02/06/2021   K 4.0 02/06/2021   CL 99 02/06/2021   CREATININE 0.83 02/06/2021   BUN 17 02/06/2021   CO2 28 02/06/2021   TSH 2.745 03/20/2020   HGBA1C 6.0 (H) 08/18/2020    No results found.  Assessment & Plan:   Problem List Items Addressed This Visit       Unprioritized   Hyperlipidemia    Did not tolerate Zetia trial  Due to onset of profound back pain.  Willing to retry medication to rule out coincidence.       Rheumatoid arthritis ([place])    Diagnosed by rheumatology with positive anti CCP.  Currently taking 5 mg prednisone but developed esophagitis which was treated by Dr Carlean Purl with carafate which caused a headache.  Weaning prednisone over next 2 weeks so methrotrexate can be started.    Lab Results  Component Value Date   ESRSEDRATE 58 (H) 02/06/2021   Lab Results  Component Value Date   CRP 33.0 (H) 02/06/2021          Relevant Orders   Sedimentation rate (Completed)   C-reactive protein (Completed)   Other Visit Diagnoses     Long-term use of high-risk medication    -  Primary   Relevant Orders   Sedimentation rate   Comprehensive metabolic panel (Completed)       I have discontinued Bevyn E.  Udell's ondansetron. I am also having her start on promethazine and ondansetron. Additionally, I am having her maintain her acetaminophen, aspirin EC, ibuprofen, calcium-vitamin D, hyoscyamine, vitamin C, Echinacea, magnesium gluconate, valACYclovir, zinc gluconate, chlorhexidine, albuterol, Iodoquinol-HC-Aloe Polysacch, sulfamethoxazole-trimethoprim, sotalol, triamterene-hydrochlorothiazide, sucralfate, folic acid, methotrexate, predniSONE, potassium chloride, rosuvastatin, esomeprazole, and furosemide.  Meds ordered this encounter  Medications   promethazine (PHENERGAN) 12.5 MG tablet    Sig: Take 1 tablet (12.5 mg total) by mouth every 8 (eight) hours as needed for nausea or vomiting.    Dispense:  20 tablet    Refill:  0   ondansetron (ZOFRAN ODT) 4 MG disintegrating tablet    Sig: Take 1 tablet (4 mg total) by mouth every 8 (eight) hours as needed for nausea or vomiting.    Dispense:  20 tablet    Refill:  0    Medications Discontinued During This Encounter  Medication Reason   ondansetron (ZOFRAN) 4 MG tablet     Follow-up: Return in about 8 weeks (around 04/03/2021).   Crecencio Mc, MD

## 2021-02-07 LAB — COMPREHENSIVE METABOLIC PANEL
AG Ratio: 1.4 (calc) (ref 1.0–2.5)
ALT: 16 U/L (ref 6–29)
AST: 20 U/L (ref 10–35)
Albumin: 4.2 g/dL (ref 3.6–5.1)
Alkaline phosphatase (APISO): 73 U/L (ref 37–153)
BUN: 17 mg/dL (ref 7–25)
CO2: 28 mmol/L (ref 20–32)
Calcium: 9.8 mg/dL (ref 8.6–10.4)
Chloride: 99 mmol/L (ref 98–110)
Creat: 0.83 mg/dL (ref 0.50–1.05)
Globulin: 3.1 g/dL (calc) (ref 1.9–3.7)
Glucose, Bld: 102 mg/dL — ABNORMAL HIGH (ref 65–99)
Potassium: 4 mmol/L (ref 3.5–5.3)
Sodium: 139 mmol/L (ref 135–146)
Total Bilirubin: 0.6 mg/dL (ref 0.2–1.2)
Total Protein: 7.3 g/dL (ref 6.1–8.1)

## 2021-02-07 LAB — C-REACTIVE PROTEIN: CRP: 33 mg/L — ABNORMAL HIGH (ref <8.0)

## 2021-02-07 LAB — SEDIMENTATION RATE: Sed Rate: 58 mm/h — ABNORMAL HIGH (ref 0–30)

## 2021-02-09 NOTE — Progress Notes (Signed)
Subjective:  Patient ID: Tracey Wiley, female    DOB: 08-10-1955  Age: 65 y.o. MRN: [id number]  CC: The primary encounter diagnosis was Long-term use of high-risk medication. Diagnoses of Rheumatoid arthritis of multiple sites with negative rheumatoid factor ([place]) and Mixed hyperlipidemia were also pertinent to this visit.  HPI LANISSA Wiley presents for  Chief Complaint  Patient presents with   Follow-up   This visit occurred during the SARS-CoV-2 public health emergency.  Safety protocols were in place, including screening questions prior to the visit, additional usage of staff PPE, and extensive cleaning of exam room while observing appropriate contact time as indicated for disinfecting solutions.   Patient wishes to discuss recent rheumatology evaluation by Dr patel.  RA was diagnosed earlier with a positive antibody to CCP  ; patient wanted an anti CCP repeated and  bc of her improvement in prednisone use and he declined.    Has not started methotrexate yet.  Developed esophagitis from prednisone.  Took carafate for 6 weeks.prescribed by Carlean Purl bc she could not tolerate it any more due to a daily headache.  Was advised to suspend carafate and ha's resolved.   Has not started crestor ,  cardiology recommended zetia instead to minimize side effects . After 16 days she stopped it because it "kicked my ass".severe backache)     .  On Day 4 of suspension  back pain resolved.    Outpatient Medications Prior to Visit  Medication Sig Dispense Refill   acetaminophen (TYLENOL) 500 MG tablet Take 500 mg by mouth daily as needed for pain.     albuterol (VENTOLIN HFA) 108 (90 Base) MCG/ACT inhaler Inhale 2 puffs into the lungs every 6 (six) hours as needed for wheezing or shortness of breath. 1 each 1   aspirin EC 81 MG tablet Take 81 mg by mouth daily.     calcium-vitamin D 250-100 MG-UNIT tablet Take 1 tablet by mouth 2 (two) times daily.     chlorhexidine (PERIDEX) 0.12 % solution GARGLE  AND SPIT 15 MLS IN THE MOUTH OR THROAT PRN. 473 mL 0   Echinacea 125 MG CAPS Take by mouth.     esomeprazole (NEXIUM) 40 MG capsule Take 1 capsule (40 mg total) by mouth daily at 12 noon. **PLEASE CALL OFFICE TO SCHEDULE APPOINTMENT 90 capsule 0   folic acid (FOLVITE) 1 MG tablet Take 1 tablet by mouth daily. (Patient not taking: Reported on 11/27/2020)     furosemide (LASIX) 20 MG tablet TAKE 1 TABLET 4 DAYS PER WEEK AS DIRECTED 90 tablet 0   hyoscyamine (LEVSIN SL) 0.125 MG SL tablet Place 1 tablet (0.125 mg total) under the tongue every 4 (four) hours as needed for cramping. 60 tablet 3   ibuprofen (ADVIL,MOTRIN) 200 MG tablet Take by mouth as needed.  (Patient not taking: Reported on 11/27/2020)     Iodoquinol-HC-Aloe Polysacch 1-2-1 % GEL Apply twice daily as needed to affected area 48 g 1   magnesium gluconate (MAGONATE) 500 MG tablet Take 500 mg by mouth 2 (two) times daily.     methotrexate (RHEUMATREX) 2.5 MG tablet Take by mouth. (Patient not taking: Reported on 11/27/2020)     potassium chloride (KLOR-CON) 10 MEQ tablet Take 1 tablet (10 mEq total) by mouth daily. Take 1 tablet daily, 4 times a week. Take with Lasix (may take extra tab with extra lasix) 90 tablet 3   predniSONE (DELTASONE) 5 MG tablet Take 1 tablet by mouth daily. (Patient  not taking: Reported on 11/27/2020)     rosuvastatin (CRESTOR) 5 MG tablet Take 1 tablet (5 mg total) by mouth daily. 90 tablet 1   sotalol (BETAPACE) 80 MG tablet TAKE 1 TABLET (80 MG TOTAL) BY MOUTH 2 (TWO) TIMES DAILY. 180 tablet 2   sucralfate (CARAFATE) 1 g tablet Take 1 tablet (1 g total) by mouth 4 (four) times daily -  with meals and at bedtime. (Patient not taking: Reported on 11/27/2020) 360 tablet 0   sulfamethoxazole-trimethoprim (BACTRIM DS) 800-160 MG tablet Take 1 tablet by mouth 2 (two) times daily. (Patient not taking: Reported on 11/27/2020) 14 tablet 1   triamterene-hydrochlorothiazide (DYAZIDE) 37.5-25 MG capsule TAKE 1 CAPSULE BY MOUTH  EVERY DAY 90 capsule 2   valACYclovir (VALTREX) 1000 MG tablet TAKE 1 TABLET BY MOUTH DAILY FOR SUPPRESSION , OR 5 TIMES DAILY FOR FLARE 35 tablet 2   vitamin C (ASCORBIC ACID) 500 MG tablet Take 500 mg by mouth daily.     zinc gluconate 50 MG tablet Take 50 mg by mouth daily.     ondansetron (ZOFRAN) 4 MG tablet Take 1 tablet (4 mg total) by mouth every 6 (six) hours as needed for nausea or vomiting. 10 tablet 0   No facility-administered medications prior to visit.    Review of Systems;  Patient denies headache, fevers, malaise, unintentional weight loss, skin rash, eye pain, sinus congestion and sinus pain, sore throat, dysphagia,  hemoptysis , cough, dyspnea, wheezing, chest pain, palpitations, orthopnea, edema, abdominal pain, nausea, melena, diarrhea, constipation, flank pain, dysuria, hematuria, urinary  Frequency, nocturia, numbness, tingling, seizures,  Focal weakness, Loss of consciousness,  Tremor, insomnia, depression, anxiety, and suicidal ideation.      Objective:  BP 130/72   Pulse 67   Temp 97.9 F (36.6 C)   Resp 16   Ht '5\' 5"'$  (1.651 m)   Wt 231 lb 6.4 oz (105 kg)   SpO2 98%   BMI 38.51 kg/m   BP Readings from Last 3 Encounters:  02/06/21 130/72  11/27/20 136/76  09/30/20 120/82    Wt Readings from Last 3 Encounters:  02/06/21 231 lb 6.4 oz (105 kg)  09/30/20 227 lb 12.8 oz (103.3 kg)  08/04/20 229 lb (103.9 kg)    General appearance: alert, cooperative and appears stated age Ears: normal TM's and external ear canals both ears Throat: lips, mucosa, and tongue normal; teeth and gums normal Neck: no adenopathy, no carotid bruit, supple, symmetrical, trachea midline and thyroid not enlarged, symmetric, no tenderness/mass/nodules Back: symmetric, no curvature. ROM normal. No CVA tenderness. Lungs: clear to auscultation bilaterally Heart: regular rate and rhythm, S1, S2 normal, no murmur, click, rub or gallop Abdomen: soft, non-tender; bowel sounds normal; no  masses,  no organomegaly Pulses: 2+ and symmetric Skin: Skin color, texture, turgor normal. No rashes or lesions Lymph nodes: Cervical, supraclavicular, and axillary nodes normal.  Lab Results  Component Value Date   HGBA1C 6.0 (H) 08/18/2020   HGBA1C 6.2 (H) 03/20/2020   HGBA1C 6.2 01/23/2019    Lab Results  Component Value Date   CREATININE 0.83 02/06/2021   CREATININE 0.77 08/18/2020   CREATININE 0.94 05/14/2020    Lab Results  Component Value Date   WBC 8.6 08/18/2020   HGB 13.3 08/18/2020   HCT 40.0 08/18/2020   PLT 286 08/18/2020   GLUCOSE 102 (H) 02/06/2021   CHOL 252 (H) 08/18/2020   TRIG 137 08/18/2020   HDL 41 08/18/2020   LDLDIRECT 136.0 01/23/2019  LDLCALC 184 (H) 08/18/2020   ALT 16 02/06/2021   AST 20 02/06/2021   NA 139 02/06/2021   K 4.0 02/06/2021   CL 99 02/06/2021   CREATININE 0.83 02/06/2021   BUN 17 02/06/2021   CO2 28 02/06/2021   TSH 2.745 03/20/2020   HGBA1C 6.0 (H) 08/18/2020    No results found.  Assessment & Plan:   Problem List Items Addressed This Visit       Unprioritized   Hyperlipidemia    Did not tolerate Zetia trial  Due to onset of profound back pain.  Willing to retry medication to rule out coincidence.       Rheumatoid arthritis ([place])    Diagnosed by rheumatology with positive anti CCP.  Currently taking 5 mg prednisone but developed esophagitis which was treated by Dr Carlean Purl with carafate which caused a headache.  Weaning prednisone over next 2 weeks so methrotrexate can be started.    Lab Results  Component Value Date   ESRSEDRATE 49 (H) 02/06/2021   Lab Results  Component Value Date   CRP 33.0 (H) 02/06/2021          Relevant Orders   Sedimentation rate (Completed)   C-reactive protein (Completed)   Other Visit Diagnoses     Long-term use of high-risk medication    -  Primary   Relevant Orders   Sedimentation rate   Comprehensive metabolic panel (Completed)      I provided  40 minutes of   face-to-face time during this encounter reviewing patient's current problems and recent workup by rheumatology, including labs and imaging studies, providing counseling on the above mentioned problems , and coordination  of care .  Meds ordered this encounter  Medications   promethazine (PHENERGAN) 12.5 MG tablet    Sig: Take 1 tablet (12.5 mg total) by mouth every 8 (eight) hours as needed for nausea or vomiting.    Dispense:  20 tablet    Refill:  0   ondansetron (ZOFRAN ODT) 4 MG disintegrating tablet    Sig: Take 1 tablet (4 mg total) by mouth every 8 (eight) hours as needed for nausea or vomiting.    Dispense:  20 tablet    Refill:  0    Medications Discontinued During This Encounter  Medication Reason   ondansetron (ZOFRAN) 4 MG tablet     Follow-up: Return in about 8 weeks (around 04/03/2021).   Crecencio Mc, MD

## 2021-02-09 NOTE — Assessment & Plan Note (Addendum)
Diagnosed by rheumatology with positive anti CCP.  Currently taking 5 mg prednisone but developed esophagitis which was treated by Dr Carlean Purl with carafate which caused a headache.  Weaning prednisone over next 2 weeks so methrotrexate can be started.    Lab Results  Component Value Date   ESRSEDRATE 58 (H) 02/06/2021   Lab Results  Component Value Date   CRP 33.0 (H) 02/06/2021

## 2021-02-09 NOTE — Assessment & Plan Note (Signed)
Did not tolerate Zetia trial  Due to onset of profound back pain.  Willing to retry medication to rule out coincidence.

## 2021-02-10 ENCOUNTER — Telehealth: Payer: Self-pay

## 2021-02-10 NOTE — Telephone Encounter (Signed)
Patient returned office phone call for lab results. 

## 2021-02-10 NOTE — Telephone Encounter (Signed)
LMTCB in regards to lab results.  

## 2021-02-11 ENCOUNTER — Other Ambulatory Visit: Payer: Self-pay | Admitting: Cardiovascular Disease

## 2021-02-11 ENCOUNTER — Other Ambulatory Visit: Payer: Self-pay | Admitting: Physician Assistant

## 2021-02-11 DIAGNOSIS — I5189 Other ill-defined heart diseases: Secondary | ICD-10-CM

## 2021-02-11 NOTE — Telephone Encounter (Signed)
Attempted to call. Phone did not go to voicemail today.

## 2021-02-11 NOTE — Telephone Encounter (Signed)
Patient is returning your call from earlier about her lab results.

## 2021-02-12 NOTE — Telephone Encounter (Signed)
See result note message 

## 2021-02-13 ENCOUNTER — Telehealth: Payer: Self-pay | Admitting: Cardiovascular Disease

## 2021-02-13 DIAGNOSIS — E782 Mixed hyperlipidemia: Secondary | ICD-10-CM

## 2021-02-13 DIAGNOSIS — Z79899 Other long term (current) drug therapy: Secondary | ICD-10-CM

## 2021-02-13 NOTE — Telephone Encounter (Signed)
Patient scheduled for visit on 9-13 and wants to know if Dr. Rockey Situ will order fasting labs prior to visit so results can be addressed.

## 2021-02-13 NOTE — Telephone Encounter (Signed)
Fasting Lipid placed at [facility] per pt request.  Mrs. Kimery notified   Walk into medical mall at the check in desk, they will direct you to lab registration, hours for labs are Monday-Friday 07:00am-5:30pm (no appointment necessary)  Pt reports wants these to be reviewed at her appt on 9/13 as that she cannot take her Zetia and Crestor as order d/t side affects.   Order placed, pt is very thankful, see at [place] next Tuesday.

## 2021-02-16 ENCOUNTER — Other Ambulatory Visit
Admission: RE | Admit: 2021-02-16 | Discharge: 2021-02-16 | Disposition: A | Discharge status: Home / Self Care | Payer: Medicare HMO | Attending: Cardiovascular Disease | Admitting: Cardiovascular Disease

## 2021-02-16 DIAGNOSIS — E782 Mixed hyperlipidemia: Secondary | ICD-10-CM

## 2021-02-16 DIAGNOSIS — Z79899 Other long term (current) drug therapy: Secondary | ICD-10-CM | POA: Insufficient documentation

## 2021-02-16 LAB — LIPID PANEL
Cholesterol: 237 mg/dL — ABNORMAL HIGH (ref 0–200)
HDL: 45 mg/dL (ref >40)
LDL Cholesterol: 159 mg/dL — ABNORMAL HIGH (ref 0–99)
Total CHOL/HDL Ratio: 5.3 RATIO
Triglycerides: 164 mg/dL — ABNORMAL HIGH (ref <150)
VLDL: 33 mg/dL (ref 0–40)

## 2021-02-16 NOTE — Progress Notes (Signed)
Cardiology Office Note  Date:  02/17/2021   ID:  Tracey Wiley, DOB 08/26/1955, MRN [id number]  PCP:  Crecencio Mc, MD   Chief Complaint  Patient presents with   Follow-up    Patient c/o rheumatoid arthritis causing the C-reactive protein to be elevated or is there a problem with her heart. Medications reviewed by the patient verbally.     HPI:  Tracey Wiley is a very pleasant 65 year old woman with a  history of  PFO,  with  closure device in April 2008 performed in [place], hyperlipidemia,  PAT versus paroxysmal atrial fibrillation on sotalol, hypertension,  Bell's palsy gout GERD Significant baseline stress, continues to care for her ex-husband who has underlying cognitive issues who presents today for follow-up of her arrhythmia  New dx of rheumatoid arthitis CRP 2.9 in 08/2020  not on prednisone to 33 in 02/06/2021 on low dose prednisone 5 mg  Stopped zetia 1 weeks before crp, back pain side effects? Sed rate 58 Dx with rheumatoid artritis April 2022, started on prednisone Followed by rheumatology, appt: oct  Other labs A1C 6.0  some sx of Chest pain Burning and stabbing Concerned about unstable angina Unable to treadmill or do regular exercise  Lab work reviewed Total chol 159 down from 184 on zetia (back on for 4 days)  EKG personally reviewed by myself on todays visit Normal sinus rhythm rate 65 bpm PVCs  Got over RSV, Sept and oct 2021  She had a event monitor: reaction to adhesive Results discussed 33 Supraventricular Tachycardia runs occurred, the run with the fastest interval lasting 5 beats with a max rate of 226 bpm,  the longest lasting 21.1 secs with an avg rate of 120 bpm.  Isolated SVEs were rare (<1.0%), SVE Couplets were rare (<1.0%), and SVE Triplets were rare (<1.0%). Isolated VEs were rare (<1.0%, 217), VE Couplets were rare (<1.0%, 26), and VE Triplets were rare (<1.0%, 2). Most patient triggered events (shortness of breath,  dizziness/fluttering) were not associated with significant arrhythmia.  Recently given Zetia for cholesterol, has not started it yet Off zocor secondary to suspected myalgias  Echo 03/2020 results discussed  1. Left ventricular ejection fraction, by estimation, is 55%. The left  ventricle has normal function. The left ventricle has no regional wall  motion abnormalities. Left ventricular diastolic parameters are consistent  with Grade II diastolic dysfunction  (pseudonormalization).   2. Right ventricular systolic function is normal. The right ventricular  size is normal.   Labs reviewed Total chol 257, LDL 165 Off zocor, myalgias better,  Also had myalgias on Lipitor  Previously mentioned having a problem with her uterus, was scheduled for hysterectomy but this has been canceled in the setting of her inflammatory issue   lost her mother in February 2015.  History of gout in her wrists. Improvement with colchicine. Indomethacin upset her stomach  PMH:   has a past medical history of Adenomyosis, Anxiety, Arthritis, Asthma, Bell's palsy, Cervicalgia, Chest pain, atypical, DVT (deep vein thrombosis) in pregnancy, Dysrhythmia, Esophageal stricture, Esophagitis, Facial paralysis/Bells palsy (10/29/2014), Family history of breast cancer, Family history of colon cancer, Family history of Lynch syndrome, Family history of stomach cancer, FHx: ovarian cancer, Fibroids, Fistula, labyrinthine (1994), Gastroesophageal reflux disease, Generalized tonic-clonic seizure ([place]) (2002), 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 (2000), Gout, Headache, Heart disease, 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  PSH:    Past Surgical History:  Procedure Laterality Date   BREAST BIOPSY Right 2009   lymphoid and fibroadipose tissue   COLONOSCOPY  08-2014   DILATION AND CURETTAGE OF UTERUS     ESOPHAGOGASTRODUODENOSCOPY     HYSTEROSCOPY WITH D & C  01/20/2015   Procedure: DILATATION AND CURETTAGE /HYSTEROSCOPY;  Surgeon: Brayton Mars, MD;  Location: [facility] ORS;  Service: Gynecology;;   [facility] EAR SURGERY     x 4   LAPAROSCOPY  01/20/2015   Procedure: LAPAROSCOPY DIAGNOSTIC;  Surgeon: Brayton Mars, MD;  Location: [facility] ORS;  Service: Gynecology;;   NASAL SEPTUM SURGERY     PATENT FORAMEN OVALE CLOSURE  April 2008   TMJ x 2     uterine ablation  March 2008    Current Outpatient Medications  Medication Sig Dispense Refill   acetaminophen (TYLENOL) 500 MG tablet Take 500 mg by mouth daily as needed for pain.     albuterol (VENTOLIN HFA) 108 (90 Base) MCG/ACT inhaler Inhale 2 puffs into the lungs every 6 (six) hours as needed for wheezing or shortness of breath. 1 each 1   aspirin EC 81 MG tablet Take 81 mg by mouth daily.     calcium-vitamin D 250-100 MG-UNIT tablet Take 1 tablet by mouth 2 (two) times daily.     chlorhexidine (PERIDEX) 0.12 % solution GARGLE AND SPIT 15 MLS IN THE MOUTH OR THROAT PRN. 473 mL 0   Echinacea 125 MG CAPS Take by mouth.     esomeprazole (NEXIUM) 40 MG capsule Take 1 capsule (40 mg total) by mouth daily at 12 noon. **PLEASE CALL OFFICE TO SCHEDULE APPOINTMENT 90 capsule 0   ezetimibe (ZETIA) 10 MG tablet Take 10 mg by mouth daily.     folic acid (FOLVITE) 1 MG tablet Take 1 tablet by mouth daily.     furosemide (LASIX) 20 MG tablet TAKE 1 TABLET 4 DAYS PER WEEK AS DIRECTED 90 tablet 0   hyoscyamine (LEVSIN SL) 0.125 MG SL tablet Place 1 tablet (0.125 mg total) under the tongue  every 4 (four) hours as needed for cramping. 60 tablet 3   ibuprofen (ADVIL,MOTRIN) 200 MG tablet Take by mouth as needed.     Iodoquinol-HC-Aloe Polysacch 1-2-1 % GEL Apply twice daily as needed to affected area 48 g 1   magnesium gluconate (MAGONATE) 500 MG tablet Take 500 mg by mouth 2 (two) times daily.     methotrexate (RHEUMATREX) 2.5 MG tablet Take by mouth.     ondansetron (ZOFRAN ODT) 4 MG disintegrating tablet Take 1 tablet (4 mg total) by mouth every 8 (eight) hours as needed for nausea or vomiting. 20 tablet 0   potassium chloride (KLOR-CON) 10 MEQ tablet Take 1 tablet (10 mEq total) by mouth daily. Take 1 tablet daily, 4 times a week. Take with Lasix (may take extra tab with extra lasix) 90 tablet 3   promethazine (PHENERGAN) 12.5 MG tablet Take 1 tablet (12.5 mg total) by mouth every 8 (eight) hours as needed for nausea or vomiting. 20 tablet 0   rosuvastatin (CRESTOR) 5 MG tablet Take 1 tablet (5 mg total) by mouth daily. 90 tablet 1   sotalol (BETAPACE) 80 MG tablet TAKE 1 TABLET (80 MG TOTAL)  BY MOUTH 2 (TWO) TIMES DAILY. 180 tablet 2   sulfamethoxazole-trimethoprim (BACTRIM DS) 800-160 MG tablet Take 1 tablet by mouth 2 (two) times daily. 14 tablet 1   triamterene-hydrochlorothiazide (DYAZIDE) 37.5-25 MG capsule TAKE 1 CAPSULE BY MOUTH EVERY DAY 90 capsule 2   valACYclovir (VALTREX) 1000 MG tablet TAKE 1 TABLET BY MOUTH DAILY FOR SUPPRESSION , OR 5 TIMES DAILY FOR FLARE 35 tablet 2   vitamin C (ASCORBIC ACID) 500 MG tablet Take 500 mg by mouth daily.     zinc gluconate 50 MG tablet Take 50 mg by mouth daily.     predniSONE (DELTASONE) 5 MG tablet Take 1 tablet by mouth daily. (Patient not taking: No sig reported)     sucralfate (CARAFATE) 1 g tablet Take 1 tablet (1 g total) by mouth 4 (four) times daily -  with meals and at bedtime. (Patient not taking: No sig reported) 360 tablet 0   No current facility-administered medications for this visit.     Allergies:   2,4-d  dimethylamine (amisol); Nitrofurantoin monohyd macro; Other; Ciprofloxacin; Codeine; Erythromycin base; Hydrocodone-acetaminophen; Morphine; Nitrofurantoin; Pamelor [nortriptyline hcl]; Stadol [butorphanol]; Talwin [pentazocine]; Topamax [topiramate]; Wellbutrin [bupropion]; Zanaflex [tizanidine hcl]; Lamictal [lamotrigine]; and Macrobid [nitrofurantoin macrocrystal]   Social History:  The patient  reports that she has never smoked. She has never used smokeless tobacco. She reports that she does not drink alcohol and does not use drugs.   Family History:   family history includes Breast cancer in her paternal aunt and paternal aunt; Cancer (age of onset: 4) in her mother; Colon cancer (age of onset: 87) in her sister; Heart failure in her father; Hyperlipidemia in her mother; Hypertension in her mother; Hypothyroidism in her mother; Kidney disease in her mother; Ovarian cancer in an other family member; Stomach cancer in her maternal grandmother.    Review of Systems: Review of Systems  Constitutional: Negative.   HENT: Negative.    Respiratory: Negative.    Cardiovascular:  Positive for chest pain.  Gastrointestinal: Negative.   Musculoskeletal: Negative.   Neurological: Negative.   Psychiatric/Behavioral: Negative.    All other systems reviewed and are negative.  PHYSICAL EXAM: VS:  BP 124/70 (BP Location: Left Arm, Patient Position: Sitting, Cuff Size: Normal)   Pulse 65   Ht '5\' 5"'$  (1.651 m)   Wt 233 lb (105.7 kg)   SpO2 98%   BMI 38.77 kg/m  , BMI Body mass index is 38.77 kg/m. Constitutional:  oriented to person, place, and time. No distress.  Obese HENT:  Head: Grossly normal Eyes:  no discharge. No scleral icterus.  Neck: No JVD, no carotid bruits  Cardiovascular: Regular rate and rhythm, no murmurs appreciated Pulmonary/Chest: Clear to auscultation bilaterally, no wheezes or rails Abdominal: Soft.  no distension.  no tenderness.  Musculoskeletal: Normal range of  motion Neurological:  normal muscle tone. Coordination normal. No atrophy Skin: Skin warm and dry Psychiatric: normal affect, pleasant  Recent Labs: 03/20/2020: TSH 2.745 05/14/2020: Magnesium 2.2 08/18/2020: Hemoglobin 13.3; Platelets 286 02/06/2021: ALT 16; BUN 17; Creat 0.83; Potassium 4.0; Sodium 139    Lipid Panel Lab Results  Component Value Date   CHOL 237 (H) 02/16/2021   HDL 45 02/16/2021   LDLCALC 159 (H) 02/16/2021   TRIG 164 (H) 02/16/2021      Wt Readings from Last 3 Encounters:  02/17/21 233 lb (105.7 kg)  02/06/21 231 lb 6.4 oz (105 kg)  09/30/20 227 lb 12.8 oz (103.3 kg)  ASSESSMENT AND PLAN:  SVT/ PSVT/ PAT - Plan: EKG 12-Lead on sotalol 80 mg twice daily No significant breakthrough arrhythmia  Chest pains Concerning for angina Also shortness of breath, etiology unclear Long history of hyperlipidemia Recent diagnosis of rheumatoid arthritis Discussed various treatment options, Myoview not a good option given body habitus and high risk of breast attenuation artifact Cardiac CTA recommended  Mixed hyperlipidemia - Plan: EKG 12-Lead On Zetia, nervous to try Crestor  Rheumatoid arthritis Recent diagnosis, CRP elevated, finished prednisone taper  Obesity (BMI 30-39.9) - Plan: EKG 12-Lead Recommend walking program  Bilateral lower extremity edema - Plan: EKG 12-Lead Stable symptoms  Leg pain In the past we have avoided statins, cardiac CTA for further risk stratification as above  Prolonged QT interval QTc is stable    Total encounter time more than 25 minutes  Greater than 50% was spent in counseling and coordination of care with the patient    No orders of the defined types were placed in this encounter.    Signed, Esmond Plants, M.D., Ph.D. 02/17/2021  [place], [place]

## 2021-02-17 ENCOUNTER — Encounter: Payer: Self-pay | Admitting: Cardiovascular Disease

## 2021-02-17 ENCOUNTER — Other Ambulatory Visit: Payer: Self-pay

## 2021-02-17 ENCOUNTER — Ambulatory Visit: Payer: Medicare HMO | Admitting: Cardiovascular Disease

## 2021-02-17 VITALS — BP 124/70 | HR 65 | Ht 65.0 in | Wt 233.0 lb

## 2021-02-17 DIAGNOSIS — I5189 Other ill-defined heart diseases: Secondary | ICD-10-CM

## 2021-02-17 DIAGNOSIS — E785 Hyperlipidemia, unspecified: Secondary | ICD-10-CM | POA: Diagnosis not present

## 2021-02-17 DIAGNOSIS — Q211 Atrial septal defect: Secondary | ICD-10-CM

## 2021-02-17 DIAGNOSIS — R600 Localized edema: Secondary | ICD-10-CM

## 2021-02-17 DIAGNOSIS — R079 Chest pain, unspecified: Secondary | ICD-10-CM

## 2021-02-17 DIAGNOSIS — R002 Palpitations: Secondary | ICD-10-CM | POA: Diagnosis not present

## 2021-02-17 DIAGNOSIS — I471 Supraventricular tachycardia: Secondary | ICD-10-CM | POA: Diagnosis not present

## 2021-02-17 DIAGNOSIS — I209 Angina pectoris, unspecified: Secondary | ICD-10-CM

## 2021-02-17 DIAGNOSIS — Q2111 Secundum atrial septal defect: Secondary | ICD-10-CM

## 2021-02-17 DIAGNOSIS — R0602 Shortness of breath: Secondary | ICD-10-CM

## 2021-02-17 MED ORDER — METOPROLOL TARTRATE 50 MG PO TABS: 50.0000 mg | ORAL_TABLET | Freq: Once | ORAL | 0 refills | Status: DC

## 2021-02-17 NOTE — Patient Instructions (Addendum)
Medication Instructions:  No changes  If you need a refill on your cardiac medications before your next appointment, please call your pharmacy.   Lab work: None needed, CMP 9/2   Testing/Procedures: Cardiac CTA  Follow-Up: At [facility], you and your health needs are our priority.  As part of our continuing mission to provide you with exceptional heart care, we have created designated Provider Care Teams.  These Care Teams include your primary Cardiologist (physician) and Advanced Practice Providers (APPs -  Physician Assistants and Nurse Practitioners) who all work together to provide you with the care you need, when you need it.  You will need a follow up appointment in 12 months  Providers on your designated Care Team:   Murray Hodgkins, NP Christell Faith, PA-C Marrianne Mood, PA-C Cadence Sunshine, [place]  COVID-19 Vaccine Information can be found at: ShippingScam.co.uk For questions related to vaccine distribution or appointments, please email [email or web address] or call [phone].    Cardiac (coronary) CTA   [facility] [address] [name], [name] [address] [phone]  Date:  Time:     Tracey Wiley will call with date and time Please arrive 15 mins early for check-in and test prep.  Please follow these instructions carefully:  On the Night Before the Test: Be sure to Drink plenty of water. Do not consume any caffeinated/decaffeinated beverages or chocolate 12 hours prior to your test. This includes decaf as well Do not take any antihistamines 12 hours prior to your test. Such as Benadryl  On the Day of the Test: Drink plenty of water until 1 hour prior to the test. Do not eat any food 4 hours prior to the test. You may take your regular medications prior to the test.  Take metoprolol (Lopressor) two hours prior to test. Lopressor 50 mg This was sent in  to your pharmacy for pick-up HOLD Furosemide/Hydrochlorothiazide morning of the test. No fluid pills FEMALES- please wear underwire-free bra if available      After the Test: Drink plenty of water. After receiving IV contrast, you may experience a mild flushed feeling. This is normal. On occasion, you may experience a mild rash up to 24 hours after the test. This is not dangerous. If this occurs, you can take Benadryl 25 mg and increase your fluid intake (to flush out the kidneys) If you experience trouble breathing, this can be serious. If it is severe call 911 IMMEDIATELY. If it is mild, please call our office. If you take any of these medications:  Glipizide/Metformin Avandament,  Glucavance,  please do not take 48 hours after completing test unless otherwise instructed.  Once we have confirmed authorization from your insurance company, we will call you to set up a date and time for your test. Based on how quickly your insurance processes prior authorizations requests, please allow up to 4 weeks to be contacted for scheduling your Cardiac CT appointment. Be advised that routine Cardiac CT appointments could be scheduled as many as 8 weeks after your provider has ordered it.   For scheduling needs, including cancellations and rescheduling, please call Tracey Wiley, [phone].

## 2021-02-18 ENCOUNTER — Other Ambulatory Visit: Payer: Self-pay | Admitting: Cardiovascular Disease

## 2021-02-18 DIAGNOSIS — I5189 Other ill-defined heart diseases: Secondary | ICD-10-CM

## 2021-02-19 ENCOUNTER — Other Ambulatory Visit: Payer: Self-pay | Admitting: Internal Medicine

## 2021-02-19 NOTE — Telephone Encounter (Signed)
I have left messages on both her #'s to call me. Looks like in the chart she is not taking the Carafate. This may have been a automatic refill request.

## 2021-02-19 NOTE — Telephone Encounter (Signed)
Left another voicemail message to please call me back.

## 2021-02-20 ENCOUNTER — Telehealth: Payer: Self-pay | Admitting: Internal Medicine

## 2021-02-20 NOTE — Telephone Encounter (Signed)
Inbound call from patient. States she was returning call. States she does not want carafate medication and not sure why [facility] is refilling it.

## 2021-02-20 NOTE — Telephone Encounter (Signed)
I denied the medication refill as Audine didn't ask for it.

## 2021-02-27 ENCOUNTER — Other Ambulatory Visit: Payer: Self-pay | Admitting: Internal Medicine

## 2021-02-27 ENCOUNTER — Telehealth: Payer: Self-pay | Admitting: Internal Medicine

## 2021-02-27 MED ORDER — AMOXICILLIN 500 MG PO CAPS: 2000.0000 mg | ORAL_CAPSULE | Freq: Once | ORAL | 0 refills | Status: DC

## 2021-02-27 NOTE — Telephone Encounter (Signed)
Patient is requesting a refill on her amoxicillin for a dental procedure on Monday morning. She is sorry for short notice she just got the appointment.

## 2021-03-05 ENCOUNTER — Ambulatory Visit (INDEPENDENT_AMBULATORY_CARE_PROVIDER_SITE_OTHER): Payer: Medicare HMO

## 2021-03-05 VITALS — Ht 65.0 in | Wt 233.0 lb

## 2021-03-05 DIAGNOSIS — Z0000 Encounter for general adult medical examination without abnormal findings: Secondary | ICD-10-CM
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
## 2021-03-05 NOTE — Patient Instructions (Addendum)
  Tracey Wiley , Thank you for taking time to come for your Medicare Wellness Visit. I appreciate your ongoing commitment to your health goals. Please review the following plan we discussed and let me know if I can assist you in the future.   These are the goals we discussed:  Goals       Patient Stated     Maintain Healthy Lifestyle (pt-stated)      Stay active  Healthy diet         This is a list of the screening recommended for you and due dates:  Health Maintenance  Topic Date Due   HIV Screening  Never done   Hepatitis C Screening: USPSTF Recommendation to screen - Ages 93-79 yo.  Never done   Colon Cancer Screening  04/25/2020   COVID-19 Vaccine (4 - Booster) 03/21/2021*   Zoster (Shingles) Vaccine (1 of 2) 05/08/2021*   Flu Shot  09/04/2021*   Tetanus Vaccine  03/05/2022*   Pap Smear  01/24/2022   Mammogram  07/08/2022   HPV Vaccine  Aged Out  *Topic was postponed. The date shown is not the original due date.

## 2021-03-16 ENCOUNTER — Other Ambulatory Visit (HOSPITAL_COMMUNITY): Payer: Self-pay | Admitting: *Deleted

## 2021-03-16 ENCOUNTER — Telehealth: Payer: Self-pay | Admitting: Internal Medicine

## 2021-03-16 DIAGNOSIS — Z01812 Encounter for preprocedural laboratory examination: Secondary | ICD-10-CM

## 2021-03-16 DIAGNOSIS — M0600 Rheumatoid arthritis without rheumatoid factor, unspecified site: Secondary | ICD-10-CM

## 2021-03-16 NOTE — Telephone Encounter (Signed)
Yes, I 'm sorry that happened.  Given her experience I recommend going to [place] or [facility] or [facility] for rheumatology follow up.  Does she have a preference which direction

## 2021-03-16 NOTE — Telephone Encounter (Signed)
Patient is calling in to see if Dr.Tullo can set her up with another rheumatologist due to a bad experience with [facility].She saw Dr.Patel at [facility] and during her appointment he acted uninterested in her concerns.She stated that he yawned while she was talking with him,was not willing to answer her questions and did not want to explain the side effects of the chemotherapy pills prescribed to her that she would like to have more information before taking them.She is upset about the situation and not feeling well due to this visit.She also stated that she left her visit in tears due to the way he treated her.Please call her at [phone].

## 2021-03-17 ENCOUNTER — Other Ambulatory Visit (HOSPITAL_COMMUNITY): Payer: Self-pay | Admitting: *Deleted

## 2021-03-17 ENCOUNTER — Telehealth (HOSPITAL_COMMUNITY): Payer: Self-pay | Admitting: *Deleted

## 2021-03-17 DIAGNOSIS — Z01812 Encounter for preprocedural laboratory examination: Secondary | ICD-10-CM

## 2021-03-17 NOTE — Addendum Note (Signed)
Addended by: Crecencio Mc on: 03/17/2021 10:50 AM   Modules accepted: Orders

## 2021-03-17 NOTE — Telephone Encounter (Signed)
[facility] referral in progress

## 2021-03-17 NOTE — Telephone Encounter (Signed)
Reaching out to patient to offer assistance regarding upcoming cardiac imaging study; pt verbalizes understanding of appt date/time, parking situation and where to check in, pre-test NPO status and medications ordered, and verified current allergies; name and call back number provided for further questions should they arise  Gordy Clement RN Navigator Cardiac Imaging [facility] [phone] office [phone] cell  Patient to take 50mg  metoprolol tartrate two hours prior to CT scan.  She will get blood work prior to cardiac CT appointment.

## 2021-03-17 NOTE — Telephone Encounter (Signed)
Patient stated she would like to go to [facility] due to being a lot closer.

## 2021-03-17 NOTE — Telephone Encounter (Signed)
Left message for patient to return call back.  

## 2021-03-18 ENCOUNTER — Other Ambulatory Visit
Admission: RE | Admit: 2021-03-18 | Discharge: 2021-03-18 | Disposition: A | Discharge status: Home / Self Care | Payer: Medicare HMO | Source: Ambulatory Visit | Attending: Cardiovascular Disease | Admitting: Cardiovascular Disease

## 2021-03-18 ENCOUNTER — Other Ambulatory Visit (HOSPITAL_COMMUNITY): Payer: Self-pay | Admitting: *Deleted

## 2021-03-18 DIAGNOSIS — Z01812 Encounter for preprocedural laboratory examination: Secondary | ICD-10-CM

## 2021-03-18 DIAGNOSIS — I5189 Other ill-defined heart diseases: Secondary | ICD-10-CM

## 2021-03-18 DIAGNOSIS — Z79899 Other long term (current) drug therapy: Secondary | ICD-10-CM

## 2021-03-18 LAB — BASIC METABOLIC PANEL
Anion gap: 9 (ref 5–15)
BUN: 16 mg/dL (ref 8–23)
CO2: 29 mmol/L (ref 22–32)
Calcium: 9.1 mg/dL (ref 8.9–10.3)
Chloride: 96 mmol/L — ABNORMAL LOW (ref 98–111)
Creatinine, Ser: 0.69 mg/dL (ref 0.44–1.00)
GFR, Estimated: >60 mL/min (ref >60)
Glucose, Bld: 152 mg/dL — ABNORMAL HIGH (ref 70–99)
Potassium: 3.6 mmol/L (ref 3.5–5.1)
Sodium: 134 mmol/L — ABNORMAL LOW (ref 135–145)

## 2021-03-18 NOTE — Addendum Note (Signed)
Addended by: Eli Hose on: 03/18/2021 09:49 AM   Modules accepted: Orders

## 2021-03-19 ENCOUNTER — Other Ambulatory Visit: Payer: Self-pay

## 2021-03-19 ENCOUNTER — Ambulatory Visit
Admission: RE | Admit: 2021-03-19 | Discharge: 2021-03-19 | Disposition: A | Discharge status: Home / Self Care | Payer: Medicare HMO | Source: Ambulatory Visit | Attending: Cardiovascular Disease | Admitting: Cardiovascular Disease

## 2021-03-19 DIAGNOSIS — I209 Angina pectoris, unspecified: Secondary | ICD-10-CM

## 2021-03-19 DIAGNOSIS — R0602 Shortness of breath: Secondary | ICD-10-CM | POA: Insufficient documentation

## 2021-03-19 DIAGNOSIS — E785 Hyperlipidemia, unspecified: Secondary | ICD-10-CM | POA: Insufficient documentation

## 2021-03-19 DIAGNOSIS — R079 Chest pain, unspecified: Secondary | ICD-10-CM | POA: Insufficient documentation

## 2021-03-19 MED ORDER — NITROGLYCERIN 0.4 MG SL SUBL
0.8000 mg | SUBLINGUAL_TABLET | Freq: Once | SUBLINGUAL | Status: AC
  Administered 2021-03-19: 0.8 mg via SUBLINGUAL

## 2021-03-19 MED ORDER — IOHEXOL 350 MG/ML SOLN
100.0000 mL | Freq: Once | INTRAVENOUS | Status: AC | PRN
  Administered 2021-03-19: 100 mL via INTRAVENOUS

## 2021-03-19 MED ORDER — METOPROLOL TARTRATE 5 MG/5ML IV SOLN
10.0000 mg | Freq: Once | INTRAVENOUS | Status: AC
  Administered 2021-03-19: 10 mg via INTRAVENOUS

## 2021-03-19 NOTE — Progress Notes (Signed)
Patient tolerated CT well. Drank water after. Vital signs stable encourage to drink water throughout day.Reasons explained and verbalized understanding. Ambulated steady gait.  

## 2021-03-22 ENCOUNTER — Other Ambulatory Visit: Payer: Self-pay | Admitting: Physician Assistant

## 2021-03-23 ENCOUNTER — Telehealth: Payer: Self-pay

## 2021-03-23 NOTE — Telephone Encounter (Signed)
Left detail message on VM of pt's recent results okay by DPR, Dr. Rockey Situ advised   "Cardiac CTA  Coronary calcium score 0, no coronary blockages  Excellent study! "  At this time, no further recommendations or medications changes, advised to call office for any concerns or questions, otherwise will see at next visit.

## 2021-03-26 ENCOUNTER — Telehealth: Payer: Self-pay | Admitting: Internal Medicine

## 2021-03-26 DIAGNOSIS — M0600 Rheumatoid arthritis without rheumatoid factor, unspecified site: Secondary | ICD-10-CM

## 2021-03-26 NOTE — Telephone Encounter (Signed)
Patient has an appointment with Rheumatology on 10/20/2021 in [place]. Patient was wondering if she could get into [facility].

## 2021-03-27 ENCOUNTER — Telehealth: Payer: Self-pay | Admitting: Internal Medicine

## 2021-03-27 NOTE — Telephone Encounter (Signed)
Lft pt a vm to call regarding rheumatology referral. thanks

## 2021-03-30 NOTE — Telephone Encounter (Signed)
Patient is returning your call from earlier.Please call her at [phone] stated she is not available before 10 am.

## 2021-03-31 NOTE — Telephone Encounter (Signed)
Referral to [facility] made

## 2021-04-02 ENCOUNTER — Other Ambulatory Visit: Payer: Self-pay

## 2021-04-02 ENCOUNTER — Encounter: Payer: Self-pay | Admitting: Internal Medicine

## 2021-04-02 ENCOUNTER — Ambulatory Visit (INDEPENDENT_AMBULATORY_CARE_PROVIDER_SITE_OTHER): Payer: Medicare HMO | Admitting: Internal Medicine

## 2021-04-02 VITALS — BP 110/64 | HR 67 | Temp 98.0°F | Ht 65.0 in | Wt 237.4 lb

## 2021-04-02 DIAGNOSIS — Z79899 Other long term (current) drug therapy: Secondary | ICD-10-CM

## 2021-04-02 DIAGNOSIS — L538 Other specified erythematous conditions: Secondary | ICD-10-CM | POA: Diagnosis not present

## 2021-04-02 DIAGNOSIS — M0600 Rheumatoid arthritis without rheumatoid factor, unspecified site: Secondary | ICD-10-CM

## 2021-04-02 DIAGNOSIS — E782 Mixed hyperlipidemia: Secondary | ICD-10-CM

## 2021-04-02 MED ORDER — PREDNISONE 10 MG PO TABS: ORAL_TABLET | ORAL | 0 refills | Status: DC

## 2021-04-02 MED ORDER — TRAMADOL HCL 50 MG PO TABS: 50.0000 mg | ORAL_TABLET | Freq: Four times a day (QID) | ORAL | 0 refills | Status: AC | PRN

## 2021-04-02 NOTE — Patient Instructions (Addendum)
If you have a flare of RA ,  try the tramadol for pain control.  It can be added to motrin and tylenol  If the flare is prolonged  I have placed a prednisone taper on file for  you at your pharmacy   Repeat labs (nonfasting) 4 weeks after starting MTX   Postpone shingrix and TdaP until after Jan 1.  Medicare will pay for it after that if you have it done at your pharmacy   Ask Dr Ronnald Ramp FOR YOUR immunization records

## 2021-04-02 NOTE — Assessment & Plan Note (Addendum)
Agree with Dr Rockey Situ. Statin therapy not indicated based on results of coronary CT scan with calcium score of zero. Ok to continue zetia if tolerating,  Which she is thus far  But reassured her that it is ok to stop if not tolerating

## 2021-04-02 NOTE — Progress Notes (Signed)
Subjective:  Patient ID: Tracey Wiley, female    DOB: Nov 09, 1955  Age: 65 y.o. MRN: [id number]  CC: The primary encounter diagnosis was Long-term use of high-risk medication. Diagnoses of Mixed hyperlipidemia, Rheumatoid arthritis with negative rheumatoid factor, involving unspecified site ([place]), and Macular erythematous rash were also pertinent to this visit.  HPI [name] presents for   follow up Chief Complaint  Patient presents with   Follow-up   1) needs refill on a compounded cream originally prescribed by Nehemiah Massed  for recurrent intertrigonal rash that has burned and become irritated .  The rash  resolved in 12 hours  after use of the cream;  it contained antibacterial antifungal and steroid  combo but the cost is $7000  2) finished prednisone sept 11  had a rheumatoid flare involving her hips Friday sat Sunday Monday  intense pain,  used  advil /tylenol   3) restarted zetia Sept 9 and tolerating it   4) received flu shot Oct 17th   5) getting COVID vaccine rix next Monday   6) overdue for tetanus: advised to postpone along with shingrix until Jan 1   7) had a panic attack during the cardiac CT .  Attributed it to the Metoprolol and NTG  and claustrophobia that was managed by the tech (greg) with reassurance.      Outpatient Medications Prior to Visit  Medication Sig Dispense Refill   acetaminophen (TYLENOL) 500 MG tablet Take 500 mg by mouth daily as needed for pain.     albuterol (VENTOLIN HFA) 108 (90 Base) MCG/ACT inhaler Inhale 2 puffs into the lungs every 6 (six) hours as needed for wheezing or shortness of breath. 1 each 1   aspirin EC 81 MG tablet Take 81 mg by mouth daily.     calcium-vitamin D 250-100 MG-UNIT tablet Take 1 tablet by mouth 2 (two) times daily.     chlorhexidine (PERIDEX) 0.12 % solution GARGLE AND SPIT 15 MLS IN THE MOUTH OR THROAT PRN. 473 mL 0   Echinacea 125 MG CAPS Take by mouth.     esomeprazole (NEXIUM) 40 MG capsule TAKE 1 CAPSULE  BY MOUTH DAILY AT 12 NOON. **PLEASE CALL OFFICE TO SCHEDULE APPOINTMENT** 90 capsule 0   ezetimibe (ZETIA) 10 MG tablet Take 10 mg by mouth daily.     furosemide (LASIX) 20 MG tablet TAKE 1 TABLET BY MOUTH 4 DAYS PER WEEK AS DIRECTED 48 tablet 3   hyoscyamine (LEVSIN SL) 0.125 MG SL tablet Place 1 tablet (0.125 mg total) under the tongue every 4 (four) hours as needed for cramping. 60 tablet 3   ibuprofen (ADVIL,MOTRIN) 200 MG tablet Take by mouth as needed.     magnesium gluconate (MAGONATE) 500 MG tablet Take 500 mg by mouth 2 (two) times daily.     potassium chloride (KLOR-CON) 10 MEQ tablet Take 1 tablet (10 mEq total) by mouth daily. Take 1 tablet daily, 4 times a week. Take with Lasix (may take extra tab with extra lasix) 90 tablet 3   sotalol (BETAPACE) 80 MG tablet TAKE 1 TABLET (80 MG TOTAL) BY MOUTH 2 (TWO) TIMES DAILY. 180 tablet 2   sulfamethoxazole-trimethoprim (BACTRIM DS) 800-160 MG tablet Take 1 tablet by mouth 2 (two) times daily. 14 tablet 1   triamterene-hydrochlorothiazide (DYAZIDE) 37.5-25 MG capsule TAKE 1 CAPSULE BY MOUTH EVERY DAY 90 capsule 2   valACYclovir (VALTREX) 1000 MG tablet TAKE 1 TABLET BY MOUTH DAILY FOR SUPPRESSION , OR 5 TIMES DAILY  FOR FLARE 35 tablet 2   vitamin C (ASCORBIC ACID) 500 MG tablet Take 500 mg by mouth daily.     zinc gluconate 50 MG tablet Take 50 mg by mouth daily.     folic acid (FOLVITE) 1 MG tablet Take 1 tablet by mouth daily. (Patient not taking: Reported on 04/02/2021)     methotrexate (RHEUMATREX) 2.5 MG tablet Take by mouth. (Patient not taking: Reported on 04/02/2021)     ondansetron (ZOFRAN ODT) 4 MG disintegrating tablet Take 1 tablet (4 mg total) by mouth every 8 (eight) hours as needed for nausea or vomiting. (Patient not taking: Reported on 04/02/2021) 20 tablet 0   promethazine (PHENERGAN) 12.5 MG tablet Take 1 tablet (12.5 mg total) by mouth every 8 (eight) hours as needed for nausea or vomiting. (Patient not taking: Reported on  04/02/2021) 20 tablet 0   sucralfate (CARAFATE) 1 g tablet Take 1 tablet (1 g total) by mouth 4 (four) times daily -  with meals and at bedtime. (Patient not taking: No sig reported) 360 tablet 0   Iodoquinol-HC-Aloe Polysacch 1-2-1 % GEL Apply twice daily as needed to affected area (Patient not taking: Reported on 04/02/2021) 48 g 1   metoprolol tartrate (LOPRESSOR) 50 MG tablet Take 1 tablet (50 mg total) by mouth once for 1 dose. Take 2 hours before your CCTA procedure 1 tablet 0   rosuvastatin (CRESTOR) 5 MG tablet Take 1 tablet (5 mg total) by mouth daily. 90 tablet 1   No facility-administered medications prior to visit.    Review of Systems;  Patient denies headache, fevers, malaise, unintentional weight loss, skin rash, eye pain, sinus congestion and sinus pain, sore throat, dysphagia,  hemoptysis , cough, dyspnea, wheezing, chest pain, palpitations, orthopnea, edema, abdominal pain, nausea, melena, diarrhea, constipation, flank pain, dysuria, hematuria, urinary  Frequency, nocturia, numbness, tingling, seizures,  Focal weakness, Loss of consciousness,  Tremor, insomnia, depression, anxiety, and suicidal ideation.      Objective:  BP 110/64 (BP Location: Left Arm, Patient Position: Sitting, Cuff Size: Large)   Pulse 67   Temp 98 F (36.7 C) (Oral)   Ht 5\' 5"  (1.651 m)   Wt 237 lb 6.4 oz (107.7 kg)   SpO2 97%   BMI 39.51 kg/m   BP Readings from Last 3 Encounters:  04/02/21 110/64  03/19/21 139/81  02/17/21 124/70    Wt Readings from Last 3 Encounters:  04/02/21 237 lb 6.4 oz (107.7 kg)  03/05/21 233 lb (105.7 kg)  02/17/21 233 lb (105.7 kg)    General appearance: alert, cooperative and appears stated age Ears: normal TM's and external ear canals both ears Throat: lips, mucosa, and tongue normal; teeth and gums normal Neck: no adenopathy, no carotid bruit, supple, symmetrical, trachea midline and thyroid not enlarged, symmetric, no tenderness/mass/nodules Back:  symmetric, no curvature. ROM normal. No CVA tenderness. Lungs: clear to auscultation bilaterally Heart: regular rate and rhythm, S1, S2 normal, no murmur, click, rub or gallop Abdomen: soft, non-tender; bowel sounds normal; no masses,  no organomegaly Pulses: 2+ and symmetric Skin: Skin color, texture, turgor normal. No rashes or lesions Lymph nodes: Cervical, supraclavicular, and axillary nodes normal.  Lab Results  Component Value Date   HGBA1C 6.0 (H) 08/18/2020   HGBA1C 6.2 (H) 03/20/2020   HGBA1C 6.2 01/23/2019    Lab Results  Component Value Date   CREATININE 0.69 03/18/2021   CREATININE 0.83 02/06/2021   CREATININE 0.77 08/18/2020    Lab Results  Component Value  Date   WBC 8.6 08/18/2020   HGB 13.3 08/18/2020   HCT 40.0 08/18/2020   PLT 286 08/18/2020   GLUCOSE 152 (H) 03/18/2021   CHOL 237 (H) 02/16/2021   TRIG 164 (H) 02/16/2021   HDL 45 02/16/2021   LDLDIRECT 136.0 01/23/2019   LDLCALC 159 (H) 02/16/2021   ALT 16 02/06/2021   AST 20 02/06/2021   NA 134 (L) 03/18/2021   K 3.6 03/18/2021   CL 96 (L) 03/18/2021   CREATININE 0.69 03/18/2021   BUN 16 03/18/2021   CO2 29 03/18/2021   TSH 2.745 03/20/2020   HGBA1C 6.0 (H) 08/18/2020    CT CORONARY MORPH W/CTA COR W/SCORE W/CA W/CM &/OR WO/CM  Addendum Date: 03/20/2021   ADDENDUM REPORT: 03/20/2021 08:35 EXAM: OVER-READ INTERPRETATION  CT CHEST The following report is an over-read performed by radiologist Dr. Estanislado Pandy [facility] on 03/20/2021. This over-read does not include interpretation of cardiac or coronary anatomy or pathology. The coronary CTA interpretation by the cardiologist is attached. COMPARISON:  03/12/2019 FINDINGS: Visualized mediastinal structures are normal. Images of the upper abdomen are unremarkable. Visualized lungs are clear. No pleural effusions. No acute bone abnormality. IMPRESSION: No acute extracardiac findings. Electronically Signed   By: Markus Daft M.D.   On: 03/20/2021  08:35   Result Date: 03/20/2021 CLINICAL DATA:  Chest pain EXAM: Cardiac/Coronary  CTA TECHNIQUE: The patient was scanned on a Siemens Somatom go.Top scanner. : A retrospective scan was triggered in the descending thoracic aorta. Axial non-contrast 3 mm slices were carried out through the heart. The data set was analyzed on a dedicated work station and scored using the Daph[place]. Gantry rotation speed was 330 msecs and collimation was .6 mm. 50mg  of metoprolol and 0.8 mg of sl NTG was given. The 3D data set was reconstructed in 5% intervals of the 60-95 % of the R-R cycle. Diastolic phases were analyzed on a dedicated work station using MPR, MIP and VRT modes. The patient received 100 cc of contrast. FINDINGS: Aorta: Normal size. Mild aortic root calcifications close to the ostial RCA. No dissection. Aortic Valve:  Trileaflet.  No calcifications. Coronary Arteries:  Normal coronary origin.  Right dominance. RCA is a dominant artery that gives rise to PDA and PLA. There is no plaque. Left main is a large artery that gives rise to LAD and LCX arteries. LAD no plaque. LCX is a non-dominant artery that gives rise to two obtuse marginal branches. There is no plaque. Other findings: Normal pulmonary vein drainage into the left atrium. Normal left atrial appendage without a thrombus. Normal size of the pulmonary artery. Closure device noted in the atrial septum. No evidence for leak or atrial septal defect. IMPRESSION: 1. Coronary calcium score of 0. Patient is low risk for coronary events. 2. Normal coronary origin with right dominance. 3. No evidence of CAD. 4. Closure device noted in the atrial septum. No evidence for leak or atrial septal defect. 5. CAD-RADS 0. No evidence of CAD (0%). Consider non-atherosclerotic causes of chest pain. Electronically Signed: By: Kate Sable M.D. On: 03/19/2021 16:46    Assessment & Plan:   Problem List Items Addressed This Visit     Hyperlipidemia    Agree with  Dr Rockey Situ. Statin therapy not indicated based on results of coronary CT scan with calcium score of zero. Ok to continue zetia if tolerating,  Which she is thus far  But reassured her that it is ok to stop if not tolerating  Macular erythematous rash    Refilling iodoquinol HC-aloe cream       Rheumatoid arthritis (HCC)    Currently managed with prn prednisone s[ending start of MTX.  She will start soon and return for cbc/cmet after 3 weeks of therapy       Relevant Medications   traMADol (ULTRAM) 50 MG tablet   predniSONE (DELTASONE) 10 MG tablet   Other Visit Diagnoses     Long-term use of high-risk medication    -  Primary   Relevant Orders   Comprehensive metabolic panel   CBC with Differential/Platelet   C-reactive protein       I provided  30 minutes  during this encounter reviewing patient's current problems and past surgeries, labs and imaging studies, providing counseling on the above mentioned problems I na face to face visit  , and coordination  of care .  Meds ordered this encounter  Medications   traMADol (ULTRAM) 50 MG tablet    Sig: Take 1 tablet (50 mg total) by mouth every 6 (six) hours as needed for up to 7 days.    Dispense:  30 tablet    Refill:  0   predniSONE (DELTASONE) 10 MG tablet    Sig: 6 tablets daily for 3 days, then reduce by 1 tablet daily until gone    Dispense:  33 tablet    Refill:  0    KEEP ON FILE FOR FUTURE REFILLS   Iodoquinol-HC-Aloe Polysacch 1-2-1 % GEL    Sig: Apply twice daily as needed to affected area    Dispense:  48 g    Refill:  1   Iodoquinol-Hydrocortisone-Aloe 1-1.9 % CREA    Sig: Apply as needed to irritated area    Dispense:  2 g    Refill:  11    Medications Discontinued During This Encounter  Medication Reason   rosuvastatin (CRESTOR) 5 MG tablet No longer needed (for PRN medications)   metoprolol tartrate (LOPRESSOR) 50 MG tablet No longer needed (for PRN medications)   Iodoquinol-HC-Aloe Polysacch 1-2-1  % GEL Reorder    Follow-up: Return in about 3 months (around 07/03/2021).   Crecencio Mc, MD

## 2021-04-03 ENCOUNTER — Ambulatory Visit: Payer: Medicare HMO | Admitting: Internal Medicine

## 2021-04-05 ENCOUNTER — Other Ambulatory Visit: Payer: Self-pay | Admitting: Internal Medicine

## 2021-04-05 DIAGNOSIS — L538 Other specified erythematous conditions: Secondary | ICD-10-CM | POA: Insufficient documentation

## 2021-04-05 MED ORDER — IODOQUINOL-HYDROCORTISONE-ALOE 1-1.9 % EX CREA: TOPICAL_CREAM | CUTANEOUS | 11 refills | Status: DC

## 2021-04-05 MED ORDER — IODOQUINOL-HC-ALOE POLYSACCH 1-2-1 % EX GEL: CUTANEOUS | 1 refills | Status: DC

## 2021-04-05 NOTE — Assessment & Plan Note (Signed)
Refilling iodoquinol HC-aloe cream

## 2021-04-05 NOTE — Assessment & Plan Note (Signed)
Currently managed with prn prednisone s[ending start of MTX.  She will start soon and return for cbc/cmet after 3 weeks of therapy

## 2021-04-12 ENCOUNTER — Other Ambulatory Visit: Payer: Self-pay | Admitting: Family

## 2021-04-12 DIAGNOSIS — E785 Hyperlipidemia, unspecified: Secondary | ICD-10-CM

## 2021-04-13 NOTE — Telephone Encounter (Signed)
Rx(s) sent to pharmacy electronically.  

## 2021-04-16 ENCOUNTER — Telehealth: Payer: Self-pay | Admitting: Internal Medicine

## 2021-04-16 ENCOUNTER — Other Ambulatory Visit: Payer: Self-pay

## 2021-04-16 ENCOUNTER — Encounter: Payer: Self-pay | Admitting: Internal Medicine

## 2021-04-16 ENCOUNTER — Telehealth (INDEPENDENT_AMBULATORY_CARE_PROVIDER_SITE_OTHER): Payer: Medicare HMO | Admitting: Internal Medicine

## 2021-04-16 VITALS — Ht 65.0 in | Wt 237.4 lb

## 2021-04-16 DIAGNOSIS — R519 Headache, unspecified: Secondary | ICD-10-CM

## 2021-04-16 DIAGNOSIS — M791 Myalgia, unspecified site: Secondary | ICD-10-CM

## 2021-04-16 DIAGNOSIS — R051 Acute cough: Secondary | ICD-10-CM

## 2021-04-16 DIAGNOSIS — J029 Acute pharyngitis, unspecified: Secondary | ICD-10-CM

## 2021-04-16 DIAGNOSIS — R0981 Nasal congestion: Secondary | ICD-10-CM

## 2021-04-16 DIAGNOSIS — J069 Acute upper respiratory infection, unspecified: Secondary | ICD-10-CM

## 2021-04-16 MED ORDER — AMOXICILLIN-POT CLAVULANATE 875-125 MG PO TABS: 1.0000 | ORAL_TABLET | Freq: Two times a day (BID) | ORAL | 0 refills | Status: DC
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
## 2021-04-16 NOTE — Telephone Encounter (Signed)
Pt has been scheduled for testing tomorrow at 11:30am Tracey Wiley will swab pt. Pt stated that she would get here as close to 11am as possible.

## 2021-04-16 NOTE — Progress Notes (Signed)
Patient onset of symptoms Tuesday night. Started with sore throat, right side of the throat was red. Patient then had nasal congestion, headache, dry cough, and muscle pain the next day. Patient now has a productive cough and runny nose with mucus the color of white, red and orange. Today the mucus is yellow.   No Covid test. Patient not around children and does not leave the house often.

## 2021-04-16 NOTE — Telephone Encounter (Signed)
Saw this sick visit patient  Coming in Friday 11-11:30 for flu/covid/strep testing  Please try to schedule this  Thank you

## 2021-04-16 NOTE — Patient Instructions (Signed)
These are over the counter medication options:  Mucinex dm green label for cough.  Multivitamin  cepacol or chloroseptic spray  Warm tea with honey and lemon  Hydration  Try to eat though you dont feel like it   Tylenol or Advil  2 sprays-Nasal saline followed by Flonase 2 sprays

## 2021-04-16 NOTE — Telephone Encounter (Signed)
Noted will swab Patient tomorrow

## 2021-04-17 ENCOUNTER — Other Ambulatory Visit: Payer: Medicare HMO

## 2021-04-17 ENCOUNTER — Telehealth: Payer: Medicare HMO | Admitting: Family Medicine

## 2021-04-17 DIAGNOSIS — J069 Acute upper respiratory infection, unspecified: Secondary | ICD-10-CM

## 2021-04-17 DIAGNOSIS — M791 Myalgia, unspecified site: Secondary | ICD-10-CM

## 2021-04-17 DIAGNOSIS — J029 Acute pharyngitis, unspecified: Secondary | ICD-10-CM

## 2021-04-17 DIAGNOSIS — R519 Headache, unspecified: Secondary | ICD-10-CM

## 2021-04-17 DIAGNOSIS — R051 Acute cough: Secondary | ICD-10-CM

## 2021-04-17 DIAGNOSIS — R0981 Nasal congestion: Secondary | ICD-10-CM

## 2021-04-17 LAB — POCT RAPID STREP A (OFFICE): Rapid Strep A Screen: NEGATIVE

## 2021-04-17 NOTE — Addendum Note (Signed)
Addended by: Thressa Sheller on: 04/17/2021 12:01 PM   Modules accepted: Orders

## 2021-04-19 LAB — COVID-19, FLU A+B NAA
Influenza A, NAA: NOT DETECTED
Influenza B, NAA: NOT DETECTED
SARS-CoV-2, NAA: NOT DETECTED

## 2021-04-20 ENCOUNTER — Other Ambulatory Visit: Payer: Self-pay | Admitting: Internal Medicine

## 2021-04-20 ENCOUNTER — Other Ambulatory Visit: Payer: Self-pay | Admitting: Cardiovascular Disease

## 2021-04-20 ENCOUNTER — Telehealth: Payer: Self-pay | Admitting: Internal Medicine

## 2021-04-20 DIAGNOSIS — J40 Bronchitis, not specified as acute or chronic: Secondary | ICD-10-CM

## 2021-04-20 MED ORDER — PREDNISONE 10 MG PO TABS: ORAL_TABLET | ORAL | 0 refills | Status: DC

## 2021-04-20 NOTE — Telephone Encounter (Signed)
Patient states that she has not taken Prednisone in months. States the dose given in October was an emergency dose for rheumatoid arthritis kept on file. States she has not taken this or had this filled.   Will go to pick up the prednisone sent in today by Dr Olivia Mackie McLean-Scocuzza

## 2021-04-20 NOTE — Telephone Encounter (Signed)
Per visit note with Dr Olivia Mackie McLean-Scocuzza:  "Upper respiratory tract infection, unspecified type - Plan: Coronavirus (COVID-19) with Influenza A and Influenza B, POCT rapid strep A, amoxicillin-clavulanate (AUGMENTIN) 875-125 MG tablet These are over the counter medication options:  Mucinex dm green label for cough.  Multivitamin  cepacol or chloroseptic spray  Warm tea with honey and lemon  Hydration  Try to eat though you dont feel like it   Tylenol or Advil  2 sprays-Nasal saline followed by Flonase 2 sprays" All testing for COVID/Strep/Flu were negative.   Patient states that she has this every year and prednisone usually helps. States the antibiotics and over the counter medications are not helping.   Please advise on sending in Prednisone or does Patient need to be seen and reevaluated?

## 2021-04-20 NOTE — Telephone Encounter (Signed)
The patient was informed by the provider on 04/16/21 that prednisone would be called to the pharmacy. However, when the patient called and spoke with the after hours nurse for 30 min. The nurse stated that she could not access her record to see the provider note and she should go to the ED or Urgent Care. The patient informed her that she needed prednisone and Tessalon pearls called to the pharmacy. The patient also stated that this happens every year and it does not show up again until the next year. The patient has someone that will go to the pharmacy today at 12 can medication be called into the pharmacy before 12.

## 2021-04-20 NOTE — Addendum Note (Signed)
Addended by: Elpidio Galea T on: 04/20/2021 01:11 PM   Modules accepted: Orders

## 2021-04-20 NOTE — Telephone Encounter (Signed)
She can stop steroids if better in 5-7 days

## 2021-04-20 NOTE — Telephone Encounter (Signed)
[facility] called and [facility] said they did not get medication.

## 2021-04-20 NOTE — Telephone Encounter (Signed)
She just had prednisone 04/02/21 with PCP and it is not good to keep re-using prednisone I If issues continue f/u with PCP and discuss further work up/treatment plans Sent prednisone again

## 2021-04-20 NOTE — Telephone Encounter (Signed)
Went over taper instructions with the Patient

## 2021-05-05 ENCOUNTER — Ambulatory Visit: Payer: Medicare HMO | Admitting: Internal Medicine

## 2021-05-15 ENCOUNTER — Other Ambulatory Visit: Payer: Medicare HMO

## 2021-05-20 ENCOUNTER — Telehealth: Payer: Self-pay | Admitting: Internal Medicine

## 2021-05-20 NOTE — Telephone Encounter (Signed)
Pt called in stating that Dr. Derrel Nip advise Pt if she have any side effect Pt is having after receiving the pneumonia injection. Pt called in stating that she had some side effects. Pt was experiencing dry cough that gets worst at night and wont go away and gout flare up twice. Gout flared up in left hand  and yesterday in right hand. Pt stated that she still have the dry cough right now. Pt requesting callback.

## 2021-05-22 ENCOUNTER — Other Ambulatory Visit: Payer: Self-pay

## 2021-05-22 MED ORDER — COLCHICINE 0.6 MG PO TABS: 0.6000 mg | ORAL_TABLET | Freq: Two times a day (BID) | ORAL | 1 refills | Status: DC | PRN

## 2021-05-22 NOTE — Telephone Encounter (Signed)
I called patient to let her know the colchicine has been sent [facility] for her.

## 2021-05-22 NOTE — Telephone Encounter (Signed)
Pt stated that she does not have any colchine & she wanted a script for it for her flare. She is afraid she will have one before she sees you.

## 2021-05-22 NOTE — Telephone Encounter (Signed)
Spoke with Tracey Wiley and she stated that she was to call and let us know if she had any side of effects from the Pneumonia vaccine she received on 05/03/2021 at the pharmacy. Tracey Wiley stated that a few days after getting the vaccine she developed a dry cough and then 6 days after the vaccine she had a gout flare. She took chlochine for the gout flare it went away and the dry cough went away while on the chlochine. She stated that the very next week she woke up with another gout flare in a different area, took the cholchine and it got better. Tracey Wiley is wanting to know what she should do over the holiday incase she has another flare. She stated that she has a prednisone taper on file at the pharmacy. Should she fill the rx to have on hand or use the chlochine?

## 2021-05-25 ENCOUNTER — Ambulatory Visit: Payer: Medicare HMO | Admitting: Internal Medicine

## 2021-05-26 ENCOUNTER — Other Ambulatory Visit: Payer: Medicare HMO

## 2021-05-30 ENCOUNTER — Other Ambulatory Visit: Payer: Self-pay | Admitting: Internal Medicine

## 2021-06-03 ENCOUNTER — Other Ambulatory Visit: Payer: Self-pay

## 2021-06-03 ENCOUNTER — Ambulatory Visit (INDEPENDENT_AMBULATORY_CARE_PROVIDER_SITE_OTHER): Payer: Medicare HMO

## 2021-06-03 ENCOUNTER — Ambulatory Visit (INDEPENDENT_AMBULATORY_CARE_PROVIDER_SITE_OTHER): Payer: Medicare HMO | Admitting: Internal Medicine

## 2021-06-03 ENCOUNTER — Encounter: Payer: Self-pay | Admitting: Internal Medicine

## 2021-06-03 VITALS — BP 122/74 | HR 70 | Temp 98.2°F | Ht 65.0 in | Wt 230.6 lb

## 2021-06-03 DIAGNOSIS — R059 Cough, unspecified: Secondary | ICD-10-CM | POA: Diagnosis not present

## 2021-06-03 DIAGNOSIS — M0600 Rheumatoid arthritis without rheumatoid factor, unspecified site: Secondary | ICD-10-CM

## 2021-06-03 DIAGNOSIS — M109 Gout, unspecified: Secondary | ICD-10-CM | POA: Diagnosis not present

## 2021-06-03 MED ORDER — BENZONATATE 200 MG PO CAPS: 200.0000 mg | ORAL_CAPSULE | Freq: Three times a day (TID) | ORAL | 1 refills | Status: DC | PRN

## 2021-06-03 NOTE — Assessment & Plan Note (Signed)
Reviewed her concerns about using MTX in the setting of prophylactic dosing of augmentin

## 2021-06-03 NOTE — Assessment & Plan Note (Signed)
She has had 2 episodes in less than 4 months.  Checking uric acid level.  Will start allopurinol if > 6.0  Per patient request,  Advised on the correct dosing of colchicine and the need to supplement with analgesics.

## 2021-06-03 NOTE — Progress Notes (Signed)
Subjective:  Patient ID: Tracey Wiley, female    DOB: January 31, 1956  Age: 65 y.o. MRN: [id number]  CC: The primary encounter diagnosis was Acute gout of hand, unspecified cause, unspecified laterality. Diagnoses of Cough in adult and Rheumatoid arthritis with negative rheumatoid factor, involving unspecified site [facility]) were also pertinent to this visit.  HPI Tracey Wiley presents for  follow up on recurrent gout and rheumatoid arthritis  Chief Complaint  Patient presents with   Follow-up    Follow up on gout treatment   This visit occurred during the SARS-CoV-2 public health emergency.  Safety protocols were in place, including screening questions prior to the visit, additional usage of staff PPE, and extensive cleaning of exam room while observing appropriate contact time as indicated for disinfecting solutions.   65 yr old female with history of gout, recent diagnosis of rheumatoid arthritis  presents with recent recurrence of gout.   Hx:   Received the Prevnar vaccine on  Nov 27,    Developed a dry cough on Nov  29 that lasted 3 weeks. And has resolved except for the period from 3 to 5 am Developed  gout flare involving the PIP on the on left middle finger  on Dec 3,  then again on Dec 13  in the right thumb . Self treated with colchicine , tok a total of  3.6 mg in 9 hours , several hours later  developed nausea followed by intractable vomiting and diarrhea .  The n/v stopped after 8 hours but had diarrhea for  another 48 hours .  The diarrhea was noted to contain undigested food   Diet reviewed:  no red meat,  vegetarian diet except for salmon and chicken. Drinks a minimum of 115 ounces of water daily,  no alcohol.   RA:  has been waiting to start  MTX .  First dose taken  on Nov 6 but developed a viral  illness and has not been able to restart  MTX  because she has had one infection or illness after another or a reason . Very frustrated , also advised  by her rheumatologist that the  antibiotics she takes weekly before her dental work, which she is finally having done,  could magnify the side effects of MTX   Outpatient Medications Prior to Visit  Medication Sig Dispense Refill   acetaminophen (TYLENOL) 500 MG tablet Take 500 mg by mouth daily as needed for pain.     albuterol (VENTOLIN HFA) 108 (90 Base) MCG/ACT inhaler Inhale 2 puffs into the lungs every 6 (six) hours as needed for wheezing or shortness of breath. 1 each 1   amoxicillin-clavulanate (AUGMENTIN) 875-125 MG tablet Take 1 tablet by mouth 2 (two) times daily. With food 14 tablet 0   aspirin EC 81 MG tablet Take 81 mg by mouth daily.     calcium-vitamin D 250-100 MG-UNIT tablet Take 1 tablet by mouth 2 (two) times daily.     chlorhexidine (PERIDEX) 0.12 % solution GARGLE AND SPIT 15 MLS IN THE MOUTH OR THROAT PRN. 473 mL 0   [facility] HYDROCORTISONE ANTI-ITCH 0.5 % APPLY TO THE AFFECTED AREA TWICE A DAY 57 g 11   Echinacea 125 MG CAPS Take by mouth.     esomeprazole (NEXIUM) 40 MG capsule TAKE 1 CAPSULE BY MOUTH DAILY AT 12 NOON. **PLEASE CALL OFFICE TO SCHEDULE APPOINTMENT** 90 capsule 0   ezetimibe (ZETIA) 10 MG tablet TAKE 1 TABLET BY MOUTH EVERY DAY 90 tablet  1   folic acid (FOLVITE) 1 MG tablet Take 1 tablet by mouth daily.     furosemide (LASIX) 20 MG tablet TAKE 1 TABLET BY MOUTH 4 DAYS PER WEEK AS DIRECTED 48 tablet 3   hyoscyamine (LEVSIN SL) 0.125 MG SL tablet Place 1 tablet (0.125 mg total) under the tongue every 4 (four) hours as needed for cramping. 60 tablet 3   ibuprofen (ADVIL,MOTRIN) 200 MG tablet Take by mouth as needed.     Iodoquinol-HC-Aloe Polysacch 1-2-1 % GEL Apply twice daily as needed to affected area 48 g 1   magnesium gluconate (MAGONATE) 500 MG tablet Take 500 mg by mouth 2 (two) times daily.     methotrexate (RHEUMATREX) 2.5 MG tablet Take by mouth.     ondansetron (ZOFRAN ODT) 4 MG disintegrating tablet Take 1 tablet (4 mg total) by mouth every 8 (eight) hours as needed for nausea or  vomiting. 20 tablet 0   potassium chloride (KLOR-CON) 10 MEQ tablet Take 1 tablet (10 mEq total) by mouth daily. Take 1 tablet daily, 4 times a week. Take with Lasix (may take extra tab with extra lasix) 90 tablet 3   promethazine (PHENERGAN) 12.5 MG tablet Take 1 tablet (12.5 mg total) by mouth every 8 (eight) hours as needed for nausea or vomiting. 20 tablet 0   sotalol (BETAPACE) 80 MG tablet TAKE 1 TABLET BY MOUTH 2 TIMES DAILY. 180 tablet 2   triamterene-hydrochlorothiazide (DYAZIDE) 37.5-25 MG capsule TAKE 1 CAPSULE BY MOUTH EVERY DAY 90 capsule 2   valACYclovir (VALTREX) 1000 MG tablet TAKE 1 TABLET BY MOUTH DAILY FOR SUPPRESSION , OR 5 TIMES DAILY FOR FLARE 35 tablet 2   vitamin C (ASCORBIC ACID) 500 MG tablet Take 500 mg by mouth daily.     zinc gluconate 50 MG tablet Take 50 mg by mouth daily.     colchicine 0.6 MG tablet TAKE 1 TABLET (0.6 MG TOTAL) BY MOUTH 2 (TWO) TIMES DAILY AS NEEDED. (Patient not taking: Reported on 06/03/2021) 180 tablet 1   predniSONE (DELTASONE) 10 MG tablet 6 tablets daily for 3 days, then reduce by 1 tablet daily until gone (Patient not taking: Reported on 06/03/2021) 33 tablet 0   sucralfate (CARAFATE) 1 g tablet Take 1 tablet (1 g total) by mouth 4 (four) times daily -  with meals and at bedtime. (Patient not taking: No sig reported) 360 tablet 0   sulfamethoxazole-trimethoprim (BACTRIM DS) 800-160 MG tablet Take 1 tablet by mouth 2 (two) times daily. (Patient not taking: Reported on 04/16/2021) 14 tablet 1   No facility-administered medications prior to visit.    Review of Systems;  Patient denies headache, fevers, malaise, unintentional weight loss, skin rash, eye pain, sinus congestion and sinus pain, sore throat, dysphagia,  hemoptysis , cough, dyspnea, wheezing, chest pain, palpitations, orthopnea, edema, abdominal pain, nausea, melena, diarrhea, constipation, flank pain, dysuria, hematuria, urinary  Frequency, nocturia, numbness, tingling, seizures,   Focal weakness, Loss of consciousness,  Tremor, insomnia, depression, anxiety, and suicidal ideation.      Objective:  BP 122/74 (BP Location: Left Arm, Patient Position: Sitting, Cuff Size: Large)    Pulse 70    Temp 98.2 F (36.8 C) (Oral)    Ht 5\' 5"  (1.651 m)    Wt 230 lb 9.6 oz (104.6 kg)    SpO2 95%    BMI 38.37 kg/m   BP Readings from Last 3 Encounters:  06/03/21 122/74  04/02/21 110/64  03/19/21 139/81    Wt  Readings from Last 3 Encounters:  06/03/21 230 lb 9.6 oz (104.6 kg)  04/16/21 237 lb 6.4 oz (107.7 kg)  04/02/21 237 lb 6.4 oz (107.7 kg)    General appearance: alert, cooperative and appears stated age Ears: normal TM's and external ear canals both ears Throat: lips, mucosa, and tongue normal; teeth and gums normal Neck: no adenopathy, no carotid bruit, supple, symmetrical, trachea midline and thyroid not enlarged, symmetric, no tenderness/mass/nodules Back: symmetric, no curvature. ROM normal. No CVA tenderness. Lungs: clear to auscultation bilaterally Heart: regular rate and rhythm, S1, S2 normal, no murmur, click, rub or gallop Abdomen: soft, non-tender; bowel sounds normal; no masses,  no organomegaly Pulses: 2+ and symmetric Skin: Skin color, texture, turgor normal. No rashes or lesions Lymph nodes: Cervical, supraclavicular, and axillary nodes normal. Ext:  no synovitis , erythema or swelling of PIPs   Lab Results  Component Value Date   HGBA1C 6.0 (H) 08/18/2020   HGBA1C 6.2 (H) 03/20/2020   HGBA1C 6.2 01/23/2019    Lab Results  Component Value Date   CREATININE 0.69 03/18/2021   CREATININE 0.83 02/06/2021   CREATININE 0.77 08/18/2020    Lab Results  Component Value Date   WBC 8.6 08/18/2020   HGB 13.3 08/18/2020   HCT 40.0 08/18/2020   PLT 286 08/18/2020   GLUCOSE 152 (H) 03/18/2021   CHOL 237 (H) 02/16/2021   TRIG 164 (H) 02/16/2021   HDL 45 02/16/2021   LDLDIRECT 136.0 01/23/2019   LDLCALC 159 (H) 02/16/2021   ALT 16 02/06/2021   AST  20 02/06/2021   NA 134 (L) 03/18/2021   K 3.6 03/18/2021   CL 96 (L) 03/18/2021   CREATININE 0.69 03/18/2021   BUN 16 03/18/2021   CO2 29 03/18/2021   TSH 2.745 03/20/2020   HGBA1C 6.0 (H) 08/18/2020    CT CORONARY MORPH W/CTA COR W/SCORE W/CA W/CM &/OR WO/CM  Addendum Date: 03/20/2021   ADDENDUM REPORT: 03/20/2021 08:35 EXAM: OVER-READ INTERPRETATION  CT CHEST The following report is an over-read performed by radiologist Dr. Estanislado Pandy [facility] on 03/20/2021. This over-read does not include interpretation of cardiac or coronary anatomy or pathology. The coronary CTA interpretation by the cardiologist is attached. COMPARISON:  03/12/2019 FINDINGS: Visualized mediastinal structures are normal. Images of the upper abdomen are unremarkable. Visualized lungs are clear. No pleural effusions. No acute bone abnormality. IMPRESSION: No acute extracardiac findings. Electronically Signed   By: Markus Daft M.D.   On: 03/20/2021 08:35   Result Date: 03/20/2021 CLINICAL DATA:  Chest pain EXAM: Cardiac/Coronary  CTA TECHNIQUE: The patient was scanned on a Siemens Somatom go.Top scanner. : A retrospective scan was triggered in the descending thoracic aorta. Axial non-contrast 3 mm slices were carried out through the heart. The data set was analyzed on a dedicated work station and scored using the [place]. Gantry rotation speed was 330 msecs and collimation was .6 mm. 50mg  of metoprolol and 0.8 mg of sl NTG was given. The 3D data set was reconstructed in 5% intervals of the 60-95 % of the R-R cycle. Diastolic phases were analyzed on a dedicated work station using MPR, MIP and VRT modes. The patient received 100 cc of contrast. FINDINGS: Aorta: Normal size. Mild aortic root calcifications close to the ostial RCA. No dissection. Aortic Valve:  Trileaflet.  No calcifications. Coronary Arteries:  Normal coronary origin.  Right dominance. RCA is a dominant artery that gives rise to PDA and PLA.  There is no plaque. Left  main is a large artery that gives rise to LAD and LCX arteries. LAD no plaque. LCX is a non-dominant artery that gives rise to two obtuse marginal branches. There is no plaque. Other findings: Normal pulmonary vein drainage into the left atrium. Normal left atrial appendage without a thrombus. Normal size of the pulmonary artery. Closure device noted in the atrial septum. No evidence for leak or atrial septal defect. IMPRESSION: 1. Coronary calcium score of 0. Patient is low risk for coronary events. 2. Normal coronary origin with right dominance. 3. No evidence of CAD. 4. Closure device noted in the atrial septum. No evidence for leak or atrial septal defect. 5. CAD-RADS 0. No evidence of CAD (0%). Consider non-atherosclerotic causes of chest pain. Electronically Signed: By: Kate Sable M.D. On: 03/19/2021 16:46    Assessment & Plan:   Problem List Items Addressed This Visit     Acute gout of hand - Primary    She has had 2 episodes in less than 4 months.  Checking uric acid level.  Will start allopurinol if > 6.0  Per patient request,  Advised on the correct dosing of colchicine and the need to supplement with analgesics.       Relevant Orders   Uric acid   Sedimentation rate   C-reactive protein   CBC with Differential/Platelet   Comprehensive metabolic panel   Rheumatoid arthritis ([place])    Reviewed her concerns about using MTX in the setting of prophylactic dosing of augmentin      Cough in adult    Etiology unclear.  Chest x ray ordered.  Adding famotidine 20 mg qpm as empiric tx for GERD .       Relevant Orders   DG Chest 2 View    I have discontinued Tracey Wiley. Tracey Wiley's sulfamethoxazole-trimethoprim, sucralfate, and predniSONE. I am also having her start on benzonatate. Additionally, I am having her maintain her acetaminophen, aspirin EC, ibuprofen, calcium-vitamin D, hyoscyamine, vitamin C, Echinacea, magnesium gluconate, valACYclovir, zinc  gluconate, chlorhexidine, albuterol, folic acid, methotrexate, potassium chloride, promethazine, ondansetron, furosemide, esomeprazole, Iodoquinol-HC-Aloe Polysacch, [facility] Hydrocortisone Anti-Itch, ezetimibe, amoxicillin-clavulanate, triamterene-hydrochlorothiazide, sotalol, and colchicine.  Meds ordered this encounter  Medications   benzonatate (TESSALON) 200 MG capsule    Sig: Take 1 capsule (200 mg total) by mouth 3 (three) times daily as needed for cough.    Dispense:  60 capsule    Refill:  1     I provided  30 minutes of  face-to-face time during this encounter reviewing patient's current problems and past surgeries, labs and imaging studies, providing counseling on the above mentioned problems , and coordination  of care .   Follow-up: Return in about 3 months (around 09/01/2021).   Crecencio Mc, MD

## 2021-06-03 NOTE — Patient Instructions (Addendum)
If you have another gout flare I recommend the following regimen of colchicine:   2 tablets as soon as the flare occurs,  then 1 tablet one hour later.  No more that day,  use tylenol or tramadol as needed for pain.   Starting on Day 2,  take one colchicine tablet every 12 hours until symptoms have resolved   You can take up to 4000 mg of acetominophen (tylenol) every day safely for gout flare, ( 5 days or less )  and you can use it chronically at 2000 mg  In divided doses (500 mg every 6 hours  Or 1000 mg every 12 hours.)    Add famotidine 20 mg at bedtime to see if cough resolves. (Before the tessalon )    Your shingles vaccines can be spaced 6 months apart

## 2021-06-03 NOTE — Assessment & Plan Note (Signed)
Etiology unclear.  Chest x ray ordered.  Adding famotidine 20 mg qpm as empiric tx for GERD .

## 2021-06-04 LAB — CBC WITH DIFFERENTIAL/PLATELET
Basophils Absolute: 0.1 10*3/uL (ref 0.0–0.1)
Basophils Relative: 0.9 % (ref 0.0–3.0)
Eosinophils Absolute: 0.3 10*3/uL (ref 0.0–0.7)
Eosinophils Relative: 2.8 % (ref 0.0–5.0)
HCT: 38.8 % (ref 36.0–46.0)
Hemoglobin: 12.8 g/dL (ref 12.0–15.0)
Lymphocytes Relative: 31.2 % (ref 12.0–46.0)
Lymphs Abs: 2.9 10*3/uL (ref 0.7–4.0)
MCHC: 33.1 g/dL (ref 30.0–36.0)
MCV: 83.2 fl (ref 78.0–100.0)
Monocytes Absolute: 0.6 10*3/uL (ref 0.1–1.0)
Monocytes Relative: 6.3 % (ref 3.0–12.0)
Neutro Abs: 5.4 10*3/uL (ref 1.4–7.7)
Neutrophils Relative %: 58.8 % (ref 43.0–77.0)
Platelets: 312 10*3/uL (ref 150.0–400.0)
RBC: 4.66 Mil/uL (ref 3.87–5.11)
RDW: 15.4 % (ref 11.5–15.5)
WBC: 9.2 10*3/uL (ref 4.0–10.5)

## 2021-06-04 LAB — C-REACTIVE PROTEIN: CRP: 2.6 mg/dL (ref 0.5–20.0)

## 2021-06-04 LAB — COMPREHENSIVE METABOLIC PANEL
ALT: 15 U/L (ref 0–35)
AST: 20 U/L (ref 0–37)
Albumin: 4 g/dL (ref 3.5–5.2)
Alkaline Phosphatase: 67 U/L (ref 39–117)
BUN: 14 mg/dL (ref 6–23)
CO2: 31 mEq/L (ref 19–32)
Calcium: 9.3 mg/dL (ref 8.4–10.5)
Chloride: 100 mEq/L (ref 96–112)
Creatinine, Ser: 0.78 mg/dL (ref 0.40–1.20)
GFR: 80.84 mL/min (ref >60.00)
Glucose, Bld: 100 mg/dL — ABNORMAL HIGH (ref 70–99)
Potassium: 3.7 mEq/L (ref 3.5–5.1)
Sodium: 138 mEq/L (ref 135–145)
Total Bilirubin: 0.5 mg/dL (ref 0.2–1.2)
Total Protein: 7.1 g/dL (ref 6.0–8.3)

## 2021-06-04 LAB — URIC ACID: Uric Acid, Serum: 6.7 mg/dL (ref 2.4–7.0)

## 2021-06-04 LAB — SEDIMENTATION RATE: Sed Rate: 68 mm/hr — ABNORMAL HIGH (ref 0–30)

## 2021-06-11 ENCOUNTER — Telehealth: Payer: Self-pay | Admitting: Internal Medicine

## 2021-06-11 DIAGNOSIS — R059 Cough, unspecified: Secondary | ICD-10-CM

## 2021-06-11 NOTE — Telephone Encounter (Signed)
Pt called in stating that the medication famotidine is not helping her cough and pt did it for eight nights. Pt said the cough is the same and she is coughing all night and a lot during the day.Pt said  it is helping her morning nausea. She is getting less nauseated in the morning. Pt said provider said she had another plan and pt want to know what the plan is. Based on chest xray provider was going to try something else.

## 2021-06-11 NOTE — Telephone Encounter (Signed)
My Chart message sent

## 2021-06-12 NOTE — Telephone Encounter (Signed)
Spoke with pt and she stated that she did see the mychart message last night. She added the tessalon and benadryl to the famotidine it did seem to start helping some after a couple of hours so pt stated that she is going to try this over the weekend and give [place] a call back on Wednesday to let us know if there is any change.

## 2021-06-13 ENCOUNTER — Other Ambulatory Visit: Payer: Self-pay | Admitting: Physician Assistant

## 2021-06-16 ENCOUNTER — Ambulatory Visit: Payer: Medicare HMO | Admitting: Internal Medicine

## 2021-06-24 ENCOUNTER — Telehealth: Payer: Self-pay | Admitting: Internal Medicine

## 2021-06-24 NOTE — Telephone Encounter (Signed)
Hi Dr. Carlean Purl,  Patient would like to confirm when she would be due for her next colonoscopy recall in Epic states 2025 however the last colonoscopy that was done in 2020 states she due every year.  Thanks

## 2021-06-25 NOTE — Telephone Encounter (Signed)
Let her know that I reviewed the genetics evaluation she has had and yes - every 5 years is needed NOT every year.  I changed health maintenance listing  Please enter a new colon recall for 04/2024

## 2021-07-07 ENCOUNTER — Ambulatory Visit: Payer: Medicare HMO | Admitting: Internal Medicine

## 2021-07-17 ENCOUNTER — Ambulatory Visit: Payer: Medicare HMO | Admitting: Physician Assistant

## 2021-07-22 ENCOUNTER — Ambulatory Visit: Payer: Medicare HMO | Admitting: Internal Medicine

## 2021-07-23 ENCOUNTER — Ambulatory Visit: Payer: Medicare HMO | Admitting: Physician Assistant

## 2021-07-30 ENCOUNTER — Other Ambulatory Visit: Payer: Self-pay | Admitting: Internal Medicine

## 2021-07-31 ENCOUNTER — Encounter: Payer: Self-pay | Admitting: Physician Assistant

## 2021-07-31 ENCOUNTER — Telehealth: Payer: Self-pay | Admitting: Physician Assistant

## 2021-07-31 ENCOUNTER — Telehealth (INDEPENDENT_AMBULATORY_CARE_PROVIDER_SITE_OTHER): Payer: Medicare HMO | Admitting: Physician Assistant

## 2021-07-31 DIAGNOSIS — Z800 Family history of malignant neoplasm of digestive organs: Secondary | ICD-10-CM | POA: Diagnosis not present

## 2021-07-31 DIAGNOSIS — K2100 Gastro-esophageal reflux disease with esophagitis, without bleeding: Secondary | ICD-10-CM | POA: Diagnosis not present

## 2021-07-31 MED ORDER — ESOMEPRAZOLE MAGNESIUM 40 MG PO CPDR: 40.0000 mg | DELAYED_RELEASE_CAPSULE | Freq: Every day | ORAL | 3 refills | Status: DC
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
## 2021-07-31 NOTE — Patient Instructions (Signed)
Mrs. Ruder,  It was good to see you today, I have sent a refill of your Omeprazole 40 mg once daily to the pharmacy for you #90 with 3 refills.  This should be good for a year.  If you are doing well in a year then we can just refill for another year at that time without you being seen.  Would recommend you continue the Pepcid 20 mg at night as we discussed.  If you have any problems or increase in symptoms please let us know.  Sincerely, Ellouise Newer, PA-C

## 2021-07-31 NOTE — Telephone Encounter (Signed)
[name], hannig are scheduled for a virtual visit with your provider today.    Just as we do with appointments in the office, we must obtain your consent to participate.  Your consent will be active for this visit and any virtual visit you may have with one of our providers in the next 365 days.    If you have a MyChart account, I can also send a copy of this consent to you electronically.  All virtual visits are billed to your insurance company just like a traditional visit in the office.  As this is a virtual visit, video technology does not allow for your provider to perform a traditional examination.  This may limit your provider's ability to fully assess your condition.  If your provider identifies any concerns that need to be evaluated in person or the need to arrange testing such as labs, EKG, etc, we will make arrangements to do so.    Although advances in technology are sophisticated, we cannot ensure that it will always work on either your end or our end.  If the connection with a video visit is poor, we may have to switch to a telephone visit.  With either a video or telephone visit, we are not always able to ensure that we have a secure connection.   I need to obtain your verbal consent now.   Are you willing to proceed with your visit today?   Tracey Wiley has provided verbal consent on 07/31/2021 for a virtual visit (video or telephone).   [name] [place], [place] 07/31/2021  3:15 PM

## 2021-08-13 ENCOUNTER — Ambulatory Visit: Payer: Medicare HMO | Admitting: Internal Medicine

## 2021-08-13 ENCOUNTER — Other Ambulatory Visit: Payer: Self-pay

## 2021-08-13 ENCOUNTER — Encounter: Payer: Self-pay | Admitting: Internal Medicine

## 2021-08-13 DIAGNOSIS — R059 Cough, unspecified: Secondary | ICD-10-CM | POA: Diagnosis not present

## 2021-08-13 MED ORDER — PREDNISONE 10 MG PO TABS: ORAL_TABLET | ORAL | 0 refills | Status: DC

## 2021-08-13 MED ORDER — AMOXICILLIN-POT CLAVULANATE 875-125 MG PO TABS: 1.0000 | ORAL_TABLET | Freq: Two times a day (BID) | ORAL | 0 refills | Status: AC

## 2021-08-13 NOTE — Patient Instructions (Addendum)
Only use your albuterol as a rescue medication to be used if you can't catch your breath by resting or doing a relaxed purse lip breathing pattern.  ?- The less you use it, the better it will work when you need it. ?- Ok to use up to 2 puffs  every 4 hours if you must but call for immediate appointment if use goes up over your usual need ?- Don't leave home without it !!  (think of it like the spare tire or starter fluid  for your car)  ? ?For cough > mucinex dm 1200 mg every 12 hours supplement with tessalon 200 mg  ? ?Continue nexium 40 mg  Take 30-60 min before first meal of the day / pepcid 20 mg after last meal  ? ?GERD (REFLUX)  is an extremely common cause of respiratory symptoms just like yours , many times with no obvious heartburn at all.  ? ? It can be treated with medication, but also with lifestyle changes including elevation of the head of your bed  on a wege up 30 degrees.  Smoking cessation, avoidance of late meals, excessive alcohol, and avoid fatty foods, chocolate, peppermint, colas, red wine, and acidic juices such as orange juice.  ?NO MINT OR MENTHOL PRODUCTS SO NO COUGH DROPS  ?USE SUGARLESS CANDY INSTEAD (Jolley ranchers or Stover's or Life Savers) or even ice chips will also do - the key is to swallow to prevent all throat clearing. ?NO OIL BASED VITAMINS - use powdered substitutes.  Avoid fish oil when coughing.  ? ?Augmentin 875 mg take one pill twice daily  X 10 days - take at breakfast and supper with large glass of water.  It would help reduce the usual side effects (diarrhea and yeast infections) if you ate cultured yogurt at lunch.  ? ?Prednisone 10 mg  x 6 x 2d, 5 x 2d , 4, 3, 2 1 and one half  one and off  = 33  ? ?F/u if  not 100% in 2 weeks  ? ? ? ? ? ? ?
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
## 2021-08-16 ENCOUNTER — Encounter: Payer: Self-pay | Admitting: Internal Medicine

## 2021-08-16 NOTE — Assessment & Plan Note (Addendum)
Onset 2012 ?- MRI sinus 10/28/17 neg  ?- 08/13/2021 max rx for gerd combined with rhinitis/sinusitis/ bronchitis coverage and pulmonary f/u at next onset before cyclical coughing sets in  ? ?Upper airway cough syndrome (previously labeled PNDS), is favored over cough variant asthma here(note on B blockers so at risk)  and  is so named because it's frequently impossible to sort out how much is  CR/sinusitis with freq throat clearing (which can be related to primary GERD)   vs  causing  secondary (" extra esophageal")  GERD from wide swings in gastric pressure that occur with throat clearing, often  promoting self use of mint and menthol lozenges that reduce the lower esophageal sphincter tone and exacerbate the problem further in a cyclical fashion.  ? ?These are the same pts (now being labeled as having "irritable larynx syndrome" by some cough centers) who not infrequently have a history of having failed to tolerate ace inhibitors,  dry powder inhalers or biphosphonates or report having atypical/extraesophageal reflux symptoms that don't respond to standard doses of PPI  and are easily confused as having aecopd or asthma flares by even experienced allergists/ pulmonologists (myself included).  ? ? ?She is convinced all she needs is a longer course of pred/ augmentin which I thinks is merited based on hx though not convinced this will work so advised on the following: ? ?Of the three most common causes of  Sub-acute / recurrent or chronic cough, only one (GERD)  can actually contribute to/ trigger  the other two (asthma and post nasal drip syndrome)  and perpetuate the cylce of cough. ? ?While not intuitively obvious, many patients with chronic low grade reflux do not cough until there is a primary insult that disturbs the protective epithelial barrier and exposes sensitive nerve endings.   This is typically viral but can due to PNDS and  either may apply here. ? ?>>>   The point is that once this occurs, it is  difficult to eliminate the cycle  using anything but a maximally effective acid suppression regimen at least in the short run, accompanied by an appropriate diet to address non acid GERD and control / eliminate the cough itself if possible with mucinex dm/ hard rock candies and avoid narcotics - see avs for instructions unique to this ov ?  ? ?F/u at next flare if not better w/in 2 weeks  ? ?    ?  ? ?Each maintenance medication was reviewed in detail including emphasizing most importantly the difference between maintenance and prns and under what circumstances the prns are to be triggered using an action plan format where appropriate. ? ?Total time for H and P, chart review, counseling,   and generating customized AVS unique to this office visit / same day charting  > 45 min  ?     ?

## 2021-09-01 ENCOUNTER — Ambulatory Visit: Payer: Medicare HMO | Admitting: Internal Medicine

## 2021-09-03 ENCOUNTER — Ambulatory Visit (INDEPENDENT_AMBULATORY_CARE_PROVIDER_SITE_OTHER): Payer: Medicare HMO | Admitting: Internal Medicine

## 2021-09-03 ENCOUNTER — Ambulatory Visit: Payer: Medicare HMO | Admitting: Internal Medicine

## 2021-09-03 ENCOUNTER — Encounter: Payer: Self-pay | Admitting: Internal Medicine

## 2021-09-03 DIAGNOSIS — R059 Cough, unspecified: Secondary | ICD-10-CM

## 2021-09-03 DIAGNOSIS — R600 Localized edema: Secondary | ICD-10-CM

## 2021-09-03 DIAGNOSIS — M0600 Rheumatoid arthritis without rheumatoid factor, unspecified site: Secondary | ICD-10-CM

## 2021-09-03 DIAGNOSIS — Z1379 Encounter for other screening for genetic and chromosomal anomalies: Secondary | ICD-10-CM

## 2021-09-03 DIAGNOSIS — M109 Gout, unspecified: Secondary | ICD-10-CM

## 2021-09-03 MED ORDER — PREDNISONE 10 MG PO TABS: ORAL_TABLET | ORAL | 0 refills | Status: DC

## 2021-09-03 NOTE — Patient Instructions (Signed)
? ?  I'm so glad you are feeling better and the cough has resolved ? ?Now go be with your baby and know that   I am praying for you three  ?

## 2021-09-03 NOTE — Progress Notes (Signed)
? ?Subjective:  ?Patient ID: Tracey Wiley, female    DOB: [birth year]  Age: 66 y.o. MRN: [id number] ? ?CC: Diagnoses of Rheumatoid arthritis with negative rheumatoid factor, involving unspecified site [facility]), 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, Cough in adult c/w upper airway cough syndrome, Bilateral lower extremity edema, and Gouty arthritis were pertinent to this visit. ? ? ?This visit occurred during the SARS-CoV-2 public health emergency.  Safety protocols were in place, including screening questions prior to the visit, additional usage of staff PPE, and extensive cleaning of exam room while observing appropriate contact time as indicated for disinfecting solutions.   ? ?HPI ?Tracey Wiley presents for follow up on multiple issues  ?Chief Complaint  ?Patient presents with  ? Follow-up  ?  3 mo f/u GOUT/R.A - Pt reports no recent episodes of GOUT. Saw Pulmonology March 9th, was given Augmentin and Prednisone. Cough is gone.   ? ? ?1) Gouty arthritis:  no recent flares .  Reviewed indications for starting allopurinol but she prefers to avoid a preventive medication due to the risk of having another flare.  ?Lab Results  ?Component Value Date  ? LABURIC 6.7 06/03/2021  ?  ?2) RA:  managed by Posey Pronto . She has had a recent meeting with him and cites an improved relationship since she confronted him on several issues.  She is taking MTX  but has been unable to take the prescribed dose due to severe headache and back ache that occurred and lasted several days after taking the prescribed dose.  She is currently tolerating mtx at 7.5 mg dose .  She is aware that she may need to start Humira.  ? ?3) Cough:  she was referred to pulmonology for 3 months of persistent cough .  She was evaluated by Dr Melvyn Novas and prescribed a round of augmentin and prednisone which resolved her cough.  [facility] , THE ONE BY ME. But she took the dose recommended by [facility]    ? ?4) family history of colon CA:  she was  recently told that her follow up screening was not due more than every 5 years , which she requested clarification since she had previously been told that more frquent screening was needed.  Reviewed her genetics testing and Dr Celesta Aver notes with patient. ? ?Outpatient Medications Prior to Visit  ?Medication Sig Dispense Refill  ? acetaminophen (TYLENOL) 500 MG tablet Take 500 mg by mouth daily as needed for pain.    ? albuterol (VENTOLIN HFA) 108 (90 Base) MCG/ACT inhaler Inhale 2 puffs into the lungs every 6 (six) hours as needed for wheezing or shortness of breath. 1 each 1  ? aspirin EC 81 MG tablet Take 81 mg by mouth daily.    ? benzonatate (TESSALON) 200 MG capsule Take 1 capsule (200 mg total) by mouth 3 (three) times daily as needed for cough. 60 capsule 1  ? calcium-vitamin D 250-100 MG-UNIT tablet Take 1 tablet by mouth 2 (two) times daily.    ? chlorhexidine (PERIDEX) 0.12 % solution GARGLE AND SPIT 15 MLS IN THE MOUTH OR THROAT PRN. 473 mL 0  ? colchicine 0.6 MG tablet TAKE 1 TABLET (0.6 MG TOTAL) BY MOUTH 2 (TWO) TIMES DAILY AS NEEDED. 180 tablet 1  ? CVS HYDROCORTISONE ANTI-ITCH 0.5 % APPLY TO THE AFFECTED AREA TWICE A DAY 57 g 11  ? Echinacea 125 MG CAPS Take by mouth.    ? esomeprazole (NEXIUM) 40 MG capsule Take 1  capsule (40 mg total) by mouth daily at 12 noon. 90 capsule 3  ? ezetimibe (ZETIA) 10 MG tablet TAKE 1 TABLET BY MOUTH EVERY DAY 90 tablet 1  ? folic acid (FOLVITE) 1 MG tablet Take 1 tablet by mouth daily.    ? furosemide (LASIX) 20 MG tablet TAKE 1 TABLET BY MOUTH 4 DAYS PER WEEK AS DIRECTED 48 tablet 3  ? hyoscyamine (LEVSIN SL) 0.125 MG SL tablet Place 1 tablet (0.125 mg total) under the tongue every 4 (four) hours as needed for cramping. 60 tablet 3  ? ibuprofen (ADVIL,MOTRIN) 200 MG tablet Take by mouth as needed.    ? Iodoquinol-HC-Aloe Polysacch 1-2-1 % GEL Apply twice daily as needed to affected area 48 g 1  ? magnesium gluconate (MAGONATE) 500 MG tablet Take 500 mg by mouth 2  (two) times daily.    ? methotrexate (RHEUMATREX) 2.5 MG tablet Take by mouth.    ? ondansetron (ZOFRAN ODT) 4 MG disintegrating tablet Take 1 tablet (4 mg total) by mouth every 8 (eight) hours as needed for nausea or vomiting. 20 tablet 0  ? potassium chloride (KLOR-CON) 10 MEQ tablet Take 1 tablet (10 mEq total) by mouth daily. Take 1 tablet daily, 4 times a week. Take with Lasix (may take extra tab with extra lasix) 90 tablet 3  ? promethazine (PHENERGAN) 12.5 MG tablet Take 1 tablet (12.5 mg total) by mouth every 8 (eight) hours as needed for nausea or vomiting. 20 tablet 0  ? sotalol (BETAPACE) 80 MG tablet TAKE 1 TABLET BY MOUTH 2 TIMES DAILY. 180 tablet 2  ? triamterene-hydrochlorothiazide (DYAZIDE) 37.5-25 MG capsule TAKE 1 CAPSULE BY MOUTH EVERY DAY 90 capsule 2  ? valACYclovir (VALTREX) 1000 MG tablet TAKE 1 TABLET BY MOUTH DAILY FOR SUPPRESSION , OR 5 TIMES DAILY FOR FLARE 35 tablet 2  ? vitamin C (ASCORBIC ACID) 500 MG tablet Take 500 mg by mouth daily.    ? zinc gluconate 50 MG tablet Take 50 mg by mouth daily.    ? predniSONE (DELTASONE) 10 MG tablet 6 x 2 d, 5 x 2 days, 4 , 3, 2,1 and then one half x 2 days and stop 33 tablet 0  ? ?No facility-administered medications prior to visit.  ? ? ?Review of Systems; ? ?Patient denies headache, fevers, malaise, unintentional weight loss, skin rash, eye pain, sinus congestion and sinus pain, sore throat, dysphagia,  hemoptysis , cough, dyspnea, wheezing, chest pain, palpitations, orthopnea, edema, abdominal pain, nausea, melena, diarrhea, constipation, flank pain, dysuria, hematuria, urinary  Frequency, nocturia, numbness, tingling, seizures,  Focal weakness, Loss of consciousness,  Tremor, insomnia, depression, anxiety, and suicidal ideation.   ? ? ? ?Objective:  ?BP 138/80 (BP Location: Left Arm, Patient Position: Sitting, Cuff Size: Large)   Pulse 74   Temp 98.2 ?F (36.8 ?C) (Oral)   Ht '5\' 5"'$  (1.651 m)   Wt 225 lb 9.6 oz (102.3 kg)   SpO2 98%   BMI  37.54 kg/m?  ? ?BP Readings from Last 3 Encounters:  ?09/03/21 138/80  ?08/13/21 128/74  ?06/03/21 122/74  ? ? ?Wt Readings from Last 3 Encounters:  ?09/03/21 225 lb 9.6 oz (102.3 kg)  ?08/13/21 225 lb 9.6 oz (102.3 kg)  ?06/03/21 230 lb 9.6 oz (104.6 kg)  ? ? ?General appearance: alert, cooperative and appears stated age ?Ears: normal TM's and external ear canals both ears ?Throat: lips, mucosa, and tongue normal; teeth and gums normal ?Neck: no adenopathy, no carotid bruit, supple,  symmetrical, trachea midline and thyroid not enlarged, symmetric, no tenderness/mass/nodules ?Back: symmetric, no curvature. ROM normal. No CVA tenderness. ?Lungs: clear to auscultation bilaterally ?Heart: regular rate and rhythm, S1, S2 normal, no murmur, click, rub or gallop ?Abdomen: soft, non-tender; bowel sounds normal; no masses,  no organomegaly ?Pulses: 2+ and symmetric ?Skin: Skin color, texture, turgor normal. No rashes or lesions ?Lymph nodes: Cervical, supraclavicular, and axillary nodes normal. ? ?Lab Results  ?Component Value Date  ? HGBA1C 6.0 (H) 08/18/2020  ? HGBA1C 6.2 (H) 03/20/2020  ? HGBA1C 6.2 01/23/2019  ? ? ?Lab Results  ?Component Value Date  ? CREATININE 0.78 06/03/2021  ? CREATININE 0.69 03/18/2021  ? CREATININE 0.83 02/06/2021  ? ? ?Lab Results  ?Component Value Date  ? WBC 9.2 06/03/2021  ? HGB 12.8 06/03/2021  ? HCT 38.8 06/03/2021  ? PLT 312.0 06/03/2021  ? GLUCOSE 100 (H) 06/03/2021  ? CHOL 237 (H) 02/16/2021  ? TRIG 164 (H) 02/16/2021  ? HDL 45 02/16/2021  ? LDLDIRECT 136.0 01/23/2019  ? LDLCALC 159 (H) 02/16/2021  ? ALT 15 06/03/2021  ? AST 20 06/03/2021  ? NA 138 06/03/2021  ? K 3.7 06/03/2021  ? CL 100 06/03/2021  ? CREATININE 0.78 06/03/2021  ? BUN 14 06/03/2021  ? CO2 31 06/03/2021  ? TSH 2.745 03/20/2020  ? HGBA1C 6.0 (H) 08/18/2020  ? ? ?CT CORONARY MORPH W/CTA COR W/SCORE W/CA W/CM &/OR WO/CM ? ?Addendum Date: 03/20/2021   ?ADDENDUM REPORT: 03/20/2021 08:35 EXAM: OVER-READ INTERPRETATION  CT  CHEST The following report is an over-read performed by radiologist Dr. Estanislado Pandy [facility] on 03/20/2021. This over-read does not include interpretation of cardiac or coronary anatomy or pathology. T

## 2021-09-04 DIAGNOSIS — M109 Gout, unspecified: Secondary | ICD-10-CM | POA: Insufficient documentation

## 2021-09-04 NOTE — Assessment & Plan Note (Signed)
She has had to reduce her diuretic dosing to 3 times per week which I assured her should be fine.  ?

## 2021-09-04 NOTE — Assessment & Plan Note (Signed)
Managed by rheumatology with MTX currently,  Unable to reach therapuetic dosing due to S/E  ?

## 2021-09-04 NOTE — Assessment & Plan Note (Signed)
She has been advised to Continue annual mammogram and repeat colonoscopy in 5 yrs ?

## 2021-09-04 NOTE — Assessment & Plan Note (Signed)
She has deferred preventive treatment with allopurinol.  Reviewed use of colchicine  ?

## 2021-09-04 NOTE — Assessment & Plan Note (Signed)
Her cough resolved after an empiric round of prednisone and augmentin  ?

## 2021-09-26 ENCOUNTER — Other Ambulatory Visit: Payer: Self-pay | Admitting: Internal Medicine

## 2021-09-26 ENCOUNTER — Other Ambulatory Visit: Payer: Self-pay | Admitting: Cardiovascular Disease

## 2021-09-26 DIAGNOSIS — E785 Hyperlipidemia, unspecified: Secondary | ICD-10-CM

## 2021-10-06 ENCOUNTER — Telehealth: Payer: Self-pay | Admitting: Internal Medicine

## 2021-10-06 NOTE — Telephone Encounter (Signed)
Pt called in requesting referral for therapy/psychologist... Pt stated she is losing her mind... Pt requesting to talk with someone...  ?

## 2021-10-07 ENCOUNTER — Encounter: Payer: Self-pay | Admitting: Internal Medicine

## 2021-10-07 ENCOUNTER — Telehealth (INDEPENDENT_AMBULATORY_CARE_PROVIDER_SITE_OTHER): Payer: Medicare HMO | Admitting: Internal Medicine

## 2021-10-07 VITALS — HR 68 | Ht 65.0 in | Wt 225.6 lb

## 2021-10-07 DIAGNOSIS — F4329 Adjustment disorder with other symptoms: Secondary | ICD-10-CM | POA: Insufficient documentation

## 2021-10-07 DIAGNOSIS — F4321 Adjustment disorder with depressed mood: Secondary | ICD-10-CM

## 2021-10-07 NOTE — Telephone Encounter (Signed)
Spoke with pt and scheduled her for an appt with Dr. Derrel Nip today.  ?
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
## 2021-10-08 ENCOUNTER — Ambulatory Visit: Payer: Medicare HMO | Admitting: Internal Medicine

## 2021-10-08 ENCOUNTER — Ambulatory Visit
Admission: RE | Admit: 2021-10-08 | Discharge: 2021-10-08 | Disposition: A | Discharge status: Home / Self Care | Payer: Medicare HMO | Source: Ambulatory Visit | Attending: Internal Medicine | Admitting: Internal Medicine

## 2021-10-08 ENCOUNTER — Encounter: Payer: Self-pay | Admitting: Internal Medicine

## 2021-10-08 DIAGNOSIS — R059 Cough, unspecified: Secondary | ICD-10-CM | POA: Insufficient documentation

## 2021-10-08 MED ORDER — PREDNISONE 10 MG PO TABS: ORAL_TABLET | ORAL | 1 refills | Status: DC

## 2021-10-08 MED ORDER — AMOXICILLIN-POT CLAVULANATE 875-125 MG PO TABS: 1.0000 | ORAL_TABLET | Freq: Two times a day (BID) | ORAL | 0 refills | Status: AC

## 2021-10-08 NOTE — Patient Instructions (Addendum)
Prednisone 10 mg x 2 until better, then 1 daily x one week then one half x one week and stop ? ?Stop methotrexate until you see your rheumatologist ? ?Augmentin 875 mg take one pill twice daily  X 21days - take at breakfast and supper with large glass of water.  It would help reduce the usual side effects (diarrhea and yeast infections) if you ate cultured yogurt at lunch.  ? ?Please remember to go to the  x-ray department  for your tests - we will call you with the results when they are available  ? ? ?Please schedule a follow up office visit in 6 weeks, call sooner if needed  ?
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
## 2021-10-09 ENCOUNTER — Encounter: Payer: Self-pay | Admitting: Internal Medicine

## 2021-10-09 NOTE — Assessment & Plan Note (Signed)
Onset 2012 ?- MRI sinus 10/28/17 neg  ?- 08/13/2021 max rx for gerd combined with rhinitis/sinusitis/ bronchitis coverage   ?- recurrent cough 09/02/21  ?- 10/08/2021 repeat pred slower taper and  this time augmentin x 21 days then sinus CT if not better and try off MTX  ? ?Pt convinced mtx is the problem but cxr is clear and sputum is purulent esp in am's with clear lungs as well so I'm still strongly favoring Upper airway cough syndrome (previously labeled PNDS),  is so named because it's frequently impossible to sort out how much is  CR/sinusitis with freq throat clearing (which can be related to primary GERD)   vs  causing  secondary (" extra esophageal")  GERD from wide swings in gastric pressure that occur with throat clearing, often  promoting self use of mint and menthol lozenges that reduce the lower esophageal sphincter tone and exacerbate the problem further in a cyclical fashion.  ? ?These are the same pts (now being labeled as having "irritable larynx syndrome" by some cough centers) who not infrequently have a history of having failed to tolerate ace inhibitors,  dry powder inhalers or biphosphonates or report having atypical/extraesophageal reflux symptoms that don't respond to standard doses of PPI  and are easily confused as having aecopd or asthma flares by even experienced allergists/ pulmonologists (myself included).  ? ?>>> rx as above and then regroup in 6 weeks  ? ? ?    ?  ? ?Each maintenance medication was reviewed in detail including emphasizing most importantly the difference between maintenance and prns and under what circumstances the prns are to be triggered using an action plan format where appropriate. ? ?Total time for H and P, chart review, counseling,   device(s) and generating customized AVS unique to this office visit / same day charting = 31 min ?     ?

## 2021-10-10 DIAGNOSIS — F4321 Adjustment disorder with depressed mood: Secondary | ICD-10-CM | POA: Insufficient documentation

## 2021-10-10 NOTE — Assessment & Plan Note (Signed)
Referring to Tracey Wiley  for grief counselling  ?

## 2021-10-13 ENCOUNTER — Telehealth: Payer: Self-pay | Admitting: Internal Medicine

## 2021-10-13 NOTE — Telephone Encounter (Signed)
Pt want home number called when making referral appointment ?

## 2021-10-14 ENCOUNTER — Encounter: Payer: Self-pay | Admitting: Internal Medicine

## 2021-11-05 ENCOUNTER — Ambulatory Visit (INDEPENDENT_AMBULATORY_CARE_PROVIDER_SITE_OTHER): Payer: Medicare HMO | Admitting: Internal Medicine

## 2021-11-05 ENCOUNTER — Encounter: Payer: Self-pay | Admitting: Internal Medicine

## 2021-11-05 VITALS — BP 136/72 | HR 72 | Temp 98.3°F | Ht 65.0 in | Wt 225.8 lb

## 2021-11-05 DIAGNOSIS — F4329 Adjustment disorder with other symptoms: Secondary | ICD-10-CM

## 2021-11-05 DIAGNOSIS — M1990 Unspecified osteoarthritis, unspecified site: Secondary | ICD-10-CM | POA: Diagnosis not present

## 2021-11-05 DIAGNOSIS — R059 Cough, unspecified: Secondary | ICD-10-CM

## 2021-11-05 DIAGNOSIS — M0600 Rheumatoid arthritis without rheumatoid factor, unspecified site: Secondary | ICD-10-CM | POA: Diagnosis not present

## 2021-11-05 NOTE — Progress Notes (Unsigned)
Subjective:  Patient ID: Tracey Wiley, female    DOB: 1955-11-12  Age: 66 y.o. MRN: [id number]  CC: There were no encounter diagnoses.   HPI Tracey Wiley presents for  Chief Complaint  Patient presents with   Follow-up    2 month follow up    1) joint pain:  received a second opinion  on May 16 by [facility]umat[facility] Dr Graylon Good . Note and labs reviewed with patient.  There was no synovitis on exam.  Methotrexate has been stopped because she was on a subtherapeutic dose and experiencing side effects.  Hi resolution chest  CT and PFTs have been  ordered to evaluate cough , but has not received any appointments yet. For those.  Her rheum follow up  is  in July , additional labs  were done May 16.  ESR and CRP were elevated,  ck normal.   CCP and RF negative.  While on   prednisone and augmentin.   X rays of feet and hands were done and were both negative for erosions.   Last dose of prednisone is Sunday June 4 currently her pain is 7/10. Not using any tylenol or tramadol , holding off as long as she can   Still feels fluish/flulike all of the time , since August 2021.  No history of COVID infection , but has had 4 COVID vaccinations,  starting in march 2021      Outpatient Medications Prior to Visit  Medication Sig Dispense Refill   acetaminophen (TYLENOL) 500 MG tablet Take 500 mg by mouth daily as needed for pain.     albuterol (VENTOLIN HFA) 108 (90 Base) MCG/ACT inhaler Inhale 2 puffs into the lungs every 6 (six) hours as needed for wheezing or shortness of breath. 1 each 1   aspirin EC 81 MG tablet Take 81 mg by mouth daily.     benzonatate (TESSALON) 200 MG capsule Take 1 capsule (200 mg total) by mouth 3 (three) times daily as needed for cough. 60 capsule 1   calcium-vitamin D 250-100 MG-UNIT tablet Take 1 tablet by mouth 2 (two) times daily.     chlorhexidine (PERIDEX) 0.12 % solution GARGLE AND SPIT 15 MLS IN THE MOUTH OR THROAT PRN. 473 mL 0   colchicine 0.6 MG tablet TAKE 1  TABLET (0.6 MG TOTAL) BY MOUTH 2 (TWO) TIMES DAILY AS NEEDED. 180 tablet 1   CVS HYDROCORTISONE ANTI-ITCH 0.5 % APPLY TO THE AFFECTED AREA TWICE A DAY 57 g 11   Echinacea 125 MG CAPS Take by mouth.     esomeprazole (NEXIUM) 40 MG capsule Take 1 capsule (40 mg total) by mouth daily at 12 noon. 90 capsule 3   ezetimibe (ZETIA) 10 MG tablet TAKE 1 TABLET BY MOUTH EVERY DAY 90 tablet 1   folic acid (FOLVITE) 1 MG tablet Take 1 tablet by mouth daily.     furosemide (LASIX) 20 MG tablet TAKE 1 TABLET BY MOUTH 4 DAYS PER WEEK AS DIRECTED 48 tablet 3   hyoscyamine (LEVSIN SL) 0.125 MG SL tablet Place 1 tablet (0.125 mg total) under the tongue every 4 (four) hours as needed for cramping. 60 tablet 3   ibuprofen (ADVIL,MOTRIN) 200 MG tablet Take by mouth as needed.     magnesium gluconate (MAGONATE) 500 MG tablet Take 500 mg by mouth 2 (two) times daily.     MELATONIN PO Take 8 mg by mouth at bedtime.     methotrexate (RHEUMATREX) 2.5 MG tablet  Take by mouth.     ondansetron (ZOFRAN ODT) 4 MG disintegrating tablet Take 1 tablet (4 mg total) by mouth every 8 (eight) hours as needed for nausea or vomiting. 20 tablet 0   potassium chloride (KLOR-CON) 10 MEQ tablet Take 1 tablet (10 mEq total) by mouth daily. Take 1 tablet daily, 4 times a week. Take with Lasix (may take extra tab with extra lasix) 90 tablet 3   predniSONE (DELTASONE) 10 MG tablet Prednisone 10 mg  2 each am until better then one x 7 days, then one half daily x one week and off 60 tablet 1   promethazine (PHENERGAN) 12.5 MG tablet Take 1 tablet (12.5 mg total) by mouth every 8 (eight) hours as needed for nausea or vomiting. 20 tablet 0   sotalol (BETAPACE) 80 MG tablet TAKE 1 TABLET BY MOUTH 2 TIMES DAILY. 180 tablet 2   triamterene-hydrochlorothiazide (DYAZIDE) 37.5-25 MG capsule TAKE 1 CAPSULE BY MOUTH EVERY DAY 90 capsule 2   valACYclovir (VALTREX) 1000 MG tablet TAKE 1 TABLET BY MOUTH DAILY FOR SUPPRESSION , OR 5 TIMES DAILY FOR FLARE 35  tablet 2   vitamin C (ASCORBIC ACID) 500 MG tablet Take 500 mg by mouth daily.     zinc gluconate 50 MG tablet Take 50 mg by mouth daily.     No facility-administered medications prior to visit.    Review of Systems;  Patient denies headache, fevers, malaise, unintentional weight loss, skin rash, eye pain, sinus congestion and sinus pain, sore throat, dysphagia,  hemoptysis , cough, dyspnea, wheezing, chest pain, palpitations, orthopnea, edema, abdominal pain, nausea, melena, diarrhea, constipation, flank pain, dysuria, hematuria, urinary  Frequency, nocturia, numbness, tingling, seizures,  Focal weakness, Loss of consciousness,  Tremor, insomnia, depression, anxiety, and suicidal ideation.      Objective:  BP 136/72 (BP Location: Left Arm, Patient Position: Sitting, Cuff Size: Large)   Pulse 72   Temp 98.3 F (36.8 C) (Oral)   Ht '5\' 5"'  (1.651 m)   Wt 225 lb 12.8 oz (102.4 kg)   SpO2 97%   BMI 37.58 kg/m   BP Readings from Last 3 Encounters:  11/05/21 136/72  10/08/21 130/70  09/03/21 138/80    Wt Readings from Last 3 Encounters:  11/05/21 225 lb 12.8 oz (102.4 kg)  10/08/21 225 lb 12.8 oz (102.4 kg)  10/07/21 225 lb 9.6 oz (102.3 kg)    General appearance: alert, cooperative and appears stated age Ears: normal TM's and external ear canals both ears Throat: lips, mucosa, and tongue normal; teeth and gums normal Neck: no adenopathy, no carotid bruit, supple, symmetrical, trachea midline and thyroid not enlarged, symmetric, no tenderness/mass/nodules Back: symmetric, no curvature. ROM normal. No CVA tenderness. Lungs: clear to auscultation bilaterally Heart: regular rate and rhythm, S1, S2 normal, no murmur, click, rub or gallop Abdomen: soft, non-tender; bowel sounds normal; no masses,  no organomegaly Pulses: 2+ and symmetric Skin: Skin color, texture, turgor normal. No rashes or lesions Lymph nodes: Cervical, supraclavicular, and axillary nodes normal.  Lab Results   Component Value Date   HGBA1C 6.0 (H) 08/18/2020   HGBA1C 6.2 (H) 03/20/2020   HGBA1C 6.2 01/23/2019    Lab Results  Component Value Date   CREATININE 0.78 06/03/2021   CREATININE 0.69 03/18/2021   CREATININE 0.83 02/06/2021    Lab Results  Component Value Date   WBC 9.2 06/03/2021   HGB 12.8 06/03/2021   HCT 38.8 06/03/2021   PLT 312.0 06/03/2021   GLUCOSE 100 (  H) 06/03/2021   CHOL 237 (H) 02/16/2021   TRIG 164 (H) 02/16/2021   HDL 45 02/16/2021   LDLDIRECT 136.0 01/23/2019   LDLCALC 159 (H) 02/16/2021   ALT 15 06/03/2021   AST 20 06/03/2021   NA 138 06/03/2021   K 3.7 06/03/2021   CL 100 06/03/2021   CREATININE 0.78 06/03/2021   BUN 14 06/03/2021   CO2 31 06/03/2021   TSH 2.745 03/20/2020   HGBA1C 6.0 (H) 08/18/2020    DG Chest 2 View  Result Date: 10/08/2021 CLINICAL DATA:  Patient with cough. EXAM: CHEST - 2 VIEW COMPARISON:  Chest radiograph 06/03/2021 FINDINGS: The heart size and mediastinal contours are within normal limits. Both lungs are clear. The visualized skeletal structures are unremarkable. IMPRESSION: No active cardiopulmonary disease. Electronically Signed   By: Lovey Newcomer M.D.   On: 10/08/2021 16:33    Assessment & Plan:   Problem List Items Addressed This Visit   None   I spent a total of   minutes with this patient in a face to face visit on the date of this encounter reviewing the last office visit with me on        ,  most recent with patient's cardiologist in    ,  patient'ss diet and eating habits, home blood pressure readings ,  most recent imaging study ,   and post visit ordering of testing and therapeutics.    Follow-up: No follow-ups on file.   Crecencio Mc, MD

## 2021-11-05 NOTE — Patient Instructions (Addendum)
I think you may have a condition called Polymyalgia Rheumatica  (see below) as the cause of your joint and muscle pain.

## 2021-11-07 ENCOUNTER — Encounter: Payer: Self-pay | Admitting: Internal Medicine

## 2021-11-07 NOTE — Assessment & Plan Note (Signed)
Seen by Dr Melvyn Novas.  Her cough initially  resolved after an empiric round of prednisone and augmentin but has returned.  Her [facility] rheumatologist has orderedCT and  PFTS

## 2021-11-07 NOTE — Assessment & Plan Note (Signed)
Persistent.  Awaiting cousnelling.  appt is several months off with Psychology.  counselling given .  Will have her  rtc one month

## 2021-11-07 NOTE — Assessment & Plan Note (Addendum)
RA Diagnosis is now in question after 2nd opinion with Dr Graylon Good at [facility] . Palin films of hands were negative for erosions ;  Evaluation was reviewed with patient today. It's possible that the tru diagnosis is PMR>  Will have her return one week after prednisone taper has been completed. For ESR

## 2021-11-16 ENCOUNTER — Telehealth: Payer: Self-pay | Admitting: Internal Medicine

## 2021-11-16 NOTE — Telephone Encounter (Signed)
FYI

## 2021-11-16 NOTE — Telephone Encounter (Signed)
Pt called stating that her eye doctor will reach out to the provider regarding a discussion she had about her eye. Pt stated that they wanted to schedule her for October but she since she told them about the condition of her eye she stated they will squeeze her in. Pt stated that the her eye doctor act like they do not believe her and she would like for the provider to explain to them.

## 2021-12-04 ENCOUNTER — Other Ambulatory Visit (INDEPENDENT_AMBULATORY_CARE_PROVIDER_SITE_OTHER): Payer: Medicare HMO

## 2021-12-04 DIAGNOSIS — M1990 Unspecified osteoarthritis, unspecified site: Secondary | ICD-10-CM | POA: Diagnosis not present

## 2021-12-04 DIAGNOSIS — Z79899 Other long term (current) drug therapy: Secondary | ICD-10-CM

## 2021-12-04 NOTE — Addendum Note (Signed)
Addended by: Leeanne Rio on: 12/04/2021 02:12 PM   Modules accepted: Orders

## 2021-12-04 NOTE — Addendum Note (Signed)
Addended by: Leeanne Rio on: 12/04/2021 02:13 PM   Modules accepted: Orders

## 2021-12-05 LAB — CBC WITH DIFFERENTIAL/PLATELET
Basophils Absolute: 0.1 10*3/uL (ref 0.0–0.2)
Basos: 1 %
EOS (ABSOLUTE): 0.3 10*3/uL (ref 0.0–0.4)
Eos: 3 %
Hematocrit: 39.3 % (ref 34.0–46.6)
Hemoglobin: 12.8 g/dL (ref 11.1–15.9)
Immature Grans (Abs): 0 10*3/uL (ref 0.0–0.1)
Immature Granulocytes: 0 %
Lymphocytes Absolute: 2.8 10*3/uL (ref 0.7–3.1)
Lymphs: 29 %
MCH: 27.9 pg (ref 26.6–33.0)
MCHC: 32.6 g/dL (ref 31.5–35.7)
MCV: 86 fL (ref 79–97)
Monocytes Absolute: 0.8 10*3/uL (ref 0.1–0.9)
Monocytes: 8 %
Neutrophils Absolute: 5.6 10*3/uL (ref 1.4–7.0)
Neutrophils: 59 %
Platelets: 295 10*3/uL (ref 150–450)
RBC: 4.58 x10E6/uL (ref 3.77–5.28)
RDW: 14.7 % (ref 11.7–15.4)
WBC: 9.6 10*3/uL (ref 3.4–10.8)

## 2021-12-05 LAB — COMPREHENSIVE METABOLIC PANEL
ALT: 15 IU/L (ref 0–32)
AST: 20 IU/L (ref 0–40)
Albumin/Globulin Ratio: 1.2 (ref 1.2–2.2)
Albumin: 3.8 g/dL (ref 3.8–4.8)
Alkaline Phosphatase: 81 IU/L (ref 44–121)
BUN/Creatinine Ratio: 21 (ref 12–28)
BUN: 15 mg/dL (ref 8–27)
Bilirubin Total: 0.3 mg/dL (ref 0.0–1.2)
CO2: 23 mmol/L (ref 20–29)
Calcium: 9 mg/dL (ref 8.7–10.3)
Chloride: 102 mmol/L (ref 96–106)
Creatinine, Ser: 0.71 mg/dL (ref 0.57–1.00)
Globulin, Total: 3.1 g/dL (ref 1.5–4.5)
Glucose: 76 mg/dL (ref 70–99)
Potassium: 4.2 mmol/L (ref 3.5–5.2)
Sodium: 144 mmol/L (ref 134–144)
Total Protein: 6.9 g/dL (ref 6.0–8.5)
eGFR: 95 mL/min/{1.73_m2} (ref >59)

## 2021-12-05 LAB — SEDIMENTATION RATE: Sed Rate: 28 mm/hr (ref 0–40)

## 2021-12-05 LAB — C-REACTIVE PROTEIN: CRP: 29 mg/L — ABNORMAL HIGH (ref 0–10)

## 2021-12-10 ENCOUNTER — Encounter: Payer: Self-pay | Admitting: Internal Medicine

## 2021-12-10 ENCOUNTER — Ambulatory Visit (INDEPENDENT_AMBULATORY_CARE_PROVIDER_SITE_OTHER): Payer: Medicare HMO | Admitting: Internal Medicine

## 2021-12-10 DIAGNOSIS — J329 Chronic sinusitis, unspecified: Secondary | ICD-10-CM

## 2021-12-10 DIAGNOSIS — M1990 Unspecified osteoarthritis, unspecified site: Secondary | ICD-10-CM
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
## 2021-12-10 NOTE — Patient Instructions (Addendum)
Your Chest CT suggests you may have emhysema or asthma  The PFTs will provide the data to decide.    If emphysema is suggested ,  an alpha 1 antitrypsin deficiency should be ruled out with a blood test   You may also have polymyalgia rheumatica, which is treated with prednisone that is tapered off very gradually  The Sinus CT is what you need to evaluate for chronic sinusitis

## 2021-12-13 DIAGNOSIS — J329 Chronic sinusitis, unspecified: Secondary | ICD-10-CM | POA: Insufficient documentation

## 2021-12-13 NOTE — Assessment & Plan Note (Signed)
She has had a second opinion by Rheumatology at [facility] regarding her diagnosis.  In the absence of synovitis and presence of elevated ESR/CRP,  PMR may be considered.  Has follow up with  [facility] Dr. Wilder Glade next week.

## 2021-12-13 NOTE — Assessment & Plan Note (Signed)
Symptoms improved with a 3 week course of antibitoics,  But symptoms have returned 3 weeks of therapy was completed. CT sinuses  needed

## 2021-12-14 ENCOUNTER — Encounter: Payer: Self-pay | Admitting: Cardiovascular Disease

## 2021-12-14 NOTE — Telephone Encounter (Signed)
error 

## 2021-12-15 ENCOUNTER — Encounter: Payer: Self-pay | Admitting: Cardiovascular Disease

## 2021-12-15 ENCOUNTER — Ambulatory Visit: Payer: Medicare HMO | Admitting: Cardiovascular Disease

## 2021-12-15 VITALS — BP 120/80 | HR 54 | Ht 65.0 in | Wt 226.0 lb

## 2021-12-15 DIAGNOSIS — R7303 Prediabetes: Secondary | ICD-10-CM | POA: Diagnosis not present

## 2021-12-15 DIAGNOSIS — I471 Supraventricular tachycardia: Secondary | ICD-10-CM | POA: Diagnosis not present

## 2021-12-15 DIAGNOSIS — M791 Myalgia, unspecified site: Secondary | ICD-10-CM

## 2021-12-15 DIAGNOSIS — M353 Polymyalgia rheumatica: Secondary | ICD-10-CM

## 2021-12-15 DIAGNOSIS — M1990 Unspecified osteoarthritis, unspecified site: Secondary | ICD-10-CM

## 2021-12-15 DIAGNOSIS — E785 Hyperlipidemia, unspecified: Secondary | ICD-10-CM | POA: Diagnosis not present

## 2021-12-15 DIAGNOSIS — T466X5A Adverse effect of antihyperlipidemic and antiarteriosclerotic drugs, initial encounter: Secondary | ICD-10-CM

## 2021-12-15 NOTE — Patient Instructions (Addendum)
Medication Instructions:  No changes  If you need a refill on your cardiac medications before your next appointment, please call your pharmacy.   Lab work: Your physician recommends that you return for a FASTING lipid profile and A1c:   - You will need to be fasting. Please do not have anything to eat or drink after midnight the morning you have the lab work. You may only have water or black coffee with no cream or sugar.   - Please go to the [facility]. You will check in at the front desk to the right as you walk into the atrium. Valet Parking is offered if needed. - No appointment needed. You may go any day between 7 am and 6 pm.    Testing/Procedures: No new testing needed  Follow-Up: At [facility], you and your health needs are our priority.  As part of our continuing mission to provide you with exceptional heart care, we have created designated Provider Care Teams.  These Care Teams include your primary Cardiologist (physician) and Advanced Practice Providers (APPs -  Physician Assistants and Nurse Practitioners) who all work together to provide you with the care you need, when you need it.  You will need a follow up appointment in 12 months  Providers on your designated Care Team:   Murray Hodgkins, NP Christell Faith, PA-C Cadence Kathlen Mody, [place]  COVID-19 Vaccine Information can be found at: [email or web address] For questions related to vaccine distribution or appointments, please email [email or web address] or call [phone].
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
## 2021-12-18 ENCOUNTER — Encounter: Payer: Self-pay | Admitting: Internal Medicine

## 2021-12-18 ENCOUNTER — Ambulatory Visit: Payer: Medicare HMO | Admitting: Internal Medicine

## 2021-12-18 DIAGNOSIS — R059 Cough, unspecified: Secondary | ICD-10-CM

## 2021-12-18 MED ORDER — ALBUTEROL SULFATE HFA 108 (90 BASE) MCG/ACT IN AERS: INHALATION_SPRAY | RESPIRATORY_TRACT | 11 refills | Status: DC

## 2021-12-18 NOTE — Patient Instructions (Addendum)
My office will be contacting you by phone for referral to sinus CT   For drainage / throat tickle try take CHLORPHENIRAMINE  4 mg  ("Allergy Relief" '4mg'$   at [facility] should be easiest to find in the blue box usually on bottom shelf)  take one every 4 hours as needed - extremely effective and inexpensive over the counter- may cause drowsiness so start with just a dose or two an hour before bedtime and see how you tolerate it before trying in daytime.    For cough > take tessalon as needed and ok to try albuterol 2 puff every 4 hours as needed   Please schedule a follow up office visit in 6 weeks, call sooner if needed with all medications /inhalers/ solutions in hand so we can verify exactly what you are taking. This includes all medications from all doctors and over the counters
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
## 2021-12-19 ENCOUNTER — Encounter: Payer: Self-pay | Admitting: Internal Medicine

## 2021-12-19 NOTE — Assessment & Plan Note (Addendum)
Onset 2012 - MRI sinus 10/28/17 neg  - 08/13/2021 max rx for gerd combined with rhinitis/sinusitis/ bronchitis coverage   - recurrent cough 09/02/21  - 10/08/2021 repeat pred taper same but this time augmentin x 21 days then sinus CT if not better and try off MTX >  Better while on augmentin/ prednisone - HRCT  11/24/21 The lungs are clear. No interstitial lung disease or bronchiectasis.  The central airways are patent. Expiratory scans show mild bilateral air trapping.  PFTS  11/24/21  wnl - Sinus CT 12/18/2021 >>>  - 12/18/2021 added 1st gen H1 blockers per guidelines  And prn saba   The reported response to pred / abx with nl exam and HRCT(no bronchiectasis)  suggest possibility of sinus dz either infection or allergy (since giving both abx and pred at same time hard to be sure) .  If sinus ct neg will not rec any further abx and if needs pred anyway for PMR we''ll see how she does with just the prednisone and prn saba in case of component of cough variant asthma (nl pfts don't rule this out, would need Methacholine challenge/ allergy testing next if not getting anywhere with empirical rx/ explained to pt  - The proper method of use, as well as anticipated side effects, of a metered-dose inhaler were discussed and demonstrated to the patient using teach back method.           Each maintenance medication was reviewed in detail including emphasizing most importantly the difference between maintenance and prns and under what circumstances the prns are to be triggered using an action plan format where appropriate.  Total time for H and P, chart review, counseling, reviewing hfa device(s) and generating customized AVS unique to this office visit / same day charting > 30 min for    refractory respiratory  symptoms of uncertain etiology

## 2021-12-22 ENCOUNTER — Ambulatory Visit
Admission: RE | Admit: 2021-12-22 | Discharge: 2021-12-22 | Disposition: A | Discharge status: Home / Self Care | Payer: Medicare HMO | Source: Ambulatory Visit | Attending: Internal Medicine | Admitting: Internal Medicine

## 2021-12-22 ENCOUNTER — Other Ambulatory Visit
Admission: RE | Admit: 2021-12-22 | Discharge: 2021-12-22 | Disposition: A | Discharge status: Home / Self Care | Payer: Medicare HMO | Source: Home / Self Care | Attending: Cardiovascular Disease | Admitting: Cardiovascular Disease

## 2021-12-22 DIAGNOSIS — E785 Hyperlipidemia, unspecified: Secondary | ICD-10-CM

## 2021-12-22 DIAGNOSIS — R059 Cough, unspecified: Secondary | ICD-10-CM | POA: Insufficient documentation

## 2021-12-22 DIAGNOSIS — R7303 Prediabetes: Secondary | ICD-10-CM | POA: Diagnosis not present

## 2021-12-22 LAB — LIPID PANEL
Cholesterol: 226 mg/dL — ABNORMAL HIGH (ref 0–200)
HDL: 43 mg/dL (ref >40)
LDL Cholesterol: 151 mg/dL — ABNORMAL HIGH (ref 0–99)
Total CHOL/HDL Ratio: 5.3 RATIO
Triglycerides: 159 mg/dL — ABNORMAL HIGH (ref <150)
VLDL: 32 mg/dL (ref 0–40)

## 2021-12-22 LAB — HEMOGLOBIN A1C
Hgb A1c MFr Bld: 5.8 % — ABNORMAL HIGH (ref 4.8–5.6)
Mean Plasma Glucose: 119.76 mg/dL

## 2021-12-23 ENCOUNTER — Telehealth: Payer: Self-pay | Admitting: Emergency Medicine

## 2021-12-23 NOTE — Telephone Encounter (Signed)
-----   Message from Minna Merritts, MD sent at 12/23/2021  2:17 PM EDT ----- Lab work reviewed A1c stable, slow improvement now less than 6 Cholesterol remains elevated, would consider Repatha/Praluent We can send in prescription if desired

## 2021-12-23 NOTE — Telephone Encounter (Signed)
Called patient, went over results and recommendations. Pt verbalized understanding, and questions, if any, were answered.   Pt states that she will think about the Repatha, but doesn't want to make a decision right now.

## 2022-01-07 ENCOUNTER — Ambulatory Visit (INDEPENDENT_AMBULATORY_CARE_PROVIDER_SITE_OTHER): Payer: Medicare HMO | Admitting: Clinical

## 2022-01-07 ENCOUNTER — Other Ambulatory Visit: Payer: Self-pay | Admitting: Internal Medicine

## 2022-01-07 DIAGNOSIS — F4321 Adjustment disorder with depressed mood: Secondary | ICD-10-CM | POA: Diagnosis not present
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
## 2022-01-26 ENCOUNTER — Ambulatory Visit (INDEPENDENT_AMBULATORY_CARE_PROVIDER_SITE_OTHER): Payer: Medicare HMO | Admitting: Internal Medicine

## 2022-01-26 ENCOUNTER — Encounter: Payer: Self-pay | Admitting: Internal Medicine

## 2022-01-26 VITALS — BP 136/74 | HR 66 | Temp 98.5°F | Ht 65.0 in | Wt 228.2 lb

## 2022-01-26 DIAGNOSIS — M353 Polymyalgia rheumatica: Secondary | ICD-10-CM | POA: Diagnosis not present

## 2022-01-26 MED ORDER — ZOSTER VAC RECOMB ADJUVANTED 50 MCG/0.5ML IM SUSR: 0.5000 mL | Freq: Once | INTRAMUSCULAR | 0 refills | Status: AC

## 2022-01-26 MED ORDER — SULFAMETHOXAZOLE-TRIMETHOPRIM 800-160 MG PO TABS
1.0000 | ORAL_TABLET | Freq: Two times a day (BID) | ORAL | 0 refills | Status: DC
Start: 2022-01-26 — End: 2022-02-11

## 2022-01-26 NOTE — Assessment & Plan Note (Signed)
Suspected by lack of polyarthritis and serologies .  Checking repeat CRP and ESR .  If elevated,  Will begin prednisone 20 mg daily   Lab Results  Component Value Date   ESRSEDRATE 28 12/04/2021   Lab Results  Component Value Date   CRP 29 (H) 12/04/2021
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
## 2022-01-26 NOTE — Patient Instructions (Addendum)
CRP and ESR today  If elevated (either),  we will start prednisone at 20 mg daily   and stay on this dose  until your pain has improved.   Muscle pain should improve by the end of the week.

## 2022-01-27 LAB — SEDIMENTATION RATE: Sed Rate: 64 mm/hr — ABNORMAL HIGH (ref 0–30)

## 2022-01-27 LAB — C-REACTIVE PROTEIN: CRP: 2.2 mg/dL (ref 0.5–20.0)

## 2022-01-29 ENCOUNTER — Ambulatory Visit (INDEPENDENT_AMBULATORY_CARE_PROVIDER_SITE_OTHER): Payer: Medicare HMO | Admitting: Clinical

## 2022-01-29 ENCOUNTER — Encounter: Payer: Self-pay | Admitting: Internal Medicine

## 2022-01-29 DIAGNOSIS — F4322 Adjustment disorder with anxiety: Secondary | ICD-10-CM | POA: Diagnosis not present

## 2022-01-29 NOTE — Progress Notes (Addendum)
Time: 4:00pm-5:00pm CPT Code: 35573U-20 Diagnosis: F43.22  Brynley was seen remotely using secure video conferencing for individual therapy. Video was not available due to technical difficulties. She was in her home and therapist was in her office. Session began by completing her treatment plan. 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 Therapist offered communication strategies for healthy boundary setting with family members who invite him to events with the expectation that Layloni will transport him and provide his care at the event. She is scheduled to be seen again in two weeks.  Treatment Plan Client Abilities/Strengths Skyrah shared that she has participated in therapy in the past and has been very honest in session.  Client Treatment Preferences:  Jo-Anne prefers in person appointments. She prefers Tuesday and Thursday afternoon appointments. Client Statement of Needs  Mckaylee is seeking validation of her emotional responses and overall self-concept. Treatment Level  Weekly   Symptoms Tearfulness, irritability Problems Addressed  Goals Mollye will reduce symptoms of depression and improve overall mood 1. Braidyn will increase comfort in social situations Objective  Target Date: 01/08/2023 Frequency: Weekly  Progress: 0 Modality: Individual therapy  Objective  Target Date: 01/07/2023 Frequency: Weekly  Progress: 0 Modality: individual therapy  Related Interventions  Ameriah will have opportunities to process her experiences in session  Therapist will point out maladaptive thought and behavior patterns using CBT strategies. Therapist will incorporate behavior activation as appropriate. Therapist will incorporate gradual exposure therapy as appropriate. Therapist will provide referrals for additional resource as appropriate.  Diagnosis Axis none Adjustment Disorder with Anxiety and Depression (F43.23)   Axis none    Conditions For Discharge Achievement  of treatment goals and objectives              [name], PhD               [name], PhD

## 2022-02-01 ENCOUNTER — Other Ambulatory Visit: Payer: Self-pay | Admitting: Cardiovascular Disease

## 2022-02-01 DIAGNOSIS — E785 Hyperlipidemia, unspecified: Secondary | ICD-10-CM

## 2022-02-10 ENCOUNTER — Telehealth: Payer: Self-pay

## 2022-02-10 NOTE — Telephone Encounter (Signed)
Patient states she is following-up on her previous note regarding prednisone.  Patient states she is ready for Dr. Deborra Medina to call in her prescription for prednisone.  Patient states she has enough medication for tomorrow morning.  Patient states she would like to pick this medication up from her pharmacy tomorrow afternoon (02/11/2022).  *Patient states [facility] preferred [facility] in [place].

## 2022-02-11 ENCOUNTER — Ambulatory Visit: Payer: Medicare HMO | Admitting: Internal Medicine

## 2022-02-11 ENCOUNTER — Other Ambulatory Visit: Payer: Self-pay | Admitting: Cardiovascular Disease

## 2022-02-11 ENCOUNTER — Encounter: Payer: Self-pay | Admitting: Internal Medicine

## 2022-02-11 DIAGNOSIS — R059 Cough, unspecified: Secondary | ICD-10-CM | POA: Diagnosis not present

## 2022-02-11 NOTE — Assessment & Plan Note (Signed)
Onset 2012 - MRI sinus 10/28/17 neg  - 08/13/2021 max rx for gerd combined with rhinitis/sinusitis/ bronchitis coverage   - recurrent cough 09/02/21  - 10/08/2021 repeat pred taper same but this time augmentin x 21 days then sinus CT if not better and try off MTX >  Better while on augmentin/ prednisone - HRCT  11/24/21 The lungs are clear. No interstitial lung disease or bronchiectasis.  The central airways are patent. Expiratory scans show mild bilateral air trapping.  PFTS  11/24/21  wnl - Sinus CT  12/22/21  Trace mucosal thickening in the maxillary sinuses. Otherwise, clear sinuses with no layering fluid to suggest acute sinusitis. - 12/18/2021 added 1st gen H1 blockers per guidelines> could not tol  - 01/30/22 started with pred for presumed pmr > all resp symptoms resolved w/in a week   streroid responsive ? Dx > consider seeking second opinion from [facility] pulmonary as [facility] rheum still thinks she has a pulmonary dx that has escaped me at this point.  I am concerned about In the setting of respiratory symptoms of unknown etiology, using non specific BB like sotalol, but the amt of airflow she has was negligible even off streroids  Advised about how to avoid wt gain on steroids  F/u in 3 m, sooner if needed         Each maintenance medication was reviewed in detail including emphasizing most importantly the difference between maintenance and prns and under what circumstances the prns are to be triggered using an action plan format where appropriate.  Total time for H and P, chart review, counseling,   and generating customized AVS unique to this office visit / same day charting = 25 min
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
## 2022-02-11 NOTE — Telephone Encounter (Signed)
Pt is calling about an update on medication. [facility]  has not heard back from the office

## 2022-02-11 NOTE — Patient Instructions (Signed)
No change in recommendations   Keep working on maintaining neg calorie balance to prevent wt gain from prednisone   I encourage a 2nd opinion from [facility] pulmonary at your rheumatologist's discretion.   Please schedule a follow up visit in 3 months but call sooner if needed

## 2022-02-12 ENCOUNTER — Ambulatory Visit (INDEPENDENT_AMBULATORY_CARE_PROVIDER_SITE_OTHER): Payer: Medicare HMO | Admitting: Clinical

## 2022-02-12 ENCOUNTER — Other Ambulatory Visit: Payer: Self-pay | Admitting: Internal Medicine

## 2022-02-12 ENCOUNTER — Other Ambulatory Visit: Payer: Self-pay

## 2022-02-12 ENCOUNTER — Telehealth: Payer: Self-pay

## 2022-02-12 ENCOUNTER — Telehealth: Payer: Self-pay | Admitting: Internal Medicine

## 2022-02-12 DIAGNOSIS — M353 Polymyalgia rheumatica: Secondary | ICD-10-CM

## 2022-02-12 DIAGNOSIS — F331 Major depressive disorder, recurrent, moderate: Secondary | ICD-10-CM

## 2022-02-12 MED ORDER — PREDNISONE 20 MG PO TABS: 20.0000 mg | ORAL_TABLET | Freq: Every day | ORAL | 0 refills | Status: DC

## 2022-02-12 MED ORDER — PREDNISONE 20 MG PO TABS: 30.0000 mg | ORAL_TABLET | Freq: Every day | ORAL | 0 refills | Status: DC

## 2022-02-12 NOTE — Telephone Encounter (Signed)
Spoke to Patient about the Prednisone and Dr. Lupita Dawn recommendations. Patient verbalized understanding and is agreeable.

## 2022-02-12 NOTE — Telephone Encounter (Signed)
-----  Message from Crecencio Mc, MD sent at 02/12/2022  1:26 PM EDT ----- The rx for 20 mg was sent in incorrectly. . I have sent in the corrected rx for  30 mg daily dose,  she needs to take 1.5 tablets daily until joint pain resolves. .  When the joint pain resolves,  she should schedule a lab appt for ESR/CRP, which have been ordered.   The amount sent in was for 30 days.  Regards,   Deborra Medina, MD        ----- Message ----- From: Suzanna Obey, CMA Sent: 02/12/2022  10:35 AM EDT To: Crecencio Mc, MD  Please let me know how long you would like the Patient to take the 30 mg of Prednisone so I can call it in? Thanks

## 2022-02-12 NOTE — Progress Notes (Signed)
Time: 4:00pm-5:00pm CPT Code: [id number]-13 Diagnosis: F43.22  Tracey Wiley was seen remotely using secure video conferencing for individual therapy. She was in her home and the therapist was in her office at the time of the appointment. Tracey Wiley shared hesitation at boundary setting as pertains to her ex husband, and therapist engaged her in a discussion of the values driving her choice to continue devoting significant time caring for him. She identified loyalty, honesty, and commitment. Through further discussion, she expressed a desire to seek out enjoyment more actively in her day to day life. Therapist encouraged her to continue exploring her values, in order to use them as a framework for decision making. Therapist also provided psychoeducation on the possible value of anger. She is scheduled to be seen again in one week.  Treatment Plan Client Abilities/Strengths Tracey Wiley shared that she has participated in therapy in the past and has been very honest in session.  Client Treatment Preferences:  Tracey Wiley prefers in person appointments. She prefers Tuesday and Thursday afternoon appointments. Client Statement of Needs  Tracey Wiley is seeking validation of her emotional responses and overall self-concept. Treatment Level  Weekly   Symptoms Tearfulness, irritability Problems Addressed  Goals Tracey Wiley will reduce symptoms of depression and improve overall mood 1. Tracey Wiley will increase comfort in social situations Objective  Target Date: 01/08/2023 Frequency: Weekly  Progress: 0 Modality: Individual therapy  Objective  Target Date: 01/07/2023 Frequency: Weekly  Progress: 0 Modality: individual therapy  Related Interventions  Tracey Wiley will have opportunities to process her experiences in session  Therapist will point out maladaptive thought and behavior patterns using CBT strategies. Therapist will incorporate behavior activation as appropriate. Therapist will incorporate gradual exposure therapy as appropriate. Therapist will  provide referrals for additional resource as appropriate.  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

## 2022-02-15 ENCOUNTER — Telehealth: Payer: Self-pay

## 2022-02-15 ENCOUNTER — Encounter: Payer: Self-pay | Admitting: Emergency Medicine

## 2022-02-15 ENCOUNTER — Telehealth: Payer: Self-pay | Admitting: Internal Medicine

## 2022-02-15 ENCOUNTER — Ambulatory Visit
Admission: EM | Admit: 2022-02-15 | Discharge: 2022-02-15 | Disposition: A | Discharge status: Home / Self Care | Payer: Medicare HMO | Attending: Urgent Care | Admitting: Urgent Care

## 2022-02-15 DIAGNOSIS — Z20822 Contact with and (suspected) exposure to covid-19: Secondary | ICD-10-CM | POA: Diagnosis not present

## 2022-02-15 DIAGNOSIS — J069 Acute upper respiratory infection, unspecified: Secondary | ICD-10-CM | POA: Diagnosis not present

## 2022-02-15 LAB — RESP PANEL BY RT-PCR (RSV, FLU A&B, COVID)  RVPGX2
Influenza A by PCR: NEGATIVE
Influenza B by PCR: NEGATIVE
Resp Syncytial Virus by PCR: NEGATIVE
SARS Coronavirus 2 by RT PCR: NEGATIVE

## 2022-02-15 NOTE — Discharge Instructions (Signed)
Physical exam today was unremarkable other than your persistent cough.  Your lungs are clear and there is no sign of pneumonia.  If your symptoms are caused by an infectious process, they are likely viral and will be self-limiting.    Please follow-up with your primary care provider and/or pulmonology if your symptoms do not resolve.

## 2022-02-15 NOTE — Telephone Encounter (Signed)
Benjamine Mola called back from Access Nurse to state patient was triaged and received ED outcome.  Benjamine Mola states patient refused and wants to be seen in office first, but will go to urgent care or ED if PCP tells her to.  Benjamine Mola states patient spoke with her pulmonologist this morning and was told to go to urgent care.  Benjamine Mola states patient started coughing on Saturday (02/13/2022) and it has gotten worse.  Had to use inhaler last night for the first time in 29 years.  Cough is productive with white, foamy, frothy sputum.  Benjamine Mola states patient is having chest pains when coughing (8 out of 10) in middle of her chest.  Benjamine Mola states patient states, "she sounds like a goose honking when she coughs".  Benjamine Mola states patient is on prednisone for autoimmune disorder.  Benjamine Mola states patient started taking Tessalone Pearls yesterday for cough.  I spoke with patient and she states she does not want to go to hospital because she gets sicker when she goes.  Patient states she is planning to go to urgent care today, but she would like to know if Dr. Deborra Medina has any thoughts about anything else she could try.  Patient states she is not having chest pains, but a burning sensation, only when she coughs, between her breasts and the stuff she is coughing up is burning, white, foamy, and sticky.  Patient states cough was dry & hacking until 3:30am this morning.  Patient states prednisone has helped this in the past, but she is already taking it.  Patient wants to rule out RSV & flu.  Patient was negative for covid last night.
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
## 2022-02-15 NOTE — Telephone Encounter (Signed)
Called and spoke with patient. She verbalized understanding.   Nothing further needed.  

## 2022-02-15 NOTE — ED Triage Notes (Signed)
Patient c/o non productive to productive cough x 2 days.   Patient denies fever at home.   Patient endorses "chunky amber" sputum.   Patient endorses "burning" with cough.   Patient has assessed her chest and states " I have crackling" per patient statement.   Patient has been taking Prednisone " for the past 13 days".   Patient has taken Tessalon Pearls and Albuterol Inhaler with no relief.   History of Bronchitis.

## 2022-02-15 NOTE — Telephone Encounter (Signed)
Pt is scheduled with Dr. Walsh tomorrow. 

## 2022-02-15 NOTE — Telephone Encounter (Signed)
Since this is happening on very high doses of prednisone would go to ER or UC today to be evaluated as I don't have any other ideas to try over the phone

## 2022-02-15 NOTE — Telephone Encounter (Signed)
Spoke to patient.  On Saturday she developed a prod cough with white sputum, chest burning and nasal congestion. Cough worsens at night. She has been unable to get a full night rest. Denies f/c/s or additional sx.  She used albuterol HFA last night. She stated that 'albuterol made her feel crazy'. She felt as if she was on speed. She is not using OTC sx to help with sx. She is concerned that she has RVS. As sx are similar to when she had it two years ago. Negative home covid test last night.  Currently on '30mg'$  of prednisone.    Dr. Melvyn Novas, please advise.

## 2022-02-15 NOTE — Telephone Encounter (Signed)
Patient called, she has a bad cough that has changed to a wet cough and has moved to her chest. Patient states she tested negative for COVID. Cough came on fast, no other systems. Patient was given the next available appointment, 02/16/2022. Patient was sent to Access Nurse to make sure she could wait until tomorrow. Patient has had RSV in the past and felt it may be that.

## 2022-02-16 ENCOUNTER — Telehealth (INDEPENDENT_AMBULATORY_CARE_PROVIDER_SITE_OTHER): Payer: Medicare HMO | Admitting: Family Medicine

## 2022-02-16 ENCOUNTER — Encounter: Payer: Self-pay | Admitting: Family Medicine

## 2022-02-16 ENCOUNTER — Ambulatory Visit (INDEPENDENT_AMBULATORY_CARE_PROVIDER_SITE_OTHER): Payer: Medicare HMO

## 2022-02-16 VITALS — BP 133/79 | Temp 99.7°F | Ht 65.0 in | Wt 225.0 lb

## 2022-02-16 DIAGNOSIS — R051 Acute cough: Secondary | ICD-10-CM

## 2022-02-16 DIAGNOSIS — R059 Cough, unspecified: Secondary | ICD-10-CM | POA: Diagnosis not present

## 2022-02-16 MED ORDER — AMOXICILLIN-POT CLAVULANATE 875-125 MG PO TABS: 1.0000 | ORAL_TABLET | Freq: Two times a day (BID) | ORAL | 0 refills | Status: AC

## 2022-02-16 NOTE — Progress Notes (Addendum)
[facility] Telemedicine Visit  Patient consented to have virtual visit and was identified by name and date of birth. Method of visit: Video  Encounter participants: Patient: Tracey Wiley - located at home Provider: Carollee Leitz - located at office Others (if applicable): none  Chief Complaint: cough  HPI:  Patient reports history of cough that occurs this time every year.  Usually treated with Prednisone and Augmentin along with her inhaler.  Reports she went to Pulmonologist appointment on and things went well.  A few days later developed dry hacking cough.  Denies any fevers, chest pian or shortness of breath.  She reports that she went to urgent care as she was scared of having such a severe cough.  At the Urgent care, she was negative for COVID, Flu A/B and RSV.  She reports that her cough has now worsened and has become productive. Sputum has transitioned from white in color to now green.  Endorses and burning sensation in chest when couching now.  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  No increased work of breathing.  Intermittent hacking cough during conversation.  Assessment/Plan:  Cough in adult c/w upper airway cough syndrome Chronic cough.  Recent acute onset.  Negative COVID, Flu, RSV 09/11 -Chest xray today -Sputum for culture -Continue Prednisone as previously ordered -Continue Albuterol inhaler as needed -Start Augmentin 875-125 mg BID x 5 days -Start Probiotics daily and continue for 2 weeks after completion of antibiotics -Follow up with PCP if no improvement -Strict return precautions provided    Time spent during visit with patient: 20 minutes

## 2022-02-16 NOTE — Patient Instructions (Addendum)
It was a pleasure meeting you today. Thank you for allowing me to take part in your health care.  Our goals for today as we discussed include:  For your cough Continue Mucinex Continue Prednisone 30 mg   Please follow-up with PCP as  If you have any questions or concerns, please do not hesitate to call the office at [phone].  I look forward to our next visit and until then take care and stay safe.  Regards,   Carollee Leitz, MD   [facility]

## 2022-02-18 ENCOUNTER — Telehealth: Payer: Self-pay

## 2022-02-18 NOTE — Telephone Encounter (Signed)
Pt returning call

## 2022-02-18 NOTE — Telephone Encounter (Signed)
LMTCB

## 2022-02-18 NOTE — Telephone Encounter (Signed)
Spoke to the Patient with chest xray results. Patient is taking antibiotic and getting better.

## 2022-02-18 NOTE — Telephone Encounter (Signed)
-----   Message from Carollee Leitz, MD sent at 02/17/2022  5:23 PM EDT ----- No pneumonia seen on chest xray.  Did show OA of the thoracic spine as well as the PFO occlusive device.

## 2022-02-19 ENCOUNTER — Ambulatory Visit (INDEPENDENT_AMBULATORY_CARE_PROVIDER_SITE_OTHER): Payer: Medicare HMO | Admitting: Clinical

## 2022-02-19 DIAGNOSIS — F4322 Adjustment disorder with anxiety: Secondary | ICD-10-CM | POA: Diagnosis not present

## 2022-02-19 NOTE — Progress Notes (Signed)
Time: 3:00pm-3:57pm CPT Code: [id number]-29 Diagnosis: F43.22  Senora was seen remotely using secure video conferencing for individual therapy. She was in her home and the therapist was in her office at the time of the appointment. Session focused on Zaynah's grief following the loss of her sister, father, two best friends, mother, and four pets in the past 15 years. Therapist provided validation and support, and suggested that Janah try an exercise of allowing herself to grieve for a set amount of time each day, prior to engaging in a relaxing activity. She is scheduled to be seen again in one week.  Treatment Plan Client Abilities/Strengths Alyra shared that she has participated in therapy in the past and has been very honest in session.  Client Treatment Preferences:  Solae prefers in person appointments. She prefers Tuesday and Thursday afternoon appointments. Client Statement of Needs  Esmerelda is seeking validation of her emotional responses and overall self-concept. Treatment Level  Weekly   Symptoms Tearfulness, irritability Problems Addressed  Goals Analisia will reduce symptoms of depression and improve overall mood 1. Meleny will increase comfort in social situations Objective  Target Date: 01/08/2023 Frequency: Weekly  Progress: 0 Modality: Individual therapy  Objective  Target Date: 01/07/2023 Frequency: Weekly  Progress: 0 Modality: individual therapy  Related Interventions  Lexianna will have opportunities to process her experiences in session  Therapist will point out maladaptive thought and behavior patterns using CBT strategies. Therapist will incorporate behavior activation as appropriate. Therapist will incorporate gradual exposure therapy as appropriate. Therapist will provide referrals for additional resource as appropriate.  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

## 2022-02-20 ENCOUNTER — Encounter: Payer: Self-pay | Admitting: Family Medicine

## 2022-02-20 NOTE — Assessment & Plan Note (Signed)
Chronic cough.  Recent acute onset.  Negative COVID, Flu, RSV 09/11 -Chest xray today -Sputum for culture -Continue Prednisone as previously ordered -Continue Albuterol inhaler as needed -Start Augmentin 875-125 mg BID x 5 days -Start Probiotics daily and continue for 2 weeks after completion of antibiotics -Follow up with PCP if no improvement -Strict return precautions provided

## 2022-02-26 ENCOUNTER — Ambulatory Visit (INDEPENDENT_AMBULATORY_CARE_PROVIDER_SITE_OTHER): Payer: Medicare HMO | Admitting: Clinical

## 2022-02-26 DIAGNOSIS — F4322 Adjustment disorder with anxiety: Secondary | ICD-10-CM

## 2022-02-26 NOTE — Progress Notes (Signed)
Time: 3:00pm-3:57pm CPT Code: [id number]-58 Diagnosis: F43.22  Tracey Wiley was seen remotely using secure video conferencing for individual therapy. She was in her home and the therapist was in her office at the time of the appointment. Session focused on continuing to process Tracey Wiley's grief, as well as her frustrations with responses from friends and family members. Therapist encouraged her to reflect upon her current relationships to assess the current level of emotional support she receives. She is scheduled to be seen again in two weeks.   Treatment Plan Client Abilities/Strengths Tracey Wiley shared that she has participated in therapy in the past and has been very honest in session.  Client Treatment Preferences:  Tracey Wiley prefers in person appointments. She prefers Tuesday and Thursday afternoon appointments. Client Statement of Needs  Tracey Wiley is seeking validation of her emotional responses and overall self-concept. Treatment Level  Weekly   Symptoms Tearfulness, irritability Problems Addressed  Goals Tracey Wiley will reduce symptoms of depression and improve overall mood 1. Tracey Wiley will increase comfort in social situations Objective  Target Date: 01/08/2023 Frequency: Weekly  Progress: 0 Modality: Individual therapy  Objective  Target Date: 01/07/2023 Frequency: Weekly  Progress: 0 Modality: individual therapy  Related Interventions  Tracey Wiley will have opportunities to process her experiences in session  Therapist will point out maladaptive thought and behavior patterns using CBT strategies. Therapist will incorporate behavior activation as appropriate. Therapist will incorporate gradual exposure therapy as appropriate. Therapist will provide referrals for additional resource as appropriate.  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

## 2022-03-05 ENCOUNTER — Ambulatory Visit: Payer: Medicare HMO | Admitting: Clinical

## 2022-03-09 ENCOUNTER — Ambulatory Visit (INDEPENDENT_AMBULATORY_CARE_PROVIDER_SITE_OTHER): Payer: Medicare HMO | Admitting: Clinical

## 2022-03-09 DIAGNOSIS — F4322 Adjustment disorder with anxiety: Secondary | ICD-10-CM | POA: Diagnosis not present

## 2022-03-09 NOTE — Progress Notes (Signed)
Time: 1:03 pm- 2:03 pm CPT Code: 39030S-92 Diagnosis: F43.22  Tracey Wiley was seen in person for individual therapy. She reported an improvement in mood and energy, as well as a reduction in pain since prednisone had been initiated 6 weeks prior. Session focused on further processing events that transpired toward the end of her mother's life, as well as events related to the start of her current relationship. She indicated that she and her boyfriend plan to take a trip to the beach in the next two weeks, which she is looking forward to. She is scheduled to be seen again in two weeks.   Treatment Plan Client Abilities/Strengths Tracey Wiley shared that she has participated in therapy in the past and has been very honest in session.  Client Treatment Preferences:  Tracey Wiley prefers in person appointments. She prefers Tuesday and Thursday afternoon appointments. Client Statement of Needs  Tracey Wiley is seeking validation of her emotional responses and overall self-concept. Treatment Level  Weekly   Symptoms Tearfulness, irritability Problems Addressed  Goals Tracey Wiley will reduce symptoms of depression and improve overall mood 1. Tracey Wiley will increase comfort in social situations Objective  Target Date: 01/08/2023 Frequency: Weekly  Progress: 0 Modality: Individual therapy  Objective  Target Date: 01/07/2023 Frequency: Weekly  Progress: 0 Modality: individual therapy  Related Interventions  Tracey Wiley will have opportunities to process her experiences in session  Therapist will point out maladaptive thought and behavior patterns using CBT strategies. Therapist will incorporate behavior activation as appropriate. Therapist will incorporate gradual exposure therapy as appropriate. Therapist will provide referrals for additional resource as appropriate.  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

## 2022-03-11 ENCOUNTER — Ambulatory Visit (INDEPENDENT_AMBULATORY_CARE_PROVIDER_SITE_OTHER): Payer: Medicare HMO

## 2022-03-11 VITALS — Ht 65.0 in | Wt 225.0 lb

## 2022-03-11 DIAGNOSIS — Z0000 Encounter for general adult medical examination without abnormal findings: Secondary | ICD-10-CM | POA: Diagnosis not present

## 2022-03-11 NOTE — Patient Instructions (Addendum)
Tracey Wiley , Thank you for taking time to come for your Medicare Wellness Visit. I appreciate your ongoing commitment to your health goals. Please review the following plan we discussed and let me know if I can assist you in the future.   These are the goals we discussed:  Goals       Patient Stated     Increase physical activity (pt-stated)      Stay active and walk for exercise          This is a list of the screening recommended for you and due dates:  Health Maintenance  Topic Date Due   Pap Smear  01/24/2022   COVID-19 Vaccine (5 - Mixed Product risk series) 03/27/2022*   Zoster (Shingles) Vaccine (1 of 2) 06/11/2022*   Hepatitis C Screening: USPSTF Recommendation to screen - Ages 18-79 yo.  09/04/2022*   HIV Screening  09/04/2022*   Flu Shot  09/05/2022*   Tetanus Vaccine  03/12/2023*   Mammogram  07/08/2022   Colon Cancer Screening  04/25/2024   HPV Vaccine  Aged Out  *Topic was postponed. The date shown is not the original due date.    Advanced directives: End of life planning; Advance aging; Advanced directives discussed.  Copy of current HCPOA/Living Will requested.    Conditions/risks identified: none new.  Next appointment: Follow up in one year for your annual wellness visit.   Preventive Care 40-64 Years, Female Preventive care refers to lifestyle choices and visits with your health care provider that can promote health and wellness. What does preventive care include? A yearly physical exam. This is also called an annual well check. Dental exams once or twice a year. Routine eye exams. Ask your health care provider how often you should have your eyes checked. Personal lifestyle choices, including: Daily care of your teeth and gums. Regular physical activity. Eating a healthy diet. Avoiding tobacco and drug use. Limiting alcohol use. Practicing safe sex. Taking low-dose aspirin daily starting at age 44. Taking vitamin and mineral supplements as  recommended by your health care provider. What happens during an annual well check? The services and screenings done by your health care provider during your annual well check will depend on your age, overall health, lifestyle risk factors, and family history of disease. Counseling  Your health care provider may ask you questions about your: Alcohol use. Tobacco use. Drug use. Emotional well-being. Home and relationship well-being. Sexual activity. Eating habits. Work and work Statistician. Method of birth control. Menstrual cycle. Pregnancy history. Screening  You may have the following tests or measurements: Height, weight, and BMI. Blood pressure. Lipid and cholesterol levels. These may be checked every 5 years, or more frequently if you are over 76 years old. Skin check. Lung cancer screening. You may have this screening every year starting at age 3 if you have a 30-pack-year history of smoking and currently smoke or have quit within the past 15 years. Fecal occult blood test (FOBT) of the stool. You may have this test every year starting at age 68. Flexible sigmoidoscopy or colonoscopy. You may have a sigmoidoscopy every 5 years or a colonoscopy every 10 years starting at age 17. Hepatitis C blood test. Hepatitis B blood test. Sexually transmitted disease (STD) testing. Diabetes screening. This is done by checking your blood sugar (glucose) after you have not eaten for a while (fasting). You may have this done every 1-3 years. Mammogram. This may be done every 1-2 years. Talk to your health  care provider about when you should start having regular mammograms. This may depend on whether you have a family history of breast cancer. BRCA-related cancer screening. This may be done if you have a family history of breast, ovarian, tubal, or peritoneal cancers. Pelvic exam and Pap test. This may be done every 3 years starting at age 23. Starting at age 1, this may be done every 5 years if  you have a Pap test in combination with an HPV test. Bone density scan. This is done to screen for osteoporosis. You may have this scan if you are at high risk for osteoporosis. Discuss your test results, treatment options, and if necessary, the need for more tests with your health care provider. Vaccines  Your health care provider may recommend certain vaccines, such as: Influenza vaccine. This is recommended every year. Tetanus, diphtheria, and acellular pertussis (Tdap, Td) vaccine. You may need a Td booster every 10 years. Zoster vaccine. You may need this after age 43. Pneumococcal 13-valent conjugate (PCV13) vaccine. You may need this if you have certain conditions and were not previously vaccinated. Pneumococcal polysaccharide (PPSV23) vaccine. You may need one or two doses if you smoke cigarettes or if you have certain conditions. Talk to your health care provider about which screenings and vaccines you need and how often you need them. This information is not intended to replace advice given to you by your health care provider. Make sure you discuss any questions you have with your health care provider. Document Released: 06/20/2015 Document Revised: 02/11/2016 Document Reviewed: 03/25/2015 Elsevier Interactive Patient Education  2017 [place] Prevention in the Home Falls can cause injuries. They can happen to people of all ages. There are many things you can do to make your home safe and to help prevent falls. What can I do on the outside of my home? Regularly fix the edges of walkways and driveways and fix any cracks. Remove anything that might make you trip as you walk through a door, such as a raised step or threshold. Trim any bushes or trees on the path to your home. Use bright outdoor lighting. Clear any walking paths of anything that might make someone trip, such as rocks or tools. Regularly check to see if handrails are loose or broken. Make sure that both  sides of any steps have handrails. Any raised decks and porches should have guardrails on the edges. Have any leaves, snow, or ice cleared regularly. Use sand or salt on walking paths during winter. Clean up any spills in your garage right away. This includes oil or grease spills. What can I do in the bathroom? Use night lights. Install grab bars by the toilet and in the tub and shower. Do not use towel bars as grab bars. Use non-skid mats or decals in the tub or shower. If you need to sit down in the shower, use a plastic, non-slip stool. Keep the floor dry. Clean up any water that spills on the floor as soon as it happens. Remove soap buildup in the tub or shower regularly. Attach bath mats securely with double-sided non-slip rug tape. Do not have throw rugs and other things on the floor that can make you trip. What can I do in the bedroom? Use night lights. Make sure that you have a light by your bed that is easy to reach. Do not use any sheets or blankets that are too big for your bed. They should not hang down onto  the floor. Have a firm chair that has side arms. You can use this for support while you get dressed. Do not have throw rugs and other things on the floor that can make you trip. What can I do in the kitchen? Clean up any spills right away. Avoid walking on wet floors. Keep items that you use a lot in easy-to-reach places. If you need to reach something above you, use a strong step stool that has a grab bar. Keep electrical cords out of the way. Do not use floor polish or wax that makes floors slippery. If you must use wax, use non-skid floor wax. Do not have throw rugs and other things on the floor that can make you trip. What can I do with my stairs? Do not leave any items on the stairs. Make sure that there are handrails on both sides of the stairs and use them. Fix handrails that are broken or loose. Make sure that handrails are as long as the stairways. Check any  carpeting to make sure that it is firmly attached to the stairs. Fix any carpet that is loose or worn. Avoid having throw rugs at the top or bottom of the stairs. If you do have throw rugs, attach them to the floor with carpet tape. Make sure that you have a light switch at the top of the stairs and the bottom of the stairs. If you do not have them, ask someone to add them for you. What else can I do to help prevent falls? Wear shoes that: Do not have high heels. Have rubber bottoms. Are comfortable and fit you well. Are closed at the toe. Do not wear sandals. If you use a stepladder: Make sure that it is fully opened. Do not climb a closed stepladder. Make sure that both sides of the stepladder are locked into place. Ask someone to hold it for you, if possible. Clearly mark and make sure that you can see: Any grab bars or handrails. First and last steps. Where the edge of each step is. Use tools that help you move around (mobility aids) if they are needed. These include: Canes. Walkers. Scooters. Crutches. Turn on the lights when you go into a dark area. Replace any light bulbs as soon as they burn out. Set up your furniture so you have a clear path. Avoid moving your furniture around. If any of your floors are uneven, fix them. If there are any pets around you, be aware of where they are. Review your medicines with your doctor. Some medicines can make you feel dizzy. This can increase your chance of falling. Ask your doctor what other things that you can do to help prevent falls. This information is not intended to replace advice given to you by your health care provider. Make sure you discuss any questions you have with your health care provider. Document Released: 03/20/2009 Document Revised: 10/30/2015 Document Reviewed: 06/28/2014 Elsevier Interactive Patient Education  2017 [employer].
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
## 2022-03-12 ENCOUNTER — Encounter: Payer: Self-pay | Admitting: Internal Medicine

## 2022-03-12 ENCOUNTER — Ambulatory Visit (INDEPENDENT_AMBULATORY_CARE_PROVIDER_SITE_OTHER): Payer: Medicare HMO | Admitting: Internal Medicine

## 2022-03-12 VITALS — BP 138/80 | HR 62 | Temp 98.4°F | Ht 65.0 in | Wt 227.2 lb

## 2022-03-12 DIAGNOSIS — I6522 Occlusion and stenosis of left carotid artery: Secondary | ICD-10-CM | POA: Diagnosis not present

## 2022-03-12 DIAGNOSIS — R300 Dysuria: Secondary | ICD-10-CM

## 2022-03-12 DIAGNOSIS — F4329 Adjustment disorder with other symptoms: Secondary | ICD-10-CM

## 2022-03-12 DIAGNOSIS — Z79899 Other long term (current) drug therapy: Secondary | ICD-10-CM | POA: Diagnosis not present

## 2022-03-12 DIAGNOSIS — M353 Polymyalgia rheumatica: Secondary | ICD-10-CM | POA: Diagnosis not present

## 2022-03-12 DIAGNOSIS — Z23 Encounter for immunization: Secondary | ICD-10-CM

## 2022-03-12 LAB — URINALYSIS, ROUTINE W REFLEX MICROSCOPIC
Bilirubin Urine: NEGATIVE
Hgb urine dipstick: NEGATIVE
Ketones, ur: NEGATIVE
Leukocytes,Ua: NEGATIVE
Nitrite: NEGATIVE
RBC / HPF: NONE SEEN (ref 0–?)
Specific Gravity, Urine: 1.015 (ref 1.000–1.030)
Total Protein, Urine: NEGATIVE
Urine Glucose: NEGATIVE
Urobilinogen, UA: 0.2 (ref 0.0–1.0)
WBC, UA: NONE SEEN (ref 0–?)
pH: 7.5 (ref 5.0–8.0)

## 2022-03-12 LAB — BASIC METABOLIC PANEL
BUN: 20 mg/dL (ref 6–23)
CO2: 32 mEq/L (ref 19–32)
Calcium: 9.3 mg/dL (ref 8.4–10.5)
Chloride: 97 mEq/L (ref 96–112)
Creatinine, Ser: 0.96 mg/dL (ref 0.40–1.20)
GFR: 62.67 mL/min (ref >60.00)
Glucose, Bld: 112 mg/dL — ABNORMAL HIGH (ref 70–99)
Potassium: 3.5 mEq/L (ref 3.5–5.1)
Sodium: 137 mEq/L (ref 135–145)

## 2022-03-12 LAB — SEDIMENTATION RATE: Sed Rate: 35 mm/hr — ABNORMAL HIGH (ref 0–30)

## 2022-03-12 LAB — C-REACTIVE PROTEIN: CRP: 1.5 mg/dL (ref 0.5–20.0)

## 2022-03-12 NOTE — Progress Notes (Signed)
Subjective:  Patient ID: Tracey Wiley, female    DOB: 09-20-1955  Age: 66 y.o. MRN: [id number]  CC: The primary encounter diagnosis was Need for immunization against influenza. Diagnoses of Carotid atherosclerosis, left, Polymyalgia rheumatica (HCC), Long-term use of high-risk medication, Dysuria, and Grief reaction with prolonged bereavement were also pertinent to this visit.   HPI [name] presents for follow up on PMR  and other issues.  She has been taking 30 mg prednisone daily since Sept 10.  For 2 weeks after the dose change, she  DEVELOPED A PRODUCTIVE COUGH  ,  saw Dr Volanda Napoleon on Sept 12 after a fruitless  Urgent care visit on sept 11.  Had chest x ray done.  No pneumonia , negative COVID/RSV/FLU.  Improved to dry cough after one week of empiric treatment with  augmentin.  For the last 10 days she has been feeling much better,  with all pain resolved except for intermittent hip pain  on the left side .  The decrease in pain has allowed her to be  more active.  She is averaging  30,000 steps daily in the house,  and is optimistic about resuming a formal exercise program   PMR: as above, with recent referrals made previously  by her rheumatologist to Neuro ophthalmology to  rule out temporal arteritis . However, the referrals were not made urgently, and she was already taking prednisone.  CTA of brain was non diagnostic, and no temporal artery biopsy was deemed necessary or done.   Grief:  she is seeing two therapists to help her cope with the loss of her beloved dog which has been devastating to her. The sessions have also highlighted the stress incurred by the responsilibity she feels in caring for her estranged husband, Tracey Wiley .    Lab Results  Component Value Date   ESRSEDRATE 66 (H) 03/12/2022   Lab Results  Component Value Date   CRP 1.5 03/12/2022   In the interim,  she has had CT angiogram ordered several months ago by Graylon Good to rule out vasculitis.  CTA was negative for  vasculitis. Also saw neuroophthalmologist,  referred by her ophthalmologist for temporal artery biopsy to rule out GCA , was already on prednisone  so no biopsy was done.  Told that her  exam was "unimpressive"    Grief counselling/adjustment disorder : following the loss of her dog Tracey Wiley who died 6 months ago.  She is finally seeing therapists (2) sees Dr.   [name] in [place]  and [name]  here in [place] .  Their approaches are very different but she feels they are both helping and seeiing them both allows her more therapy  . Her recover is complicated by her ongoing care for estranged husband Tracey Wiley (whose sons are not involved)     Outpatient Medications Prior to Visit  Medication Sig Dispense Refill   acetaminophen (TYLENOL) 500 MG tablet Take 500 mg by mouth daily as needed for pain.     albuterol (VENTOLIN HFA) 108 (90 Base) MCG/ACT inhaler Inhale 2 puffs into the lungs every 6 (six) hours as needed for wheezing or shortness of breath. 1 each 1   aspirin EC 81 MG tablet Take 81 mg by mouth daily.     benzonatate (TESSALON) 200 MG capsule Take 1 capsule (200 mg total) by mouth 3 (three) times daily as needed for cough. 60 capsule 1   calcium-vitamin D 250-100 MG-UNIT tablet Take 1 tablet by mouth 2 (two)  times daily.     chlorhexidine (PERIDEX) 0.12 % solution GARGLE AND SPIT 15 MLS IN THE MOUTH OR THROAT PRN. 473 mL 0   colchicine 0.6 MG tablet TAKE 1 TABLET (0.6 MG TOTAL) BY MOUTH 2 (TWO) TIMES DAILY AS NEEDED. 180 tablet 1   [employer] HYDROCORTISONE ANTI-ITCH 0.5 % APPLY TO THE AFFECTED AREA TWICE A DAY 57 g 11   Echinacea 400 MG CAPS Take 1 capsule by mouth daily.     esomeprazole (NEXIUM) 40 MG capsule Take 1 capsule (40 mg total) by mouth daily at 12 noon. 90 capsule 3   ezetimibe (ZETIA) 10 MG tablet TAKE 1 TABLET BY MOUTH EVERY DAY 90 tablet 2   furosemide (LASIX) 20 MG tablet TAKE 1 TABLET BY MOUTH 4 DAYS PER WEEK AS DIRECTED 48 tablet 3   hyoscyamine (LEVSIN SL) 0.125 MG SL  tablet Place 1 tablet (0.125 mg total) under the tongue every 4 (four) hours as needed for cramping. 60 tablet 3   magnesium gluconate (MAGONATE) 500 MG tablet Take 500 mg by mouth at bedtime.     MELATONIN PO Take 8 mg by mouth at bedtime.     ondansetron (ZOFRAN ODT) 4 MG disintegrating tablet Take 1 tablet (4 mg total) by mouth every 8 (eight) hours as needed for nausea or vomiting. 20 tablet 0   potassium chloride (KLOR-CON) 10 MEQ tablet TAKE 1 TABLET (10 MEQ TOTAL) BY MOUTH DAILY. TAKE 1 TABLET DAILY, 4 TIMES A WEEK. TAKE WITH LASIX (MAY TAKE EXTRA TAB WITH EXTRA LASIX) 90 tablet 3   sotalol (BETAPACE) 80 MG tablet TAKE 1 TABLET BY MOUTH TWICE A DAY 180 tablet 2   triamterene-hydrochlorothiazide (DYAZIDE) 37.5-25 MG capsule TAKE 1 CAPSULE BY MOUTH EVERY DAY 90 capsule 2   valACYclovir (VALTREX) 1000 MG tablet TAKE 1 TABLET BY MOUTH DAILY FOR SUPPRESSION , OR 5 TIMES DAILY FOR FLARE 35 tablet 2   vitamin C (ASCORBIC ACID) 500 MG tablet Take 500 mg by mouth daily.     zinc gluconate 50 MG tablet Take 50 mg by mouth daily.     predniSONE (DELTASONE) 20 MG tablet Take 1.5 tablets (30 mg total) by mouth daily with breakfast. 45 tablet 0   albuterol (PROAIR HFA) 108 (90 Base) MCG/ACT inhaler 2 puffs up to every 4 hours as needed only  if your can't catch your breath (Patient not taking: Reported on 03/12/2022) 18 g 11   ibuprofen (ADVIL,MOTRIN) 200 MG tablet Take by mouth as needed. (Patient not taking: Reported on 03/12/2022)     LORazepam (ATIVAN) 0.5 MG tablet Take 0.5 mg by mouth once. (Patient not taking: Reported on 03/12/2022)     No facility-administered medications prior to visit.    Review of Systems;  Patient denies headache, fevers, malaise, unintentional weight loss, skin rash, eye pain, sinus congestion and sinus pain, sore throat, dysphagia,  hemoptysis , cough, dyspnea, wheezing, chest pain, palpitations, orthopnea, edema, abdominal pain, nausea, melena, diarrhea, constipation, flank  pain, dysuria, hematuria, urinary  Frequency, nocturia, numbness, tingling, seizures,  Focal weakness, Loss of consciousness,  Tremor, insomnia, depression, anxiety, and suicidal ideation.      Objective:  BP 138/80 (BP Location: Left Arm, Patient Position: Sitting, Cuff Size: Normal)   Pulse 62   Temp 98.4 F (36.9 C) (Oral)   Ht '5\' 5"'  (1.651 m)   Wt 227 lb 3.2 oz (103.1 kg)   SpO2 97%   BMI 37.81 kg/m   BP Readings from Last 3 Encounters:  03/12/22 138/80  02/16/22 133/79  02/15/22 (!) 146/78    Wt Readings from Last 3 Encounters:  03/12/22 227 lb 3.2 oz (103.1 kg)  03/11/22 225 lb (102.1 kg)  02/16/22 225 lb (102.1 kg)    General appearance: alert, cooperative and appears stated age Ears: normal TM's and external ear canals both ears Throat: lips, mucosa, and tongue normal; teeth and gums normal Neck: no adenopathy, no carotid bruit, supple, symmetrical, trachea midline and thyroid not enlarged, symmetric, no tenderness/mass/nodules Back: symmetric, no curvature. ROM normal. No CVA tenderness. Lungs: clear to auscultation bilaterally Heart: regular rate and rhythm, S1, S2 normal, no murmur, click, rub or gallop Abdomen: soft, non-tender; bowel sounds normal; no masses,  no organomegaly Pulses: 2+ and symmetric Skin: Skin color, texture, turgor normal. No rashes or lesions Lymph nodes: Cervical, supraclavicular, and axillary nodes normal. Neuro:  awake and interactive with normal mood and affect. Higher cortical functions are normal. Speech is clear without word-finding difficulty or dysarthria. Extraocular movements are intact. Visual fields of both eyes are grossly intact. Sensation to light touch is grossly intact bilaterally of upper and lower extremities. Motor examination shows 4+/5 symmetric hand grip and upper extremity and 5/5 lower extremity strength. There is no pronation or drift. Gait is non-ataxic   Lab Results  Component Value Date   HGBA1C 5.8 (H)  12/22/2021   HGBA1C 6.0 (H) 08/18/2020   HGBA1C 6.2 (H) 03/20/2020    Lab Results  Component Value Date   CREATININE 0.96 03/12/2022   CREATININE 0.71 12/04/2021   CREATININE 0.78 06/03/2021    Lab Results  Component Value Date   WBC 9.6 12/04/2021   HGB 12.8 12/04/2021   HCT 39.3 12/04/2021   PLT 295 12/04/2021   GLUCOSE 112 (H) 03/12/2022   CHOL 226 (H) 12/22/2021   TRIG 159 (H) 12/22/2021   HDL 43 12/22/2021   LDLDIRECT 136.0 01/23/2019   LDLCALC 151 (H) 12/22/2021   ALT 15 12/04/2021   AST 20 12/04/2021   NA 137 03/12/2022   K 3.5 03/12/2022   CL 97 03/12/2022   CREATININE 0.96 03/12/2022   BUN 20 03/12/2022   CO2 32 03/12/2022   TSH 2.745 03/20/2020   HGBA1C 5.8 (H) 12/22/2021    DG Chest 2 View  Result Date: 02/17/2022 CLINICAL DATA:  Acute cough EXAM: CHEST - 2 VIEW COMPARISON:  Chest radiograph Oct 08, 2021 FINDINGS: Stable cardiac and mediastinal contours. No large area pulmonary consolidation. No pleural effusion or pneumothorax. Thoracic spine degenerative changes. PFO occlusion device. IMPRESSION: No active cardiopulmonary disease. Electronically Signed   By: Lovey Newcomer M.D.   On: 02/17/2022 13:48    Assessment & Plan:   Problem List Items Addressed This Visit     Grief reaction with prolonged bereavement    Improving with therapy.  Secondary to loss of her beloved dog.       Polymyalgia rheumatica (HCC)    Suspected by lack of polyarthritis and negative serologies for RA/SLE  .  Checking repeat CRP and ESR .all symptoms have improved except for unilateral hip pain,  ESR remains elevated on 30 mg daily.  ,  Will increase dose of  Prednisone to 40 mg daily for 2 weeks,  Then recheck ESR   Lab Results  Component Value Date   ESRSEDRATE 35 (H) 03/12/2022   Lab Results  Component Value Date   CRP 1.5 03/12/2022         Relevant Orders   Sedimentation rate (Completed)  C-reactive protein (Completed)   Carotid atherosclerosis, left   Other  Visit Diagnoses     Need for immunization against influenza    -  Primary   Relevant Orders   Flu Vaccine QUAD 59moIM (Fluarix, Fluzone & Alfiuria Quad PF) (Completed)   Long-term use of high-risk medication       Relevant Orders   Basic metabolic panel (Completed)   Dysuria       Relevant Orders   Urinalysis, Routine w reflex microscopic (Completed)       I spent a total of 30  minutes with this patient in a face to face visit on the date of this encounter reviewing the last office visit with me in  July,   most recent visit with rheumatology and neuro ophthalmology, tient's diet and exercise habits,  and post visit ordering of testing and therapeutics.    Follow-up: Return in about 4 weeks (around 04/09/2022).   TCrecencio Mc MD

## 2022-03-12 NOTE — Patient Instructions (Signed)
Glad you are feeling better!  If the ESR and CRP are normal,  we will reduce your prednisone dose to 25 mg daily and repeat ESR/CRP MONTHLY

## 2022-03-14 MED ORDER — PREDNISONE 20 MG PO TABS
40.0000 mg | ORAL_TABLET | Freq: Every day | ORAL | 0 refills | Status: DC
Start: 2022-03-14 — End: 2022-04-03

## 2022-03-14 NOTE — Assessment & Plan Note (Signed)
Improving with therapy.  Secondary to loss of her beloved dog.

## 2022-03-14 NOTE — Assessment & Plan Note (Addendum)
Suspected by lack of polyarthritis and negative serologies for RA/SLE  .  Checking repeat CRP and ESR .all symptoms have improved except for unilateral hip pain,  ESR remains elevated on 30 mg daily.  ,  Will increase dose of  Prednisone to 40 mg daily for 2 weeks,  Then recheck ESR   Lab Results  Component Value Date   ESRSEDRATE 35 (H) 03/12/2022   Lab Results  Component Value Date   CRP 1.5 03/12/2022

## 2022-03-16 LAB — HM MAMMOGRAPHY

## 2022-03-17 ENCOUNTER — Other Ambulatory Visit: Payer: Self-pay | Admitting: Internal Medicine

## 2022-03-18 NOTE — Telephone Encounter (Signed)
Medication was refilled 4 days ago.

## 2022-03-25 ENCOUNTER — Ambulatory Visit: Payer: Medicare HMO | Admitting: Clinical

## 2022-03-30 ENCOUNTER — Encounter: Payer: Self-pay | Admitting: Internal Medicine

## 2022-03-30 ENCOUNTER — Ambulatory Visit (INDEPENDENT_AMBULATORY_CARE_PROVIDER_SITE_OTHER): Payer: Medicare HMO | Admitting: Clinical

## 2022-03-30 DIAGNOSIS — F4322 Adjustment disorder with anxiety: Secondary | ICD-10-CM

## 2022-03-30 NOTE — Progress Notes (Signed)
Time: 10:03 am- 10:58 am CPT Code: 36067P-03 Diagnosis: F43.22  Tracey Wiley was seen in person for individual therapy. She reported having had a good week at the beach with her partner. Session focused on learning more about her relationship, and how she and her partner met. She is scheduled to be seen again in one week.   Treatment Plan Client Abilities/Strengths Tracey Wiley shared that she has participated in therapy in the past and has been very honest in session.  Client Treatment Preferences:  Tracey Wiley prefers in person appointments. She prefers Tuesday and Thursday afternoon appointments. Client Statement of Needs  Tracey Wiley is seeking validation of her emotional responses and overall self-concept. Treatment Level  Weekly   Symptoms Tearfulness, irritability Problems Addressed  Goals Tracey Wiley will reduce symptoms of depression and improve overall mood 1. Tracey Wiley will increase comfort in social situations Objective  Target Date: 01/08/2023 Frequency: Weekly  Progress: 0 Modality: Individual therapy  Objective  Target Date: 01/07/2023 Frequency: Weekly  Progress: 0 Modality: individual therapy  Related Interventions  Tracey Wiley will have opportunities to process her experiences in session  Therapist will point out maladaptive thought and behavior patterns using CBT strategies. Therapist will incorporate behavior activation as appropriate. Therapist will incorporate gradual exposure therapy as appropriate. Therapist will provide referrals for additional resource as appropriate.  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

## 2022-04-02 ENCOUNTER — Other Ambulatory Visit (INDEPENDENT_AMBULATORY_CARE_PROVIDER_SITE_OTHER): Payer: Medicare HMO

## 2022-04-02 DIAGNOSIS — M353 Polymyalgia rheumatica: Secondary | ICD-10-CM

## 2022-04-02 LAB — SEDIMENTATION RATE: Sed Rate: 28 mm/h (ref 0–30)

## 2022-04-02 NOTE — Addendum Note (Signed)
Addended by: Neta Ehlers on: 04/02/2022 02:04 PM   Modules accepted: Orders

## 2022-04-03 MED ORDER — PREDNISONE 20 MG PO TABS: 30.0000 mg | ORAL_TABLET | Freq: Every day | ORAL | 0 refills | Status: DC

## 2022-04-03 NOTE — Addendum Note (Signed)
Addended by: Crecencio Mc on: 04/03/2022 04:17 PM   Modules accepted: Orders

## 2022-04-03 NOTE — Assessment & Plan Note (Signed)
INR has normalized on 40 mg prednisone taken daily since Oct 8.  Will reduce dose to 30 mg and repeat INR in 2 weeks

## 2022-04-06 ENCOUNTER — Ambulatory Visit (INDEPENDENT_AMBULATORY_CARE_PROVIDER_SITE_OTHER): Payer: Medicare HMO | Admitting: Clinical

## 2022-04-06 DIAGNOSIS — F4322 Adjustment disorder with anxiety: Secondary | ICD-10-CM

## 2022-04-06 NOTE — Progress Notes (Signed)
Time: 1:03 pm- 2:03 pm CPT Code: [id number]-63 Diagnosis: F43.22  Tracey Wiley was seen in person for individual therapy. She reported continuing to do well overall. Session focused on issues that have arisen in several of her friendships. Therapist provided psychoeducation on assertive communication and healthy boundary setting. For homework, she will notice when she becomes irritable with others to gain better understanding of her boundaries. She is scheduled to be seen again in two weeks.  Treatment Plan Client Abilities/Strengths Tracey Wiley shared that she has participated in therapy in the past and has been very honest in session.  Client Treatment Preferences:  Tracey Wiley prefers in person appointments. She prefers Tuesday and Thursday afternoon appointments. Client Statement of Needs  Tracey Wiley is seeking validation of her emotional responses and overall self-concept. Treatment Level  Weekly   Symptoms Tearfulness, irritability Problems Addressed  Goals Tracey Wiley will reduce symptoms of depression and improve overall mood 1. Tracey Wiley will increase comfort in social situations Objective  Target Date: 01/08/2023 Frequency: Weekly  Progress: 0 Modality: Individual therapy  Objective  Target Date: 01/07/2023 Frequency: Weekly  Progress: 0 Modality: individual therapy  Related Interventions  Tracey Wiley will have opportunities to process her experiences in session  Therapist will point out maladaptive thought and behavior patterns using CBT strategies. Therapist will incorporate behavior activation as appropriate. Therapist will incorporate gradual exposure therapy as appropriate. Therapist will provide referrals for additional resource as appropriate.  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

## 2022-04-11 ENCOUNTER — Other Ambulatory Visit: Payer: Self-pay | Admitting: Cardiovascular Disease

## 2022-04-11 DIAGNOSIS — I5189 Other ill-defined heart diseases: Secondary | ICD-10-CM

## 2022-04-15 ENCOUNTER — Other Ambulatory Visit: Payer: Medicare HMO

## 2022-04-21 ENCOUNTER — Ambulatory Visit (INDEPENDENT_AMBULATORY_CARE_PROVIDER_SITE_OTHER): Payer: Medicare HMO | Admitting: Clinical

## 2022-04-21 DIAGNOSIS — F4322 Adjustment disorder with anxiety: Secondary | ICD-10-CM | POA: Diagnosis not present

## 2022-04-21 NOTE — Progress Notes (Signed)
Time: 10:01 am- 10:54 am CPT Code: 40981X-91 Diagnosis: F43.22  Enrique was seen in person for individual therapy. She reported continuing to do well overall. She had completed her homework of noticing her anger to better understand her boundaries, and had been effective in setting boundaries. Session focused on continuing to understand the relationship between anger in interpersonal boundary setting. She is scheduled to be seen again in two weeks.  Treatment Plan Client Abilities/Strengths Addi shared that she has participated in therapy in the past and has been very honest in session.  Client Treatment Preferences:  Paislei prefers in person appointments. She prefers Tuesday and Thursday afternoon appointments. Client Statement of Needs  Elidia is seeking validation of her emotional responses and overall self-concept. Treatment Level  Weekly   Symptoms Tearfulness, irritability Problems Addressed  Goals Marigene will reduce symptoms of depression and improve overall mood 1. Cele will increase comfort in social situations Objective  Target Date: 01/08/2023 Frequency: Weekly  Progress: 0 Modality: Individual therapy  Objective  Target Date: 01/07/2023 Frequency: Weekly  Progress: 0 Modality: individual therapy  Related Interventions  Savana will have opportunities to process her experiences in session  Therapist will point out maladaptive thought and behavior patterns using CBT strategies. Therapist will incorporate behavior activation as appropriate. Therapist will incorporate gradual exposure therapy as appropriate. Therapist will provide referrals for additional resource as appropriate.  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

## 2022-04-22 ENCOUNTER — Other Ambulatory Visit (INDEPENDENT_AMBULATORY_CARE_PROVIDER_SITE_OTHER): Payer: Medicare HMO

## 2022-04-22 DIAGNOSIS — M353 Polymyalgia rheumatica: Secondary | ICD-10-CM

## 2022-04-23 LAB — SEDIMENTATION RATE: Sed Rate: 62 mm/hr — ABNORMAL HIGH (ref 0–30)

## 2022-04-24 MED ORDER — PREDNISONE 20 MG PO TABS: 40.0000 mg | ORAL_TABLET | Freq: Every day | ORAL | 0 refills | Status: AC

## 2022-04-24 NOTE — Addendum Note (Signed)
Addended by: Crecencio Mc on: 04/24/2022 09:56 AM   Modules accepted: Orders

## 2022-04-24 NOTE — Assessment & Plan Note (Signed)
ESR has become elevated on 30 mg daily dose.  Will resume 40 mg daily and repeat ESR when symptoms have improved.  If normal at that point, will reduce dose to 35 mg daily   Lab Results  Component Value Date   ESRSEDRATE 62 (H) 04/22/2022

## 2022-05-04 ENCOUNTER — Ambulatory Visit (INDEPENDENT_AMBULATORY_CARE_PROVIDER_SITE_OTHER): Payer: Medicare HMO | Admitting: Clinical

## 2022-05-04 DIAGNOSIS — F4322 Adjustment disorder with anxiety: Secondary | ICD-10-CM

## 2022-05-04 NOTE — Progress Notes (Signed)
Time: 1:01 pm- 1:59 pm CPT Code: [id number]-47 Diagnosis: F43.22  Tracey Wiley was seen remotely using secure video conferencing. She reported that she has been doing well overall, but had been disappointed by recent lab work that had resulted in a change to a previous diagnosis. Therapist offered an opportunity to process her experiences, engaging her in a discussion of previous medical experiences she has had, and offering validation and support while working to identify coping strategies. She is scheduled to be seen again in one week.  Treatment Plan Client Abilities/Strengths Tracey Wiley shared that she has participated in therapy in the past and has been very honest in session.  Client Treatment Preferences:  Tracey Wiley prefers in person appointments. She prefers Tuesday and Thursday afternoon appointments. Client Statement of Needs  Tracey Wiley is seeking validation of her emotional responses and overall self-concept. Treatment Level  Weekly   Symptoms Tearfulness, irritability Problems Addressed  Goals Tracey Wiley will reduce symptoms of depression and improve overall mood 1. Tracey Wiley will increase comfort in social situations Objective  Target Date: 01/08/2023 Frequency: Weekly  Progress: 0 Modality: Individual therapy  Objective  Target Date: 01/07/2023 Frequency: Weekly  Progress: 0 Modality: individual therapy  Related Interventions  Tracey Wiley will have opportunities to process her experiences in session  Therapist will point out maladaptive thought and behavior patterns using CBT strategies. Therapist will incorporate behavior activation as appropriate. Therapist will incorporate gradual exposure therapy as appropriate. Therapist will provide referrals for additional resource as appropriate.  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

## 2022-05-12 ENCOUNTER — Ambulatory Visit: Payer: Medicare HMO | Admitting: Clinical

## 2022-05-12 DIAGNOSIS — F4322 Adjustment disorder with anxiety: Secondary | ICD-10-CM

## 2022-05-12 NOTE — Progress Notes (Signed)
Time: 1:01 pm- 1:59 pm CPT Code: [id number]-97 Diagnosis: F43.22  Tracey Wiley was seen in person for therapy. She reported that she has been struggling with her partner's busy work schedule and increasingly aware of the one-sidedness of many of her friendships. Session focused on processing this, continuing to discuss healthy boundary setting and the idea of increased social exposure to help [name] meet new potential friends. She is scheduled to be seen again in one week.  Treatment Plan Client Abilities/Strengths [name] shared that she has participated in therapy in the past and has been very honest in session.  Client Treatment Preferences:  [name] prefers in person appointments. She prefers Tuesday and Thursday afternoon appointments. Client Statement of Needs  [name] is seeking validation of her emotional responses and overall self-concept. Treatment Level  Weekly   Symptoms Tearfulness, irritability Problems Addressed  Goals Peri will reduce symptoms of depression and improve overall mood 1. [name] will increase comfort in social situations Objective  Target Date: 01/08/2023 Frequency: Weekly  Progress: 0 Modality: Individual therapy  Objective  Target Date: 01/07/2023 Frequency: Weekly  Progress: 0 Modality: individual therapy  Related Interventions  [name] will have opportunities to process her experiences in session  Therapist will point out maladaptive thought and behavior patterns using CBT strategies. Therapist will incorporate behavior activation as appropriate. Therapist will incorporate gradual exposure therapy as appropriate. Therapist will provide referrals for additional resource as appropriate.  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

## 2022-05-18 ENCOUNTER — Ambulatory Visit: Payer: Medicare HMO | Admitting: Clinical

## 2022-05-18 DIAGNOSIS — F4322 Adjustment disorder with anxiety: Secondary | ICD-10-CM

## 2022-05-18 NOTE — Progress Notes (Signed)
Time: 1:01 pm- 1:59 pm CPT Code: [id number] Diagnosis: F43.22  Kailea was seen in person for therapy. Session continued to focus on relationship dynamics in order to better understand her feelings of grief related to the loss of several friendships she had believed to be closer than they have proven to be. Therapist engaged her in a discussion to understand factors that may have contributed to the situation. She is scheduled to be seen again in one month.  Treatment Plan Client Abilities/Strengths Shaterrica shared that she has participated in therapy in the past and has been very honest in session.  Client Treatment Preferences:  Noralyn prefers in person appointments. She prefers Tuesday and Thursday afternoon appointments. Client Statement of Needs  Angelia is seeking validation of her emotional responses and overall self-concept. Treatment Level  Weekly   Symptoms Tearfulness, irritability Problems Addressed  Goals Mikahla will reduce symptoms of depression and improve overall mood 1. Bryanna will increase comfort in social situations Objective  Target Date: 01/08/2023 Frequency: Weekly  Progress: 0 Modality: Individual therapy  Objective  Target Date: 01/07/2023 Frequency: Weekly  Progress: 0 Modality: individual therapy  Related Interventions  Ayleah will have opportunities to process her experiences in session  Therapist will point out maladaptive thought and behavior patterns using CBT strategies. Therapist will incorporate behavior activation as appropriate. Therapist will incorporate gradual exposure therapy as appropriate. Therapist will provide referrals for additional resource as appropriate.  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

## 2022-05-18 NOTE — Telephone Encounter (Signed)
Error

## 2022-05-19 NOTE — Telephone Encounter (Signed)
MyChart messgae sent to patient. 

## 2022-05-20 ENCOUNTER — Encounter: Payer: Self-pay | Admitting: Internal Medicine

## 2022-06-15 ENCOUNTER — Encounter: Payer: Self-pay | Admitting: Internal Medicine

## 2022-06-15 ENCOUNTER — Ambulatory Visit: Payer: Medicare HMO | Admitting: Internal Medicine

## 2022-06-15 VITALS — BP 136/74 | HR 69 | Temp 98.5°F | Ht 65.0 in | Wt 231.8 lb

## 2022-06-15 DIAGNOSIS — M353 Polymyalgia rheumatica: Secondary | ICD-10-CM

## 2022-06-15 DIAGNOSIS — M1990 Unspecified osteoarthritis, unspecified site: Secondary | ICD-10-CM | POA: Diagnosis not present

## 2022-06-15 DIAGNOSIS — F4329 Adjustment disorder with other symptoms: Secondary | ICD-10-CM

## 2022-06-15 NOTE — Assessment & Plan Note (Addendum)
Secondary to the loss of her beloved dog.  She is continuing therapy with 2 independent counsellors.

## 2022-06-15 NOTE — Progress Notes (Signed)
Subjective:  Patient ID: Tracey Wiley, female    DOB: 09-04-1955  Age: 67 y.o. MRN: [id number]  CC: The primary encounter diagnosis was Polymyalgia rheumatica ([place]). Diagnoses of Inflammatory arthritis and Grief reaction with prolonged bereavement were also pertinent to this visit.   HPI Tracey Wiley presents for  Chief Complaint  Patient presents with   Medical Management of Chronic Issues    3 month follow up    PMR:  last seen Oct 6,  was taking 40 mg daily,  currently taking prednisone 15 mg daily , dose was reduced from 40 mg by 5 mg weekly by  [facility] in November .  She is on day 15 of 15 mg.  Has appointment tomorrow at [facility] .  She has been advised to reduce the dose to 12.5 mg tomorrow using a pill cutter on the 5 mg tablets.   ESR was 56 ,  CRP was 18 on Nov 21 at [facility] while taking 40 mg .  Hip pain problematic : She was advised to consider PT for the mild OA of hips .   She remains legally married to Tracey Wiley  who continues to decline,  although committed to [place] for the past 13 years. Tracey Wiley's sons are not involved or supportive, which is why she has maintained the . .  Grief/adjustment disorder:  9 months since her dog Tracey Wiley died.  Still very much grieved and attached to her memory.  improving with therapy ,   She is seeing Dr [name] at [facility] in  [place]  since August seeing her weekly along with tina thompson          Outpatient Medications Prior to Visit  Medication Sig Dispense Refill   acetaminophen (TYLENOL) 500 MG tablet Take 500 mg by mouth daily as needed for pain.     albuterol (VENTOLIN HFA) 108 (90 Base) MCG/ACT inhaler Inhale 2 puffs into the lungs every 6 (six) hours as needed for wheezing or shortness of breath. 1 each 1   aspirin EC 81 MG tablet Take 81 mg by mouth daily.     benzonatate (TESSALON) 200 MG capsule Take 1 capsule (200 mg total) by mouth 3 (three) times daily as needed for cough. 60 capsule 1   calcium-vitamin D 250-100  MG-UNIT tablet Take 1 tablet by mouth 2 (two) times daily.     chlorhexidine (PERIDEX) 0.12 % solution GARGLE AND SPIT 15 MLS IN THE MOUTH OR THROAT PRN. 473 mL 0   colchicine 0.6 MG tablet TAKE 1 TABLET (0.6 MG TOTAL) BY MOUTH 2 (TWO) TIMES DAILY AS NEEDED. 180 tablet 1   [employer]S HYDROCORTISONE ANTI-ITCH 0.5 % APPLY TO THE AFFECTED AREA TWICE A DAY 57 g 11   Echinacea 400 MG CAPS Take 1 capsule by mouth daily.     esomeprazole (NEXIUM) 40 MG capsule Take 1 capsule (40 mg total) by mouth daily at 12 noon. 90 capsule 3   ezetimibe (ZETIA) 10 MG tablet TAKE 1 TABLET BY MOUTH EVERY DAY 90 tablet 2   famotidine (PEPCID) 20 MG tablet Take 20 mg by mouth at bedtime.     folic acid (FOLVITE) 1 MG tablet Take 1 mg by mouth daily.     furosemide (LASIX) 20 MG tablet TAKE 1 TABLET BY MOUTH 4 DAYS PER WEEK AS DIRECTED 48 tablet 1   hyoscyamine (LEVSIN SL) 0.125 MG SL tablet Place 1 tablet (0.125 mg total) under the tongue every 4 (four) hours as needed  for cramping. 60 tablet 3   magnesium gluconate (MAGONATE) 500 MG tablet Take 500 mg by mouth at bedtime.     MELATONIN PO Take 8 mg by mouth at bedtime.     ondansetron (ZOFRAN ODT) 4 MG disintegrating tablet Take 1 tablet (4 mg total) by mouth every 8 (eight) hours as needed for nausea or vomiting. 20 tablet 0   potassium chloride (KLOR-CON) 10 MEQ tablet TAKE 1 TABLET (10 MEQ TOTAL) BY MOUTH DAILY. TAKE 1 TABLET DAILY, 4 TIMES A WEEK. TAKE WITH LASIX (MAY TAKE EXTRA TAB WITH EXTRA LASIX) 90 tablet 3   sotalol (BETAPACE) 80 MG tablet TAKE 1 TABLET BY MOUTH TWICE A DAY 180 tablet 2   triamterene-hydrochlorothiazide (DYAZIDE) 37.5-25 MG capsule TAKE 1 CAPSULE BY MOUTH EVERY DAY 90 capsule 2   valACYclovir (VALTREX) 1000 MG tablet TAKE 1 TABLET BY MOUTH DAILY FOR SUPPRESSION , OR 5 TIMES DAILY FOR FLARE 35 tablet 2   vitamin C (ASCORBIC ACID) 500 MG tablet Take 500 mg by mouth daily.     zinc gluconate 50 MG tablet Take 50 mg by mouth daily.     predniSONE  (DELTASONE) 5 MG tablet Take 15 mg by mouth daily with breakfast.     No facility-administered medications prior to visit.    Review of Systems;  Patient denies headache, fevers, malaise, unintentional weight loss, skin rash, eye pain, sinus congestion and sinus pain, sore throat, dysphagia,  hemoptysis , cough, dyspnea, wheezing, chest pain, palpitations, orthopnea, edema, abdominal pain, nausea, melena, diarrhea, constipation, flank pain, dysuria, hematuria, urinary  Frequency, nocturia, numbness, tingling, seizures,  Focal weakness, Loss of consciousness,  Tremor, insomnia, depression, anxiety, and suicidal ideation.      Objective:  BP 136/74   Pulse 69   Temp 98.5 F (36.9 C) (Oral)   Ht '5\' 5"'$  (1.651 m)   Wt 231 lb 12.8 oz (105.1 kg)   SpO2 96%   BMI 38.57 kg/m   BP Readings from Last 3 Encounters:  06/15/22 136/74  03/12/22 138/80  02/16/22 133/79    Wt Readings from Last 3 Encounters:  06/15/22 231 lb 12.8 oz (105.1 kg)  03/12/22 227 lb 3.2 oz (103.1 kg)  03/11/22 225 lb (102.1 kg)    Physical Exam Vitals reviewed.  Constitutional:      General: She is not in acute distress.    Appearance: Normal appearance. She is normal weight. She is not ill-appearing, toxic-appearing or diaphoretic.  HENT:     Head: Normocephalic.  Eyes:     General: No scleral icterus.       Right eye: No discharge.        Left eye: No discharge.     Conjunctiva/sclera: Conjunctivae normal.  Cardiovascular:     Rate and Rhythm: Normal rate and regular rhythm.     Heart sounds: Normal heart sounds.  Pulmonary:     Effort: Pulmonary effort is normal. No respiratory distress.     Breath sounds: Normal breath sounds.  Musculoskeletal:        General: Normal range of motion.  Skin:    General: Skin is warm and dry.  Neurological:     General: No focal deficit present.     Mental Status: She is alert and oriented to person, place, and time. Mental status is at baseline.  Psychiatric:         Mood and Affect: Mood normal.        Behavior: Behavior normal.  Thought Content: Thought content normal.        Judgment: Judgment normal.     Lab Results  Component Value Date   HGBA1C 5.8 (H) 12/22/2021   HGBA1C 6.0 (H) 08/18/2020   HGBA1C 6.2 (H) 03/20/2020    Lab Results  Component Value Date   CREATININE 0.96 03/12/2022   CREATININE 0.71 12/04/2021   CREATININE 0.78 06/03/2021    Lab Results  Component Value Date   WBC 9.6 12/04/2021   HGB 12.8 12/04/2021   HCT 39.3 12/04/2021   PLT 295 12/04/2021   GLUCOSE 112 (H) 03/12/2022   CHOL 226 (H) 12/22/2021   TRIG 159 (H) 12/22/2021   HDL 43 12/22/2021   LDLDIRECT 136.0 01/23/2019   LDLCALC 151 (H) 12/22/2021   ALT 15 12/04/2021   AST 20 12/04/2021   NA 137 03/12/2022   K 3.5 03/12/2022   CL 97 03/12/2022   CREATININE 0.96 03/12/2022   BUN 20 03/12/2022   CO2 32 03/12/2022   TSH 2.745 03/20/2020   HGBA1C 5.8 (H) 12/22/2021    DG Chest 2 View  Result Date: 02/17/2022 CLINICAL DATA:  Acute cough EXAM: CHEST - 2 VIEW COMPARISON:  Chest radiograph Oct 08, 2021 FINDINGS: Stable cardiac and mediastinal contours. No large area pulmonary consolidation. No pleural effusion or pneumothorax. Thoracic spine degenerative changes. PFO occlusion device. IMPRESSION: No active cardiopulmonary disease. Electronically Signed   By: Lovey Newcomer M.D.   On: 02/17/2022 13:48    Assessment & Plan:  .Polymyalgia rheumatica (HCC) -     Sedimentation rate -     C-reactive protein  Inflammatory arthritis Assessment & Plan: Believed to be PMR which did not respond to lower doses of prednisone . Currently has had dose gradually reduced from 40 mg to 16 mg currently    Grief reaction with prolonged bereavement Assessment & Plan: Secondary to the loss of her beloved dog.  She is continuing therapy with 2 independent counsellors.       I provided 30 minutes of face-to-face time during this encounter reviewing patient's  last visit with me, patient's  most recent visit with [facility],   recent surgical and non surgical procedures, previous  labs and imaging studies, counseling on currently addressed issues,  and post visit ordering to diagnostics and therapeutics .   Follow-up: Return in about 3 months (around 09/14/2022).   Crecencio Mc, MD

## 2022-06-15 NOTE — Assessment & Plan Note (Signed)
Believed to be PMR which did not respond to lower doses of prednisone . Currently has had dose gradually reduced from 40 mg to 16 mg currently

## 2022-06-15 NOTE — Patient Instructions (Signed)
The prednisone DOES come in a 2.5 mg tablet so let me know if you need me to send it in, or if you would like the PT referral to come from me

## 2022-06-16 LAB — SEDIMENTATION RATE: Sed Rate: 40 mm/hr — ABNORMAL HIGH (ref 0–30)

## 2022-06-16 LAB — C-REACTIVE PROTEIN: CRP: 2.9 mg/dL (ref 0.5–20.0)

## 2022-06-18 ENCOUNTER — Ambulatory Visit (INDEPENDENT_AMBULATORY_CARE_PROVIDER_SITE_OTHER): Payer: Medicare HMO | Admitting: Clinical

## 2022-06-18 DIAGNOSIS — F4322 Adjustment disorder with anxiety: Secondary | ICD-10-CM | POA: Diagnosis not present

## 2022-06-18 NOTE — Progress Notes (Signed)
Time: 1:01 pm- 1:59 pm CPT Code: 44461J-01 Diagnosis: F43.22  Tracey Wiley was seen remotely using secure video conferencing. She was in her home and the therapist was in her office at the time of the appointment. Session focused on an argument she had had with her partner. Therapist offered validation, support, and an opportunity to process her experience. She is scheduled to be seen again in one week.  Treatment Plan Client Abilities/Strengths Tracey Wiley shared that she has participated in therapy in the past and has been very honest in session.  Client Treatment Preferences:  Tracey Wiley prefers in person appointments. She prefers Tuesday and Thursday afternoon appointments. Client Statement of Needs  Tracey Wiley is seeking validation of her emotional responses and overall self-concept. Treatment Level  Weekly   Symptoms Tearfulness, irritability Problems Addressed  Goals Tracey Wiley will reduce symptoms of depression and improve overall mood 1. Tracey Wiley will increase comfort in social situations Objective  Target Date: 01/08/2023 Frequency: Weekly  Progress: 0 Modality: Individual therapy  Objective  Target Date: 01/07/2023 Frequency: Weekly  Progress: 0 Modality: individual therapy  Related Interventions  Tracey Wiley will have opportunities to process her experiences in session  Therapist will point out maladaptive thought and behavior patterns using CBT strategies. Therapist will incorporate behavior activation as appropriate. Therapist will incorporate gradual exposure therapy as appropriate. Therapist will provide referrals for additional resource as appropriate.  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

## 2022-06-25 ENCOUNTER — Ambulatory Visit (INDEPENDENT_AMBULATORY_CARE_PROVIDER_SITE_OTHER): Payer: Medicare HMO | Admitting: Clinical

## 2022-06-25 DIAGNOSIS — F4322 Adjustment disorder with anxiety: Secondary | ICD-10-CM | POA: Diagnosis not present

## 2022-06-25 NOTE — Progress Notes (Signed)
Time: 10:01 am- 10:59 am CPT Code: 18299B-71 Diagnosis: F43.22  Tracey Wiley was seen remotely using secure video conferencing. She was in her home and the therapist was in her office at the time of the appointment. Session focused on processing an interaction with a medical provider that had triggered significant anxiety that she may have cancer (she has since learned that this was a miscommunication). Therapist provided validation and support, encouraging her to find activities that allow her to calm her nervous system, and looking for ways to ground and calm herself before taking catastrophizing thoughts seriously. She is scheduled to be seen again in one week.  Treatment Plan Client Abilities/Strengths Tracey Wiley shared that she has participated in therapy in the past and has been very honest in session.  Client Treatment Preferences:  Tracey Wiley prefers in person appointments. She prefers Tuesday and Thursday afternoon appointments. Client Statement of Needs  Tracey Wiley is seeking validation of her emotional responses and overall self-concept. Treatment Level  Weekly   Symptoms Tearfulness, irritability Problems Addressed  Goals Tracey Wiley will reduce symptoms of depression and improve overall mood 1. Tracey Wiley will increase comfort in social situations Objective  Target Date: 01/08/2023 Frequency: Weekly  Progress: 0 Modality: Individual therapy  Objective  Target Date: 01/07/2023 Frequency: Weekly  Progress: 0 Modality: individual therapy  Related Interventions  Tracey Wiley will have opportunities to process her experiences in session  Therapist will point out maladaptive thought and behavior patterns using CBT strategies. Therapist will incorporate behavior activation as appropriate. Therapist will incorporate gradual exposure therapy as appropriate. Therapist will provide referrals for additional resource as appropriate.  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

## 2022-07-01 ENCOUNTER — Ambulatory Visit (INDEPENDENT_AMBULATORY_CARE_PROVIDER_SITE_OTHER): Payer: Medicare HMO | Admitting: Clinical

## 2022-07-01 ENCOUNTER — Encounter: Payer: Self-pay | Admitting: Internal Medicine

## 2022-07-01 DIAGNOSIS — F4322 Adjustment disorder with anxiety: Secondary | ICD-10-CM | POA: Diagnosis not present

## 2022-07-01 NOTE — Progress Notes (Signed)
Time: 11:01 am- 11:59 am CPT Code: 25638L-37 Diagnosis: F43.22  Tracey Wiley was seen remotely using secure video conferencing. She was in her home and the therapist was in her office at the time of the appointment. Session focused on events that had transpired since her last session. She reported that she had gotten more information about her medical situation, and felt more at ease now that she has a plan for next steps. She also reported that a friend with whom she had reduced contact due to the one-sided nature of the friendship had reached out and checked in on her with more reciprocity. She is scheduled to be seen again in one week.  Treatment Plan Client Abilities/Strengths [name] shared that she has participated in therapy in the past and has been very honest in session.  Client Treatment Preferences:  [name] prefers in person appointments. She prefers Tuesday and Thursday afternoon appointments. Client Statement of Needs  [name] is seeking validation of her emotional responses and overall self-concept. Treatment Level  Weekly   Symptoms Tearfulness, irritability Problems Addressed  Goals [name] 1. [name] will increase comfort in social situations Objective  Target Date: 01/08/2023 Frequency: Weekly  Progress: 0 Modality: Individual therapy  Objective  Target Date: 01/07/2023 Frequency: Weekly  Progress: 0 Modality: individual therapy  Related Interventions  Tiernan will have opportunities to process her experiences in session  Therapist will point out maladaptive thought and behavior patterns using CBT strategies. Therapist will incorporate behavior activation as appropriate. Therapist will incorporate gradual exposure therapy as appropriate. Therapist will provide referrals for additional resource as appropriate.  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

## 2022-07-02 ENCOUNTER — Encounter: Payer: Self-pay | Admitting: Internal Medicine

## 2022-07-05 ENCOUNTER — Other Ambulatory Visit: Payer: Self-pay | Admitting: Physician Assistant

## 2022-07-07 NOTE — Telephone Encounter (Signed)
Spoke with pt and scheduled her for tomorrow at 3pm.

## 2022-07-07 NOTE — Progress Notes (Unsigned)
Tracey Morrow, Tracey Wiley Phone: [phone]  Tracey Wiley is a 67 y.o. female who presents today for cough and congestion x 4 days. She has had recent COVID exposure. She is getting good symptom relief with OTC treatments.   Respiratory illness:  Cough- Yes  Congestion-    Sinus- Yes, nasal   Chest- Yes  Post nasal drip- Yes  Sore throat- No  Shortness of breath- Occasionally on exertion  Fever- No  Fatigue/Myalgia- Yes Headache- Yes Nausea/Vomiting- No Taste disturbance- Yes  Smell disturbance- Yes  Covid exposure- Yes  Covid vaccination- x 4  Flu vaccination- UTD  Medications- Dayquil/Nyquil, Mucinex   Social History   Tobacco Use  Smoking Status Never  Smokeless Tobacco Never    Current Outpatient Medications on File Prior to Visit  Medication Sig Dispense Refill   acetaminophen (TYLENOL) 500 MG tablet Take 500 mg by mouth daily as needed for pain.     albuterol (VENTOLIN HFA) 108 (90 Base) MCG/ACT inhaler Inhale 2 puffs into the lungs every 6 (six) hours as needed for wheezing or shortness of breath. 1 each 1   aspirin EC 81 MG tablet Take 81 mg by mouth daily.     calcium-vitamin D 250-100 MG-UNIT tablet Take 1 tablet by mouth 2 (two) times daily.     chlorhexidine (PERIDEX) 0.12 % solution GARGLE AND SPIT 15 MLS IN THE MOUTH OR THROAT PRN. 473 mL 0   colchicine 0.6 MG tablet TAKE 1 TABLET (0.6 MG TOTAL) BY MOUTH 2 (TWO) TIMES DAILY AS NEEDED. 180 tablet 1   [employer]S HYDROCORTISONE ANTI-ITCH 0.5 % APPLY TO THE AFFECTED AREA TWICE A DAY 57 g 11   Echinacea 400 MG CAPS Take 1 capsule by mouth daily.     esomeprazole (NEXIUM) 40 MG capsule TAKE 1 CAPSULE (40 MG TOTAL) BY MOUTH DAILY AT 12 NOON. 30 capsule 0   ezetimibe (ZETIA) 10 MG tablet TAKE 1 TABLET BY MOUTH EVERY DAY 90 tablet 2   famotidine (PEPCID) 20 MG tablet Take 20 mg by mouth at bedtime.     folic acid (FOLVITE) 1 MG tablet Take 1 mg by mouth daily.     furosemide (LASIX) 20 MG tablet TAKE 1 TABLET BY MOUTH 4 DAYS  PER WEEK AS DIRECTED 48 tablet 1   hyoscyamine (LEVSIN SL) 0.125 MG SL tablet Place 1 tablet (0.125 mg total) under the tongue every 4 (four) hours as needed for cramping. 60 tablet 3   magnesium gluconate (MAGONATE) 500 MG tablet Take 500 mg by mouth at bedtime.     MELATONIN PO Take 8 mg by mouth at bedtime.     ondansetron (ZOFRAN ODT) 4 MG disintegrating tablet Take 1 tablet (4 mg total) by mouth every 8 (eight) hours as needed for nausea or vomiting. 20 tablet 0   potassium chloride (KLOR-CON) 10 MEQ tablet TAKE 1 TABLET (10 MEQ TOTAL) BY MOUTH DAILY. TAKE 1 TABLET DAILY, 4 TIMES A WEEK. TAKE WITH LASIX (MAY TAKE EXTRA TAB WITH EXTRA LASIX) 90 tablet 3   predniSONE (DELTASONE) 5 MG tablet Take 15 mg by mouth daily with breakfast.     sotalol (BETAPACE) 80 MG tablet TAKE 1 TABLET BY MOUTH TWICE A DAY 180 tablet 2   triamterene-hydrochlorothiazide (DYAZIDE) 37.5-25 MG capsule TAKE 1 CAPSULE BY MOUTH EVERY DAY 90 capsule 2   valACYclovir (VALTREX) 1000 MG tablet TAKE 1 TABLET BY MOUTH DAILY FOR SUPPRESSION , OR 5 TIMES DAILY FOR FLARE 35 tablet 2   vitamin C (  ASCORBIC ACID) 500 MG tablet Take 500 mg by mouth daily.     zinc gluconate 50 MG tablet Take 50 mg by mouth daily.     benzonatate (TESSALON) 200 MG capsule Take 1 capsule (200 mg total) by mouth 3 (three) times daily as needed for cough. (Patient not taking: Reported on 07/08/2022) 60 capsule 1   No current facility-administered medications on file prior to visit.     ROS see history of present illness  Objective  Physical Exam Vitals:   07/08/22 1527  BP: 128/80  Pulse: 62  Temp: 98.5 F (36.9 C)  SpO2: 97%    BP Readings from Last 3 Encounters:  07/08/22 128/80  06/15/22 136/74  03/12/22 138/80   Wt Readings from Last 3 Encounters:  07/08/22 230 lb 3.2 oz (104.4 kg)  06/15/22 231 lb 12.8 oz (105.1 kg)  03/12/22 227 lb 3.2 oz (103.1 kg)    Physical Exam Constitutional:      General: She is not in acute distress.     Appearance: Normal appearance.  HENT:     Head: Normocephalic.     Right Ear: Tympanic membrane normal.     Left Ear: Tympanic membrane normal.     Nose: Nose normal.     Mouth/Throat:     Mouth: Mucous membranes are moist.     Pharynx: Oropharynx is clear.  Eyes:     Conjunctiva/sclera: Conjunctivae normal.     Pupils: Pupils are equal, round, and reactive to light.  Cardiovascular:     Rate and Rhythm: Normal rate and regular rhythm.     Heart sounds: Normal heart sounds.  Pulmonary:     Effort: Pulmonary effort is normal.     Breath sounds: Normal breath sounds.  Abdominal:     General: Abdomen is flat. Bowel sounds are normal.     Palpations: Abdomen is soft. There is no mass.     Tenderness: There is no abdominal tenderness.  Lymphadenopathy:     Cervical: No cervical adenopathy.  Skin:    General: Skin is warm and dry.  Neurological:     General: No focal deficit present.     Mental Status: She is alert.  Psychiatric:        Mood and Affect: Mood normal.        Behavior: Behavior normal.    Assessment/Plan: Please see individual problem list.  COVID Assessment & Plan: COVID test positive in office. Long discussion about anti-virals and their risks vs.benefits. Patient agreeable to OTC symptomatic treatment. She will continue Dayquil/Nyquil and Mucinex as needed. She also has an albuterol inhaler to use as needed. Encouraged adequate fluid intake. Counseled on quarantine and return precautions.    Flu-like symptoms -     Respiratory virus panel  Encounter for screening for COVID-19 -     POC COVID-19 BinaxNow   Return if symptoms worsen or fail to improve.   Tracey Morrow, Tracey Wiley [place]

## 2022-07-08 ENCOUNTER — Ambulatory Visit: Payer: Medicare HMO | Admitting: Nurse Practitioner

## 2022-07-08 ENCOUNTER — Ambulatory Visit (INDEPENDENT_AMBULATORY_CARE_PROVIDER_SITE_OTHER): Payer: Medicare HMO | Admitting: Clinical

## 2022-07-08 ENCOUNTER — Encounter: Payer: Self-pay | Admitting: Nurse Practitioner

## 2022-07-08 VITALS — BP 128/80 | HR 62 | Temp 98.5°F | Ht 65.0 in | Wt 230.2 lb

## 2022-07-08 DIAGNOSIS — Z1152 Encounter for screening for COVID-19: Secondary | ICD-10-CM | POA: Diagnosis not present

## 2022-07-08 DIAGNOSIS — U071 COVID-19: Secondary | ICD-10-CM

## 2022-07-08 DIAGNOSIS — F4322 Adjustment disorder with anxiety: Secondary | ICD-10-CM | POA: Diagnosis not present

## 2022-07-08 DIAGNOSIS — R6889 Other general symptoms and signs: Secondary | ICD-10-CM

## 2022-07-08 DIAGNOSIS — Z8616 Personal history of COVID-19: Secondary | ICD-10-CM

## 2022-07-08 HISTORY — DX: Personal history of COVID-19: Z86.16

## 2022-07-08 HISTORY — DX: COVID-19: U07.1

## 2022-07-08 LAB — POC COVID19 BINAXNOW: SARS Coronavirus 2 Ag: POSITIVE — AB

## 2022-07-08 NOTE — Assessment & Plan Note (Addendum)
COVID test positive in office. Long discussion about anti-virals and their risks vs.benefits. Patient agreeable to OTC symptomatic treatment. She will continue Dayquil/Nyquil and Mucinex as needed. She also has an albuterol inhaler to use as needed. Encouraged adequate fluid intake. Counseled on quarantine and return precautions.

## 2022-07-08 NOTE — Patient Instructions (Addendum)
It was nice to meet you!  I have attached some information about COVID for you.   Please make sure you are drinking plenty of fluids.   You can continue OTC symptomatic treatment with Dayquil/Nyquil and Mucinex. Use your albuterol inhaler as needed.   Return to care if your symptoms are not improving. If you develop shortness or breath or difficulty breathing please go to the hospital.

## 2022-07-08 NOTE — Progress Notes (Signed)
Time: 12:01 pm- 12:56 pm CPT Code: [id number] Diagnosis: F43.22  Tracey Wiley was seen remotely using secure video conferencing. She was in her home and the therapist was in her office at the time of the appointment. She shared that her partner had tested positive for COVID, and she had been sick for the past week. Session focused on processing this, with therapist offering validation and support. Tracey Wiley also shared about several of her relationships, as well as the idea of starting a Facebook group as a way of meeting others. She is scheduled to be seen again in one week  Treatment Plan Client Abilities/Strengths Tracey Wiley shared that she has participated in therapy in the past and has been very honest in session.  Client Treatment Preferences:  Tracey Wiley prefers in person appointments. She prefers Tuesday and Thursday afternoon appointments. Client Statement of Needs  Tracey Wiley is seeking validation of her emotional responses and overall self-concept. Treatment Level  Weekly   Symptoms Tearfulness, irritability Problems Addressed  Goals Tracey Wiley will reduce symptoms of depression and improve overall mood 1. Tracey Wiley will increase comfort in social situations Objective  Target Date: 01/08/2023 Frequency: Weekly  Progress: 0 Modality: Individual therapy  Objective  Target Date: 01/07/2023 Frequency: Weekly  Progress: 0 Modality: individual therapy  Related Interventions  Tracey Wiley will have opportunities to process her experiences in session  Therapist will point out maladaptive thought and behavior patterns using CBT strategies. Therapist will incorporate behavior activation as appropriate. Therapist will incorporate gradual exposure therapy as appropriate. Therapist will provide referrals for additional resource as appropriate.  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

## 2022-07-09 ENCOUNTER — Other Ambulatory Visit: Payer: Self-pay

## 2022-07-09 MED ORDER — BENZONATATE 200 MG PO CAPS: 200.0000 mg | ORAL_CAPSULE | Freq: Three times a day (TID) | ORAL | 1 refills | Status: DC | PRN

## 2022-07-11 LAB — RESPIRATORY VIRUS PANEL

## 2022-07-12 ENCOUNTER — Telehealth: Payer: Self-pay | Admitting: Physician Assistant

## 2022-07-12 NOTE — Telephone Encounter (Signed)
Incoming call from patient states she is returning a call from DD. Says if not a call back today, then any day after 10:30 works

## 2022-07-13 ENCOUNTER — Other Ambulatory Visit: Payer: Self-pay | Admitting: Nurse Practitioner

## 2022-07-13 ENCOUNTER — Encounter: Payer: Self-pay | Admitting: Nurse Practitioner

## 2022-07-13 DIAGNOSIS — J329 Chronic sinusitis, unspecified: Secondary | ICD-10-CM

## 2022-07-13 MED ORDER — AMOXICILLIN-POT CLAVULANATE 875-125 MG PO TABS: 1.0000 | ORAL_TABLET | Freq: Two times a day (BID) | ORAL | 0 refills | Status: DC

## 2022-07-15 ENCOUNTER — Ambulatory Visit (INDEPENDENT_AMBULATORY_CARE_PROVIDER_SITE_OTHER): Payer: Medicare HMO | Admitting: Clinical

## 2022-07-15 DIAGNOSIS — F4322 Adjustment disorder with anxiety: Secondary | ICD-10-CM

## 2022-07-15 NOTE — Progress Notes (Signed)
Time: 1:01 pm- 1:56 pm CPT Code: [id number] Diagnosis: F43.22  Tracey Wiley was seen remotely using secure video conferencing. She was in her home and the therapist was in her office at the time of the appointment. She reflected upon her experience of having COVID over the past week. She was able to see positive aspects, such as renewed freedom from her fear of catching COVID. She is scheduled to be seen again in two weeks.  Treatment Plan Client Abilities/Strengths Tracey Wiley shared that she has participated in therapy in the past and has been very honest in session.  Client Treatment Preferences:  Tracey Wiley prefers in person appointments. She prefers Tuesday and Thursday afternoon appointments. Client Statement of Needs  Tracey Wiley is seeking validation of her emotional responses and overall self-concept. Treatment Level  Weekly   Symptoms Tearfulness, irritability Problems Addressed  Goals Tracey Wiley will reduce symptoms of depression and improve overall mood 1. Tracey Wiley will increase comfort in social situations Objective  Target Date: 01/08/2023 Frequency: Weekly  Progress: 0 Modality: Individual therapy  Objective  Target Date: 01/07/2023 Frequency: Weekly  Progress: 0 Modality: individual therapy  Related Interventions  Tracey Wiley will have opportunities to process her experiences in session  Therapist will point out maladaptive thought and behavior patterns using CBT strategies. Therapist will incorporate behavior activation as appropriate. Therapist will incorporate gradual exposure therapy as appropriate. Therapist will provide referrals for additional resource as appropriate.  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

## 2022-07-29 ENCOUNTER — Other Ambulatory Visit: Payer: Self-pay

## 2022-07-29 ENCOUNTER — Encounter
Admission: RE | Admit: 2022-07-29 | Discharge: 2022-07-29 | Disposition: A | Discharge status: Home / Self Care | Payer: Medicare HMO | Source: Ambulatory Visit | Attending: Obstetrics and Gynecology | Admitting: Obstetrics and Gynecology

## 2022-07-29 HISTORY — DX: Other specified postprocedural states: R11.2

## 2022-07-29 HISTORY — DX: Dyspnea, unspecified: R06.00

## 2022-07-29 HISTORY — DX: Prediabetes: R73.03

## 2022-07-29 HISTORY — DX: Acute embolism and thrombosis of unspecified deep veins of unspecified lower extremity: I82.409

## 2022-07-29 HISTORY — DX: Other complications of anesthesia, initial encounter: T88.59XA

## 2022-07-29 HISTORY — DX: Other specified postprocedural states: Z98.890

## 2022-07-29 HISTORY — DX: Angina pectoris, unspecified: I20.9
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
## 2022-07-29 NOTE — Patient Instructions (Addendum)
Your procedure is scheduled on: Friday 08/06/22 Report to the Registration Desk on the 1st floor of the [facility]. To find out your arrival time, please call [phone] between 1PM - 3PM on: Thursday 08/05/22 If your arrival time is 6:00 am, do not arrive before that time as the [facility] entrance doors do not open until 6:00 am.  REMEMBER: Instructions that are not followed completely may result in serious medical risk, up to and including death; or upon the discretion of your surgeon and anesthesiologist your surgery may need to be rescheduled.  Do not eat food after midnight the night before surgery.  No gum chewing or hard candies.  You may however, drink CLEAR liquids up to 2 hours before you are scheduled to arrive for your surgery. Do not drink anything within 2 hours of your scheduled arrival time.  Clear liquids include: - water  - apple juice without pulp - gatorade (not RED colors) - black coffee or tea (Do NOT add milk or creamers to the coffee or tea) Do NOT drink anything that is not on this list.  Type 1 and Type 2 diabetics should only drink water.  One week prior to surgery: Stop Anti-inflammatories (NSAIDS) such as Advil, Aleve, Ibuprofen, Motrin, Naproxen, Naprosyn,   Excedrin, Goody's Powder, BC Powder. You may however, continue to take Tylenol if needed for pain up until the day of surgery.  Stop ANY OVER THE COUNTER supplements until after surgery.  Continue taking all prescribed medications with the exception of the following: N/A  Follow recommendations from Cardiologist or PCP regarding stopping blood thinners.  TAKE ONLY THESE MEDICATIONS THE MORNING OF SURGERY WITH A SIP OF WATER:  diazepam (VALIUM) 5 MG tablet  esomeprazole (NEXIUM) 40 MG capsule  predniSONE (DELTASONE) 5 MG tablet  sotalol (BETAPACE) 80 MG tablet  valACYclovir (VALTREX) 1000 MG tablet   Antacid (take one the night before and one on the morning of surgery - helps to prevent  nausea after surgery.)  Use inhalers on the day of surgery and bring to the hospital.  No Alcohol for 24 hours before or after surgery.  No Smoking including e-cigarettes for 24 hours before surgery.  No chewable tobacco products for at least 6 hours before surgery.  No nicotine patches on the day of surgery.  Do not use any "recreational" drugs for at least a week (preferably 2 weeks) before your surgery.  Please be advised that the combination of cocaine and anesthesia may have negative outcomes, up to and including death. If you test positive for cocaine, your surgery will be cancelled.  On the morning of surgery brush your teeth with toothpaste and water, you may rinse your mouth with mouthwash if you wish. Do not swallow any toothpaste or mouthwash.  Use CHG Soap or wipes as directed on instruction sheet. YOU MAY SHOWER WITH YOUR REGULAR SOAP  Do not wear jewelry, make-up, hairpins, clips or nail polish.  Do not wear lotions, powders, or perfumes. YOU MAY USE DEODORANT  Do not shave body hair from the neck down 48 hours before surgery.  Contact lenses, hearing aids and dentures may not be worn into surgery.  Do not bring valuables to the hospital. [facility] is not responsible for any missing/lost belongings or valuables.   Notify your doctor if there is any change in your medical condition (cold, fever, infection).  Wear comfortable clothing (specific to your surgery type) to the hospital.  After surgery, you can help prevent lung  complications by doing breathing exercises.  Take deep breaths and cough every 1-2 hours. Your doctor may order a device called an Incentive Spirometer to help you take deep breaths. When coughing or sneezing, hold a pillow firmly against your incision with both hands. This is called "splinting." Doing this helps protect your incision. It also decreases belly discomfort.  If you are being admitted to the hospital overnight, leave your suitcase in  the car. After surgery it may be brought to your room.  In case of increased patient census, it may be necessary for you, the patient, to continue your postoperative care in the Same Day Surgery department.  If you are being discharged the day of surgery, you will not be allowed to drive home. You will need a responsible individual to drive you home and stay with you for 24 hours after surgery.   If you are taking public transportation, you will need to have a responsible individual with you.  Please call the [facility] at [phone] if you have any questions about these instructions.  Surgery Visitation Policy:  Patients undergoing a surgery or procedure may have two family members or support persons with them as long as the person is not COVID-19 positive or experiencing its symptoms.   Inpatient Visitation:    Visiting hours are 7 a.m. to 8 p.m. Up to four visitors are allowed at one time in a patient room. The visitors may rotate out with other people during the day. One designated support person (adult) may remain overnight.  Due to an increase in RSV and influenza rates and associated hospitalizations, children ages 55 and under will not be able to visit patients in [facility]. Masks continue to be strongly recommended.

## 2022-07-29 NOTE — Pre-Procedure Instructions (Signed)
See OR anesthesia notes from August 2018

## 2022-07-30 ENCOUNTER — Telehealth: Payer: Self-pay | Admitting: *Deleted

## 2022-07-30 ENCOUNTER — Ambulatory Visit: Payer: Medicare HMO | Attending: Nurse Practitioner | Admitting: Nurse Practitioner

## 2022-07-30 DIAGNOSIS — Z01810 Encounter for preprocedural cardiovascular examination: Secondary | ICD-10-CM

## 2022-07-30 NOTE — Telephone Encounter (Signed)
Patient returning call.

## 2022-07-30 NOTE — Telephone Encounter (Signed)
   Name: Tracey Wiley  DOB: 1956/03/19  MRN: [id number]  Primary Cardiologist: Ida Rogue, MD   Preoperative team, please contact this patient and set up a phone call appointment for further preoperative risk assessment. Please obtain consent and complete medication review. Thank you for your help.  I confirm that guidance regarding antiplatelet and oral anticoagulation therapy has been completed and, if necessary, noted below.  Per office protocol, if patient is without any new symptoms or concerns at the time of their virtual visit, she may hold Aspirin for 5-7 days prior to procedure. Please resume Aspirin as soon as possible postprocedure, at the discretion of the surgeon.    Tracey Sciara, NP 07/30/2022, 9:14 AM [place]

## 2022-07-30 NOTE — Telephone Encounter (Signed)
Pt has been scheduled for tele visit for 07/30/22. Due to med hold and procedure date. Med rec and consent are done.     Patient Consent for Virtual Visit        Tracey Wiley has provided verbal consent on 07/30/2022 for a virtual visit (video or telephone).   CONSENT FOR VIRTUAL VISIT FOR:  Tracey Wiley  By participating in this virtual visit I agree to the following:  I hereby voluntarily request, consent and authorize [facility] and its employed or contracted physicians, physician assistants, nurse practitioners or other licensed health care professionals (the Practitioner), to provide me with telemedicine health care services (the "Services") as deemed necessary by the treating Practitioner. I acknowledge and consent to receive the Services by the Practitioner via telemedicine. I understand that the telemedicine visit will involve communicating with the Practitioner through live audiovisual communication technology and the disclosure of certain medical information by electronic transmission. I acknowledge that I have been given the opportunity to request an in-person assessment or other available alternative prior to the telemedicine visit and am voluntarily participating in the telemedicine visit.  I understand that I have the right to withhold or withdraw my consent to the use of telemedicine in the course of my care at any time, without affecting my right to future care or treatment, and that the Practitioner or I may terminate the telemedicine visit at any time. I understand that I have the right to inspect all information obtained and/or recorded in the course of the telemedicine visit and may receive copies of available information for a reasonable fee.  I understand that some of the potential risks of receiving the Services via telemedicine include:  Delay or interruption in medical evaluation due to technological equipment failure or disruption; Information transmitted may  not be sufficient (e.g. poor resolution of images) to allow for appropriate medical decision making by the Practitioner; and/or  In rare instances, security protocols could fail, causing a breach of personal health information.  Furthermore, I acknowledge that it is my responsibility to provide information about my medical history, conditions and care that is complete and accurate to the best of my ability. I acknowledge that Practitioner's advice, recommendations, and/or decision may be based on factors not within their control, such as incomplete or inaccurate data provided by me or distortions of diagnostic images or specimens that may result from electronic transmissions. I understand that the practice of medicine is not an exact science and that Practitioner makes no warranties or guarantees regarding treatment outcomes. I acknowledge that a copy of this consent can be made available to me via my patient portal ([facility]), or I can request a printed copy by calling the office of [facility].    I understand that my insurance will be billed for this visit.   I have read or had this consent read to me. I understand the contents of this consent, which adequately explains the benefits and risks of the Services being provided via telemedicine.  I have been provided ample opportunity to ask questions regarding this consent and the Services and have had my questions answered to my satisfaction. I give my informed consent for the services to be provided through the use of telemedicine in my medical care

## 2022-07-30 NOTE — Telephone Encounter (Signed)
-----   Message from Karen Kitchens, NP sent at 07/29/2022  5:20 PM EST ----- Regarding: Request for pre-operative cardiac clearance Request for pre-operative cardiac clearance:  1. What type of surgery is being performed?  FRACTIONAL DILATATION AND CURETTAGE /HYSTEROSCOPY  2. When is this surgery scheduled?  08/06/2022  3. Type of clearance being requested (medical, pharmacy, both)? MEDICAL    4. Are there any medications that need to be held prior to surgery? ASA  5. Practice name and name of physician performing surgery?  Performing surgeon: Dr. Benjaman Kindler, MD Requesting clearance: Honor Loh, FNP-C    6. Anesthesia type (none, local, MAC, general)? GENERAL  7. What is the office phone and fax number?   Fax: [phone]  ATTENTION: Unable to create telephone message as per your standard workflow. Directed by [facility] providers to send requests for cardiac clearance to this pool for appropriate distribution to provider covering pre-operative clearances.   Honor Loh, MSN, APRN, FNP-C, CEN [facility]  Peri-operative Services Nurse Practitioner Phone: [phone] 07/29/22 5:20 PM

## 2022-07-30 NOTE — Telephone Encounter (Signed)
Pt has been scheduled for tele visit for 07/30/22. Due to med hold and procedure date. Med rec and consent are done.
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
## 2022-07-30 NOTE — Telephone Encounter (Signed)
Left message for the pt to call back to schedule a tele pre op appt 

## 2022-08-02 ENCOUNTER — Encounter: Payer: Self-pay | Admitting: Obstetrics and Gynecology
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
## 2022-08-03 ENCOUNTER — Encounter
Admission: RE | Admit: 2022-08-03 | Discharge: 2022-08-03 | Disposition: A | Discharge status: Home / Self Care | Payer: Medicare HMO | Source: Ambulatory Visit | Attending: Obstetrics and Gynecology | Admitting: Obstetrics and Gynecology

## 2022-08-03 DIAGNOSIS — R9389 Abnormal findings on diagnostic imaging of other specified body structures: Secondary | ICD-10-CM | POA: Diagnosis not present

## 2022-08-03 DIAGNOSIS — Z01812 Encounter for preprocedural laboratory examination: Secondary | ICD-10-CM | POA: Insufficient documentation

## 2022-08-03 LAB — CBC
HCT: 40.9 % (ref 36.0–46.0)
Hemoglobin: 13.4 g/dL (ref 12.0–15.0)
MCH: 28.8 pg (ref 26.0–34.0)
MCHC: 32.8 g/dL (ref 30.0–36.0)
MCV: 87.8 fL (ref 80.0–100.0)
Platelets: 293 10*3/uL (ref 150–400)
RBC: 4.66 MIL/uL (ref 3.87–5.11)
RDW: 13.4 % (ref 11.5–15.5)
WBC: 13 10*3/uL — ABNORMAL HIGH (ref 4.0–10.5)
nRBC: 0 % (ref 0.0–0.2)

## 2022-08-03 NOTE — Telephone Encounter (Signed)
Pt came by office and is checking on the status of medical clearance and to see if any additional testing needs to be done with cardiology. Pre admit has done labs and poss EKG. Please advise

## 2022-08-03 NOTE — Telephone Encounter (Signed)
Per chart review patient had pre-op telephone visit completed 07/30/22 by Diona Browner whose note states " According to the Revised Cardiac Risk Index (RCRI), her Perioperative Risk of Major Cardiac Event is (%): 0.4. Her Functional Capacity in METs is: 6.02 according to the [facility] Activity Status Index (DASI). Therefore, based on ACC/AHA guidelines, patient would be at acceptable risk for the planned procedure without further cardiovascular testing. Of note, clearance listed type of anesthesia as general. Pt states she will only undergo general anesthesia in case of an emergency and was only agreeable to this procedure if it does not involve general anesthesia. The patient was advised that if she develops new symptoms prior to surgery to contact our office to arrange for a follow-up visit, and she verbalized understanding.   Per office protocol, she may hold Aspirin for 5-7 days prior to procedure. Please resume Aspirin as soon as possible postprocedure, at the discretion of the surgeon."  No additional needs from [facility] at this time.

## 2022-08-04 ENCOUNTER — Telehealth: Payer: Self-pay | Admitting: Nurse Practitioner

## 2022-08-04 ENCOUNTER — Encounter: Payer: Self-pay | Admitting: Urgent Care

## 2022-08-04 NOTE — Telephone Encounter (Signed)
Spoke with patient and let her know that our office did not order for her to have a EKG, I doubled checked with preop provider and she stated that it was not requested from pre admissions that the patient needed one. Patient was very appreciative for the call and was told to contact preadmission to verify if pt needs EKG.

## 2022-08-04 NOTE — Telephone Encounter (Signed)
New Message:      Patient says she needs to talk to someone in Pre-Op asap please. She said she went to Pre-Admission yesterday at the hospital and they did not do the EKG.

## 2022-08-05 ENCOUNTER — Ambulatory Visit (INDEPENDENT_AMBULATORY_CARE_PROVIDER_SITE_OTHER): Payer: Medicare HMO | Admitting: Clinical

## 2022-08-05 DIAGNOSIS — F4322 Adjustment disorder with anxiety: Secondary | ICD-10-CM

## 2022-08-05 MED ORDER — LACTATED RINGERS IV SOLN: INTRAVENOUS | Status: DC

## 2022-08-05 MED ORDER — POVIDONE-IODINE 10 % EX SWAB
2.0000 | Freq: Once | CUTANEOUS | Status: AC
  Administered 2022-08-06: 2 via TOPICAL

## 2022-08-05 MED ORDER — ORAL CARE MOUTH RINSE: 15.0000 mL | Freq: Once | OROMUCOSAL | Status: AC

## 2022-08-05 MED ORDER — CHLORHEXIDINE GLUCONATE 0.12 % MT SOLN: 15.0000 mL | Freq: Once | OROMUCOSAL | Status: AC

## 2022-08-05 NOTE — Progress Notes (Signed)
Time: 12:01 pm- 12:56 pm CPT Code: [id number] Diagnosis: F43.22  Tracey Wiley was seen remotely using secure video conferencing. She was in her home and the therapist was in her office at the time of the appointment. She reflected upon challenges that had arisen in her healthcare since her last visit, including several frustrating interactions with providers and Systems analyst. She also reported that she is scheduled for surgery on 3/1. Therapist provided an opportunity to process these experiences, offering validation and support. She is scheduled to be seen again in two weeks.  Treatment Plan Client Abilities/Strengths Tracey Wiley shared that she has participated in therapy in the past and has been very honest in session.  Client Treatment Preferences:  Tracey Wiley prefers in person appointments. She prefers Tuesday and Thursday afternoon appointments. Client Statement of Needs  Tracey Wiley is seeking validation of her emotional responses and overall self-concept. Treatment Level  Weekly   Symptoms Tearfulness, irritability Problems Addressed  Goals Tracey Wiley will reduce symptoms of depression and improve overall mood 1. Tracey Wiley will increase comfort in social situations Objective  Target Date: 01/08/2023 Frequency: Weekly  Progress: 0 Modality: Individual therapy  Objective  Target Date: 01/07/2023 Frequency: Weekly  Progress: 0 Modality: individual therapy  Related Interventions  Tracey Wiley will have opportunities to process her experiences in session  Therapist will point out maladaptive thought and behavior patterns using CBT strategies. Therapist will incorporate behavior activation as appropriate. Therapist will incorporate gradual exposure therapy as appropriate. Therapist will provide referrals for additional resource as appropriate.  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

## 2022-08-05 NOTE — Pre-Procedure Instructions (Addendum)
Spoke to Pt is wanting to be sure this procedure can and will be done under propofol or versed. See OR anesthesia notes 01/2015. (Not 2018)

## 2022-08-06 ENCOUNTER — Ambulatory Visit: Payer: Medicare HMO | Admitting: Urgent Care

## 2022-08-06 ENCOUNTER — Encounter
Admission: RE | Disposition: A | Discharge status: Home / Self Care | Payer: Self-pay | Source: Ambulatory Visit | Attending: Obstetrics and Gynecology

## 2022-08-06 ENCOUNTER — Ambulatory Visit
Admission: RE | Admit: 2022-08-06 | Discharge: 2022-08-06 | Disposition: A | Discharge status: Home / Self Care | Payer: Medicare HMO | Source: Ambulatory Visit | Attending: Obstetrics and Gynecology | Admitting: Obstetrics and Gynecology

## 2022-08-06 ENCOUNTER — Encounter: Payer: Self-pay | Admitting: Obstetrics and Gynecology

## 2022-08-06 ENCOUNTER — Other Ambulatory Visit: Payer: Self-pay

## 2022-08-06 DIAGNOSIS — F4310 Post-traumatic stress disorder, unspecified: Secondary | ICD-10-CM | POA: Insufficient documentation

## 2022-08-06 DIAGNOSIS — I471 Supraventricular tachycardia, unspecified: Secondary | ICD-10-CM | POA: Diagnosis not present

## 2022-08-06 DIAGNOSIS — I5032 Chronic diastolic (congestive) heart failure: Secondary | ICD-10-CM | POA: Insufficient documentation

## 2022-08-06 DIAGNOSIS — K449 Diaphragmatic hernia without obstruction or gangrene: Secondary | ICD-10-CM | POA: Diagnosis not present

## 2022-08-06 DIAGNOSIS — Z8041 Family history of malignant neoplasm of ovary: Secondary | ICD-10-CM | POA: Insufficient documentation

## 2022-08-06 DIAGNOSIS — I110 Hypertensive heart disease with heart failure: Secondary | ICD-10-CM | POA: Diagnosis not present

## 2022-08-06 DIAGNOSIS — Z803 Family history of malignant neoplasm of breast: Secondary | ICD-10-CM | POA: Insufficient documentation

## 2022-08-06 DIAGNOSIS — Z87820 Personal history of traumatic brain injury: Secondary | ICD-10-CM | POA: Diagnosis not present

## 2022-08-06 DIAGNOSIS — R9389 Abnormal findings on diagnostic imaging of other specified body structures: Secondary | ICD-10-CM

## 2022-08-06 DIAGNOSIS — Z8049 Family history of malignant neoplasm of other genital organs: Secondary | ICD-10-CM | POA: Diagnosis not present

## 2022-08-06 DIAGNOSIS — Z01810 Encounter for preprocedural cardiovascular examination: Secondary | ICD-10-CM

## 2022-08-06 DIAGNOSIS — K219 Gastro-esophageal reflux disease without esophagitis: Secondary | ICD-10-CM | POA: Insufficient documentation

## 2022-08-06 HISTORY — DX: Sleep disorder, unspecified: G47.9

## 2022-08-06 HISTORY — DX: Other ill-defined heart diseases: I51.89

## 2022-08-06 HISTORY — DX: Post-traumatic stress disorder, unspecified: F43.10

## 2022-08-06 HISTORY — PX: HYSTEROSCOPY WITH D & C: SHX1775

## 2022-08-06 HISTORY — DX: Panic disorder (episodic paroxysmal anxiety): F41.0

## 2022-08-06 HISTORY — DX: Atherosclerosis of aorta: I70.0

## 2022-08-06 HISTORY — DX: Polymyalgia rheumatica: M35.3

## 2022-08-06 HISTORY — DX: Other supraventricular tachycardia: I47.19

## 2022-08-06 HISTORY — DX: Herpesviral infection of urogenital system, unspecified: A60.00

## 2022-08-06 HISTORY — DX: Long term (current) use of systemic steroids: Z79.52

## 2022-08-06 SURGERY — DILATATION AND CURETTAGE /HYSTEROSCOPY
Anesthesia: General

## 2022-08-06 MED ORDER — PROPOFOL 10 MG/ML IV BOLUS
INTRAVENOUS | Status: DC | PRN
  Administered 2022-08-06: 150 mg via INTRAVENOUS

## 2022-08-06 MED ORDER — FENTANYL CITRATE (PF) 100 MCG/2ML IJ SOLN
INTRAMUSCULAR | Status: AC
  Filled 2022-08-06: qty 2

## 2022-08-06 MED ORDER — SUCCINYLCHOLINE CHLORIDE 200 MG/10ML IV SOSY
PREFILLED_SYRINGE | INTRAVENOUS | Status: AC
  Filled 2022-08-06: qty 10

## 2022-08-06 MED ORDER — ACETAMINOPHEN 10 MG/ML IV SOLN
INTRAVENOUS | Status: AC
  Filled 2022-08-06: qty 100

## 2022-08-06 MED ORDER — MIDAZOLAM HCL 2 MG/2ML IJ SOLN
INTRAMUSCULAR | Status: AC
  Filled 2022-08-06: qty 2

## 2022-08-06 MED ORDER — PROPOFOL 10 MG/ML IV BOLUS
INTRAVENOUS | Status: AC
  Filled 2022-08-06: qty 20

## 2022-08-06 MED ORDER — MIDAZOLAM HCL 2 MG/2ML IJ SOLN
INTRAMUSCULAR | Status: DC | PRN
  Administered 2022-08-06: 2 mg via INTRAVENOUS

## 2022-08-06 MED ORDER — CHLORHEXIDINE GLUCONATE 0.12 % MT SOLN
OROMUCOSAL | Status: AC
  Administered 2022-08-06: 15 mL via OROMUCOSAL
  Filled 2022-08-06: qty 15

## 2022-08-06 MED ORDER — ONDANSETRON HCL 4 MG/2ML IJ SOLN
INTRAMUSCULAR | Status: AC
  Filled 2022-08-06: qty 2

## 2022-08-06 MED ORDER — PROPOFOL 1000 MG/100ML IV EMUL
INTRAVENOUS | Status: AC
  Filled 2022-08-06: qty 100

## 2022-08-06 MED ORDER — ONDANSETRON HCL 4 MG/2ML IJ SOLN
INTRAMUSCULAR | Status: DC | PRN
  Administered 2022-08-06: 4 mg via INTRAVENOUS

## 2022-08-06 MED ORDER — FENTANYL CITRATE (PF) 100 MCG/2ML IJ SOLN: 25.0000 ug | INTRAMUSCULAR | Status: DC | PRN

## 2022-08-06 MED ORDER — LIDOCAINE HCL (CARDIAC) PF 100 MG/5ML IV SOSY
PREFILLED_SYRINGE | INTRAVENOUS | Status: DC | PRN
  Administered 2022-08-06: 100 mg via INTRAVENOUS

## 2022-08-06 MED ORDER — FENTANYL CITRATE (PF) 100 MCG/2ML IJ SOLN
INTRAMUSCULAR | Status: DC | PRN
  Administered 2022-08-06: 25 ug via INTRAVENOUS

## 2022-08-06 MED ORDER — ACETAMINOPHEN 10 MG/ML IV SOLN
INTRAVENOUS | Status: DC | PRN
  Administered 2022-08-06: 1000 mg via INTRAVENOUS

## 2022-08-06 MED ORDER — DEXAMETHASONE SODIUM PHOSPHATE 10 MG/ML IJ SOLN
INTRAMUSCULAR | Status: DC | PRN
  Administered 2022-08-06: 10 mg via INTRAVENOUS

## 2022-08-06 MED ORDER — DEXMEDETOMIDINE HCL IN NACL 80 MCG/20ML IV SOLN
INTRAVENOUS | Status: DC | PRN
  Administered 2022-08-06: 8 ug via INTRAVENOUS
  Administered 2022-08-06: 4 ug via INTRAVENOUS
  Administered 2022-08-06: 8 ug via INTRAVENOUS

## 2022-08-06 MED ORDER — LIDOCAINE HCL (PF) 2 % IJ SOLN
INTRAMUSCULAR | Status: AC
  Filled 2022-08-06: qty 5

## 2022-08-06 MED ORDER — OXYCODONE HCL 5 MG/5ML PO SOLN: 5.0000 mg | Freq: Once | ORAL | Status: DC | PRN

## 2022-08-06 MED ORDER — OXYCODONE HCL 5 MG PO TABS: 5.0000 mg | ORAL_TABLET | Freq: Once | ORAL | Status: DC | PRN

## 2022-08-06 MED ORDER — PROPOFOL 500 MG/50ML IV EMUL
INTRAVENOUS | Status: DC | PRN
  Administered 2022-08-06: 175 ug/kg/min via INTRAVENOUS

## 2022-08-06 SURGICAL SUPPLY — 21 items
BAG PRESSURE INF REUSE 1000 (BAG) ×1 IMPLANT
BASIN KIT SINGLE STR (MISCELLANEOUS) ×1 IMPLANT
DRSG TELFA 3X8 NADH STRL (GAUZE/BANDAGES/DRESSINGS) IMPLANT
ELECT REM PT RETURN 9FT ADLT (ELECTROSURGICAL) ×1
ELECTRODE REM PT RTRN 9FT ADLT (ELECTROSURGICAL) ×1 IMPLANT
GLOVE BIO SURGEON STRL SZ7 (GLOVE) ×1 IMPLANT
GLOVE SURG UNDER LTX SZ7.5 (GLOVE) ×1 IMPLANT
GOWN STRL REUS W/ TWL LRG LVL3 (GOWN DISPOSABLE) ×2 IMPLANT
GOWN STRL REUS W/TWL LRG LVL3 (GOWN DISPOSABLE) ×2
IV NS IRRIG 3000ML ARTHROMATIC (IV SOLUTION) ×1 IMPLANT
KIT PROCEDURE FLUENT (KITS) ×1 IMPLANT
KIT TURNOVER CYSTO (KITS) ×1 IMPLANT
MANIFOLD NEPTUNE II (INSTRUMENTS) ×1 IMPLANT
PACK DNC HYST (MISCELLANEOUS) ×1 IMPLANT
PAD PREP 24X41 OB/GYN DISP (PERSONAL CARE ITEMS) ×1 IMPLANT
SCRUB CHG 4% DYNA-HEX 4OZ (MISCELLANEOUS) ×1 IMPLANT
SEAL ROD LENS SCOPE MYOSURE (ABLATOR) ×1 IMPLANT
SET CYSTO W/LG BORE CLAMP LF (SET/KITS/TRAYS/PACK) IMPLANT
TRAP FLUID SMOKE EVACUATOR (MISCELLANEOUS) ×1 IMPLANT
TUBING CONNECTING 10 (TUBING) ×1 IMPLANT
WATER STERILE IRR 500ML POUR (IV SOLUTION) ×1 IMPLANT

## 2022-08-06 NOTE — Anesthesia Postprocedure Evaluation (Signed)
Anesthesia Post Note  Patient: Tracey Wiley  Procedure(s) Performed: FRACTIONAL DILATATION AND CURETTAGE /HYSTEROSCOPY  Patient location during evaluation: PACU Anesthesia Type: General Level of consciousness: awake and alert Pain management: pain level controlled Vital Signs Assessment: post-procedure vital signs reviewed and stable Respiratory status: spontaneous breathing, nonlabored ventilation, respiratory function stable and patient connected to nasal cannula oxygen Cardiovascular status: blood pressure returned to baseline and stable Postop Assessment: no apparent nausea or vomiting Anesthetic complications: no   No notable events documented.   Last Vitals:  Vitals:   08/06/22 1118 08/06/22 1130  BP: 138/80 138/70  Pulse: 67 64  Resp: 19 14  Temp: (!) 35.9 C   SpO2: 99% 100%    Last Pain:  Vitals:   08/06/22 1118  TempSrc:   PainSc: Asleep                 Ilene Qua
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
## 2022-08-06 NOTE — Transfer of Care (Signed)
Immediate Anesthesia Transfer of Care Note  Patient: Tracey Wiley  Procedure(s) Performed: FRACTIONAL DILATATION AND CURETTAGE /HYSTEROSCOPY  Patient Location: PACU  Anesthesia Type:General  Level of Consciousness: drowsy and patient cooperative  Airway & Oxygen Therapy: Patient Spontanous Breathing and Patient connected to face mask oxygen  Post-op Assessment: Report given to RN and Post -op Vital signs reviewed and stable  Post vital signs: Reviewed and stable  Last Vitals:  Vitals Value Taken Time  BP 138/80 (98) 08/06/22 1117  Temp    Pulse 67 08/06/22 1117  Resp 19 08/06/22 1117  SpO2 99 % 08/06/22 1117  Vitals shown include unvalidated device data.  Last Pain:  Vitals:   08/06/22 0847  TempSrc: Oral  PainSc: 0-No pain         Complications: No notable events documented.
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
## 2022-08-06 NOTE — Anesthesia Procedure Notes (Signed)
Procedure Name: LMA Insertion Date/Time: 08/06/2022 10:08 AM  Performed by: Doreen Salvage, CRNAPre-anesthesia Checklist: Patient identified, Patient being monitored, Timeout performed, Emergency Drugs available and Suction available Patient Re-evaluated:Patient Re-evaluated prior to induction Oxygen Delivery Method: Circle system utilized Preoxygenation: Pre-oxygenation with 100% oxygen Induction Type: IV induction Ventilation: Mask ventilation without difficulty LMA: LMA inserted LMA Size: 4.0 Tube type: Oral Number of attempts: 1 Placement Confirmation: positive ETCO2 and breath sounds checked- equal and bilateral Tube secured with: Tape Dental Injury: Teeth and Oropharynx as per pre-operative assessment

## 2022-08-06 NOTE — Op Note (Signed)
Operative Report Hysteroscopy with Dilation and Curettage   Indications: Family hx of lynch syndrome  Pre-operative Diagnosis: Thickened endometrial stripe    Post-operative Diagnosis: same.  Procedure: 1. Exam under anesthesia 2. Fractional D&C 3. Hysteroscopy  Surgeon: Benjaman Kindler, MD  Assistant(s):  None  Anesthesia: Monitored Local Anesthesia with Sedation  Anesthesiologist: No responsible provider has been recorded for the case. Anesthesiologist: Ilene Qua, MD CRNA: Doreen Salvage, CRNA; Einar Pheasant B, CRNA  Estimated Blood Loss:  Minimal  Total IV Fluids: 851m  Urine Output: 2044m Total Fluid Deficit:  175 mL          Specimens: Endocervical curettings, endometrial curettings         Complications:  None; patient tolerated the procedure well.         Disposition: PACU - hemodynamically stable.         Condition: stable  Findings: Uterus measuring 7 cm by sound; normal cervix, vagina, perineum. Very difficult cervix to dilate, and I'm not sure I was able to get to the endometrial cavity. There was no other passage though and I sampled the most distal end of the cervical cavity. I was unable to see bilateral tubal ostia clearly. It appeared that I was in myometrium but not consistently at the end of the scope.  Indication for procedure/Consents: 6418.o. G1P0010  here for scheduled surgery for the aforementioned diagnoses.   Risks of surgery were discussed with the patient including but not limited to: bleeding which may require transfusion; infection which may require antibiotics; injury to uterus or surrounding organs; intrauterine scarring which may impair future fertility; need for additional procedures including laparotomy or laparoscopy; and other postoperative/anesthesia complications. Written informed consent was obtained.    Procedure Details:  Fractional D&C only  The patient was taken to the operating room where anesthesia was administered  and was found to be adequate.  After a formal and adequate timeout was performed, she was placed in the dorsal lithotomy position and examined with the above findings. She was then prepped and draped in the sterile manner.   Her bladder was catheterized for an estimated amount of clear, yellow urine. A weighed speculum was then placed in the patient's vagina and a single tooth tenaculum was applied to the anterior lip of the cervix.  An ECC was performed, but only 4cm in. I was able to dilate by a few cm at a time using hydrodissection/dilation with the scope, taking great care.  Her cervix was serially dilated to 15 FrPakistansing Hanks dilators.  The hysteroscope was introduced to reveal the above findings. I used a urology hysteroscopic grasper to probe for a passage.  A sharp curettage was then gently performed in all four quadrants.  The tenaculum was removed from the anterior lip of the cervix and the vaginal speculum was removed after applying pressure for good hemostasis.   The patient tolerated the procedure well and was taken to the recovery area awake and in stable condition. She received iv acetaminophen and Toradol prior to leaving the OR.  The patient will be discharged to home as per PACU criteria. Routine postoperative instructions given.  She was prescribed Ibuprofen and Colace.  She will follow up in the clinic in two weeks for postoperative evaluation.

## 2022-08-06 NOTE — Discharge Instructions (Addendum)
Discharge instructions after a hysteroscopy with dilation and curettage  Signs and Symptoms to Report  Call our office at [phone] if you have any of the following:    Fever over 100.4 degrees or higher  Severe stomach pain not relieved with pain medications  Bright red bleeding that's heavier than a period that does not slow with rest after the first 24 hours  To go the bathroom a lot (frequency), you can't hold your urine (urgency), or it hurts when you empty your bladder (urinate)  Chest pain  Shortness of breath  Pain in the calves of your legs  Severe nausea and vomiting not relieved with anti-nausea medications  Any concerns  What You Can Expect after Surgery  You may see some pink tinged, bloody fluid. This is normal. You may also have cramping for several days.   Activities after Your Discharge Follow these guidelines to help speed your recovery at home:  Don't drive if you are in pain or taking narcotic pain medicine. You may drive when you can safely slam on the brakes, turn the wheel forcefully, and rotate your torso comfortably. This is typically 4-7 days. Practice in a parking lot or side street prior to attempting to drive regularly.   Ask others to help with household chores for 4 weeks.  Don't do strenuous activities, exercises, or sports like vacuuming, tennis, squash, etc. until your doctor says it is safe to do so.  Walk as you feel able. Rest often since it may take a week or two for your energy level to return to normal.   You may climb stairs  Avoid constipation:   -Eat fruits, vegetables, and whole grains. Eat small meals as your appetite will take time to return to normal.   -Drink 6 to 8 glasses of water each day unless your doctor has told you to limit your fluids.   -Use a laxative or stool softener as needed if constipation becomes a problem. You may take Miralax, metamucil, Citrucil, Colace, Senekot, FiberCon, etc. If this does not relieve the  constipation, try two tablespoons of Milk Of Magnesia every 8 hours until your bowels move.   You may shower.   Do not get in a hot tub, swimming pool, etc. until your doctor agrees.  Do not douche, use tampons, or have sex until your doctor says it is okay, usually about 2 weeks.  Take your pain medicine when you need it. The medicine may not work as well if the pain is bad.  Take the medicines you were taking before surgery. Other medications you might need are pain medications (ibuprofen), medications for constipation (Colace) and nausea medications (Zofran).      AMBULATORY SURGERY  DISCHARGE INSTRUCTIONS   The drugs that you were given will stay in your system until tomorrow so for the next 24 hours you should not:  Drive an automobile Make any legal decisions Drink any alcoholic beverage   You may resume regular meals tomorrow.  Today it is better to start with liquids and gradually work up to solid foods.  You may eat anything you prefer, but it is better to start with liquids, then soup and crackers, and gradually work up to solid foods.   Please notify your doctor immediately if you have any unusual bleeding, trouble breathing, redness and pain at the surgery site, drainage, fever, or pain not relieved by medication.    Additional Instructions:        Please contact your physician  with any problems or Same Day Surgery at [phone], Monday through Friday 6 am to 4 pm, or [place] at [facility] number at [phone].

## 2022-08-07 ENCOUNTER — Encounter: Payer: Self-pay | Admitting: Obstetrics and Gynecology

## 2022-08-11 ENCOUNTER — Other Ambulatory Visit: Payer: Self-pay

## 2022-08-11 ENCOUNTER — Other Ambulatory Visit: Payer: Self-pay | Admitting: Internal Medicine

## 2022-08-11 MED ORDER — COLCHICINE 0.6 MG PO TABS: 0.6000 mg | ORAL_TABLET | Freq: Two times a day (BID) | ORAL | 0 refills | Status: DC | PRN

## 2022-08-13 LAB — SURGICAL PATHOLOGY

## 2022-08-19 ENCOUNTER — Ambulatory Visit (INDEPENDENT_AMBULATORY_CARE_PROVIDER_SITE_OTHER): Payer: Medicare HMO | Admitting: Clinical

## 2022-08-19 DIAGNOSIS — F4322 Adjustment disorder with anxiety: Secondary | ICD-10-CM

## 2022-08-19 NOTE — Progress Notes (Signed)
Time: 12:01 pm- 12:56 pm CPT Code: [id number] Diagnosis: F43.22  Tracey Wiley was seen in person for individual therapy. She reported that her surgery had gone well, and session  began by processing this experience. Session also focused on recent events in her relationships, and the grief she has been experiencing related to the one-year anniversary of her dog's death. Therapist suggested looking for ways to honor her dog's memory. She is scheduled to be seen again in one week.  Treatment Plan Client Abilities/Strengths Tracey Wiley shared that she has participated in therapy in the past and has been very honest in session.  Client Treatment Preferences:  Tracey Wiley prefers in person appointments. She prefers Tuesday and Thursday afternoon appointments. Client Statement of Needs  Tracey Wiley is seeking validation of her emotional responses and overall self-concept. Treatment Level  Weekly   Symptoms Tearfulness, irritability Problems Addressed  Goals Tracey Wiley will reduce symptoms of depression and improve overall mood 1. Tracey Wiley will increase comfort in social situations Objective  Target Date: 01/08/2023 Frequency: Weekly  Progress: 0 Modality: Individual therapy  Objective  Target Date: 01/07/2023 Frequency: Weekly  Progress: 0 Modality: individual therapy  Related Interventions  Tracey Wiley will have opportunities to process her experiences in session  Therapist will point out maladaptive thought and behavior patterns using CBT strategies. Therapist will incorporate behavior activation as appropriate. Therapist will incorporate gradual exposure therapy as appropriate. Therapist will provide referrals for additional resource as appropriate.  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

## 2022-08-26 ENCOUNTER — Ambulatory Visit: Payer: Medicare HMO | Admitting: Physician Assistant

## 2022-08-26 ENCOUNTER — Ambulatory Visit (INDEPENDENT_AMBULATORY_CARE_PROVIDER_SITE_OTHER): Payer: Medicare HMO | Admitting: Clinical

## 2022-08-26 ENCOUNTER — Encounter: Payer: Self-pay | Admitting: Physician Assistant

## 2022-08-26 VITALS — BP 130/78 | HR 68 | Ht 65.0 in | Wt 231.0 lb

## 2022-08-26 DIAGNOSIS — F4322 Adjustment disorder with anxiety: Secondary | ICD-10-CM | POA: Diagnosis not present

## 2022-08-26 DIAGNOSIS — K219 Gastro-esophageal reflux disease without esophagitis: Secondary | ICD-10-CM | POA: Diagnosis not present

## 2022-08-26 DIAGNOSIS — R194 Change in bowel habit: Secondary | ICD-10-CM | POA: Diagnosis not present

## 2022-08-26 DIAGNOSIS — R1084 Generalized abdominal pain: Secondary | ICD-10-CM

## 2022-08-26 LAB — HM PAP SMEAR: HM Pap smear: NORMAL

## 2022-08-26 MED ORDER — ESOMEPRAZOLE MAGNESIUM 40 MG PO CPDR: 40.0000 mg | DELAYED_RELEASE_CAPSULE | Freq: Every day | ORAL | 3 refills | Status: DC

## 2022-08-26 MED ORDER — FAMOTIDINE 20 MG PO TABS: 20.0000 mg | ORAL_TABLET | Freq: Every day | ORAL | 3 refills | Status: DC

## 2022-08-26 NOTE — Progress Notes (Signed)
Time: 12:01 pm- 12:56 pm CPT Code: [id number] Diagnosis: F43.22  Tracey Wiley was seen in person for individual therapy. She reported several developments in her health and relationships since the last session. Therapist offered validation and support, as well as suggested options for healthy boundary setting. She is scheduled to be seen again in one week.  Treatment Plan Client Abilities/Strengths Tracey Wiley shared that she has participated in therapy in the past and has been very honest in session.  Client Treatment Preferences:  Tracey Wiley prefers in person appointments. She prefers Tuesday and Thursday afternoon appointments. Client Statement of Needs  Tracey Wiley is seeking validation of her emotional responses and overall self-concept. Treatment Level  Weekly   Symptoms Tearfulness, irritability Problems Addressed  Goals Tracey Wiley will reduce symptoms of depression and improve overall mood 1. Tracey Wiley will increase comfort in social situations Objective  Target Date: 01/08/2023 Frequency: Weekly  Progress: 0 Modality: Individual therapy  Objective  Target Date: 01/07/2023 Frequency: Weekly  Progress: 0 Modality: individual therapy  Related Interventions  Tracey Wiley will have opportunities to process her experiences in session  Therapist will point out maladaptive thought and behavior patterns using CBT strategies. Therapist will incorporate behavior activation as appropriate. Therapist will incorporate gradual exposure therapy as appropriate. Therapist will provide referrals for additional resource as appropriate.  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

## 2022-08-26 NOTE — Patient Instructions (Addendum)
_______________________________________________________  If your blood pressure at your visit was 140/90 or greater, please contact your primary care physician to follow up on this.  _______________________________________________________  If you are age 65 or older, your body mass index should be between 23-30. Your Body mass index is 38.44 kg/m. If this is out of the aforementioned range listed, please consider follow up with your Primary Care Provider.  If you are age 10 or younger, your body mass index should be between 19-25. Your Body mass index is 38.44 kg/m. If this is out of the aformentioned range listed, please consider follow up with your Primary Care Provider.   ________________________________________________________  The [facility] providers would like to encourage you to use [facility] to communicate with providers for non-urgent requests or questions.  Due to long hold times on the telephone, sending your provider a message by [facility] may be a faster and more efficient way to get a response.  Please allow 48 business hours for a response.  Please remember that this is for non-urgent requests.  _______________________________________________________  We have sent the following medications to your pharmacy for you to pick up at your convenience: Nexium Pepcid  Please purchase the following medications over the counter and take as directed: Fiber gummies 1-2 a day  Please follow up in 12 months. Give [place] a call at [phone] to schedule an appointment.   It was a pleasure to see you today!  Thank you for trusting me with your gastrointestinal care!
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
## 2022-08-30 ENCOUNTER — Encounter: Payer: Self-pay | Admitting: Internal Medicine

## 2022-08-30 ENCOUNTER — Telehealth: Payer: Self-pay | Admitting: Internal Medicine

## 2022-08-30 DIAGNOSIS — R7301 Impaired fasting glucose: Secondary | ICD-10-CM

## 2022-08-30 DIAGNOSIS — E782 Mixed hyperlipidemia: Secondary | ICD-10-CM

## 2022-08-30 NOTE — Telephone Encounter (Signed)
See phone note

## 2022-08-30 NOTE — Telephone Encounter (Signed)
Tracey Wiley  to [facility] (supporting Crecencio Mc, MD)      08/30/22 12:19 PM I believe you need full details. From 10:08 to 10:26 this morning I had a jagged zig-zag very colorful curved left line in my visual field. It started small and in seconds it appeared to run from the top of my head to below my chin. It was on the left but I could see it with both eyes. I covered one eye at a time and it was still there. It was like little lines (squares/rectangles) and they were flashing. The tippy top appeared black and white. ~~ I feel normal otherwise. I have not hit my head or fallen. ~~ I have been more stressed than normal after having Covid (for the first time ever) most of February and then a uterine biopsy on March 1st. ~~ Additionally, April 2nd will be the one year anniversary of [name]'s passing and it is heavy on my mind. ~~ You know I am not an alarmist but this was scary. Please advise what steps I need to take next if any.  Thank you,  Tracey Wiley

## 2022-08-30 NOTE — Telephone Encounter (Signed)
Called pt and spoke with her about the message below. Pt stated that this morning while putting clothes away she all of a sudden had this colorful zig zag light in both of her eyes that lasted for about 20 minutes. Pt stated that she did not have numbness, weakness slurred speech or a headache during this time. Pt was asked if she has reached out to her eye doctor and she stated that she would call them first thing in the morning but wanted to see what Dr. Lupita Dawn opinion was. No appts available.

## 2022-08-30 NOTE — Telephone Encounter (Signed)
Pt called stating this morning she saw a zigzag colorful long curve line with both of her eyes and it lasted for twenty minutes. Pt would like to be called

## 2022-08-31 NOTE — Telephone Encounter (Signed)
Left a detailed message per pt request with the information below.

## 2022-09-02 ENCOUNTER — Ambulatory Visit (INDEPENDENT_AMBULATORY_CARE_PROVIDER_SITE_OTHER): Payer: Medicare HMO | Admitting: Clinical

## 2022-09-02 DIAGNOSIS — F4322 Adjustment disorder with anxiety: Secondary | ICD-10-CM

## 2022-09-02 NOTE — Progress Notes (Signed)
Time: 1:01 pm- 1:56 pm CPT Code: [id number] Diagnosis: F43.22  Tracey Wiley was seen in person for individual therapy. She arrived at session upset, having had an argument with her significant other during the drive over. Session focused on processing the argument and creating a plan for after the session. She is scheduled to be seen again in one week.  Treatment Plan Client Abilities/Strengths Tracey Wiley shared that she has participated in therapy in the past and has been very honest in session.  Client Treatment Preferences:  Tracey Wiley prefers in person appointments. She prefers Tuesday and Thursday afternoon appointments. Client Statement of Needs  Tracey Wiley is seeking validation of her emotional responses and overall self-concept. Treatment Level  Weekly   Symptoms Tearfulness, irritability Problems Addressed  Goals Tracey Wiley will reduce symptoms of depression and improve overall mood 1. Tracey Wiley will increase comfort in social situations Objective  Target Date: 01/08/2023 Frequency: Weekly  Progress: 0 Modality: Individual therapy  Objective  Target Date: 01/07/2023 Frequency: Weekly  Progress: 0 Modality: individual therapy  Related Interventions  Tracey Wiley will have opportunities to process her experiences in session  Therapist will point out maladaptive thought and behavior patterns using CBT strategies. Therapist will incorporate behavior activation as appropriate. Therapist will incorporate gradual exposure therapy as appropriate. Therapist will provide referrals for additional resource as appropriate.  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

## 2022-09-07 NOTE — Telephone Encounter (Signed)
I have ordered a cmp, lipid, ldl, and A1c. Is there anything else that you like for pt to have checked when she goes over to [facility] to have the labs drawn.

## 2022-09-09 ENCOUNTER — Ambulatory Visit (INDEPENDENT_AMBULATORY_CARE_PROVIDER_SITE_OTHER): Payer: Medicare HMO | Admitting: Clinical

## 2022-09-09 DIAGNOSIS — F4322 Adjustment disorder with anxiety: Secondary | ICD-10-CM | POA: Diagnosis not present

## 2022-09-09 NOTE — Progress Notes (Signed)
Time: 12:01 pm- 12:56 pm CPT Code: [id number] Diagnosis: F43.22  Tracey Wiley was seen in person for individual therapy. Session focused on continuing to process events in her relationship. Therapist suggested encouraging her partner to seek therapy focused on anger management, and offered to provide referrals. She is scheduled to be seen again in one week.  Treatment Plan Client Abilities/Strengths Tracey Wiley shared that she has participated in therapy in the past and has been very honest in session.  Client Treatment Preferences:  Tracey Wiley prefers in person appointments. She prefers Tuesday and Thursday afternoon appointments. Client Statement of Needs  Tracey Wiley is seeking validation of her emotional responses and overall self-concept. Treatment Level  Weekly   Symptoms Tearfulness, irritability Problems Addressed  Goals Tracey Wiley will reduce symptoms of depression and improve overall mood 1. Tracey Wiley will increase comfort in social situations Objective  Target Date: 01/08/2023 Frequency: Weekly  Progress: 0 Modality: Individual therapy  Objective  Target Date: 01/07/2023 Frequency: Weekly  Progress: 0 Modality: individual therapy  Related Interventions  [place] will have opportunities to process her experiences in session  Therapist will point out maladaptive thought and behavior patterns using CBT strategies. Therapist will incorporate behavior activation as appropriate. Therapist will incorporate gradual exposure therapy as appropriate. Therapist will provide referrals for additional resource as appropriate.  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

## 2022-09-16 ENCOUNTER — Ambulatory Visit (INDEPENDENT_AMBULATORY_CARE_PROVIDER_SITE_OTHER): Payer: Medicare HMO | Admitting: Clinical

## 2022-09-16 DIAGNOSIS — F4322 Adjustment disorder with anxiety: Secondary | ICD-10-CM

## 2022-09-16 NOTE — Progress Notes (Signed)
Time: 12:05 pm- 12:58 pm CPT Code: [id number] Diagnosis: F43.22  Tracey Wiley was seen remotely using secure video conferencing. She was in her home and the therapist was in her office at the time of the appointment. Session focused on her frustration with continued uncertainty as to underlying causes of her chronic pain. She also reflected upon feelings of anger related to side effects of medication she had been mistakenly prescribed that had negatively impacted her during the last 7 months of her dogs' life. Therapist encouraged her to consider alternate interpretations (for example, that the extra rest delayed onset of seizures for her dog). She agreed to try this, and is scheduled to be seen again in one week.  Treatment Plan Client Abilities/Strengths Tracey Wiley shared that she has participated in therapy in the past and has been very honest in session.  Client Treatment Preferences:  Tracey Wiley prefers in person appointments. She prefers Tuesday and Thursday afternoon appointments. Client Statement of Needs  Tracey Wiley is seeking validation of her emotional responses and overall self-concept. Treatment Level  Weekly   Symptoms Tearfulness, irritability Problems Addressed  Goals Tracey Wiley will reduce symptoms of depression and improve overall mood 1. Tracey Wiley will increase comfort in social situations Objective  Target Date: 01/08/2023 Frequency: Weekly  Progress: 0 Modality: Individual therapy  Objective  Target Date: 01/07/2023 Frequency: Weekly  Progress: 0 Modality: individual therapy  Related Interventions  Tracey Wiley will have opportunities to process her experiences in session  Therapist will point out maladaptive thought and behavior patterns using CBT strategies. Therapist will incorporate behavior activation as appropriate. Therapist will incorporate gradual exposure therapy as appropriate. Therapist will provide referrals for additional resource as appropriate.  Diagnosis Axis none Adjustment Disorder with Anxiety and  Depression (F43.23)   Axis none    Conditions For Discharge Achievement of treatment goals and objectives       Tracey Noa, PhD               Tracey Noa, PhD               Tracey Noa, PhD

## 2022-09-23 ENCOUNTER — Ambulatory Visit (INDEPENDENT_AMBULATORY_CARE_PROVIDER_SITE_OTHER): Payer: Medicare HMO | Admitting: Clinical

## 2022-09-23 DIAGNOSIS — F4322 Adjustment disorder with anxiety: Secondary | ICD-10-CM

## 2022-09-23 NOTE — Progress Notes (Signed)
Time: 12:05 pm- 12:58 pm CPT Code: 16109U Diagnosis: F43.22  Juhi was seen remotely using secure video conferencing. She was in her home and the therapist was in her home at the time of the appointment. Session focused on further unpacking issues in her relationship. She shed light on her partner's background, and therapist suggested resources to help him cope with past traumas. She  is scheduled to be seen again in one week.  Treatment Plan Client Abilities/Strengths Deshonda shared that she has participated in therapy in the past and has been very honest in session.  Client Treatment Preferences:  Rosamond prefers in person appointments. She prefers Tuesday and Thursday afternoon appointments. Client Statement of Needs  Rowe is seeking validation of her emotional responses and overall self-concept. Treatment Level  Weekly   Symptoms Tearfulness, irritability Problems Addressed  Goals Navaeh will reduce symptoms of depression and improve overall mood 1. Alante will increase comfort in social situations Objective  Target Date: 01/08/2023 Frequency: Weekly  Progress: 0 Modality: Individual therapy  Objective  Target Date: 01/07/2023 Frequency: Weekly  Progress: 0 Modality: individual therapy  Related Interventions  Adelie will have opportunities to process her experiences in session  Therapist will point out maladaptive thought and behavior patterns using CBT strategies. Therapist will incorporate behavior activation as appropriate. Therapist will incorporate gradual exposure therapy as appropriate. Therapist will provide referrals for additional resource as appropriate.  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

## 2022-09-30 ENCOUNTER — Ambulatory Visit (INDEPENDENT_AMBULATORY_CARE_PROVIDER_SITE_OTHER): Payer: Medicare HMO | Admitting: Clinical

## 2022-09-30 DIAGNOSIS — F4322 Adjustment disorder with anxiety: Secondary | ICD-10-CM | POA: Diagnosis not present

## 2022-09-30 NOTE — Progress Notes (Signed)
Time: 12:01 pm- 12:58 pm CPT Code: [id number] Diagnosis: F43.22  Tracey Wiley was seen remotely using secure video conferencing. She was in her home and the therapist was in her office at the time of the appointment. Session focused on issues in her relationship related to her partner's consideration of taking on a third. Therapist provided an opportunity to process and suggested communication strategies. She  is scheduled to be seen again in one week.  Treatment Plan Client Abilities/Strengths Tracey Wiley shared that she has participated in therapy in the past and has been very honest in session.  Client Treatment Preferences:  Tracey Wiley prefers in person appointments. She prefers Tuesday and Thursday afternoon appointments. Client Statement of Needs  Tracey Wiley is seeking validation of her emotional responses and overall self-concept. Treatment Level  Weekly   Symptoms Tearfulness, irritability Problems Addressed  Goals Tracey Wiley will reduce symptoms of depression and improve overall mood 1. Tracey Wiley will increase comfort in social situations Objective  Target Date: 01/08/2023 Frequency: Weekly  Progress: 0 Modality: Individual therapy  Objective  Target Date: 01/07/2023 Frequency: Weekly  Progress: 0 Modality: individual therapy  Related Interventions  Tracey Wiley will have opportunities to process her experiences in session  Therapist will point out maladaptive thought and behavior patterns using CBT strategies. Therapist will incorporate behavior activation as appropriate. Therapist will incorporate gradual exposure therapy as appropriate. Therapist will provide referrals for additional resource as appropriate.  Diagnosis Axis none Adjustment Disorder with Anxiety and Depression (F43.23)   Axis none    Conditions For Discharge Achievement of treatment goals and objectives       Tracey Noa, PhD               Tracey Noa, PhD

## 2022-10-07 ENCOUNTER — Ambulatory Visit (INDEPENDENT_AMBULATORY_CARE_PROVIDER_SITE_OTHER): Payer: Medicare HMO | Admitting: Clinical

## 2022-10-07 DIAGNOSIS — F4322 Adjustment disorder with anxiety: Secondary | ICD-10-CM | POA: Diagnosis not present

## 2022-10-07 NOTE — Progress Notes (Signed)
Time: 12:01 pm- 12:58 pm CPT Code: [id number] Diagnosis: F43.22  Kess was seen in person for therapy. She reflected upon her progress since her dog passed away one year prior, and shared developments in her relationship. She expressed that she finds it difficult to have dreams for the future, and therapist suggested exploring this further. She is scheduled to be seen again in one week.  Treatment Plan Client Abilities/Strengths Tomeca shared that she has participated in therapy in the past and has been very honest in session.  Client Treatment Preferences:  Preethi prefers in person appointments. She prefers Tuesday and Thursday afternoon appointments. Client Statement of Needs  Valla is seeking validation of her emotional responses and overall self-concept. Treatment Level  Weekly   Symptoms Tearfulness, irritability Problems Addressed  Goals Trilby will reduce symptoms of depression and improve overall mood 1. Sathvika will increase comfort in social situations Objective  Target Date: 01/08/2023 Frequency: Weekly  Progress: 0 Modality: Individual therapy  Objective  Target Date: 01/07/2023 Frequency: Weekly  Progress: 0 Modality: individual therapy  Related Interventions  Vita will have opportunities to process her experiences in session  Therapist will point out maladaptive thought and behavior patterns using CBT strategies. Therapist will incorporate behavior activation as appropriate. Therapist will incorporate gradual exposure therapy as appropriate. Therapist will provide referrals for additional resource as appropriate.  Diagnosis Axis none Adjustment Disorder with Anxiety and Depression (F43.23)   Axis none    Conditions For Discharge Achievement of treatment goals and objectives                  [name], PhD               [name], PhD

## 2022-10-12 ENCOUNTER — Telehealth: Payer: Self-pay | Admitting: Cardiovascular Disease

## 2022-10-12 NOTE — Telephone Encounter (Signed)
Pt called to report off and on SOB x 2-3 weeks She stated symptoms usually happen after she eat or bending over unloading dish washer or dryer. Pt stated symptoms doesn't occur daily. Pt stated however, she rides her back or dance with her husband who is a Marketing executive, and doesn't experience any SOB.  Pt also reported bilateral feet swelling.  Appointment scheduled for 5/15 for further evaluations.

## 2022-10-12 NOTE — Telephone Encounter (Signed)
Pt c/o Shortness Of Breath: STAT if SOB developed within the last 24 hours or pt is noticeably SOB on the phone  1. Are you currently SOB (can you hear that pt is SOB on the phone)? No   2. How long have you been experiencing SOB? Couple of weeks   3. Are you SOB when sitting or when up moving around? Both - intermitted times   4. Are you currently experiencing any other symptoms? Swelling in lower extremity.

## 2022-10-13 ENCOUNTER — Other Ambulatory Visit
Admission: RE | Admit: 2022-10-13 | Discharge: 2022-10-13 | Disposition: A | Discharge status: Home / Self Care | Payer: Medicare HMO | Source: Ambulatory Visit | Attending: Internal Medicine | Admitting: Internal Medicine

## 2022-10-13 DIAGNOSIS — R7301 Impaired fasting glucose: Secondary | ICD-10-CM | POA: Diagnosis present

## 2022-10-13 DIAGNOSIS — E782 Mixed hyperlipidemia: Secondary | ICD-10-CM | POA: Insufficient documentation

## 2022-10-13 DIAGNOSIS — M353 Polymyalgia rheumatica: Secondary | ICD-10-CM | POA: Diagnosis present

## 2022-10-13 LAB — LIPID PANEL
Cholesterol: 234 mg/dL — ABNORMAL HIGH (ref 0–200)
HDL: 48 mg/dL (ref >40)
LDL Cholesterol: 153 mg/dL — ABNORMAL HIGH (ref 0–99)
Total CHOL/HDL Ratio: 4.9 RATIO
Triglycerides: 165 mg/dL — ABNORMAL HIGH (ref <150)
VLDL: 33 mg/dL (ref 0–40)

## 2022-10-13 LAB — COMPREHENSIVE METABOLIC PANEL
ALT: 15 U/L (ref 0–44)
AST: 21 U/L (ref 15–41)
Albumin: 3.8 g/dL (ref 3.5–5.0)
Alkaline Phosphatase: 65 U/L (ref 38–126)
Anion gap: 8 (ref 5–15)
BUN: 14 mg/dL (ref 8–23)
CO2: 29 mmol/L (ref 22–32)
Calcium: 8.7 mg/dL — ABNORMAL LOW (ref 8.9–10.3)
Chloride: 101 mmol/L (ref 98–111)
Creatinine, Ser: 0.82 mg/dL (ref 0.44–1.00)
GFR, Estimated: >60 mL/min (ref >60)
Glucose, Bld: 109 mg/dL — ABNORMAL HIGH (ref 70–99)
Potassium: 3.8 mmol/L (ref 3.5–5.1)
Sodium: 138 mmol/L (ref 135–145)
Total Bilirubin: 0.9 mg/dL (ref 0.3–1.2)
Total Protein: 7.2 g/dL (ref 6.5–8.1)

## 2022-10-13 LAB — HEMOGLOBIN A1C
Hgb A1c MFr Bld: 6.2 % — ABNORMAL HIGH (ref 4.8–5.6)
Mean Plasma Glucose: 131.24 mg/dL

## 2022-10-13 LAB — LDL CHOLESTEROL, DIRECT: Direct LDL: 159 mg/dL — ABNORMAL HIGH (ref 0–99)

## 2022-10-13 LAB — SEDIMENTATION RATE: Sed Rate: 49 mm/hr — ABNORMAL HIGH (ref 0–30)

## 2022-10-13 LAB — CK: Total CK: 63 U/L (ref 38–234)

## 2022-10-13 NOTE — Addendum Note (Signed)
Addended by: Lonell Face C on: 10/13/2022 12:26 PM   Modules accepted: Orders

## 2022-10-13 NOTE — Addendum Note (Signed)
Addended by: Kista Robb C on: 10/13/2022 12:26 PM   Modules accepted: Orders  

## 2022-10-14 ENCOUNTER — Encounter: Payer: Self-pay | Admitting: Internal Medicine

## 2022-10-14 ENCOUNTER — Ambulatory Visit (INDEPENDENT_AMBULATORY_CARE_PROVIDER_SITE_OTHER): Payer: Medicare HMO | Admitting: Clinical

## 2022-10-14 DIAGNOSIS — F4322 Adjustment disorder with anxiety: Secondary | ICD-10-CM

## 2022-10-14 NOTE — Progress Notes (Signed)
Time: 12:01 pm- 12:58 pm CPT Code: [id number] Diagnosis: F43.22  Tracey Wiley was seen remotely using secure video conferencing. She was in her home and the therapist was in her office at the time of the appointment. She reflected upon developments in her ex-husband's health, and shared a traumatic event that makes mother's day a challenging time for her. Therapist encouraged her to use self-care and distraction over the weekend.  She is scheduled to be seen again in one week.  Treatment Plan Client Abilities/Strengths Tracey Wiley shared that she has participated in therapy in the past and has been very honest in session.  Client Treatment Preferences:  Tracey Wiley prefers in person appointments. She prefers Tuesday and Thursday afternoon appointments. Client Statement of Needs  Tracey Wiley is seeking validation of her emotional responses and overall self-concept. Treatment Level  Weekly   Symptoms Tearfulness, irritability Problems Addressed  Goals Tracey Wiley will reduce symptoms of depression and improve overall mood 1. Tracey Wiley will increase comfort in social situations Objective  Target Date: 01/08/2023 Frequency: Weekly  Progress: 0 Modality: Individual therapy  Objective  Target Date: 01/07/2023 Frequency: Weekly  Progress: 0 Modality: individual therapy  Related Interventions  Tracey Wiley will have opportunities to process her experiences in session  Therapist will point out maladaptive thought and behavior patterns using CBT strategies. Therapist will incorporate behavior activation as appropriate. Therapist will incorporate gradual exposure therapy as appropriate. Therapist will provide referrals for additional resource as appropriate.  Diagnosis Axis none Adjustment Disorder with Anxiety and Depression (F43.23)   Axis none    Conditions For Discharge Achievement of treatment goals and objectives                  [name], PhD               [name], PhDTime: 12:01 pm- 12:58  pm CPT Code: [id number] Diagnosis: F43.22  Tracey Wiley was seen in person for therapy. She reflected upon her progress since her dog passed away one year prior, and shared developments in her relationship. She expressed that she finds it difficult to have dreams for the future, and therapist suggested exploring this further. She is scheduled to be seen again in one week.  Treatment Plan Client Abilities/Strengths Tracey Wiley shared that she has participated in therapy in the past and has been very honest in session.  Client Treatment Preferences:  Tracey Wiley prefers in person appointments. She prefers Tuesday and Thursday afternoon appointments. Client Statement of Needs  Tracey Wiley is seeking validation of her emotional responses and overall self-concept. Treatment Level  Weekly   Symptoms Tearfulness, irritability Problems Addressed  Goals Tracey Wiley will reduce symptoms of depression and improve overall mood 1. Tracey Wiley will increase comfort in social situations Objective  Target Date: 01/08/2023 Frequency: Weekly  Progress: 0 Modality: Individual therapy  Objective  Target Date: 01/07/2023 Frequency: Weekly  Progress: 0 Modality: individual therapy  Related Interventions  Tracey Wiley will have opportunities to process her experiences in session  Therapist will point out maladaptive thought and behavior patterns using CBT strategies. Therapist will incorporate behavior activation as appropriate. Therapist will incorporate gradual exposure therapy as appropriate. Therapist will provide referrals for additional resource as appropriate.  Diagnosis Axis none Adjustment Disorder with Anxiety and Depression (F43.23)   Axis none    Conditions For Discharge Achievement of treatment goals and objectives    [name], PhD               [name], PhD

## 2022-10-19 ENCOUNTER — Ambulatory Visit: Payer: Medicare HMO | Admitting: Medical

## 2022-10-21 ENCOUNTER — Ambulatory Visit (INDEPENDENT_AMBULATORY_CARE_PROVIDER_SITE_OTHER): Payer: Medicare HMO | Admitting: Clinical

## 2022-10-21 DIAGNOSIS — F4322 Adjustment disorder with anxiety: Secondary | ICD-10-CM | POA: Diagnosis not present

## 2022-10-21 NOTE — Progress Notes (Signed)
Time: 12:01 pm- 12:58 pm CPT Code: [id number] Diagnosis: F43.22  Rua was seen remotely using secure video conferencing. She was in her home and the therapist was in her office at the time of the appointment. Session focused on hand inflammation that had caused significant pain and impaired her functioning since the Monday of that week. Therapist queried whether it may in part be a stress response to Mother's day, and she agreed that this was possible. Therapist engaged her in a diaphragmatic breathing exercise. Therapist also encouraged her to consider seeking medical care at an urgent care if symptoms don't improve before her scheduled doctor's visit for Monday. She is scheduled to be seen again in one week.  Treatment Plan Client Abilities/Strengths Willer shared that she has participated in therapy in the past and has been very honest in session.  Client Treatment Preferences:  Pegah prefers in person appointments. She prefers Tuesday and Thursday afternoon appointments. Client Statement of Needs  Noma is seeking validation of her emotional responses and overall self-concept. Treatment Level  Weekly   Symptoms Tearfulness, irritability Problems Addressed  Goals Kaislee will reduce symptoms of depression and improve overall mood 1. Tabia will increase comfort in social situations Objective  Target Date: 01/08/2023 Frequency: Weekly  Progress: 0 Modality: Individual therapy  Objective  Target Date: 01/07/2023 Frequency: Weekly  Progress: 0 Modality: individual therapy  Related Interventions  Hibo will have opportunities to process her experiences in session  Therapist will point out maladaptive thought and behavior patterns using CBT strategies. Therapist will incorporate behavior activation as appropriate. Therapist will incorporate gradual exposure therapy as appropriate. Therapist will provide referrals for additional resource as appropriate.  Diagnosis Axis none Adjustment Disorder with Anxiety  and Depression (F43.23)   Axis none    Conditions For Discharge Achievement of treatment goals and objectives                  [name], PhD               [name], PhDTime: 12:01 pm- 12:58 pm CPT Code: [id number] Diagnosis: F43.22  Persaeus was seen in person for therapy. She reflected upon her progress since her dog passed away one year prior, and shared developments in her relationship. She expressed that she finds it difficult to have dreams for the future, and therapist suggested exploring this further. She is scheduled to be seen again in one week.  Treatment Plan Client Abilities/Strengths Maybel shared that she has participated in therapy in the past and has been very honest in session.  Client Treatment Preferences:  Azaylah prefers in person appointments. She prefers Tuesday and Thursday afternoon appointments. Client Statement of Needs  Kemily is seeking validation of her emotional responses and overall self-concept. Treatment Level  Weekly   Symptoms Tearfulness, irritability Problems Addressed  Goals Wyomia will reduce symptoms of depression and improve overall mood 1. Rickisha will increase comfort in social situations Objective  Target Date: 01/08/2023 Frequency: Weekly  Progress: 0 Modality: Individual therapy  Objective  Target Date: 01/07/2023 Frequency: Weekly  Progress: 0 Modality: individual therapy  Related Interventions  Elayna will have opportunities to process her experiences in session  Therapist will point out maladaptive thought and behavior patterns using CBT strategies. Therapist will incorporate behavior activation as appropriate. Therapist will incorporate gradual exposure therapy as appropriate. Therapist will provide referrals for additional resource as appropriate.  Diagnosis Axis none Adjustment Disorder with Anxiety and Depression (F43.23)   Axis none  Conditions For Discharge Achievement of treatment goals and  objectives    [name], PhD               [name], PhD

## 2022-10-24 NOTE — Progress Notes (Signed)
Cardiology Office Note  Date:  10/26/2022   ID:  Tracey Wiley, DOB [birth year], MRN [id number]  PCP:  Sherlene Shams, MD   Chief Complaint  Patient presents with   Shortness of Breath    Patient c/o shortness of breath & swelling in ankles and feet. Medications reviewed by the patient verbally.     HPI:  Tracey Wiley is a very pleasant 67 year old woman with a  history of  PFO,  with  closure device in April 2008 performed in [place],  hyperlipidemia,  PAT versus paroxysmal atrial fibrillation on sotalol, hypertension,  Bell's palsy gout GERD Significant baseline stress, continues to care for her ex-husband who has underlying cognitive issues who presents today for follow-up of her arrhythmia  LOV 7/23 Reports having shortness of breath, leg swelling  Echo October 2021 EF 55%, grade 2 diastolic dysfunction, mildly dilated left atrium  Total chol 234, LDL 153  Followed by rheumatology RA, Polymyalgia rheumatica Off methotrexate  Wants to be on plaquenil, has to stop sotolol  Leg swelling b/l; trace 100 oz water a day Takes lasix 20 mg 4x a week  Dizzy when dancing or riding the exercise bike Monitor ordered by Dr. Darrick Huntsman  EKG personally reviewed by myself on todays visit Sinus bradycardia rate 52 bpm no significant ST-T wave changes History of chronic bradycardia  Statin myalgias Tried crestor: Reports that she felt "horrible" Did not try repatha, purchase some but left them at room temperature by accident   She had a event monitor: reaction to adhesive 33 Supraventricular Tachycardia runs occurred, the run with the fastest interval lasting 5 beats with a max rate of 226 bpm,  the longest lasting 21.1 secs with an avg rate of 120 bpm.  Isolated SVEs were rare (<1.0%), SVE Couplets were rare (<1.0%), and SVE Triplets were rare (<1.0%). Isolated VEs were rare (<1.0%, 217), VE Couplets were rare (<1.0%, 26), and VE Triplets were rare (<1.0%, 2). Most patient  triggered events (shortness of breath, dizziness/fluttering) were not associated with significant arrhythmia.    Labs reviewed Total chol 257, LDL 165 Off zocor, myalgias better,  Also had myalgias on Lipitor  Previously mentioned having a problem with her uterus, was scheduled for hysterectomy but this has been canceled in the setting of her inflammatory issue   lost her mother in February 2015.  History of gout in her wrists. Improvement with colchicine. Indomethacin upset her stomach  PMH:   has a past medical history of Adenomyosis, Anginal pain (HCC), Anxiety, Aortic atherosclerosis (HCC), Arthritis, Asthma, Cervicalgia, Chest pain, atypical, Complication of anesthesia, COVID (07/08/2022), Diastolic dysfunction, DVT (deep venous thrombosis) (HCC), Dyspnea, Esophageal stricture, Esophagitis, Facial paralysis/Bells palsy (10/29/2014), Family history of breast cancer, Family history of colon cancer, Family history of Lynch syndrome, Family history of stomach cancer, FHx: ovarian cancer, Fibroids, Fistula, labyrinthine (1994), Gastritis (11/30/2020), Gastroesophageal reflux disease, Generalized tonic-clonic seizure (HCC) (2002), 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 (2000), Genital herpes, Gout, Headache, History of 2019 novel coronavirus disease (COVID-19) (07/08/2022), History of hiatal hernia, History of pneumonia, Hyperlipidemia, Hypertension, Hypertrophy of breast, IBS (irritable bowel syndrome), Long-term corticosteroid use, Lumbago, Lumbar stenosis, Menopause, Meralgia paresthetica of right side, Obesity, Osteopenia, Ostium secundum type atrial septal defect, Panic attacks, PAT (paroxysmal atrial tachycardia), PFO (patent foramen ovale), Polymyalgia rheumatica (HCC), PONV (postoperative nausea and vomiting), Post-concussion vertigo (1994), Postconcussion syndrome, Pre-diabetes, PTSD (post-traumatic stress disorder), Sleep difficulties, Spinal stenosis, lumbar region, without neurogenic claudication,  Traumatic brain injury, closed (HCC) (1994), Vertigo, and Vitamin D  deficiency.  PSH:    Past Surgical History:  Procedure Laterality Date   BREAST BIOPSY Right 2009   lymphoid and fibroadipose tissue   COLONOSCOPY  08-2014   DILATION AND CURETTAGE OF UTERUS     ESOPHAGOGASTRODUODENOSCOPY     HYSTEROSCOPY WITH D & C  01/20/2015   Procedure: DILATATION AND CURETTAGE /HYSTEROSCOPY;  Surgeon: Herold Harms, MD;  Location: [facility];  Service: Gynecology;;   HYSTEROSCOPY WITH D & C N/A 08/06/2022   Procedure: FRACTIONAL DILATATION AND CURETTAGE Melton Krebs;  Surgeon: Christeen Douglas, MD;  Location: [facility];  Service: Gynecology;  Laterality: N/A;   INNER EAR SURGERY     x 4   LAPAROSCOPY  01/20/2015   Procedure: LAPAROSCOPY DIAGNOSTIC;  Surgeon: Prentice Docker Defrancesco, MD;  Location: [facility];  Service: Gynecology;;   NASAL SEPTUM SURGERY     PATENT FORAMEN OVALE CLOSURE  April 2008   TMJ x 2     uterine ablation  March 2008    Current Outpatient Medications  Medication Sig Dispense Refill   acetaminophen (TYLENOL) 500 MG tablet Take 500 mg by mouth daily as needed for pain.     albuterol (VENTOLIN HFA) 108 (90 Base) MCG/ACT inhaler Inhale 2 puffs into the lungs every 6 (six) hours as needed for wheezing or shortness of breath. 1 each 1   aspirin EC 81 MG tablet Take 81 mg by mouth daily.     Calcium-Magnesium-Vitamin D (CALCIUM 1200+D3 PO) Take 1 tablet by mouth daily.     cephALEXin (KEFLEX) 500 MG capsule Take 500 mg by mouth 3 (three) times daily.     colchicine 0.6 MG tablet Take 1 tablet (0.6 mg total) by mouth 2 (two) times daily as needed. 180 tablet 0   [employer]S HYDROCORTISONE ANTI-ITCH 0.5 % APPLY TO THE AFFECTED AREA TWICE A DAY 57 g 11   Echinacea 400 MG CAPS Take 1 capsule by mouth daily.     esomeprazole (NEXIUM) 40 MG capsule Take 1 capsule (40 mg total) by mouth daily. 90 capsule 3   ezetimibe (ZETIA) 10 MG tablet TAKE 1 TABLET BY MOUTH EVERY DAY 90 tablet 2   famotidine  (PEPCID) 20 MG tablet Take 1 tablet (20 mg total) by mouth at bedtime. 90 tablet 3   Flaxseed, Linseed, (FLAX SEED OIL) 1000 MG CAPS Take 1 capsule by mouth 2 (two) times daily.     folic acid (FOLVITE) 1 MG tablet Take 1 mg by mouth daily.     furosemide (LASIX) 20 MG tablet TAKE 1 TABLET BY MOUTH 4 DAYS PER WEEK AS DIRECTED 48 tablet 1   magnesium gluconate (MAGONATE) 500 MG tablet Take 500 mg by mouth at bedtime.     MELATONIN PO Take 8 mg by mouth at bedtime.     Omega-3 Fatty Acids (OMEGA-3 FISH OIL PO) Take 1 capsule by mouth daily.     ondansetron (ZOFRAN ODT) 4 MG disintegrating tablet Take 1 tablet (4 mg total) by mouth every 8 (eight) hours as needed for nausea or vomiting. 20 tablet 0   potassium chloride (KLOR-CON) 10 MEQ tablet TAKE 1 TABLET (10 MEQ TOTAL) BY MOUTH DAILY. TAKE 1 TABLET DAILY, 4 TIMES A WEEK. TAKE WITH LASIX (MAY TAKE EXTRA TAB WITH EXTRA LASIX) 90 tablet 3   predniSONE (DELTASONE) 5 MG tablet Take 5 mg by mouth daily with breakfast.     predniSONE (DELTASONE) 5 MG tablet 2 tablets daily for 3 days,  then 1 tablet daily for one  month 36 tablet 0   promethazine (PHENERGAN) 12.5 MG tablet Take 12.5 mg by mouth every 6 (six) hours as needed for nausea or vomiting.     sotalol (BETAPACE) 80 MG tablet Take 40 mg by mouth 4 (four) times daily.     triamterene-hydrochlorothiazide (DYAZIDE) 37.5-25 MG capsule TAKE 1 CAPSULE BY MOUTH EVERY DAY 90 capsule 2   valACYclovir (VALTREX) 1000 MG tablet TAKE 1 TABLET BY MOUTH DAILY FOR SUPPRESSION , OR 5 TIMES DAILY FOR FLARE (Patient taking differently: Take 1,000 mg by mouth daily as needed.) 35 tablet 2   vitamin C (ASCORBIC ACID) 500 MG tablet Take 500 mg by mouth daily.     zinc gluconate 50 MG tablet Take 50 mg by mouth daily.     No current facility-administered medications for this visit.     Allergies:   2,4-d dimethylamine; Codeine; Hydrocodone-acetaminophen; Morphine; Nitrofurantoin monohyd macro; Ciprofloxacin;  Erythromycin base; Hydrocodone; Oxycodone; Pamelor [nortriptyline hcl]; Stadol [butorphanol]; Talwin [pentazocine]; Topamax [topiramate]; Wellbutrin [bupropion]; Zanaflex [tizanidine hcl]; Lamictal [lamotrigine]; and Macrobid [nitrofurantoin macrocrystal]   Social History:  The patient  reports that she has never smoked. She has never used smokeless tobacco. She reports that she does not drink alcohol and does not use drugs.   Family History:   family history includes Breast cancer in her paternal aunt and paternal aunt; Cancer (age of onset: 25) in her mother; Colon cancer (age of onset: 65) in her sister; Heart failure in her father; Hyperlipidemia in her mother; Hypertension in her mother; Hypothyroidism in her mother; Kidney disease in her mother; Ovarian cancer in an other family member; Stomach cancer in her maternal grandmother.    Review of Systems: Review of Systems  Constitutional: Negative.   HENT: Negative.    Respiratory: Negative.    Cardiovascular:  Positive for chest pain.  Gastrointestinal: Negative.   Musculoskeletal: Negative.   Neurological: Negative.   Psychiatric/Behavioral: Negative.    All other systems reviewed and are negative.   PHYSICAL EXAM: VS:  BP 130/76 (BP Location: Left Arm, Patient Position: Sitting, Cuff Size: Normal)   Ht 5\' 5"  (1.651 m)   Wt 229 lb 2 oz (103.9 kg)   SpO2 98%   BMI 38.13 kg/m  , BMI Body mass index is 38.13 kg/m. Constitutional:  oriented to person, place, and time. No distress.  HENT:  Head: Grossly normal Eyes:  no discharge. No scleral icterus.  Neck: No JVD, no carotid bruits  Cardiovascular: Regular rate and rhythm, no murmurs appreciated Trace lower extremity edema Pulmonary/Chest: Clear to auscultation bilaterally, no wheezes or rails Abdominal: Soft.  no distension.  no tenderness.  Musculoskeletal: Normal range of motion Neurological:  normal muscle tone. Coordination normal. No atrophy Skin: Skin warm and  dry Psychiatric: normal affect, pleasant   Recent Labs: 08/03/2022: Hemoglobin 13.4; Platelets 293 10/13/2022: ALT 15; BUN 14; Creatinine, Ser 0.82; Potassium 3.8; Sodium 138    Lipid Panel Lab Results  Component Value Date   CHOL 234 (H) 10/13/2022   HDL 48 10/13/2022   LDLCALC 153 (H) 10/13/2022   TRIG 165 (H) 10/13/2022     Wt Readings from Last 3 Encounters:  10/26/22 229 lb 2 oz (103.9 kg)  10/25/22 231 lb 6.4 oz (105 kg)  08/26/22 231 lb (104.8 kg)     ASSESSMENT AND PLAN:  SVT/ PSVT/ PAT  on sotalol 80 mg twice daily No significant breakthrough arrhythmia Reports he may need to come off sotalol to start Plaquenil for rheumatologic issues Will  discuss with her again in August, does not feel this is a rash  Chest pains prior cardiac CTA no significant disease No further ischemic workup needed at this time  Mixed hyperlipidemia - Plan: EKG 12-Lead statin myalgia In follow-up we will discuss retrial of PCSK9 inhibitor versus leqvio  Polymyalgia rheumatica, RA Elevated sed rate and CRP, has been treated with methotrexate in the past, prednisone, followed by rheumatology  Obesity (BMI 30-39.9) - Plan: EKG 12-Lead Exercise as tolerated  Bilateral lower extremity edema - Plan: EKG 12-Lead Worsening leg edema recommended she increase Lasix up to 20 mg twice daily 4 days a week, continue HCTZ daily, increase potassium up to daily with 2 potassium on double Lasix days  Leg pain Stable  Prolonged QT interval QTc is stable Normal EKG   Total encounter time more than 30 minutes  Greater than 50% was spent in counseling and coordination of care with the patient    No orders of the defined types were placed in this encounter.    Signed, Dossie Arbour, M.D., Ph.D. 10/26/2022  [facility] [place], [place] [phone]

## 2022-10-25 ENCOUNTER — Ambulatory Visit (INDEPENDENT_AMBULATORY_CARE_PROVIDER_SITE_OTHER): Payer: Medicare HMO

## 2022-10-25 ENCOUNTER — Ambulatory Visit: Payer: Medicare HMO | Attending: Internal Medicine

## 2022-10-25 ENCOUNTER — Ambulatory Visit: Payer: Medicare HMO | Admitting: Internal Medicine

## 2022-10-25 ENCOUNTER — Encounter: Payer: Self-pay | Admitting: Internal Medicine

## 2022-10-25 VITALS — BP 138/70 | HR 59 | Temp 98.4°F | Ht 65.0 in | Wt 231.4 lb

## 2022-10-25 DIAGNOSIS — R0602 Shortness of breath: Secondary | ICD-10-CM

## 2022-10-25 DIAGNOSIS — R600 Localized edema: Secondary | ICD-10-CM

## 2022-10-25 DIAGNOSIS — M353 Polymyalgia rheumatica: Secondary | ICD-10-CM

## 2022-10-25 DIAGNOSIS — I4711 Inappropriate sinus tachycardia, so stated: Secondary | ICD-10-CM

## 2022-10-25 DIAGNOSIS — I471 Supraventricular tachycardia, unspecified: Secondary | ICD-10-CM

## 2022-10-25 DIAGNOSIS — M109 Gout, unspecified: Secondary | ICD-10-CM | POA: Diagnosis not present

## 2022-10-25 DIAGNOSIS — I6522 Occlusion and stenosis of left carotid artery: Secondary | ICD-10-CM

## 2022-10-25 DIAGNOSIS — R609 Edema, unspecified: Secondary | ICD-10-CM | POA: Diagnosis not present

## 2022-10-25 LAB — C-REACTIVE PROTEIN: CRP: <1 mg/dL (ref 0.5–20.0)

## 2022-10-25 LAB — MICROALBUMIN / CREATININE URINE RATIO
Creatinine,U: 32.4 mg/dL
Microalb Creat Ratio: 2.2 mg/g (ref 0.0–30.0)
Microalb, Ur: <0.7 mg/dL (ref 0.0–1.9)

## 2022-10-25 LAB — SEDIMENTATION RATE: Sed Rate: 17 mm/hr (ref 0–30)

## 2022-10-25 MED ORDER — AMOXICILLIN 500 MG PO TABS: 2000.0000 mg | ORAL_TABLET | Freq: Once | ORAL | 0 refills | Status: DC

## 2022-10-25 MED ORDER — PREDNISONE 5 MG PO TABS: ORAL_TABLET | ORAL | 0 refills | Status: DC

## 2022-10-25 MED ORDER — COLCHICINE 0.6 MG PO TABS: 0.6000 mg | ORAL_TABLET | Freq: Two times a day (BID) | ORAL | 0 refills | Status: DC | PRN

## 2022-10-25 NOTE — Assessment & Plan Note (Addendum)
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  Currently on 30 mg prednisone (started by patient at 40 mg daily last week for "gout flare")  checking ESR/CRP today.  Will reduce dose down tby 10 mg every 3 days to  5 mg daily over the next several days

## 2022-10-25 NOTE — Patient Instructions (Addendum)
20 mg one more  day, then: 10 mg  daily x 3 days  Continue 5 mg daily after that  for one month    Chest x ray , labs and urine collection are under way for evaluation  of the edema  ZIO monitor ordered to capture your heart rhythms over the next 2 weeks

## 2022-10-25 NOTE — Assessment & Plan Note (Signed)
Checking for hypercortisol., nephrotic synrome,  MM and CHF

## 2022-10-25 NOTE — Progress Notes (Signed)
Subjective:  Patient ID: Tracey Wiley, female    DOB: 08/25/1955  Age: 67 y.o. MRN: [id number]  CC: The primary encounter diagnosis was Gouty arthritis. Diagnoses of Shortness of breath, Edema, unspecified type, Inappropriate sinus tachycardia, Bilateral lower extremity edema, Polymyalgia rheumatica (HCC), SVT/ PSVT/ PAT, and Carotid atherosclerosis, left were also pertinent to this visit.   HPI Tracey Wiley presents for  Chief Complaint  Patient presents with   Medical Management of Chronic Issues   1) new onset bilateral edema started several weeks ago .  Sees [facility] in the am . Has occurred in the past,   Using compression stockings which result in fluid accumulating  above the stocking.  When the legs are swollen they are tender but does not feel like gout.   Not resolving completely by morning  as in the past.  Reports shortness of breath   if she rolls onto right side.  .  Taking furosemide every other day  and maxzide daily . oes not add salt to food or eat salty snacks.   2) SVT :   Riding bicycle 2 miles daily or dances for 30  minutes  daily.  Pulse increases to 110 and is accompanied by a brief episode of vertigo /dizziness for a few second  which resolves  in spite of her gong on with exercise .  No recent ZIO monitor.    3) Inflammatory arthritis:  her rheumatologist Dr  Anne Hahn dropped her prednisone by  1 mg per month starting in  March,  she was taking 3 mg in May and MD  ordered x rays of hands. . Last Monday night her right middle finger became painful and swollen  in a manner  c/w gout so she started taking colchicine , but continued use was  limited by development of  profuse diarrhea  which rendered her housebound .  She   Increased her prednsone to 40 mg for 3 days ([facility] [employer] [facility] ) and by Friday f well enough to go to  [facility].  Hand x ray was done and told she had an infection from a prior cut.  Her knuckle swelling has  resolved with course of  keflex .  Taking 30 mg prednisone  x 2 days    Had a second opinion from rheum at [facility] recently (11 month wait)  by Dr Steva Colder. Who has diagnosed palindromic rheumatoid arthritis   and wanted her to  talk to cardiology about stopping  betapace and start ing placquenil.  Advised her to stop using NSAIDs. She recommended that she continue follow up  with Ils[facility] Iha , get the hand ultrasound,  and follow up with [facility] in [place]     Outpatient Medications Prior to Visit  Medication Sig Dispense Refill   acetaminophen (TYLENOL) 500 MG tablet Take 500 mg by mouth daily as needed for pain.     albuterol (VENTOLIN HFA) 108 (90 Base) MCG/ACT inhaler Inhale 2 puffs into the lungs every 6 (six) hours as needed for wheezing or shortness of breath. 1 each 1   aspirin EC 81 MG tablet Take 81 mg by mouth daily.     Calcium-Magnesium-Vitamin D (CALCIUM 1200+D3 PO) Take 1 tablet by mouth daily.     cephALEXin (KEFLEX) 500 MG capsule Take 500 mg by mouth 3 (three) times daily.     CVS HYDROCORTISONE ANTI-ITCH 0.5 % APPLY TO THE AFFECTED AREA TWICE A DAY 57 g 11   Echinacea 400 MG CAPS  Take 1 capsule by mouth daily.     esomeprazole (NEXIUM) 40 MG capsule Take 1 capsule (40 mg total) by mouth daily. 90 capsule 3   ezetimibe (ZETIA) 10 MG tablet TAKE 1 TABLET BY MOUTH EVERY DAY 90 tablet 2   famotidine (PEPCID) 20 MG tablet Take 1 tablet (20 mg total) by mouth at bedtime. 90 tablet 3   Flaxseed, Linseed, (FLAX SEED OIL) 1000 MG CAPS Take 1 capsule by mouth 2 (two) times daily.     folic acid (FOLVITE) 1 MG tablet Take 1 mg by mouth daily.     furosemide (LASIX) 20 MG tablet TAKE 1 TABLET BY MOUTH 4 DAYS PER WEEK AS DIRECTED (Patient taking differently: Take 20 mg by mouth every other day.) 48 tablet 1   magnesium gluconate (MAGONATE) 500 MG tablet Take 500 mg by mouth at bedtime.     MELATONIN PO Take 8 mg by mouth at bedtime.     Omega-3 Fatty Acids (OMEGA-3 FISH OIL PO) Take 1 capsule by mouth daily.     ondansetron (ZOFRAN ODT) 4 MG disintegrating  tablet Take 1 tablet (4 mg total) by mouth every 8 (eight) hours as needed for nausea or vomiting. 20 tablet 0   potassium chloride (KLOR-CON) 10 MEQ tablet TAKE 1 TABLET (10 MEQ TOTAL) BY MOUTH DAILY. TAKE 1 TABLET DAILY, 4 TIMES A WEEK. TAKE WITH LASIX (MAY TAKE EXTRA TAB WITH EXTRA LASIX) (Patient taking differently: Take 10 mEq by mouth every other day.  Take with Lasix) 90 tablet 3   predniSONE (DELTASONE) 5 MG tablet Take 5 mg by mouth daily with breakfast.     promethazine (PHENERGAN) 12.5 MG tablet Take 12.5 mg by mouth every 6 (six) hours as needed for nausea or vomiting.     sotalol (BETAPACE) 80 MG tablet TAKE 1 TABLET BY MOUTH TWICE A DAY (Patient taking differently: Take 40 mg by mouth in the morning, at noon, in the evening, and at bedtime.) 180 tablet 2   triamterene-hydrochlorothiazide (DYAZIDE) 37.5-25 MG capsule TAKE 1 CAPSULE BY MOUTH EVERY DAY 90 capsule 2   valACYclovir (VALTREX) 1000 MG tablet TAKE 1 TABLET BY MOUTH DAILY FOR SUPPRESSION , OR 5 TIMES DAILY FOR FLARE (Patient taking differently: Take 1,000 mg by mouth daily as needed.) 35 tablet 2   vitamin C (ASCORBIC ACID) 500 MG tablet Take 500 mg by mouth daily.     zinc gluconate 50 MG tablet Take 50 mg by mouth daily.     amoxicillin (AMOXIL) 500 MG tablet Take 2,000 mg by mouth once. Take 4 tabs 1 hour prior to dental procedures     colchicine 0.6 MG tablet Take 1 tablet (0.6 mg total) by mouth 2 (two) times daily as needed. 180 tablet 0   No facility-administered medications prior to visit.    Review of Systems;  Patient denies headache, fevers, malaise, unintentional weight loss, skin rash, eye pain, sinus congestion and sinus pain, sore throat, dysphagia,  hemoptysis , cough, dyspnea, wheezing, chest pain, palpitations, orthopnea, edema, abdominal pain, nausea, melena, diarrhea, constipation, flank pain, dysuria, hematuria, urinary  Frequency, nocturia, numbness, tingling, seizures,  Focal weakness, Loss of  consciousness,  Tremor, insomnia, depression, anxiety, and suicidal ideation.      Objective:  BP 138/70   Pulse (!) 59   Temp 98.4 F (36.9 C) (Oral)   Ht 5\' 5"  (1.651 m)   Wt 231 lb 6.4 oz (105 kg)   SpO2 98%   BMI 38.51 kg/m  BP Readings from Last 3 Encounters:  10/25/22 138/70  08/26/22 130/78  08/06/22 (!) 141/73    Wt Readings from Last 3 Encounters:  10/25/22 231 lb 6.4 oz (105 kg)  08/26/22 231 lb (104.8 kg)  08/06/22 229 lb 4.5 oz (104 kg)    Physical Exam Vitals reviewed.  Constitutional:      General: She is not in acute distress.    Appearance: Normal appearance. She is normal weight. She is not ill-appearing, toxic-appearing or diaphoretic.  HENT:     Head: Normocephalic.  Eyes:     General: No scleral icterus.       Right eye: No discharge.        Left eye: No discharge.     Conjunctiva/sclera: Conjunctivae normal.  Cardiovascular:     Rate and Rhythm: Normal rate and regular rhythm.     Pulses: Normal pulses.     Heart sounds: Normal heart sounds.  Pulmonary:     Effort: Pulmonary effort is normal. No respiratory distress.     Breath sounds: Normal breath sounds.  Musculoskeletal:        General: Normal range of motion.     Cervical back: Normal range of motion.     Right lower leg: Edema present.     Left lower leg: Edema present.  Skin:    General: Skin is warm and dry.  Neurological:     General: No focal deficit present.     Mental Status: She is alert and oriented to person, place, and time. Mental status is at baseline.  Psychiatric:        Mood and Affect: Mood normal.        Behavior: Behavior normal.        Thought Content: Thought content normal.        Judgment: Judgment normal.     Lab Results  Component Value Date   HGBA1C 6.2 (H) 10/13/2022   HGBA1C 5.8 (H) 12/22/2021   HGBA1C 6.0 (H) 08/18/2020    Lab Results  Component Value Date   CREATININE 0.82 10/13/2022   CREATININE 0.96 03/12/2022   CREATININE 0.71  12/04/2021    Lab Results  Component Value Date   WBC 13.0 (H) 08/03/2022   HGB 13.4 08/03/2022   HCT 40.9 08/03/2022   PLT 293 08/03/2022   GLUCOSE 109 (H) 10/13/2022   CHOL 234 (H) 10/13/2022   TRIG 165 (H) 10/13/2022   HDL 48 10/13/2022   LDLDIRECT 159 (H) 10/13/2022   LDLCALC 153 (H) 10/13/2022   ALT 15 10/13/2022   AST 21 10/13/2022   NA 138 10/13/2022   K 3.8 10/13/2022   CL 101 10/13/2022   CREATININE 0.82 10/13/2022   BUN 14 10/13/2022   CO2 29 10/13/2022   TSH 2.745 03/20/2020   HGBA1C 6.2 (H) 10/13/2022    No results found.  Assessment & Plan:  .Gouty arthritis -     Sedimentation rate -     C-reactive protein  Shortness of breath -     DG Chest 2 View; Future -     Brain natriuretic peptide  Edema, unspecified type Assessment & Plan: Checking for hypercortisol., nephrotic synrome,  MM and CHF   Orders: -     Protein electrophoresis, serum -     IFE AND PE, RANDOM URINE -     Microalbumin / creatinine urine ratio -     Cortisol, Free and Cortisone, 24 hour urine with Creatinine; Future  Inappropriate sinus tachycardia -  LONG TERM MONITOR (3-14 DAYS); Future  Bilateral lower extremity edema Assessment & Plan: Recurrent.  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  Currently on 30 mg prednisone (started by patient at 40 mg daily last week for "gout flare")  checking ESR/CRP today.  Will reduce dose down tby 10 mg every 3 days to  5 mg daily over the next several days    SVT/ PSVT/ PAT Assessment & Plan: ZIO ordered    Carotid atherosclerosis, left Assessment & Plan: Elevated since suspending zetia .  She is taking   Lab Results  Component Value Date   CHOL 234 (H) 10/13/2022   HDL 48 10/13/2022   LDLCALC 153 (H) 10/13/2022   LDLDIRECT 159 (H) 10/13/2022   TRIG 165 (H) 10/13/2022   CHOLHDL 4.9 10/13/2022      Other  orders -     Colchicine; Take 1 tablet (0.6 mg total) by mouth 2 (two) times daily as needed.  Dispense: 180 tablet; Refill: 0 -     Amoxicillin; Take 4 tablets (2,000 mg total) by mouth once for 1 dose. Take 4 tabs 1 hour prior to dental procedures  Dispense: 4 tablet; Refill: 0 -     predniSONE; 2 tablets daily for 3 days,  then 1 tablet daily for one month  Dispense: 36 tablet; Refill: 0     I provided 47 minutes of face-to-face time during this encounter reviewing patient's last visit with me, patient's  most recent visit with cardiology,  rheumatology. recent surgical and non surgical procedures, previous  labs and imaging studies, counseling on currently addressed issues,  and post visit ordering to diagnostics and therapeutics .   Follow-up: Return in about 4 weeks (around 11/22/2022).   Sherlene Shams, MD

## 2022-10-25 NOTE — Assessment & Plan Note (Signed)
Elevated since suspending zetia .  She is taking   Lab Results  Component Value Date   CHOL 234 (H) 10/13/2022   HDL 48 10/13/2022   LDLCALC 153 (H) 10/13/2022   LDLDIRECT 159 (H) 10/13/2022   TRIG 165 (H) 10/13/2022   CHOLHDL 4.9 10/13/2022

## 2022-10-25 NOTE — Assessment & Plan Note (Signed)
Recurrent.  Checking for  nephrotic syndrome,  chf,  hypercortisol state

## 2022-10-25 NOTE — Assessment & Plan Note (Signed)
ZIO ordered

## 2022-10-26 ENCOUNTER — Ambulatory Visit: Payer: Medicare HMO | Attending: Medical | Admitting: Cardiovascular Disease

## 2022-10-26 ENCOUNTER — Encounter: Payer: Self-pay | Admitting: Cardiovascular Disease

## 2022-10-26 VITALS — BP 130/76 | HR 52 | Ht 65.0 in | Wt 229.1 lb

## 2022-10-26 DIAGNOSIS — R7303 Prediabetes: Secondary | ICD-10-CM | POA: Diagnosis not present

## 2022-10-26 DIAGNOSIS — M1990 Unspecified osteoarthritis, unspecified site: Secondary | ICD-10-CM

## 2022-10-26 DIAGNOSIS — I471 Supraventricular tachycardia, unspecified: Secondary | ICD-10-CM | POA: Diagnosis not present

## 2022-10-26 DIAGNOSIS — T466X5D Adverse effect of antihyperlipidemic and antiarteriosclerotic drugs, subsequent encounter: Secondary | ICD-10-CM

## 2022-10-26 DIAGNOSIS — I5189 Other ill-defined heart diseases: Secondary | ICD-10-CM

## 2022-10-26 DIAGNOSIS — E785 Hyperlipidemia, unspecified: Secondary | ICD-10-CM

## 2022-10-26 DIAGNOSIS — M353 Polymyalgia rheumatica: Secondary | ICD-10-CM

## 2022-10-26 DIAGNOSIS — M791 Myalgia, unspecified site: Secondary | ICD-10-CM

## 2022-10-26 DIAGNOSIS — T466X5A Adverse effect of antihyperlipidemic and antiarteriosclerotic drugs, initial encounter: Secondary | ICD-10-CM

## 2022-10-26 LAB — BRAIN NATRIURETIC PEPTIDE: Pro B Natriuretic peptide (BNP): 111 pg/mL — ABNORMAL HIGH (ref 0.0–100.0)

## 2022-10-26 MED ORDER — FUROSEMIDE 20 MG PO TABS: 20.0000 mg | ORAL_TABLET | Freq: Every day | ORAL | 3 refills | Status: DC | PRN

## 2022-10-26 MED ORDER — POTASSIUM CHLORIDE ER 10 MEQ PO TBCR: 10.0000 meq | EXTENDED_RELEASE_TABLET | Freq: Every day | ORAL | 3 refills | Status: DC | PRN

## 2022-10-26 NOTE — Patient Instructions (Addendum)
Medication Instructions:  Please increase lasix 20 mg twice a day on four days a week Take potassium daily,  Take two potassium when taking lasix twice a day  If you need a refill on your cardiac medications before your next appointment, please call your pharmacy.   Lab work: No new labs needed  Testing/Procedures: No new testing needed  Follow-Up: At [facility], you and your health needs are our priority.  As part of our continuing mission to provide you with exceptional heart care, we have created designated Provider Care Teams.  These Care Teams include your primary Cardiologist (physician) and Advanced Practice Providers (APPs -  Physician Assistants and Nurse Practitioners) who all work together to provide you with the care you need, when you need it.  You will need a follow up appointment in August 01/18/23 at 2:00 pm  Providers on your designated Care Team:   Nicolasa Ducking, NP Eula Listen, PA-C Cadence Fransico Michael, [place]  COVID-19 Vaccine Information can be found at: [email or web address] For questions related to vaccine distribution or appointments, please email [email or web address] or call [phone].

## 2022-10-28 ENCOUNTER — Ambulatory Visit (INDEPENDENT_AMBULATORY_CARE_PROVIDER_SITE_OTHER): Payer: Medicare HMO | Admitting: Clinical

## 2022-10-28 DIAGNOSIS — F4322 Adjustment disorder with anxiety: Secondary | ICD-10-CM

## 2022-10-28 LAB — PROTEIN ELECTROPHORESIS, SERUM
Albumin ELP: 4.2 g/dL (ref 3.8–4.8)
Alpha 1: 0.3 g/dL (ref 0.2–0.3)
Alpha 2: 0.8 g/dL (ref 0.5–0.9)
Beta 2: 0.5 g/dL (ref 0.2–0.5)
Beta Globulin: 0.5 g/dL (ref 0.4–0.6)
Gamma Globulin: 1 g/dL (ref 0.8–1.7)
Total Protein: 7.3 g/dL (ref 6.1–8.1)

## 2022-10-28 NOTE — Progress Notes (Signed)
Time: 12:01 pm- 12:58 pm CPT Code: 47829F Diagnosis: F43.22  Tracey Wiley was seen remotely using secure video conferencing. She was in her home and the therapist was in her office at the time of the appointment. She reported a significant reduction in stress after having resolved the issue with her hand from last week. The swelling was diagnosed as cellulitis and effectively treated with antibiotics. Session focused on processing dynamics in her friendships. She is scheduled to be seen again in one week.  Treatment Plan Client Abilities/Strengths Tracey Wiley shared that she has participated in therapy in the past and has been very honest in session.  Client Treatment Preferences:  Tracey Wiley prefers in person appointments. She prefers Tuesday and Thursday afternoon appointments. Client Statement of Needs  Tracey Wiley is seeking validation of her emotional responses and overall self-concept. Treatment Level  Weekly   Symptoms Tearfulness, irritability Problems Addressed  Goals Tracey Wiley will reduce symptoms of depression and improve overall mood 1. Tracey Wiley will increase comfort in social situations Objective  Target Date: 01/08/2023 Frequency: Weekly  Progress: 0 Modality: Individual therapy  Objective  Target Date: 01/07/2023 Frequency: Weekly  Progress: 0 Modality: individual therapy  Related Interventions  Tracey Wiley will have opportunities to process her experiences in session  Therapist will point out maladaptive thought and behavior patterns using CBT strategies. Therapist will incorporate behavior activation as appropriate. Therapist will incorporate gradual exposure therapy as appropriate. Therapist will provide referrals for additional resource as appropriate.  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 She reflected upon her progress since her dog passed away one year prior, and shared developments in her relationship. She expressed that she finds it difficult to have dreams for the future, and therapist suggested exploring this further. She is scheduled to be seen again in one week.  Treatment Plan Client Abilities/Strengths Tracey Wiley shared that she has participated in therapy in the past and has been very honest in session.  Client Treatment Preferences:  Tracey Wiley prefers in person appointments. She prefers Tuesday and Thursday afternoon appointments. Client Statement of Needs  Tracey Wiley is seeking validation of her emotional responses and overall self-concept. Treatment Level  Weekly   Symptoms Tearfulness, irritability Problems Addressed  Goals Tracey Wiley will reduce symptoms of depression and improve overall mood 1. Tracey Wiley will increase comfort in social situations Objective  Target Date: 01/08/2023 Frequency: Weekly  Progress: 0 Modality: Individual therapy  Objective  Target Date: 01/07/2023 Frequency: Weekly  Progress: 0 Modality: individual therapy  Related Interventions  Tracey Wiley will have opportunities to process her experiences in session  Therapist will point out maladaptive thought and behavior patterns using CBT strategies. Therapist will incorporate behavior activation as appropriate. Therapist will incorporate gradual exposure therapy as appropriate. Therapist will provide referrals for additional resource as appropriate.  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

## 2022-11-02 ENCOUNTER — Other Ambulatory Visit: Payer: Self-pay | Admitting: Cardiovascular Disease

## 2022-11-02 DIAGNOSIS — E785 Hyperlipidemia, unspecified: Secondary | ICD-10-CM

## 2022-11-02 LAB — IFE AND PE, RANDOM URINE: Protein, Ur: 4.3 mg/dL

## 2022-11-04 ENCOUNTER — Ambulatory Visit (INDEPENDENT_AMBULATORY_CARE_PROVIDER_SITE_OTHER): Payer: Medicare HMO | Admitting: Clinical

## 2022-11-04 DIAGNOSIS — F4322 Adjustment disorder with anxiety: Secondary | ICD-10-CM

## 2022-11-04 NOTE — Progress Notes (Signed)
Time: 12:01 pm- 12:58 pm CPT Code: 16109U Diagnosis: F43.22  Gayla was seen remotely using secure video conferencing. She was in her home and the therapist was in her office at the time of the appointment. She reported ongoing health challenges, including having awoken with extremely swollen feet the day after her most recent appointment. As a result, much of her week had been consumed by doctor's appointments to try to understand underlying causes. Session focused on processing this experience, as well as the stress it had caused. She indicated a plan to relax over the weekend by doing laundry and taking downtime at home. She is scheduled to be seen again in one week.  Treatment Plan Client Abilities/Strengths Norinne shared that she has participated in therapy in the past and has been very honest in session.  Client Treatment Preferences:  Nneka prefers in person appointments. She prefers Tuesday and Thursday afternoon appointments. Client Statement of Needs  Bari is seeking validation of her emotional responses and overall self-concept. Treatment Level  Weekly   Symptoms Tearfulness, irritability Problems Addressed  Goals Mireya will reduce symptoms of depression and improve overall mood 1. Eira will increase comfort in social situations Objective  Target Date: 01/08/2023 Frequency: Weekly  Progress: 0 Modality: Individual therapy  Objective  Target Date: 01/07/2023 Frequency: Weekly  Progress: 0 Modality: individual therapy  Related Interventions  Jodene will have opportunities to process her experiences in session  Therapist will point out maladaptive thought and behavior patterns using CBT strategies. Therapist will incorporate behavior activation as appropriate. Therapist will incorporate gradual exposure therapy as appropriate. Therapist will provide referrals for additional resource as appropriate.  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 She reflected upon her progress since her dog passed away one year prior, and shared developments in her relationship. She expressed that she finds it difficult to have dreams for the future, and therapist suggested exploring this further. She is scheduled to be seen again in one week.  Treatment Plan Client Abilities/Strengths Meraiah shared that she has participated in therapy in the past and has been very honest in session.  Client Treatment Preferences:  Lakeishia prefers in person appointments. She prefers Tuesday and Thursday afternoon appointments. Client Statement of Needs  Jarrica is seeking validation of her emotional responses and overall self-concept. Treatment Level  Weekly   Symptoms Tearfulness, irritability Problems Addressed  Goals Paizleigh will reduce symptoms of depression and improve overall mood 1. Laylaa will increase comfort in social situations Objective  Target Date: 01/08/2023 Frequency: Weekly  Progress: 0 Modality: Individual therapy  Objective  Target Date: 01/07/2023 Frequency: Weekly  Progress: 0 Modality: individual therapy  Related Interventions  Chalon will have opportunities to process her experiences in session  Therapist will point out maladaptive thought and behavior patterns using CBT strategies. Therapist will incorporate behavior activation as appropriate. Therapist will incorporate gradual exposure therapy as appropriate. Therapist will provide referrals for additional resource as appropriate.  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

## 2022-11-09 ENCOUNTER — Ambulatory Visit: Payer: Medicare HMO | Admitting: Internal Medicine

## 2022-11-09 ENCOUNTER — Encounter: Payer: Self-pay | Admitting: Internal Medicine

## 2022-11-09 VITALS — BP 132/66 | HR 74 | Temp 98.3°F | Ht 65.0 in | Wt 230.4 lb

## 2022-11-09 DIAGNOSIS — R7303 Prediabetes: Secondary | ICD-10-CM | POA: Diagnosis not present

## 2022-11-09 DIAGNOSIS — R600 Localized edema: Secondary | ICD-10-CM | POA: Diagnosis not present

## 2022-11-09 DIAGNOSIS — M1990 Unspecified osteoarthritis, unspecified site: Secondary | ICD-10-CM

## 2022-11-09 DIAGNOSIS — I471 Supraventricular tachycardia, unspecified: Secondary | ICD-10-CM

## 2022-11-09 NOTE — Patient Instructions (Addendum)
I agree with reducing  your prednisone to 2 mg daily as planned by Ilsa Iha   For your skin issues.,  call [facility] and Ask to see Willeen Niece , MD   Will make Referral to Dr Adolphus Birchwood and Roseanne Kaufman at [facility] if she can't see you    The 24 hour urine collection will not be accurate if the urine only represents 8 hours of urine output.  I will let you know when to recollect

## 2022-11-09 NOTE — Progress Notes (Incomplete)
Subjective:  Patient ID: Tracey Wiley, female    DOB: 1956-02-28  Age: 67 y.o. MRN: [id number]  CC: There were no encounter diagnoses.   HPI Tracey Wiley presents for  Chief Complaint  Patient presents with  . Medical Management of Chronic Issues    Discuss medication    1) inflammatory arthritis/PMR:  ESR and CRP were normal on May 20  currently taking prednisone 5 mg dose. For the past 8 days.  Has an ultrasound of hands at [facility] ordered in March by Ilsa Iha for July  She was supposed to have lowered her dose to 2 mg by  this point.    Waiting to start placquenil but needs to stop sotalol.  She discussed with Gollan.  No decision made until follow up in August .  She is feeling better than 2 weeks ago   2) Edema:  improved.  She was advised by Dr Lewie Loron on may 21 to increase lasix  to 20 mg twice a day on four days a week,  20 mg all other others Take potassium daily, increase to two potassium when taking lasix twice a day.  .  She has been taking maxzide and lasix  together once daily for the past 2 weeks and has noted resolution of edema  after 3 days of this regimen . And taking one potassium daily.  The 24 hour urine collection is still pending,  but she "filled the jug in 8 hours"      3) SVT:    Dr Mariah Milling advised her to postpone the initiation of the  ZIO monitor until her swelling resolved. 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  She wants to wear it when she is riding her bike and has not been able to ride lately due to humidity .  She has decided to wait until August.   4) Cellulitis  of   right hand:  resolved.  HX:  she scratched her middle knuckle on a bookcase,  did not cover it and it developed redness and swelling.  Was treated with keflex on May 17,  she  took 17 out of 30  pills because  on Day 6 she developed profuse diarrhea,  despite taking Librarian, academic daily .  Diarrhea resolved after 18  hours.    5) Skin: enlarging tender cyst on right upper lip ,  lesion on left forearm has been present and flast for years,  now for the last 3 weeks it has bcome crusting and scaling /scabbing over   Outpatient Medications Prior to Visit  Medication Sig Dispense Refill  . acetaminophen (TYLENOL) 500 MG tablet Take 500 mg by mouth daily as needed for pain.    Marland Kitchen albuterol (VENTOLIN HFA) 108 (90 Base) MCG/ACT inhaler Inhale 2 puffs into the lungs every 6 (six) hours as needed for wheezing or shortness of breath. 1 each 1  . aspirin EC 81 MG tablet Take 81 mg by mouth daily.    . Calcium-Magnesium-Vitamin D (CALCIUM 1200+D3 PO) Take 1 tablet by mouth daily.    . colchicine 0.6 MG tablet Take 1 tablet (0.6 mg total) by mouth 2 (two) times daily as needed. 180 tablet 0  . [employer] HYDROCORTISONE ANTI-ITCH 0.5 % APPLY TO THE AFFECTED AREA TWICE A DAY 57 g 11  . Echinacea 400 MG CAPS Take 1 capsule by mouth daily.    Marland Kitchen  esomeprazole (NEXIUM) 40 MG capsule Take 1 capsule (40 mg total) by mouth daily. 90 capsule 3  . ezetimibe (ZETIA) 10 MG tablet TAKE 1 TABLET BY MOUTH EVERY DAY 90 tablet 2  . famotidine (PEPCID) 20 MG tablet Take 1 tablet (20 mg total) by mouth at bedtime. 90 tablet 3  . Flaxseed, Linseed, (FLAX SEED OIL) 1000 MG CAPS Take 1 capsule by mouth 2 (two) times daily.    . folic acid (FOLVITE) 1 MG tablet Take 1 mg by mouth daily.    . furosemide (LASIX) 20 MG tablet Take 1 tablet (20 mg total) by mouth daily as needed. (Patient taking differently: Take 20 mg by mouth daily.) 90 tablet 3  . magnesium gluconate (MAGONATE) 500 MG tablet Take 500 mg by mouth at bedtime.    Marland Kitchen MELATONIN PO Take 8 mg by mouth at bedtime.    . Omega-3 Fatty Acids (OMEGA-3 FISH OIL PO) Take 1 capsule by mouth daily.    . ondansetron (ZOFRAN ODT) 4 MG disintegrating tablet Take 1 tablet (4 mg total) by mouth every 8 (eight) hours as needed for nausea or vomiting. 20 tablet 0  . potassium chloride (KLOR-CON) 10 MEQ tablet  Take 1 tablet (10 mEq total) by mouth daily as needed. (Patient taking differently: Take 10 mEq by mouth daily.) 90 tablet 3  . predniSONE (DELTASONE) 5 MG tablet Take 5 mg by mouth daily with breakfast.    . promethazine (PHENERGAN) 12.5 MG tablet Take 12.5 mg by mouth every 6 (six) hours as needed for nausea or vomiting.    . sotalol (BETAPACE) 80 MG tablet TAKE 1 TABLET BY MOUTH TWICE A DAY 180 tablet 3  . triamterene-hydrochlorothiazide (DYAZIDE) 37.5-25 MG capsule TAKE 1 CAPSULE BY MOUTH EVERY DAY 90 capsule 2  . valACYclovir (VALTREX) 1000 MG tablet TAKE 1 TABLET BY MOUTH DAILY FOR SUPPRESSION , OR 5 TIMES DAILY FOR FLARE (Patient taking differently: Take 1,000 mg by mouth daily as needed.) 35 tablet 2  . vitamin C (ASCORBIC ACID) 500 MG tablet Take 500 mg by mouth daily.    Marland Kitchen zinc gluconate 50 MG tablet Take 50 mg by mouth daily.    . predniSONE (DELTASONE) 5 MG tablet 2 tablets daily for 3 days,  then 1 tablet daily for one month (Patient not taking: Reported on 11/09/2022) 36 tablet 0   No facility-administered medications prior to visit.    Review of Systems;  Patient denies headache, fevers, malaise, unintentional weight loss, skin rash, eye pain, sinus congestion and sinus pain, sore throat, dysphagia,  hemoptysis , cough, dyspnea, wheezing, chest pain, palpitations, orthopnea, edema, abdominal pain, nausea, melena, diarrhea, constipation, flank pain, dysuria, hematuria, urinary  Frequency, nocturia, numbness, tingling, seizures,  Focal weakness, Loss of consciousness,  Tremor, insomnia, depression, anxiety, and suicidal ideation.      Objective:  BP 132/66   Pulse 74   Temp 98.3 F (36.8 C) (Oral)   Ht 5\' 5"  (1.651 m)   Wt 230 lb 6.4 oz (104.5 kg)   SpO2 96%   BMI 38.34 kg/m   BP Readings from Last 3 Encounters:  11/09/22 132/66  10/26/22 130/76  10/25/22 138/70    Wt Readings from Last 3 Encounters:  11/09/22 230 lb 6.4 oz (104.5 kg)  10/26/22 229 lb 2 oz (103.9 kg)   10/25/22 231 lb 6.4 oz (105 kg)    Physical Exam  Lab Results  Component Value Date   HGBA1C 6.2 (H) 10/13/2022   HGBA1C 5.8 (  H) 12/22/2021   HGBA1C 6.0 (H) 08/18/2020    Lab Results  Component Value Date   CREATININE 0.82 10/13/2022   CREATININE 0.96 03/12/2022   CREATININE 0.71 12/04/2021    Lab Results  Component Value Date   WBC 13.0 (H) 08/03/2022   HGB 13.4 08/03/2022   HCT 40.9 08/03/2022   PLT 293 08/03/2022   GLUCOSE 109 (H) 10/13/2022   CHOL 234 (H) 10/13/2022   TRIG 165 (H) 10/13/2022   HDL 48 10/13/2022   LDLDIRECT 159 (H) 10/13/2022   LDLCALC 153 (H) 10/13/2022   ALT 15 10/13/2022   AST 21 10/13/2022   NA 138 10/13/2022   K 3.8 10/13/2022   CL 101 10/13/2022   CREATININE 0.82 10/13/2022   BUN 14 10/13/2022   CO2 29 10/13/2022   TSH 2.745 03/20/2020   HGBA1C 6.2 (H) 10/13/2022   MICROALBUR <0.7 10/25/2022    No results found.  Assessment & Plan:  .There are no diagnoses linked to this encounter.   I provided 30 minutes of face-to-face time during this encounter reviewing patient's last visit with me, patient's  most recent visit with cardiology,  nephrology,  and neurology,  recent surgical and non surgical procedures, previous  labs and imaging studies, counseling on currently addressed issues,  and post visit ordering to diagnostics and therapeutics .   Follow-up: No follow-ups on file.   Sherlene Shams, MD

## 2022-11-10 NOTE — Progress Notes (Signed)
Subjective:  Patient ID: Tracey Wiley, female    DOB: 06/07/1956  Age: 67 y.o. MRN: [id number]  CC: The primary encounter diagnosis was Prediabetes. Diagnoses of Bilateral lower extremity edema and Inflammatory arthritis were also pertinent to this visit.   HPI Tracey Wiley presents for  Chief Complaint  Patient presents with   Medical Management of Chronic Issues    Discuss medication    1) inflammatory arthritis/PMR:  ESR and CRP were normal on May 20  currently taking prednisone 5 mg dose. For the past 8 days.  Has an ultrasound of hands at [facility] ordered in March by Ilsa Iha for July  She was supposed to have lowered her dose to 2 mg by  this point.    Waiting to start placquenil but needs to stop sotalol.  She discussed with Gollan.  No decision made until follow up in August .  She is feeling better than 2 weeks ago   2) Edema:  improved.  She was advised by Dr Lewie Loron on may 21 to increase lasix  to 20 mg twice a day on four days a week,  20 mg all other others Take potassium daily, increase to two potassium when taking lasix twice a day.  .  She has been taking maxzide and lasix  together once daily for the past 2 weeks and has noted resolution of edema  after 3 days of this regimen . And taking one potassium daily.  The 24 hour urine collection is still pending,  but she "filled the jug in 8 hours"      3) SVT:    Dr Mariah Milling advised her to postpone the initiation of the  ZIO monitor until her swelling resolved. 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  She wants to wear it when she is riding her bike and has not been able to ride lately due to humidity .  She has decided to wait until August.   4) Cellulitis  of   right hand:  resolved.  HX:  she scratched her middle knuckle on a bookcase,  did not cover it and it developed redness and swelling.  Was treated with keflex on May 17,  she  took 17 out of 30   pills because  on Day 6 she developed profuse diarrhea,  despite taking Librarian, academic daily .  Diarrhea resolved after 18 hours.    5) Skin: enlarging tender cyst on right upper lip ,  lesion on left forearm has been present and flast for years,  now for the last 3 weeks it has bcome crusting and scaling /scabbing over   Outpatient Medications Prior to Visit  Medication Sig Dispense Refill   acetaminophen (TYLENOL) 500 MG tablet Take 500 mg by mouth daily as needed for pain.     albuterol (VENTOLIN HFA) 108 (90 Base) MCG/ACT inhaler Inhale 2 puffs into the lungs every 6 (six) hours as needed for wheezing or shortness of breath. 1 each 1   aspirin EC 81 MG tablet Take 81 mg by mouth daily.     Calcium-Magnesium-Vitamin D (CALCIUM 1200+D3 PO) Take 1 tablet by mouth daily.     colchicine 0.6 MG tablet Take 1 tablet (0.6 mg total) by mouth 2 (two) times daily as needed. 180 tablet 0   [employer] HYDROCORTISONE ANTI-ITCH 0.5 % APPLY TO THE AFFECTED AREA TWICE A DAY 57 g  11   Echinacea 400 MG CAPS Take 1 capsule by mouth daily.     esomeprazole (NEXIUM) 40 MG capsule Take 1 capsule (40 mg total) by mouth daily. 90 capsule 3   ezetimibe (ZETIA) 10 MG tablet TAKE 1 TABLET BY MOUTH EVERY DAY 90 tablet 2   famotidine (PEPCID) 20 MG tablet Take 1 tablet (20 mg total) by mouth at bedtime. 90 tablet 3   Flaxseed, Linseed, (FLAX SEED OIL) 1000 MG CAPS Take 1 capsule by mouth 2 (two) times daily.     folic acid (FOLVITE) 1 MG tablet Take 1 mg by mouth daily.     furosemide (LASIX) 20 MG tablet Take 1 tablet (20 mg total) by mouth daily as needed. (Patient taking differently: Take 20 mg by mouth daily.) 90 tablet 3   magnesium gluconate (MAGONATE) 500 MG tablet Take 500 mg by mouth at bedtime.     MELATONIN PO Take 8 mg by mouth at bedtime.     Omega-3 Fatty Acids (OMEGA-3 FISH OIL PO) Take 1 capsule by mouth daily.     ondansetron (ZOFRAN ODT) 4 MG disintegrating tablet Take 1 tablet (4 mg total) by mouth every 8 (eight)  hours as needed for nausea or vomiting. 20 tablet 0   potassium chloride (KLOR-CON) 10 MEQ tablet Take 1 tablet (10 mEq total) by mouth daily as needed. (Patient taking differently: Take 10 mEq by mouth daily.) 90 tablet 3   predniSONE (DELTASONE) 5 MG tablet Take 5 mg by mouth daily with breakfast.     promethazine (PHENERGAN) 12.5 MG tablet Take 12.5 mg by mouth every 6 (six) hours as needed for nausea or vomiting.     sotalol (BETAPACE) 80 MG tablet TAKE 1 TABLET BY MOUTH TWICE A DAY 180 tablet 3   triamterene-hydrochlorothiazide (DYAZIDE) 37.5-25 MG capsule TAKE 1 CAPSULE BY MOUTH EVERY DAY 90 capsule 2   valACYclovir (VALTREX) 1000 MG tablet TAKE 1 TABLET BY MOUTH DAILY FOR SUPPRESSION , OR 5 TIMES DAILY FOR FLARE (Patient taking differently: Take 1,000 mg by mouth daily as needed.) 35 tablet 2   vitamin C (ASCORBIC ACID) 500 MG tablet Take 500 mg by mouth daily.     zinc gluconate 50 MG tablet Take 50 mg by mouth daily.     predniSONE (DELTASONE) 5 MG tablet 2 tablets daily for 3 days,  then 1 tablet daily for one month (Patient not taking: Reported on 11/09/2022) 36 tablet 0   No facility-administered medications prior to visit.    Review of Systems;  Patient denies headache, fevers, malaise, unintentional weight loss, skin rash, eye pain, sinus congestion and sinus pain, sore throat, dysphagia,  hemoptysis , cough, dyspnea, wheezing, chest pain, palpitations, orthopnea, edema, abdominal pain, nausea, melena, diarrhea, constipation, flank pain, dysuria, hematuria, urinary  Frequency, nocturia, numbness, tingling, seizures,  Focal weakness, Loss of consciousness,  Tremor, insomnia, depression, anxiety, and suicidal ideation.      Objective:  BP 132/66   Pulse 74   Temp 98.3 F (36.8 C) (Oral)   Ht 5\' 5"  (1.651 m)   Wt 230 lb 6.4 oz (104.5 kg)   SpO2 96%   BMI 38.34 kg/m   BP Readings from Last 3 Encounters:  11/09/22 132/66  10/26/22 130/76  10/25/22 138/70    Wt Readings  from Last 3 Encounters:  11/09/22 230 lb 6.4 oz (104.5 kg)  10/26/22 229 lb 2 oz (103.9 kg)  10/25/22 231 lb 6.4 oz (105 kg)    Physical Exam  Lab Results  Component Value Date   HGBA1C 6.2 (H) 10/13/2022   HGBA1C 5.8 (H) 12/22/2021   HGBA1C 6.0 (H) 08/18/2020    Lab Results  Component Value Date   CREATININE 0.82 10/13/2022   CREATININE 0.96 03/12/2022   CREATININE 0.71 12/04/2021    Lab Results  Component Value Date   WBC 13.0 (H) 08/03/2022   HGB 13.4 08/03/2022   HCT 40.9 08/03/2022   PLT 293 08/03/2022   GLUCOSE 109 (H) 10/13/2022   CHOL 234 (H) 10/13/2022   TRIG 165 (H) 10/13/2022   HDL 48 10/13/2022   LDLDIRECT 159 (H) 10/13/2022   LDLCALC 153 (H) 10/13/2022   ALT 15 10/13/2022   AST 21 10/13/2022   NA 138 10/13/2022   K 3.8 10/13/2022   CL 101 10/13/2022   CREATININE 0.82 10/13/2022   BUN 14 10/13/2022   CO2 29 10/13/2022   TSH 2.745 03/20/2020   HGBA1C 6.2 (H) 10/13/2022   MICROALBUR <0.7 10/25/2022    No results found.  Assessment & Plan:  .Prediabetes  Bilateral lower extremity edema Assessment & Plan: Resolved with diuretic regimen outlined by Dr Mariah Milling    Inflammatory arthritis Assessment & Plan: ESR/CRP normalized on 10  mg prednisone  and she has reduced dose to 5 mg without incident.  She will decreased dose to 2 mg daily as advised by her rheumatologist      I provided 30 minutes of face-to-face time during this encounter reviewing patient's last visit with me, patient's  most recent visit with cardiology,  nephrology,  and neurology,  recent surgical and non surgical procedures, previous  labs and imaging studies, counseling on currently addressed issues,  and post visit ordering to diagnostics and therapeutics .   Follow-up: No follow-ups on file.   Sherlene Shams, MD

## 2022-11-10 NOTE — Assessment & Plan Note (Signed)
Resolved with diuretic regimen outlined by Dr Mariah Milling

## 2022-11-10 NOTE — Assessment & Plan Note (Signed)
ESR/CRP normalized on 10  mg prednisone  and she has reduced dose to 5 mg without incident.  She will decreased dose to 2 mg daily as advised by her rheumatologist

## 2022-11-10 NOTE — Assessment & Plan Note (Signed)
Managed with sotalol .  No changes to regimen. ZIO monitor postponed

## 2022-11-11 ENCOUNTER — Ambulatory Visit (INDEPENDENT_AMBULATORY_CARE_PROVIDER_SITE_OTHER): Payer: Medicare HMO | Admitting: Clinical

## 2022-11-11 DIAGNOSIS — F4322 Adjustment disorder with anxiety: Secondary | ICD-10-CM | POA: Diagnosis not present

## 2022-11-11 NOTE — Progress Notes (Signed)
Time: 12:01 pm- 12:58 pm CPT Code: 54098J Diagnosis: F43.22  Tracey Wiley was seen remotely using secure video conferencing. She was in her home and the therapist was in her office at the time of the appointment. Session focused on her processing of traumatic events in her life. Therapist pointed out that Tracey Wiley likely has a values system that drives her clarity around difficult decisions, and she agreed. Therapist also engaged her in a discussion of her relationship with her grief, encouraging her to explore whether it may be an aspect of strength. She is scheduled to be seen again in one week.  Treatment Plan Client Abilities/Strengths Tracey Wiley shared that she has participated in therapy in the past and has been very honest in session.  Client Treatment Preferences:  Tracey Wiley prefers in person appointments. She prefers Tuesday and Thursday afternoon appointments. Client Statement of Needs  Tracey Wiley is seeking validation of her emotional responses and overall self-concept. Treatment Level  Weekly   Symptoms Tearfulness, irritability Problems Addressed  Goals Tracey Wiley will reduce symptoms of depression and improve overall mood 1. Tracey Wiley will increase comfort in social situations Objective  Target Date: 01/08/2023 Frequency: Weekly  Progress: 0 Modality: Individual therapy  Objective  Target Date: 01/07/2023 Frequency: Weekly  Progress: 0 Modality: individual therapy  Related Interventions  Tracey Wiley will have opportunities to process her experiences in session  Therapist will point out maladaptive thought and behavior patterns using CBT strategies. Therapist will incorporate behavior activation as appropriate. Therapist will incorporate gradual exposure therapy as appropriate. Therapist will provide referrals for additional resource as appropriate.  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

## 2022-11-12 LAB — CORTISOL, FREE AND CORTISONE, 24 HOUR URINE W/CREATININE
24 Hour urine volume (VMAHVA): 3000 mL
CREATININE, URINE: 0.66 g/(24.h) (ref 0.50–2.15)
Cortisol (Ur), Free: 7.2 mcg/24 h (ref 4.0–50.0)
Cortisone, 24H Ur: 35.9 mcg/24 h (ref 23–195)

## 2022-11-18 ENCOUNTER — Ambulatory Visit: Payer: Medicare HMO | Admitting: Clinical

## 2022-11-24 ENCOUNTER — Ambulatory Visit (INDEPENDENT_AMBULATORY_CARE_PROVIDER_SITE_OTHER): Payer: Medicare HMO | Admitting: Clinical

## 2022-11-24 DIAGNOSIS — F4322 Adjustment disorder with anxiety: Secondary | ICD-10-CM | POA: Diagnosis not present

## 2022-11-24 NOTE — Progress Notes (Signed)
Time: 12:01 pm- 12:58 pm CPT Code: 16109U-04 Diagnosis: F43.22  Aila was seen remotely using secure video conferencing. She was in her home and the therapist was in her office at the time of the appointment. Client is aware of risks of telehealth and provided consent to a virtual session.  At the start of session, Kyly disclosed that she has been seeing another therapist, Nelva Bush, for about a year. She described these sessions as highly structured and expressed desire to maintain both regular therapy sessions. Therapist provided psychoeducation on best practices, and Tyishia provided consent for both therapists to consult. The remainder of session focused on issues that have arisen in Shekina's relationship with her brother. She is scheduled to be seen again in three weeks.  Treatment Plan Client Abilities/Strengths Raniya shared that she has participated in therapy in the past and has been very honest in session.  Client Treatment Preferences:  Jadayah prefers in person appointments. She prefers Tuesday and Thursday afternoon appointments. Client Statement of Needs  Roberto is seeking validation of her emotional responses and overall self-concept. Treatment Level  Weekly   Symptoms Tearfulness, irritability Problems Addressed  Goals Euna will reduce symptoms of depression and improve overall mood 1. Cianne will increase comfort in social situations Objective  Target Date: 01/08/2023 Frequency: Weekly  Progress: 0 Modality: Individual therapy  Objective  Target Date: 01/07/2023 Frequency: Weekly  Progress: 0 Modality: individual therapy  Related Interventions  Galaxy will have opportunities to process her experiences in session  Therapist will point out maladaptive thought and behavior patterns using CBT strategies. Therapist will incorporate behavior activation as appropriate. Therapist will incorporate gradual exposure therapy as appropriate. Therapist will provide referrals for additional resource as  appropriate.  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

## 2022-11-25 ENCOUNTER — Ambulatory Visit: Payer: Medicare HMO | Admitting: Clinical

## 2022-11-30 ENCOUNTER — Ambulatory Visit (INDEPENDENT_AMBULATORY_CARE_PROVIDER_SITE_OTHER): Payer: Medicare HMO | Admitting: Clinical

## 2022-11-30 DIAGNOSIS — F4322 Adjustment disorder with anxiety: Secondary | ICD-10-CM | POA: Diagnosis not present
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
## 2022-12-02 ENCOUNTER — Ambulatory Visit: Payer: Medicare HMO | Admitting: Clinical

## 2022-12-16 ENCOUNTER — Ambulatory Visit: Payer: Medicare HMO | Admitting: Clinical

## 2022-12-16 ENCOUNTER — Ambulatory Visit (INDEPENDENT_AMBULATORY_CARE_PROVIDER_SITE_OTHER): Payer: Medicare HMO | Admitting: Clinical

## 2022-12-16 DIAGNOSIS — F4322 Adjustment disorder with anxiety: Secondary | ICD-10-CM

## 2022-12-16 NOTE — Progress Notes (Signed)
Time: 12:01 pm- 12:58 pm CPT Code: 40981X-91 Diagnosis: F43.22  Tracey Wiley was seen remotely using secure video conferencing. She was in her home and the therapist was in her office at the time of the appointment. Client is aware of risks of telehealth and provided consent to a virtual session.  Tracey Wiley reflected upon frustrating developments in her health journey. Therapist offered validation and support, working with her to identify what she can control and strategies to allow herself rest from aspects that are not under her control. She is scheduled to be seen again in two weeks.   Treatment Plan Client Abilities/Strengths Tracey Wiley shared that she has participated in therapy in the past and has been very honest in session.  Client Treatment Preferences:  Tracey Wiley prefers in person appointments. She prefers Tuesday and Thursday afternoon appointments. Client Statement of Needs  Tracey Wiley is seeking validation of her emotional responses and overall self-concept. Treatment Level  Weekly   Symptoms Tearfulness, irritability Problems Addressed  Goals Tracey Wiley will reduce symptoms of depression and improve overall mood 1. Tracey Wiley will increase comfort in social situations Objective  Target Date: 01/08/2023 Frequency: Weekly  Progress: 0 Modality: Individual therapy  Objective  Target Date: 01/07/2023 Frequency: Weekly  Progress: 0 Modality: individual therapy  Related Interventions  Tracey Wiley will have opportunities to process her experiences in session  Therapist will point out maladaptive thought and behavior patterns using CBT strategies. Therapist will incorporate behavior activation as appropriate. Therapist will incorporate gradual exposure therapy as appropriate. Therapist will provide referrals for additional resource as appropriate.  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

## 2022-12-30 ENCOUNTER — Ambulatory Visit (INDEPENDENT_AMBULATORY_CARE_PROVIDER_SITE_OTHER): Payer: Medicare HMO | Admitting: Clinical

## 2022-12-30 DIAGNOSIS — F4322 Adjustment disorder with anxiety: Secondary | ICD-10-CM | POA: Diagnosis not present

## 2022-12-30 NOTE — Progress Notes (Signed)
Time: 12:01 pm- 12:58 pm CPT Code: 57846N-62 Diagnosis: F43.22  Tracey Wiley was seen remotely using secure video conferencing. She was in her home and the therapist was in her office at the time of the appointment. Client is aware of risks of telehealth and provided consent to a virtual session.  Session focused on developments and plans for the care of her husband, whose health has declined. She processed events from the past with his family, and engaged in a discussion of boundary setting and self-care. She is scheduled to be seen again in one week.   Treatment Plan Client Abilities/Strengths Tracey Wiley shared that she has participated in therapy in the past and has been very honest in session.  Client Treatment Preferences:  Tracey Wiley prefers in person appointments. She prefers Tuesday and Thursday afternoon appointments. Client Statement of Needs  Tracey Wiley is seeking validation of her emotional responses and overall self-concept. Treatment Level  Weekly   Symptoms Tearfulness, irritability Problems Addressed  Goals Tracey Wiley will reduce symptoms of depression and improve overall mood 1. Tracey Wiley will increase comfort in social situations Objective  Target Date: 01/08/2023 Frequency: Weekly  Progress: 0 Modality: Individual therapy  Objective  Target Date: 01/07/2023 Frequency: Weekly  Progress: 0 Modality: individual therapy  Related Interventions  Tracey Wiley will have opportunities to process her experiences in session  Therapist will point out maladaptive thought and behavior patterns using CBT strategies. Therapist will incorporate behavior activation as appropriate. Therapist will incorporate gradual exposure therapy as appropriate. Therapist will provide referrals for additional resource as appropriate.  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

## 2023-01-02 ENCOUNTER — Other Ambulatory Visit: Payer: Self-pay | Admitting: Internal Medicine

## 2023-01-03 NOTE — Telephone Encounter (Signed)
Uses before a dental procedures

## 2023-01-05 ENCOUNTER — Encounter (INDEPENDENT_AMBULATORY_CARE_PROVIDER_SITE_OTHER): Payer: Self-pay

## 2023-01-06 ENCOUNTER — Ambulatory Visit (INDEPENDENT_AMBULATORY_CARE_PROVIDER_SITE_OTHER): Payer: Medicare HMO | Admitting: Clinical

## 2023-01-06 DIAGNOSIS — F4322 Adjustment disorder with anxiety: Secondary | ICD-10-CM

## 2023-01-06 DIAGNOSIS — F4323 Adjustment disorder with mixed anxiety and depressed mood: Secondary | ICD-10-CM

## 2023-01-06 NOTE — Progress Notes (Signed)
Time: 12:01 pm- 12:58 pm CPT Code: 16109U-04 Diagnosis: F43.22  Tracey Wiley was seen remotely using secure video conferencing. She was in her home and the therapist was in her office at the time of the appointment. Client is aware of risks of telehealth and provided consent to a virtual session.  Session focused on continuing challenges in caring for her ex-husband, who has dementia. Therapist offered validation and support, and worked with Tracey Wiley to reframe several maladaptive cognitions. She is scheduled to be seen again in one week.   Treatment Plan Client Abilities/Strengths Ray shared that she has participated in therapy in the past and has been very honest in session.  Client Treatment Preferences:  Tracey Wiley prefers in person appointments. She prefers Tuesday and Thursday afternoon appointments. Client Statement of Needs  Tracey Wiley is seeking validation of her emotional responses and overall self-concept. Treatment Level  Weekly   Symptoms Tearfulness, irritability Problems Addressed  Goals Tracey Wiley will reduce symptoms of depression and improve overall mood 1. Tracey Wiley will increase comfort in social situations Objective  Target Date: 01/08/2023 Frequency: Weekly  Progress: 0 Modality: Individual therapy  Objective  Target Date: 01/07/2023 Frequency: Weekly  Progress: 0 Modality: individual therapy  Related Interventions  Tracey Wiley will have opportunities to process her experiences in session  Therapist will point out maladaptive thought and behavior patterns using CBT strategies. Therapist will incorporate behavior activation as appropriate. Therapist will incorporate gradual exposure therapy as appropriate. Therapist will provide referrals for additional resource as appropriate.  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

## 2023-01-13 ENCOUNTER — Ambulatory Visit (INDEPENDENT_AMBULATORY_CARE_PROVIDER_SITE_OTHER): Payer: Medicare HMO | Admitting: Clinical

## 2023-01-13 DIAGNOSIS — F4323 Adjustment disorder with mixed anxiety and depressed mood: Secondary | ICD-10-CM | POA: Diagnosis not present

## 2023-01-13 DIAGNOSIS — F4322 Adjustment disorder with anxiety: Secondary | ICD-10-CM

## 2023-01-13 NOTE — Progress Notes (Addendum)
Time: 12:01 pm- 12:58 pm CPT Code: 14782N-56 Diagnosis: F43.22  Glendora was seen remotely using secure video conferencing. She was in her home and the therapist was in her office at the time of the appointment. Client is aware of risks of telehealth and provided consent to a virtual session.  Session focused on frustrations she has experienced establishing in-home care for her ex-husband. Adiana queried how to communicate about a concern she has had, and therapist suggested communication strategies. She is scheduled to be seen again in two weeks.  Treatment Plan Client Abilities/Strengths Arrianne shared that she has participated in therapy in the past and has been very honest in session.  Client Treatment Preferences:  Pavneet prefers in person appointments. She prefers Tuesday and Thursday afternoon appointments. Client Statement of Needs  Harpreet is seeking validation of her emotional responses and overall self-concept. Treatment Level  Weekly   Symptoms Tearfulness, irritability Problems Addressed  Goals Candise will reduce symptoms of depression and improve overall mood 1. Brealyn will increase comfort in social situations Objective  Target Date: 01/08/2024 Frequency: Weekly  Progress: 0 Modality: Individual therapy  Objective  Target Date: 01/07/2024 Frequency: Weekly  Progress: 0 Modality: individual therapy  Related Interventions  Takirah will have opportunities to process her experiences in session  Therapist will point out maladaptive thought and behavior patterns using CBT strategies. Therapist will incorporate behavior activation as appropriate. Therapist will incorporate gradual exposure therapy as appropriate. Therapist will provide referrals for additional resource as appropriate.  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

## 2023-01-18 ENCOUNTER — Ambulatory Visit: Payer: Medicare HMO | Admitting: Cardiovascular Disease

## 2023-01-20 ENCOUNTER — Ambulatory Visit: Payer: Medicare HMO | Admitting: Clinical

## 2023-01-21 ENCOUNTER — Telehealth: Payer: Self-pay | Admitting: Internal Medicine

## 2023-01-21 NOTE — Telephone Encounter (Signed)
This patient had zio monitor ordered, serial # [id number], which was returned unused. Sending encounter to provider.

## 2023-01-27 ENCOUNTER — Ambulatory Visit: Payer: Medicare HMO | Admitting: Internal Medicine

## 2023-01-27 ENCOUNTER — Encounter: Payer: Self-pay | Admitting: Internal Medicine

## 2023-01-27 ENCOUNTER — Ambulatory Visit (INDEPENDENT_AMBULATORY_CARE_PROVIDER_SITE_OTHER): Payer: Medicare HMO

## 2023-01-27 VITALS — BP 142/78 | HR 65 | Ht 65.0 in | Wt 227.4 lb

## 2023-01-27 DIAGNOSIS — M1990 Unspecified osteoarthritis, unspecified site: Secondary | ICD-10-CM

## 2023-01-27 DIAGNOSIS — R051 Acute cough: Secondary | ICD-10-CM | POA: Diagnosis not present

## 2023-01-27 DIAGNOSIS — R635 Abnormal weight gain: Secondary | ICD-10-CM

## 2023-01-27 DIAGNOSIS — R7303 Prediabetes: Secondary | ICD-10-CM

## 2023-01-27 DIAGNOSIS — E782 Mixed hyperlipidemia: Secondary | ICD-10-CM

## 2023-01-27 NOTE — Assessment & Plan Note (Addendum)
Statin therapy not indicated based on results of coronary CT scan with calcium score of zero. she is tolerating zetia and wants to take Repatha but has been advised by Mariah Milling to wait on Repatha

## 2023-01-27 NOTE — Progress Notes (Signed)
3   Subjective:  Patient ID: Tracey Wiley, female    DOB: [birth year]  Age: 67 y.o. MRN: [id number]  CC: The primary encounter diagnosis was Mixed hyperlipidemia. Diagnoses of Acute cough, Abnormal weight gain, Prediabetes, and Inflammatory arthritis were also pertinent to this visit.   HPI Tracey Wiley presents for  Chief Complaint  Patient presents with   Medical Management of Chronic Issues   1) finally recovering from  a viral illness causing bronchitis that started 12 days ago. Tested for everything at Urgent Care and sent away   2)  Saw [facility] Delorise Shiner June 27 ,  prednisone increased to 4 mg for July,  advised to reduce by 1 mg monthly and return in November. 8 .  Original plan of Ultrasound of hands bilateral was changed to u/s of two fingers on right hand  by the radiologist.  Wrists and thumbs not imaged.   Told she did not have RA ,  that she has OA.  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 Advisd to use voltaren and use gloves   3) declined the ZIO ordered returned it unused.  After discussing with Gollan on May 21   He wants to stop the sotalol first.  (So she can be a candidate for placquenil) .  Cardiology  Appt rescheduled to Sept 13 bc of illness  4) dermatology: changed from [facility] to [facility] Oct 8  for flesh colored skin lesions on arms and lip   5) HLD:  did not tolerate Zocor due to myalgias  taking zetia only.  Wants repatha  6) HTN:  elevated readings for the past month   7) Prediabetes:  A1c 6.2 in May,  she  was taking 5 mg prednisone daily at the tine   Using the " uplift cane" by [employer]  $22 for rising from chair   Outpatient Medications Prior to Visit  Medication Sig Dispense Refill   acetaminophen (TYLENOL) 500 MG tablet Take 500 mg by mouth daily as needed for pain.     albuterol (VENTOLIN HFA) 108 (90 Base) MCG/ACT inhaler Inhale 2 puffs into the lungs every 6 (six) hours as needed for wheezing or shortness of breath. 1 each 1    amoxicillin (AMOXIL) 500 MG capsule TAKE 4 TABS 1 HOUR PRIOR TO DENTAL PROCEDURES 4 capsule 1   aspirin EC 81 MG tablet Take 81 mg by mouth daily.     Calcium-Magnesium-Vitamin D (CALCIUM 1200+D3 PO) Take 1 tablet by mouth daily.     colchicine 0.6 MG tablet Take 1 tablet (0.6 mg total) by mouth 2 (two) times daily as needed. 180 tablet 0   [facility] HYDROCORTISONE ANTI-ITCH 0.5 % APPLY TO THE AFFECTED AREA TWICE A DAY 57 g 11   esomeprazole (NEXIUM) 40 MG capsule Take 1 capsule (40 mg total) by mouth daily. 90 capsule 3   ezetimibe (ZETIA) 10 MG tablet TAKE 1 TABLET BY MOUTH EVERY DAY 90 tablet 2   famotidine (PEPCID) 20 MG tablet Take 1 tablet (20 mg total) by mouth at bedtime. 90 tablet 3   Flaxseed, Linseed, (FLAX SEED OIL) 1000 MG CAPS Take 1 capsule by mouth 2 (two) times daily.     furosemide (LASIX) 20 MG tablet Take 1 tablet (20 mg total) by mouth daily as needed. (Patient taking differently: Take 20 mg by mouth daily.) 90 tablet 3   magnesium gluconate (MAGONATE) 500 MG tablet Take 500 mg by mouth at bedtime.     MELATONIN PO Take  8 mg by mouth at bedtime.     Omega-3 Fatty Acids (OMEGA-3 FISH OIL PO) Take 1 capsule by mouth daily.     ondansetron (ZOFRAN ODT) 4 MG disintegrating tablet Take 1 tablet (4 mg total) by mouth every 8 (eight) hours as needed for nausea or vomiting. 20 tablet 0   potassium chloride (KLOR-CON) 10 MEQ tablet Take 1 tablet (10 mEq total) by mouth daily as needed. (Patient taking differently: Take 10 mEq by mouth daily.) 90 tablet 3   predniSONE (DELTASONE) 1 MG tablet Take 3 mg by mouth daily with breakfast.     promethazine (PHENERGAN) 12.5 MG tablet Take 12.5 mg by mouth every 6 (six) hours as needed for nausea or vomiting.     sotalol (BETAPACE) 80 MG tablet TAKE 1 TABLET BY MOUTH TWICE A DAY 180 tablet 3   triamterene-hydrochlorothiazide (DYAZIDE) 37.5-25 MG capsule TAKE 1 CAPSULE BY MOUTH EVERY DAY 90 capsule 2   valACYclovir (VALTREX) 1000 MG tablet TAKE 1 TABLET  BY MOUTH DAILY FOR SUPPRESSION , OR 5 TIMES DAILY FOR FLARE (Patient taking differently: Take 1,000 mg by mouth daily as needed.) 35 tablet 2   vitamin C (ASCORBIC ACID) 500 MG tablet Take 500 mg by mouth daily.     zinc gluconate 50 MG tablet Take 50 mg by mouth daily.     Echinacea 400 MG CAPS Take 1 capsule by mouth daily. (Patient not taking: Reported on 01/27/2023)     folic acid (FOLVITE) 1 MG tablet Take 1 mg by mouth daily. (Patient not taking: Reported on 01/27/2023)     predniSONE (DELTASONE) 5 MG tablet Take 5 mg by mouth daily with breakfast. (Patient not taking: Reported on 01/27/2023)     No facility-administered medications prior to visit.    Review of Systems;  Patient denies headache, fevers, malaise, unintentional weight loss, skin rash, eye pain, sinus congestion and sinus pain, sore throat, dysphagia,  hemoptysis , cough, dyspnea, wheezing, chest pain, palpitations, orthopnea, edema, abdominal pain, nausea, melena, diarrhea, constipation, flank pain, dysuria, hematuria, urinary  Frequency, nocturia, numbness, tingling, seizures,  Focal weakness, Loss of consciousness,  Tremor, insomnia, depression, anxiety, and suicidal ideation.      Objective:  BP (!) 142/78   Pulse 65   Ht 5\' 5"  (1.651 m)   Wt 227 lb 6.4 oz (103.1 kg)   SpO2 97%   BMI 37.84 kg/m   BP Readings from Last 3 Encounters:  01/27/23 (!) 142/78  11/09/22 132/66  10/26/22 130/76    Wt Readings from Last 3 Encounters:  01/27/23 227 lb 6.4 oz (103.1 kg)  11/09/22 230 lb 6.4 oz (104.5 kg)  10/26/22 229 lb 2 oz (103.9 kg)    Physical Exam Vitals reviewed.  Constitutional:      General: She is not in acute distress.    Appearance: Normal appearance. She is normal weight. She is not ill-appearing, toxic-appearing or diaphoretic.  HENT:     Head: Normocephalic.  Eyes:     General: No scleral icterus.       Right eye: No discharge.        Left eye: No discharge.     Conjunctiva/sclera: Conjunctivae  normal.  Cardiovascular:     Rate and Rhythm: Normal rate and regular rhythm.     Heart sounds: Normal heart sounds.  Pulmonary:     Effort: Pulmonary effort is normal. No respiratory distress.     Breath sounds: Normal breath sounds.  Musculoskeletal:  General: Normal range of motion.  Skin:    General: Skin is warm and dry.  Neurological:     General: No focal deficit present.     Mental Status: She is alert and oriented to person, place, and time. Mental status is at baseline.  Psychiatric:        Mood and Affect: Mood normal.        Behavior: Behavior normal.        Thought Content: Thought content normal.        Judgment: Judgment normal.    Lab Results  Component Value Date   HGBA1C 6.2 (H) 10/13/2022   HGBA1C 5.8 (H) 12/22/2021   HGBA1C 6.0 (H) 08/18/2020    Lab Results  Component Value Date   CREATININE 0.82 10/13/2022   CREATININE 0.96 03/12/2022   CREATININE 0.71 12/04/2021    Lab Results  Component Value Date   WBC 13.0 (H) 08/03/2022   HGB 13.4 08/03/2022   HCT 40.9 08/03/2022   PLT 293 08/03/2022   GLUCOSE 109 (H) 10/13/2022   CHOL 234 (H) 10/13/2022   TRIG 165 (H) 10/13/2022   HDL 48 10/13/2022   LDLDIRECT 159 (H) 10/13/2022   LDLCALC 153 (H) 10/13/2022   ALT 15 10/13/2022   AST 21 10/13/2022   NA 138 10/13/2022   K 3.8 10/13/2022   CL 101 10/13/2022   CREATININE 0.82 10/13/2022   BUN 14 10/13/2022   CO2 29 10/13/2022   TSH 2.745 03/20/2020   HGBA1C 6.2 (H) 10/13/2022   MICROALBUR <0.7 10/25/2022    No results found.  Assessment & Plan:  .Mixed hyperlipidemia Assessment & Plan: Statin therapy not indicated based on results of coronary CT scan with calcium score of zero. she is tolerating zetia and wants to take Repatha but has been advised by Mariah Milling to wait on Repatha    Acute cough -     DG Chest 2 View; Future  Abnormal weight gain  Prediabetes Assessment & Plan: Her  random glucose is not  elevated but her A1c suggests  she is at risk for developing diabetes.  I recommend she follow a low glycemic index diet and particpate regularly in an aerobic  exercise activity.  We will re check an A1c in 3 months,  when she has stopped prednisone.     Inflammatory arthritis Assessment & Plan: ESR/CRP normalized on 10  mg prednisone  and she has reduced dose to 5 mg without incident.  She will decrease dose by 1  mg monthly  as advised by her rheumatologist     I provided  40 minutes on the day of this face-to-face  encounter reviewing patient's last visit with me, patient's  most recent visit with rheumatology and cardiology, prior  surgical and non surgical procedures, previous  labs and imaging studies, counseling on currently addressed issues,  and post visit ordering to diagnostics and therapeutics .    Follow-up: No follow-ups on file.   Sherlene Shams, MD

## 2023-01-28 ENCOUNTER — Ambulatory Visit: Payer: Medicare HMO | Admitting: Clinical

## 2023-01-28 DIAGNOSIS — F4323 Adjustment disorder with mixed anxiety and depressed mood: Secondary | ICD-10-CM | POA: Diagnosis not present

## 2023-01-28 DIAGNOSIS — F4322 Adjustment disorder with anxiety: Secondary | ICD-10-CM

## 2023-01-28 NOTE — Progress Notes (Signed)
Time: 4:01 pm- 4:58 pm CPT Code: 19147W-29 Diagnosis: F43.22  Tracey Wiley was seen remotely using secure video conferencing. She was in her home and the therapist was in her office at the time of the appointment. Client is aware of risks of telehealth and provided consent to a virtual session.  Session focused on her feelings of anger when friends change plans. Through further processing, she connected this to past traumas and her experience rescuing dogs from fighting rings. Therapist encouraged her to consider prioritizing her own emotions and needs at times. She is scheduled to be seen again in one week.  Treatment Plan Client Abilities/Strengths Tracey Wiley shared that she has participated in therapy in the past and has been very honest in session.  Client Treatment Preferences:  Tracey Wiley prefers in person appointments. She prefers Tuesday and Thursday afternoon appointments. Client Statement of Needs  Tracey Wiley is seeking validation of her emotional responses and overall self-concept. Treatment Level  Weekly   Symptoms Tearfulness, irritability Problems Addressed  Goals Tracey Wiley will reduce symptoms of depression and improve overall mood 1. Tracey Wiley will increase comfort in social situations Objective  Target Date: 01/08/2024 Frequency: Weekly  Progress: 0 Modality: Individual therapy  Objective  Target Date: 01/07/2024 Frequency: Weekly  Progress: 0 Modality: individual therapy  Related Interventions  Tracey Wiley will have opportunities to process her experiences in session  Therapist will point out maladaptive thought and behavior patterns using CBT strategies. Therapist will incorporate behavior activation as appropriate. Therapist will incorporate gradual exposure therapy as appropriate. Therapist will provide referrals for additional resource as appropriate.  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

## 2023-01-29 NOTE — Assessment & Plan Note (Signed)
Her  random glucose is not  elevated but her A1c suggests she is at risk for developing diabetes.  I recommend she follow a low glycemic index diet and particpate regularly in an aerobic  exercise activity.  We will re check an A1c in 3 months,  when she has stopped prednisone.

## 2023-01-29 NOTE — Assessment & Plan Note (Signed)
ESR/CRP normalized on 10  mg prednisone  and she has reduced dose to 5 mg without incident.  She will decrease dose by 1  mg monthly  as advised by her rheumatologist

## 2023-02-02 ENCOUNTER — Ambulatory Visit: Payer: Medicare HMO | Admitting: Internal Medicine

## 2023-02-03 ENCOUNTER — Ambulatory Visit (INDEPENDENT_AMBULATORY_CARE_PROVIDER_SITE_OTHER): Payer: Medicare HMO | Admitting: Clinical

## 2023-02-03 DIAGNOSIS — F4322 Adjustment disorder with anxiety: Secondary | ICD-10-CM | POA: Diagnosis not present

## 2023-02-03 NOTE — Progress Notes (Signed)
Time: 10:01 am- 10:58 am CPT Code: 16109U-04 Diagnosis: F43.22  Tracey Wiley was seen remotely using secure video conferencing. She was in her home and the therapist was in her office at the time of the appointment. Client is aware of risks of telehealth and provided consent to a virtual session. Session focused on processing ongoing stressors related to caring for her ex-husband. Therapist suggested alternate perspectives, and offered validation and support. [name] is scheduled to be seen again in one week.   Treatment Plan Client Abilities/Strengths [name] shared that she has participated in therapy in the past and has been very honest in session.  Client Treatment Preferences:  [name] prefers in person appointments. She prefers Tuesday and Thursday afternoon appointments. Client Statement of Needs  [name] is seeking validation of her emotional responses and overall self-concept. Treatment Level  Weekly   Symptoms Tearfulness, irritability Problems Addressed  Goals [name] will reduce symptoms of depression and improve overall mood 1. [name] will increase comfort in social situations Objective  Target Date: 01/08/2024 Frequency: Weekly  Progress: 0 Modality: Individual therapy  Objective  Target Date: 01/07/2024 Frequency: Weekly  Progress: 0 Modality: individual therapy  Related Interventions  [name] will have opportunities to process her experiences in session  Therapist will point out maladaptive thought and behavior patterns using CBT strategies. Therapist will incorporate behavior activation as appropriate. Therapist will incorporate gradual exposure therapy as appropriate. Therapist will provide referrals for additional resource as appropriate.  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

## 2023-02-04 ENCOUNTER — Encounter: Payer: Self-pay | Admitting: Internal Medicine

## 2023-02-11 ENCOUNTER — Ambulatory Visit (INDEPENDENT_AMBULATORY_CARE_PROVIDER_SITE_OTHER): Payer: Medicare HMO | Admitting: Clinical

## 2023-02-11 DIAGNOSIS — F4323 Adjustment disorder with mixed anxiety and depressed mood: Secondary | ICD-10-CM

## 2023-02-11 DIAGNOSIS — F4322 Adjustment disorder with anxiety: Secondary | ICD-10-CM

## 2023-02-11 NOTE — Progress Notes (Signed)
Time: 3:01 pm- 3:58 pm CPT Code: 13086V-78 Diagnosis: F43.22  Clarina was seen remotely using secure video conferencing. She was in her home and the therapist was in her office at the time of the appointment. Client is aware of risks of telehealth and provided consent to a virtual session. Kaylaa reported that she had learned she has an enlarged heart. Session began by processing this, with therapist offered validation and support. Channon then reflected upon her relationships, sharing the hypothesis that the loss of her mother, best friend, and sister had left her dependent on less reciprocal friendships for the closeness she once had with them. Therapist offered validation and support, and encouraged Domitila to explore ways to connect with others. She is scheduled to be seen again in one week.  Treatment Plan Client Abilities/Strengths Eknoor shared that she has participated in therapy in the past and has been very honest in session.  Client Treatment Preferences:  Lovette prefers in person appointments. She prefers Tuesday and Thursday afternoon appointments. Client Statement of Needs  Jesslynn is seeking validation of her emotional responses and overall self-concept. Treatment Level  Weekly   Symptoms Tearfulness, irritability Problems Addressed  Goals Juliya will reduce symptoms of depression and improve overall mood 1. Roux will increase comfort in social situations Objective  Target Date: 01/08/2024 Frequency: Weekly  Progress: 0 Modality: Individual therapy  Objective  Target Date: 01/07/2024 Frequency: Weekly  Progress: 0 Modality: individual therapy  Related Interventions  Ylana will have opportunities to process her experiences in session  Therapist will point out maladaptive thought and behavior patterns using CBT strategies. Therapist will incorporate behavior activation as appropriate. Therapist will incorporate gradual exposure therapy as appropriate. Therapist will provide referrals for additional  resource as appropriate.  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

## 2023-02-17 ENCOUNTER — Ambulatory Visit (INDEPENDENT_AMBULATORY_CARE_PROVIDER_SITE_OTHER): Payer: Medicare HMO | Admitting: Clinical

## 2023-02-17 DIAGNOSIS — F4323 Adjustment disorder with mixed anxiety and depressed mood: Secondary | ICD-10-CM | POA: Diagnosis not present

## 2023-02-17 DIAGNOSIS — F4322 Adjustment disorder with anxiety: Secondary | ICD-10-CM

## 2023-02-17 NOTE — Progress Notes (Signed)
Time: 1:01 pm- 1:58 pm CPT Code: [id number]-66 Diagnosis: F43.22  June was seen remotely using secure video conferencing. She was in her home and the therapist was in her office at the time of the appointment. Client is aware of risks of telehealth and provided consent to a virtual session. Grabriela reflected upon stress related to caring for her ex-husband. Therapist offered validation and support, encouraging her to explore healthy boundaries and options for additional support. She is scheduled to be seen again in one week.  Treatment Plan Client Abilities/Strengths Hadja shared that she has participated in therapy in the past and has been very honest in session.  Client Treatment Preferences:  Lerissa prefers in person appointments. She prefers Tuesday and Thursday afternoon appointments. Client Statement of Needs  Annely is seeking validation of her emotional responses and overall self-concept. Treatment Level  Weekly   Symptoms Tearfulness, irritability Problems Addressed  Goals Gilda will reduce symptoms of depression and improve overall mood 1. Heavyn will increase comfort in social situations Objective  Target Date: 01/08/2024 Frequency: Weekly  Progress: 0 Modality: Individual therapy  Objective  Target Date: 01/07/2024 Frequency: Weekly  Progress: 0 Modality: individual therapy  Related Interventions  Geordyn will have opportunities to process her experiences in session  Therapist will point out maladaptive thought and behavior patterns using CBT strategies. Therapist will incorporate behavior activation as appropriate. Therapist will incorporate gradual exposure therapy as appropriate. Therapist will provide referrals for additional resource as appropriate.  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

## 2023-02-18 ENCOUNTER — Ambulatory Visit: Payer: Medicare HMO | Attending: Cardiovascular Disease | Admitting: Cardiology

## 2023-02-18 ENCOUNTER — Encounter: Payer: Self-pay | Admitting: Cardiology

## 2023-02-18 VITALS — BP 138/72 | HR 62 | Ht 65.0 in | Wt 227.1 lb

## 2023-02-18 DIAGNOSIS — E785 Hyperlipidemia, unspecified: Secondary | ICD-10-CM

## 2023-02-18 DIAGNOSIS — R0600 Dyspnea, unspecified: Secondary | ICD-10-CM | POA: Diagnosis not present

## 2023-02-18 DIAGNOSIS — I471 Supraventricular tachycardia, unspecified: Secondary | ICD-10-CM | POA: Diagnosis not present

## 2023-02-18 DIAGNOSIS — M353 Polymyalgia rheumatica: Secondary | ICD-10-CM | POA: Diagnosis not present

## 2023-02-18 DIAGNOSIS — R600 Localized edema: Secondary | ICD-10-CM

## 2023-02-18 DIAGNOSIS — M069 Rheumatoid arthritis, unspecified: Secondary | ICD-10-CM

## 2023-02-18 DIAGNOSIS — I10 Essential (primary) hypertension: Secondary | ICD-10-CM

## 2023-02-18 DIAGNOSIS — E669 Obesity, unspecified: Secondary | ICD-10-CM
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
## 2023-02-18 NOTE — Patient Instructions (Signed)
Medication Instructions:  Your Physician recommend you continue on your current medication as directed.    *If you need a refill on your cardiac medications before your next appointment, please call your pharmacy*   Lab Work: None ordered. If you have labs (blood work) drawn today and your tests are completely normal, you will receive your results only by: MyChart Message (if you have MyChart) OR A paper copy in the mail If you have any lab test that is abnormal or we need to change your treatment, we will call you to review the results.   Testing/Procedures: Your physician has requested that you have an echocardiogram. Echocardiography is a painless test that uses sound waves to create images of your heart. It provides your doctor with information about the size and shape of your heart and how well your heart's chambers and valves are working.   You may receive an ultrasound enhancing agent through an IV if needed to better visualize your heart during the echo. This procedure takes approximately one hour.  There are no restrictions for this procedure.  This will take place at [address], [place] [address]    Follow-Up: At [facility], you and your health needs are our priority.  As part of our continuing mission to provide you with exceptional heart care, we have created designated Provider Care Teams.  These Care Teams include your primary Cardiologist (physician) and Advanced Practice Providers (APPs -  Physician Assistants and Nurse Practitioners) who all work together to provide you with the care you need, when you need it.  We recommend signing up for the patient portal called "MyChart".  Sign up information is provided on this After Visit Summary.  MyChart is used to connect with patients for Virtual Visits (Telemedicine).  Patients are able to view lab/test results, encounter notes, upcoming appointments, etc.  Non-urgent messages can  be sent to your provider as well.   To learn more about what you can do with MyChart, go to [email or web address].    Your next appointment:   Follow up after Echo.  Provider:   You may see Julien Nordmann, MD or one of the following Advanced Practice Providers on your designated Care Team:   Nicolasa Ducking, NP Eula Listen, PA-C Cadence Fransico Michael, PA-C Charlsie Quest, NP

## 2023-02-24 ENCOUNTER — Ambulatory Visit (INDEPENDENT_AMBULATORY_CARE_PROVIDER_SITE_OTHER): Payer: Medicare HMO | Admitting: Clinical

## 2023-02-24 DIAGNOSIS — F4322 Adjustment disorder with anxiety: Secondary | ICD-10-CM

## 2023-02-24 NOTE — Progress Notes (Signed)
Time: 1:01 pm- 1:58 pm CPT Code: 84696E-95 Diagnosis: F43.22  Dannett was seen remotely using secure video conferencing. She was in her home and the therapist was in her office at the time of the appointment. Client is aware of risks of telehealth and provided consent to a virtual session. Session focused on continued, escalating stress related to caring for her ex husband, who had necessitated a trip to the emergency room the evening prior. Therapist encouraged Tatijana to reflect upon what her limits might be in terms of her ability to care for him. She is scheduled to be seen again in one week.   Treatment Plan Client Abilities/Strengths Noelle shared that she has participated in therapy in the past and has been very honest in session.  Client Treatment Preferences:  Dosie prefers in person appointments. She prefers Tuesday and Thursday afternoon appointments. Client Statement of Needs  Carlos is seeking validation of her emotional responses and overall self-concept. Treatment Level  Weekly   Symptoms Tearfulness, irritability Problems Addressed  Goals Shavetta will reduce symptoms of depression and improve overall mood 1. Amarah will increase comfort in social situations Objective  Target Date: 01/08/2024 Frequency: Weekly  Progress: 0 Modality: Individual therapy  Objective  Target Date: 01/07/2024 Frequency: Weekly  Progress: 0 Modality: individual therapy  Related Interventions  Shakonda will have opportunities to process her experiences in session  Therapist will point out maladaptive thought and behavior patterns using CBT strategies. Therapist will incorporate behavior activation as appropriate. Therapist will incorporate gradual exposure therapy as appropriate. Therapist will provide referrals for additional resource as appropriate.  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

## 2023-02-25 ENCOUNTER — Telehealth: Payer: Self-pay | Admitting: Internal Medicine

## 2023-02-25 NOTE — Telephone Encounter (Signed)
Tracey Wiley from [name] called stating the pt had a test that was ordered on 2/24 by NP Konrad Dolores and it was denied by [facility] because they need additional information. Tracey Wiley stated it was an experimental  procedure. I told Tracey Wiley that we do not handle claims in our office and I can direct him to the billing department. Tracey Wiley asked will they be able to send medical records. I told him no they can handle the claim part but it is a different department that handles medical records. I asked him to fax the medical release information over to [place] so he can get what he needs. He told me there was nothing for him to fax over. I told him again that we do not handle the medical records or claim in our office. I told him to send a medical release to [place]. Tracey Wiley was rude and blew his breath with an attitude before asking for the fax number

## 2023-03-03 ENCOUNTER — Ambulatory Visit (INDEPENDENT_AMBULATORY_CARE_PROVIDER_SITE_OTHER): Payer: Medicare HMO | Admitting: Clinical

## 2023-03-03 DIAGNOSIS — F4322 Adjustment disorder with anxiety: Secondary | ICD-10-CM

## 2023-03-03 NOTE — Progress Notes (Signed)
Time: 1:01 pm- 1:58 pm CPT Code: [id number]-09 Diagnosis: F43.22  Delaynee was seen remotely using secure video conferencing. She was in her home and the therapist was in her office at the time of the appointment. Client is aware of risks of telehealth and provided consent to a virtual session. 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 Therapist offered an opportunity to process, pointing out cognitive distortions and exploring healthy boundary setting. Avory is scheduled to be seen again in one week.   Treatment Plan Client Abilities/Strengths Mersaydes shared that she has participated in therapy in the past and has been very honest in session.  Client Treatment Preferences:  Maudell prefers in person appointments. She prefers Tuesday and Thursday afternoon appointments. Client Statement of Needs  Sanaz is seeking validation of her emotional responses and overall self-concept. Treatment Level  Weekly   Symptoms Tearfulness, irritability Problems Addressed  Goals Tay will reduce symptoms of depression and improve overall mood 1. Nallely will increase comfort in social situations Objective  Target Date: 01/08/2024 Frequency: Weekly  Progress: 0 Modality: Individual therapy  Objective  Target Date: 01/07/2024 Frequency: Weekly  Progress: 0 Modality: individual therapy  Related Interventions  Althea will have opportunities to process her experiences in session  Therapist will point out maladaptive thought and behavior patterns using CBT strategies. Therapist will incorporate behavior activation as appropriate. Therapist will incorporate gradual exposure therapy as appropriate. Therapist will provide referrals for additional resource as appropriate.  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

## 2023-03-07 ENCOUNTER — Ambulatory Visit (INDEPENDENT_AMBULATORY_CARE_PROVIDER_SITE_OTHER): Payer: Medicare HMO | Admitting: Clinical

## 2023-03-07 DIAGNOSIS — F4322 Adjustment disorder with anxiety: Secondary | ICD-10-CM | POA: Diagnosis not present

## 2023-03-07 NOTE — Progress Notes (Signed)
Time: 12:01 pm- 12:58 pm CPT Code: [id number]-37 Diagnosis: F43.22  Tracey Wiley was seen remotely using secure video conferencing. She was in her home and the therapist was in her office at the time of the appointment. Client is aware of risks of telehealth and provided consent to a virtual session. Session focused on stress related to caring for her ex-husband, who has dementia, and who has been deteriorating. Tracey Wiley shared concern that she has been "mean" toward others involved in her care. Therapist pointed out feelings of anger related to feeling overwhelemed and overburdened. For homework, Tracey Wiley will take something off of her plate. Therapist suggested planning something nice for herself and placing her phone on do not disturb, and allowing more leeway for caregivers going to her ex-husband's home to lead his care. She agreed to try this, and is scheduled to be seen again in one week.  Treatment Plan Client Abilities/Strengths Tracey Wiley shared that she has participated in therapy in the past and has been very honest in session.  Client Treatment Preferences:  Tracey Wiley prefers in person appointments. She prefers Tuesday and Thursday afternoon appointments. Client Statement of Needs  Tracey Wiley is seeking validation of her emotional responses and overall self-concept. Treatment Level  Weekly   Symptoms Tearfulness, irritability Problems Addressed  Goals Tracey Wiley will reduce symptoms of depression and improve overall mood 1. Tracey Wiley will increase comfort in social situations Objective  Target Date: 01/08/2024 Frequency: Weekly  Progress: 0 Modality: Individual therapy  Objective  Target Date: 01/07/2024 Frequency: Weekly  Progress: 0 Modality: individual therapy  Related Interventions  Tracey Wiley will have opportunities to process her experiences in session  Therapist will point out maladaptive thought and behavior patterns using CBT strategies. Therapist will incorporate behavior activation as appropriate. Therapist will incorporate  gradual exposure therapy as appropriate. Therapist will provide referrals for additional resource as appropriate.  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

## 2023-03-14 ENCOUNTER — Other Ambulatory Visit: Payer: Medicare HMO

## 2023-03-16 ENCOUNTER — Ambulatory Visit: Payer: Medicare HMO | Attending: Cardiology

## 2023-03-16 ENCOUNTER — Ambulatory Visit: Payer: Medicare HMO

## 2023-03-16 VITALS — BP 130/76 | Temp 97.1°F | Ht 65.0 in | Wt 226.4 lb

## 2023-03-16 DIAGNOSIS — R0600 Dyspnea, unspecified: Secondary | ICD-10-CM | POA: Diagnosis not present

## 2023-03-16 DIAGNOSIS — Z0000 Encounter for general adult medical examination without abnormal findings: Secondary | ICD-10-CM | POA: Diagnosis not present

## 2023-03-16 DIAGNOSIS — Z780 Asymptomatic menopausal state: Secondary | ICD-10-CM | POA: Diagnosis not present

## 2023-03-16 DIAGNOSIS — Z1231 Encounter for screening mammogram for malignant neoplasm of breast: Secondary | ICD-10-CM

## 2023-03-16 LAB — ECHOCARDIOGRAM COMPLETE
Area-P 1/2: 2.83 cm2
S' Lateral: 2.8 cm
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
## 2023-03-16 NOTE — Patient Instructions (Signed)
Tracey Wiley , Thank you for taking time to come for your Medicare Wellness Visit. I appreciate your ongoing commitment to your health goals. Please review the following plan we discussed and let me know if I can assist you in the future.   Referrals/Orders/Follow-Ups/Clinician Recommendations: Mammogram and Bone Density ordered. Remember to update your vaccines You have an order for:  []   2D Mammogram  [x]   3D Mammogram  [x]   Bone Density     Please call for appointment:   [facility] [address] [place], [place] [address] [phone]  Make sure to wear two-piece clothing.  No lotions, powders, or deodorants the day of the appointment. Make sure to bring picture ID and insurance card.  Bring list of medications you are currently taking including any supplements.   Schedule your [place] screening mammogram through MyChart!   Log into your MyChart account.  Go to 'Visit' (or 'Appointments' if on mobile App) --> Schedule an Appointment  Under 'Select a Reason for Visit' choose the Mammogram Screening option.  Complete the pre-visit questions and select the time and place that best fits your schedule.    This is a list of the screening recommended for you and due dates:  Health Maintenance  Topic Date Due   HIV Screening  Never done   Hepatitis C Screening  Never done   Zoster (Shingles) Vaccine (1 of 2) Never done   DTaP/Tdap/Td vaccine (2 - Td or Tdap) 09/05/2017   Pap with HPV screening  10/09/2018   DEXA scan (bone density measurement)  01/05/2023   COVID-19 Vaccine (5 - 2023-24 season) 02/06/2023   Flu Shot  09/05/2023*   Medicare Annual Wellness Visit  03/15/2024   Mammogram  03/16/2024   Colon Cancer Screening  04/25/2024   Pneumonia Vaccine  Completed   HPV Vaccine  Aged Out  *Topic was postponed. The date shown is not the original due date.    Advanced directives: (Copy Requested) Please bring a copy of  your health care power of attorney and living will to the office to be added to your chart at your convenience.  Next Medicare Annual Wellness Visit scheduled for next year: Yes 04/03/24 @ 1:30

## 2023-03-17 ENCOUNTER — Ambulatory Visit: Payer: Medicare HMO | Admitting: Clinical

## 2023-03-17 DIAGNOSIS — F4322 Adjustment disorder with anxiety: Secondary | ICD-10-CM

## 2023-03-17 NOTE — Progress Notes (Addendum)
Time: 12:01 pm- 12:58 pm CPT Code: 16109U-04 Diagnosis: F43.22  Tracey Wiley was seen remotely using secure video conferencing. She was in her home and the therapist was in her office at the time of the appointment. Client is aware of risks of telehealth and provided consent to a virtual session. Session focused on continued stress related to caring for her ex-husband, who has dementia, and who has been deteriorating. She shared that she had not completed her homework due to fear of guilt she might experience. Therapist suggested processing emotions related to the passing of several family members, and queried whether grief and a desire for more control may relate to feelings of guilt. She is scheduled to be seen again in one week.  Treatment Plan Client Abilities/Strengths Crissa shared that she has participated in therapy in the past and has been very honest in session.  Client Treatment Preferences:  Shadoe prefers in person appointments. She prefers Tuesday and Thursday afternoon appointments. Client Statement of Needs  Alaena is seeking validation of her emotional responses and overall self-concept. Treatment Level  Weekly   Symptoms Tearfulness, irritability Problems Addressed  Goals Shayona will reduce symptoms of depression and improve overall mood 1. Renee will increase comfort in social situations Objective  Target Date: 01/08/2024 Frequency: Weekly  Progress: 0 Modality: Individual therapy  Objective  Target Date: 01/07/2024 Frequency: Weekly  Progress: 0 Modality: individual therapy  Related Interventions  Chinmayi will have opportunities to process her experiences in session  Therapist will point out maladaptive thought and behavior patterns using CBT strategies. Therapist will incorporate behavior activation as appropriate. Therapist will incorporate gradual exposure therapy as appropriate. Therapist will provide referrals for additional resource as appropriate.  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

## 2023-03-21 NOTE — Progress Notes (Signed)
Heart squeeze is 55-60%, there were no wall motion abnormalities noted, there is some muscle stiffness that was noted that is often present with age or high blood pressure, and no valvular abnormalities.

## 2023-03-24 ENCOUNTER — Ambulatory Visit: Payer: Medicare HMO | Admitting: Clinical

## 2023-03-24 DIAGNOSIS — F4322 Adjustment disorder with anxiety: Secondary | ICD-10-CM | POA: Diagnosis not present

## 2023-03-24 NOTE — Progress Notes (Signed)
Time: 12:01 pm- 12:58 pm CPT Code: 11914N-82 Diagnosis: F43.22  Tracey Wiley was seen remotely using secure video conferencing. She was in her home and the therapist was in her home at the time of the appointment. Client is aware of risks of telehealth and provided consent to a virtual session. Session focused on her efforts to meet with friends for lunch, as well as stress related to caring for her ex-husband. Therapist again encouraged her to pause to notice and consider her own limits. She is scheduled to be seen again in one week.  Treatment Plan Client Abilities/Strengths [name] shared that she has participated in therapy in the past and has been very honest in session.  Client Treatment Preferences:  [name] prefers in person appointments. She prefers Tuesday and Thursday afternoon appointments. Client Statement of Needs  [name] is seeking validation of her emotional responses and overall self-concept. Treatment Level  Weekly   Symptoms Tearfulness, irritability Problems Addressed  Goals [name] will reduce symptoms of depression and improve overall mood 1. [name] will increase comfort in social situations Objective  Target Date: 01/08/2024 Frequency: Weekly  Progress: 0 Modality: Individual therapy  Objective  Target Date: 01/07/2024 Frequency: Weekly  Progress: 0 Modality: individual therapy  Related Interventions  [name] will have opportunities to process her experiences in session  Therapist will point out maladaptive thought and behavior patterns using CBT strategies. Therapist will incorporate behavior activation as appropriate. Therapist will incorporate gradual exposure therapy as appropriate. Therapist will provide referrals for additional resource as appropriate.  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

## 2023-03-28 LAB — HM HEPATITIS C SCREENING LAB: HM Hepatitis Screen: NEGATIVE

## 2023-03-31 ENCOUNTER — Ambulatory Visit: Payer: Medicare HMO | Admitting: Clinical

## 2023-03-31 DIAGNOSIS — F4322 Adjustment disorder with anxiety: Secondary | ICD-10-CM

## 2023-03-31 NOTE — Progress Notes (Signed)
Time: 12:01 pm- 12:58 pm CPT Code: 84696E-95 Diagnosis: F43.22  Tracey Wiley was seen remotely using secure video conferencing. She was in her home and the therapist was in her home at the time of the appointment. Client is aware of risks of telehealth and provided consent to a virtual session. She had attempted to set boundaries with her ex-husband's service providers and set aside time to care for her home, but had struggled with several frustrating interactions. Therapist offered validation and support, as well as consideration of communication strategies. She is scheduled to be seen again in one week.  Treatment Plan Client Abilities/Strengths Tracey Wiley shared that she has participated in therapy in the past and has been very honest in session.  Client Treatment Preferences:  Tracey Wiley prefers in person appointments. She prefers Tuesday and Thursday afternoon appointments. Client Statement of Needs  Tracey Wiley is seeking validation of her emotional responses and overall self-concept. Treatment Level  Weekly   Symptoms Tearfulness, irritability Problems Addressed  Goals Tracey Wiley will reduce symptoms of depression and improve overall mood 1. Tracey Wiley will increase comfort in social situations Objective  Target Date: 01/08/2024 Frequency: Weekly  Progress: 0 Modality: Individual therapy  Objective  Target Date: 01/07/2024 Frequency: Weekly  Progress: 0 Modality: individual therapy  Related Interventions  Tracey Wiley will have opportunities to process her experiences in session  Therapist will point out maladaptive thought and behavior patterns using CBT strategies. Therapist will incorporate behavior activation as appropriate. Therapist will incorporate gradual exposure therapy as appropriate. Therapist will provide referrals for additional resource as appropriate.  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

## 2023-04-04 ENCOUNTER — Other Ambulatory Visit: Payer: Self-pay | Admitting: Internal Medicine

## 2023-04-04 NOTE — Progress Notes (Signed)
Cardiology Office Note  Date:  04/05/2023   ID:  Tracey MCKINZEY, DOB 04-05-1956, MRN [id number]  PCP:  Tracey Shams, MD   Chief Complaint  Patient presents with   Follow up Echo results.     Patient c/o shortness of breath & racing heart beats. Medications reviewed by the patient verbally.     HPI:  [name] is a very pleasant 67 year old woman with a  history of  PFO,  with  closure device in April 2008 performed in [place],  hyperlipidemia,  PAT versus paroxysmal atrial fibrillation on sotalol, hypertension,  Bell's palsy gout GERD Significant baseline stress, continues to care for her ex-husband who has underlying cognitive issues Myalgia rheumatica/rheumatoid arthritis who presents today for follow-up of her arrhythmia  Last seen by myself in clinic May 2024 Seen by one of our providers September 2024 On that visit reported shortness of breath, dizziness/lightheadedness, nausea without vomiting, peripheral edema, and episodes of tachycardia with activity or exertion. She also had an abnormal chest x-ray stating that her heart was enlarged   Echocardiogram October 2024, results reviewed on today's visit EF 55 to 60%, no valvular disease,   For RA and polymyalgia rheumatica, followed by rheumatology Previously been treated with methotrexate in the past and prednisone  They have recommend she consider Plaquenil Wants to be on plaquenil, has to stop sotolol Coming off prednisone  Total chol 234, LDL 153 On Zetia 10 mg daily Would prefer not to be on PCSK9 inhibitor  EKG personally reviewed by myself on todays visit EKG Interpretation Date/Time:  Tuesday April 05 2023 11:26:07 EDT Ventricular Rate:  70 PR Interval:  152 QRS Duration:  84 QT Interval:  438 QTC Calculation: 473 R Axis:   3  Text Interpretation: Normal sinus rhythm Normal ECG When compared with ECG of 13-Jan-2015 11:44, Nonspecific T wave abnormality, improved in Anterior leads Confirmed by  Tracey Wiley [id number]) on 04/05/2023 11:29:58 AM   Statin myalgias Tried crestor: Reports that she felt "horrible" Did not try repatha, purchase some but left them at room temperature by accident  She had a event monitor: reaction to adhesive 33 Supraventricular Tachycardia runs occurred, the run with the fastest interval lasting 5 beats with a max rate of 226 bpm,  the longest lasting 21.1 secs with an avg rate of 120 bpm.  Isolated SVEs were rare (<1.0%), SVE Couplets were rare (<1.0%), and SVE Triplets were rare (<1.0%). Isolated VEs were rare (<1.0%, 217), VE Couplets were rare (<1.0%, 26), and VE Triplets were rare (<1.0%, 2). Most patient triggered events (shortness of breath, dizziness/fluttering) were not associated with significant arrhythmia.  Labs reviewed Total chol 230 Off zocor, myalgias better,  Also had myalgias on Lipitor, crestor  Previously mentioned having a problem with her uterus, was scheduled for hysterectomy but this has been canceled in the setting of her inflammatory issue   lost her mother in February 2015.  History of gout in her wrists. Improvement with colchicine. Indomethacin upset her stomach  PMH:   has a past medical history of Adenomyosis, Anginal pain (HCC), Anxiety, Aortic atherosclerosis (HCC), Arthritis, Asthma, Cervicalgia, Chest pain, atypical, Complication of anesthesia, COVID (07/08/2022), Diastolic dysfunction, DVT (deep venous thrombosis) (HCC), Dyspnea, Esophageal stricture, Esophagitis, Facial paralysis/Bells palsy (10/29/2014), Family history of breast cancer, Family history of colon cancer, Family history of Lynch syndrome, Family history of stomach cancer, FHx: ovarian cancer, Fibroids, Fistula, labyrinthine (1994), Gastritis (11/30/2020), Gastroesophageal reflux disease, Generalized tonic-clonic seizure (HCC) (2002), 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  female (2000), Genital herpes, Gout, Headache, History of 2019 novel coronavirus disease (COVID-19)  (07/08/2022), History of hiatal hernia, History of pneumonia, Hyperlipidemia, Hypertension, Hypertrophy of breast, IBS (irritable bowel syndrome), Long-term corticosteroid use, Lumbago, Lumbar stenosis, Menopause, Meralgia paresthetica of right side, Obesity, Osteopenia, Ostium secundum type atrial septal defect, Panic attacks, PAT (paroxysmal atrial tachycardia) (HCC), PFO (patent foramen ovale), Polymyalgia rheumatica (HCC), PONV (postoperative nausea and vomiting), Post-concussion vertigo (1994), Postconcussion syndrome, Pre-diabetes, PTSD (post-traumatic stress disorder), Sleep difficulties, Spinal stenosis, lumbar region, without neurogenic claudication, Traumatic brain injury, closed (HCC) (1994), Vertigo, and Vitamin D deficiency.  PSH:    Past Surgical History:  Procedure Laterality Date   BREAST BIOPSY Right 2009   lymphoid and fibroadipose tissue   COLONOSCOPY  08-2014   DILATION AND CURETTAGE OF UTERUS     ESOPHAGOGASTRODUODENOSCOPY     HYSTEROSCOPY WITH D & C  01/20/2015   Procedure: DILATATION AND CURETTAGE /HYSTEROSCOPY;  Surgeon: Herold Harms, MD;  Location: [facility];  Service: Gynecology;;   HYSTEROSCOPY WITH D & C N/A 08/06/2022   Procedure: FRACTIONAL DILATATION AND CURETTAGE Melton Krebs;  Surgeon: Christeen Douglas, MD;  Location: [facility];  Service: Gynecology;  Laterality: N/A;   INNER EAR SURGERY     x 4   LAPAROSCOPY  01/20/2015   Procedure: LAPAROSCOPY DIAGNOSTIC;  Surgeon: Prentice Docker Defrancesco, MD;  Location: [facility];  Service: Gynecology;;   NASAL SEPTUM SURGERY     PATENT FORAMEN OVALE CLOSURE  April 2008   TMJ x 2     uterine ablation  March 2008    Current Outpatient Medications  Medication Sig Dispense Refill   acetaminophen (TYLENOL) 500 MG tablet Take 500 mg by mouth daily as needed for pain.     albuterol (VENTOLIN HFA) 108 (90 Base) MCG/ACT inhaler Inhale 2 puffs into the lungs every 6 (six) hours as needed for wheezing or shortness of breath. 1 each  1   amoxicillin (AMOXIL) 500 MG capsule TAKE 4 TABS 1 HOUR PRIOR TO DENTAL PROCEDURES 4 capsule 1   aspirin EC 81 MG tablet Take 81 mg by mouth daily.     Calcium-Magnesium-Vitamin D (CALCIUM 1200+D3 PO) Take 1 tablet by mouth daily.     colchicine 0.6 MG tablet Take 1 tablet (0.6 mg total) by mouth 2 (two) times daily as needed. 180 tablet 0   [employer] HYDROCORTISONE ANTI-ITCH 0.5 % APPLY TO THE AFFECTED AREA TWICE A DAY 57 g 11   esomeprazole (NEXIUM) 40 MG capsule Take 1 capsule (40 mg total) by mouth daily. 90 capsule 3   ezetimibe (ZETIA) 10 MG tablet TAKE 1 TABLET BY MOUTH EVERY DAY 90 tablet 2   famotidine (PEPCID) 20 MG tablet Take 1 tablet (20 mg total) by mouth at bedtime. (Patient taking differently: Take 20 mg by mouth daily.) 90 tablet 3   Flaxseed, Linseed, (FLAX SEED OIL) 1000 MG CAPS Take 1 capsule by mouth 2 (two) times daily.     furosemide (LASIX) 20 MG tablet Take 1 tablet (20 mg total) by mouth daily as needed. (Patient taking differently: Take 20 mg by mouth daily.) 90 tablet 3   magnesium gluconate (MAGONATE) 500 MG tablet Take 500 mg by mouth at bedtime.     MELATONIN PO Take 8 mg by mouth at bedtime.     Omega-3 Fatty Acids (OMEGA-3 FISH OIL PO) Take 1 capsule by mouth daily.     ondansetron (ZOFRAN ODT) 4 MG disintegrating tablet Take 1 tablet (4 mg total) by mouth  every 8 (eight) hours as needed for nausea or vomiting. 20 tablet 0   potassium chloride (KLOR-CON) 10 MEQ tablet Take 1 tablet (10 mEq total) by mouth daily as needed. (Patient taking differently: Take 10 mEq by mouth daily.) 90 tablet 3   predniSONE (DELTASONE) 1 MG tablet Take 1 mg by mouth daily with breakfast.     promethazine (PHENERGAN) 12.5 MG tablet Take 12.5 mg by mouth every 6 (six) hours as needed for nausea or vomiting.     sotalol (BETAPACE) 80 MG tablet TAKE 1 TABLET BY MOUTH TWICE A DAY 180 tablet 3   triamterene-hydrochlorothiazide (DYAZIDE) 37.5-25 MG capsule TAKE 1 CAPSULE BY MOUTH EVERY DAY 90  capsule 2   valACYclovir (VALTREX) 1000 MG tablet TAKE 1 TABLET BY MOUTH DAILY FOR SUPPRESSION , OR 5 TIMES DAILY FOR FLARE (Patient taking differently: Take 1,000 mg by mouth daily as needed.) 35 tablet 2   vitamin C (ASCORBIC ACID) 500 MG tablet Take 500 mg by mouth daily.     zinc gluconate 50 MG tablet Take 50 mg by mouth daily.     No current facility-administered medications for this visit.    Allergies:   2,4-d dimethylamine; Codeine; Hydrocodone-acetaminophen; Morphine; Nitrofurantoin monohyd macro; Ciprofloxacin; Erythromycin base; Hydrocodone; Oxycodone; Pamelor [nortriptyline hcl]; Stadol [butorphanol]; Talwin [pentazocine]; Topamax [topiramate]; Wellbutrin [bupropion]; Zanaflex [tizanidine hcl]; Lamictal [lamotrigine]; and Macrobid [nitrofurantoin macrocrystal]   Social History:  The patient  reports that she has never smoked. She has never used smokeless tobacco. She reports that she does not drink alcohol and does not use drugs.   Family History:   family history includes Breast cancer in her paternal aunt and paternal aunt; Cancer (age of onset: 21) in her mother; Colon cancer (age of onset: 9) in her sister; Heart failure in her father; Hyperlipidemia in her mother; Hypertension in her mother; Hypothyroidism in her mother; Kidney disease in her mother; Ovarian cancer in an other family member; Stomach cancer in her maternal grandmother.    Review of Systems: Review of Systems  Constitutional: Negative.   HENT: Negative.    Respiratory: Negative.    Cardiovascular:  Positive for chest pain.  Gastrointestinal: Negative.   Musculoskeletal: Negative.   Neurological: Negative.   Psychiatric/Behavioral: Negative.    All other systems reviewed and are negative.   PHYSICAL EXAM: VS:  BP 130/78 (BP Location: Left Arm, Patient Position: Sitting, Cuff Size: Normal)   Pulse 70   Ht 5\' 5"  (1.651 m)   Wt 226 lb (102.5 kg)   SpO2 98%   BMI 37.61 kg/m  , BMI Body mass index is  37.61 kg/m. Constitutional:  oriented to person, place, and time. No distress.  HENT:  Head: Grossly normal Eyes:  no discharge. No scleral icterus.  Neck: No JVD, no carotid bruits  Cardiovascular: Regular rate and rhythm, no murmurs appreciated Pulmonary/Chest: Clear to auscultation bilaterally, no wheezes or rails Abdominal: Soft.  no distension.  no tenderness.  Musculoskeletal: Normal range of motion Neurological:  normal muscle tone. Coordination normal. No atrophy Skin: Skin warm and dry Psychiatric: normal affect, pleasant  Recent Labs: 08/03/2022: Hemoglobin 13.4; Platelets 293 10/13/2022: ALT 15; BUN 14; Creatinine, Ser 0.82; Potassium 3.8; Sodium 138 10/25/2022: Pro B Natriuretic peptide (BNP) 111.0    Lipid Panel Lab Results  Component Value Date   CHOL 234 (H) 10/13/2022   HDL 48 10/13/2022   LDLCALC 153 (H) 10/13/2022   TRIG 165 (H) 10/13/2022     Wt Readings from Last 3  Encounters:  04/05/23 226 lb (102.5 kg)  03/16/23 226 lb 6 oz (102.7 kg)  02/18/23 227 lb 1.6 oz (103 kg)     ASSESSMENT AND PLAN:  SVT/ PSVT/ PAT  on sotalol 80 mg twice daily She will let us know when she needs to come off sotalol so she can start Plaquenil for rheumatologic disease Reports she is currently not ready Zio monitor could be ordered at the time of transition off sotalol  Chest pains prior cardiac CTA no significant disease Mild calcium near the ostium of the RCA No further ischemic workup needed  Mixed hyperlipidemia - Plan: EKG 12-Lead statin myalgia Continue Zetia, add bempedoic acid Prefers not to be on Repatha at this time  Polymyalgia rheumatica, RA Elevated sed rate and CRP, previously treated with methotrexate caused side effects Considering Plaquenil but needs to come off sotalol, she will let us know when she is ready to transition Would need to add alternate beta-blocker such as coreg/metoprolol or byslic  Obesity (BMI 30-39.9) - Plan: EKG 12-Lead Exercise  as tolerated  Bilateral lower extremity edema - Plan: EKG 12-Lead Lasix periodically for swelling  Leg pain Stable  Prolonged QT interval QTc is stable on today's visit Normal EKG     Orders Placed This Encounter  Procedures   EKG 12-Lead     Signed, Dossie Arbour, M.D., Ph.D. 04/05/2023  [facility] [place], [place] [phone]

## 2023-04-05 ENCOUNTER — Ambulatory Visit: Payer: Medicare HMO | Attending: Cardiovascular Disease | Admitting: Cardiovascular Disease

## 2023-04-05 ENCOUNTER — Encounter: Payer: Self-pay | Admitting: Cardiovascular Disease

## 2023-04-05 VITALS — BP 130/78 | HR 70 | Ht 65.0 in | Wt 226.0 lb

## 2023-04-05 DIAGNOSIS — T466X5D Adverse effect of antihyperlipidemic and antiarteriosclerotic drugs, subsequent encounter: Secondary | ICD-10-CM

## 2023-04-05 DIAGNOSIS — R0600 Dyspnea, unspecified: Secondary | ICD-10-CM

## 2023-04-05 DIAGNOSIS — E785 Hyperlipidemia, unspecified: Secondary | ICD-10-CM | POA: Diagnosis not present

## 2023-04-05 DIAGNOSIS — M069 Rheumatoid arthritis, unspecified: Secondary | ICD-10-CM

## 2023-04-05 DIAGNOSIS — R600 Localized edema: Secondary | ICD-10-CM

## 2023-04-05 DIAGNOSIS — I471 Supraventricular tachycardia, unspecified: Secondary | ICD-10-CM

## 2023-04-05 DIAGNOSIS — T466X5A Adverse effect of antihyperlipidemic and antiarteriosclerotic drugs, initial encounter: Secondary | ICD-10-CM

## 2023-04-05 DIAGNOSIS — I10 Essential (primary) hypertension: Secondary | ICD-10-CM

## 2023-04-05 DIAGNOSIS — M791 Myalgia, unspecified site: Secondary | ICD-10-CM

## 2023-04-05 DIAGNOSIS — M353 Polymyalgia rheumatica: Secondary | ICD-10-CM | POA: Diagnosis not present

## 2023-04-05 DIAGNOSIS — R7303 Prediabetes: Secondary | ICD-10-CM

## 2023-04-05 MED ORDER — NEXLETOL 180 MG PO TABS: 180.0000 mg | ORAL_TABLET | Freq: Every day | ORAL | Status: DC

## 2023-04-05 MED ORDER — NEXLETOL 180 MG PO TABS: 180.0000 mg | ORAL_TABLET | Freq: Every day | ORAL | 3 refills | Status: DC

## 2023-04-05 NOTE — Patient Instructions (Addendum)
Medication Instructions:  Please start bempidoic acid one a day Samples Stay on zetia  If you need a refill on your cardiac medications before your next appointment, please call your pharmacy.   Lab work: No new labs needed  Testing/Procedures: No new testing needed  Follow-Up: At [facility], you and your health needs are our priority.  As part of our continuing mission to provide you with exceptional heart care, we have created designated Provider Care Teams.  These Care Teams include your primary Cardiologist (physician) and Advanced Practice Providers (APPs -  Physician Assistants and Nurse Practitioners) who all work together to provide you with the care you need, when you need it.  You will need a follow up appointment in 6 months  Providers on your designated Care Team:   Nicolasa Ducking, NP Eula Listen, PA-C Cadence Fransico Michael, [place]  COVID-19 Vaccine Information can be found at: [email or web address] For questions related to vaccine distribution or appointments, please email [email or web address] or call [phone].
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
## 2023-04-07 ENCOUNTER — Ambulatory Visit (INDEPENDENT_AMBULATORY_CARE_PROVIDER_SITE_OTHER): Payer: Medicare HMO | Admitting: Clinical

## 2023-04-07 ENCOUNTER — Ambulatory Visit: Payer: Medicare HMO | Admitting: Internal Medicine

## 2023-04-07 ENCOUNTER — Encounter: Payer: Self-pay | Admitting: Internal Medicine

## 2023-04-07 VITALS — BP 118/66 | HR 64 | Temp 98.7°F | Ht 65.0 in | Wt 227.4 lb

## 2023-04-07 DIAGNOSIS — F4322 Adjustment disorder with anxiety: Secondary | ICD-10-CM

## 2023-04-07 DIAGNOSIS — R059 Cough, unspecified: Secondary | ICD-10-CM

## 2023-04-07 NOTE — Patient Instructions (Addendum)
My office will be contacting you by phone for referral to [facility]   - if you don't hear back from my office within one week please call us back or notify us thru MyChart and we'll address it right away.   6-8in bed blocks may help your morning cough   Follow up here is as needed

## 2023-04-07 NOTE — Progress Notes (Signed)
Time: 12:01 pm- 12:58 pm CPT Code: 53299M Diagnosis: F43.22  Tracey Wiley was seen in person for individual therapy. She presented as tearful, and reported that her other therapist, Felecia Jan, had strongly encouraged her to share that, after learning of the death by hanging suicide of a classmate from high school, [name] has been having recurring thoughts of hanging herself. These consist of considering how she might hang herself, including considering locations in her home. Suicidal intent was firmly denied, and she reported feeling distressed by the thoughts. She has tried taking walks when they arise, and this has been effective. She has told her partner, and he has hidden any means of hanging herself (his belts, a ladder). Therapist suggested exploring whether she may be experiencing intrusive thoughts related to an increase in stress in her life, and [name] agreed that this is possible. She described herself as having been diagnosed with OCD in the past, and has undergone exposure and response prevention in the 2010s. Therapist suggested looking into [email or web address] for a therapist that can guide her through exposure and response prevention for intrusive thoughts. Suicidal intent was denied, and she indicated that she is safe and will reach out if she needs to be seen prior to her next appointment. She is scheduled to be seen again in one week.  Treatment Plan Client Abilities/Strengths Shiona shared that she has participated in therapy in the past and has been very honest in session.  Client Treatment Preferences:  [name] prefers in person appointments. She prefers Tuesday and Thursday afternoon appointments. Client Statement of Needs  [name] is seeking validation of her emotional responses and overall self-concept. Treatment Level  Weekly   Symptoms Tearfulness, irritability Problems Addressed  Goals Mayli will reduce symptoms of depression and improve overall mood 1. [name] will increase comfort in social  situations Objective  Target Date: 01/08/2024 Frequency: Weekly  Progress: 0 Modality: Individual therapy  Objective  Target Date: 01/07/2024 Frequency: Weekly  Progress: 0 Modality: individual therapy  Related Interventions  [name] will have opportunities to process her experiences in session  Therapist will point out maladaptive thought and behavior patterns using CBT strategies. Therapist will incorporate behavior activation as appropriate. Therapist will incorporate gradual exposure therapy as appropriate. Therapist will provide referrals for additional resource as appropriate.  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

## 2023-04-08 ENCOUNTER — Encounter (INDEPENDENT_AMBULATORY_CARE_PROVIDER_SITE_OTHER): Payer: Self-pay | Admitting: Otolaryngology

## 2023-04-08 NOTE — Assessment & Plan Note (Addendum)
Onset 2012 - MRI sinus 10/28/17 neg  - 08/13/2021 max rx for gerd combined with rhinitis/sinusitis/ bronchitis coverage   - recurrent cough 09/02/21  - 10/08/2021 repeat pred taper same but this time augmentin x 21 days then sinus CT if not better and try off MTX >  Better while on augmentin/ prednisone - HRCT  11/24/21  The lungs are clear.  No interstitial lung disease or bronchiectasis.    The central airways are patent.  Expiratory scans show mild bilateral air trapping.  PFTS  11/24/21  wnl - Sinus CT  12/22/21  p augmentin  Trace mucosal thickening in the maxillary sinuses. Otherwise, clear sinuses with no layering fluid to suggest acute sinusitis. - 12/18/2021 added 1st gen H1 blockers per guidelines> could not tol  - 01/30/22 started with pred for presumed pmr > all resp symptoms improved  w/in a week  - ENT referral 04/07/2023 >>>   Her main complaints are all upper airway in nature and already saw allergist in [place] for same problems so not enthusiastic about repeating this now.  Would like her to see ENT next then and in meantime add bed blocks as prev rec to cover her for possible noct gerd in addition to continuing nexium 40 mg q am ac and pepcid 20 mg q pm   Since no evidence of RA lung dz suggested she monitor sats with exertion and let me know if declining and otherwise f/u here prn or at the request of rheum.         Each maintenance medication was reviewed in detail including emphasizing most importantly the difference between maintenance and prns and under what circumstances the prns are to be triggered using an action plan format where appropriate.  Total time for H and P, chart review, counseling,   and generating customized AVS unique to this office visit / same day charting = 25 min

## 2023-04-12 ENCOUNTER — Other Ambulatory Visit (HOSPITAL_COMMUNITY): Payer: Self-pay

## 2023-04-12 ENCOUNTER — Telehealth: Payer: Self-pay

## 2023-04-12 NOTE — Telephone Encounter (Signed)
Pharmacy Patient Advocate Encounter   Received notification from CoverMyMeds that prior authorization for NEXLETOL is required/requested.   Insurance verification completed.   The patient is insured through [facility] .   Per test claim: PA required; PA submitted to above mentioned insurance via CoverMyMeds Key/confirmation #/EOC [id number] Status is pending

## 2023-04-13 ENCOUNTER — Other Ambulatory Visit (HOSPITAL_COMMUNITY): Payer: Self-pay

## 2023-04-13 ENCOUNTER — Ambulatory Visit (INDEPENDENT_AMBULATORY_CARE_PROVIDER_SITE_OTHER): Payer: Medicare HMO

## 2023-04-13 DIAGNOSIS — Z23 Encounter for immunization: Secondary | ICD-10-CM | POA: Diagnosis not present

## 2023-04-13 NOTE — Telephone Encounter (Signed)
Pharmacy Patient Advocate Encounter  Received notification from [facility] that Prior Authorization for NEXLETOL has been APPROVED from 04/12/23 to 10/10/23

## 2023-04-13 NOTE — Progress Notes (Signed)
Patient presented for High Dose Flu vaccine to left deltoid, patient voiced no concerns nor showed any signs of distress during injection

## 2023-04-14 ENCOUNTER — Ambulatory Visit (INDEPENDENT_AMBULATORY_CARE_PROVIDER_SITE_OTHER): Payer: Medicare HMO | Admitting: Clinical

## 2023-04-14 DIAGNOSIS — F4322 Adjustment disorder with anxiety: Secondary | ICD-10-CM

## 2023-04-14 NOTE — Progress Notes (Addendum)
Time: 12:01 pm- 12:58 pm CPT Code: 16109U Diagnosis: F43.22  Wrenn was seen remotely using secure video conferencing. She was in her home and the therapist was in her office at the time of the appointment. Client is aware of risks of telehealth and consented to a virtual visit. She reflected upon challenges that had arisen in her relationships in the past week, and therapist suggested communication strategies and boundary setting. Suicidal ideation and intent were denied, and she had signed an authorization for the therapist to consult with her other therapist. She is scheduled to be seen again in one week.  04/25/2023 Therapist reached out to Felecia Jan via phone to consult. Therapist engaged in a 30 minute consult with Felecia Jan in order to explore for any conflict in therapeutic recommendations. Therapist and Inetta Fermo discussed case conceptualization and recommendations, and broadly agreed on case conceptualization, as well as the possibility of a need for more targeted trauma or exposure and response prevention services in the future. A plan was created to continue consulting and reach out if any conflict was suspected.   Treatment Plan Client Abilities/Strengths Raynell shared that she has participated in therapy in the past and has been very honest in session.  Client Treatment Preferences:  Jacee prefers in person appointments. She prefers Tuesday and Thursday afternoon appointments. Client Statement of Needs  Mie is seeking validation of her emotional responses and overall self-concept. Treatment Level  Weekly   Symptoms Tearfulness, irritability Problems Addressed  Goals Kayani will reduce symptoms of depression and improve overall mood 1. Jaxi will increase comfort in social situations Objective  Target Date: 01/08/2024 Frequency: Weekly  Progress: 0 Modality: Individual therapy  Objective  Target Date: 01/07/2024 Frequency: Weekly  Progress: 0 Modality: individual therapy  Related  Interventions  Savahanna will have opportunities to process her experiences in session  Therapist will point out maladaptive thought and behavior patterns using CBT strategies. Therapist will incorporate behavior activation as appropriate. Therapist will incorporate gradual exposure therapy as appropriate. Therapist will provide referrals for additional resource as appropriate.  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

## 2023-04-20 ENCOUNTER — Encounter: Payer: Self-pay | Admitting: Internal Medicine

## 2023-04-20 LAB — HM DEXA SCAN: HM Dexa Scan: NORMAL

## 2023-04-20 LAB — HM MAMMOGRAPHY

## 2023-04-20 NOTE — Telephone Encounter (Signed)
Error

## 2023-04-21 ENCOUNTER — Encounter: Payer: Self-pay | Admitting: Internal Medicine

## 2023-04-21 ENCOUNTER — Ambulatory Visit: Payer: Medicare HMO | Admitting: Clinical

## 2023-04-28 ENCOUNTER — Ambulatory Visit (INDEPENDENT_AMBULATORY_CARE_PROVIDER_SITE_OTHER): Payer: Medicare HMO | Admitting: Clinical

## 2023-04-28 ENCOUNTER — Ambulatory Visit: Payer: Medicare HMO | Admitting: Clinical

## 2023-04-28 DIAGNOSIS — F4322 Adjustment disorder with anxiety: Secondary | ICD-10-CM

## 2023-04-28 NOTE — Progress Notes (Signed)
Time: 12:01 pm- 12:58 pm CPT Code: 09811B Diagnosis: F43.22  Bayley was seen remotely using secure video conferencing. She was in her home and the therapist was in her office at the time of the appointment. Client is aware of risks of telehealth and consented to a virtual visit. Session focused on ongoing stress related to caring for her ex-husband, who has dementia. Tameka had attempted to set boundaries, and queried whether she had been effective. Therapist reflected the unmanageable nature of the situation, and encouraged Maylin to access resources that had been provided by Felecia Jan. She is scheduled to be seen again in two weeks.  Treatment Plan Client Abilities/Strengths Shaherah shared that she has participated in therapy in the past and has been very honest in session.  Client Treatment Preferences:  Myrna prefers in person appointments. She prefers Tuesday and Thursday afternoon appointments. Client Statement of Needs  Zahriya is seeking validation of her emotional responses and overall self-concept. Treatment Level  Weekly   Symptoms Tearfulness, irritability Problems Addressed  Goals Dreama will reduce symptoms of depression and improve overall mood 1. [place] will increase comfort in social situations Objective  Target Date: 01/08/2024 Frequency: Weekly  Progress: 0 Modality: Individual therapy  Objective  Target Date: 01/07/2024 Frequency: Weekly  Progress: 0 Modality: individual therapy  Related Interventions  Biancca will have opportunities to process her experiences in session  Therapist will point out maladaptive thought and behavior patterns using CBT strategies. Therapist will incorporate behavior activation as appropriate. Therapist will incorporate gradual exposure therapy as appropriate. Therapist will provide referrals for additional resource as appropriate.  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

## 2023-05-12 ENCOUNTER — Ambulatory Visit (INDEPENDENT_AMBULATORY_CARE_PROVIDER_SITE_OTHER): Payer: Medicare HMO | Admitting: Clinical

## 2023-05-12 DIAGNOSIS — F4322 Adjustment disorder with anxiety: Secondary | ICD-10-CM | POA: Diagnosis not present

## 2023-05-12 NOTE — Progress Notes (Signed)
Time: 12:01 pm- 12:58 pm CPT Code: 41324M Diagnosis: F43.22  Tracey Wiley was seen remotely using secure video conferencing. She was in her home and the therapist was in her office at the time of the appointment. Client is aware of risks of telehealth and consented to a virtual visit. Session focused on family dynamics over thanksgiving. Therapist encouraged her to consider whether she may want to have a conversation with certain family members, and to consider her goals if she chooses to have one. She is scheduled to be seen again in one week.  Treatment Plan Client Abilities/Strengths Tracey Wiley shared that she has participated in therapy in the past and has been very honest in session.  Client Treatment Preferences:  Tracey Wiley prefers in person appointments. She prefers Tuesday and Thursday afternoon appointments. Client Statement of Needs  Tracey Wiley is seeking validation of her emotional responses and overall self-concept. Treatment Level  Weekly   Symptoms Tearfulness, irritability Problems Addressed  Goals Tracey Wiley will reduce symptoms of depression and improve overall mood 1. Tracey Wiley will increase comfort in social situations Objective  Target Date: 01/08/2024 Frequency: Weekly  Progress: 0 Modality: Individual therapy  Objective  Target Date: 01/07/2024 Frequency: Weekly  Progress: 0 Modality: individual therapy  Related Interventions  Tracey Wiley will have opportunities to process her experiences in session  Therapist will point out maladaptive thought and behavior patterns using CBT strategies. Therapist will incorporate behavior activation as appropriate. Therapist will incorporate gradual exposure therapy as appropriate. Therapist will provide referrals for additional resource as appropriate.  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

## 2023-05-17 ENCOUNTER — Encounter: Payer: Self-pay | Admitting: Internal Medicine

## 2023-05-17 ENCOUNTER — Ambulatory Visit: Payer: Medicare HMO | Admitting: Internal Medicine

## 2023-05-17 VITALS — BP 124/76 | HR 75 | Ht 65.0 in | Wt 225.2 lb

## 2023-05-17 DIAGNOSIS — R590 Localized enlarged lymph nodes: Secondary | ICD-10-CM | POA: Diagnosis not present

## 2023-05-17 DIAGNOSIS — R7303 Prediabetes: Secondary | ICD-10-CM

## 2023-05-17 DIAGNOSIS — M79651 Pain in right thigh: Secondary | ICD-10-CM | POA: Diagnosis not present

## 2023-05-17 DIAGNOSIS — M25542 Pain in joints of left hand: Secondary | ICD-10-CM

## 2023-05-17 DIAGNOSIS — R52 Pain, unspecified: Secondary | ICD-10-CM

## 2023-05-17 DIAGNOSIS — M79652 Pain in left thigh: Secondary | ICD-10-CM | POA: Diagnosis not present

## 2023-05-17 DIAGNOSIS — M25541 Pain in joints of right hand: Secondary | ICD-10-CM

## 2023-05-17 DIAGNOSIS — M353 Polymyalgia rheumatica: Secondary | ICD-10-CM

## 2023-05-17 DIAGNOSIS — L650 Telogen effluvium: Secondary | ICD-10-CM

## 2023-05-17 LAB — CBC WITH DIFFERENTIAL/PLATELET
Basophils Absolute: 0 10*3/uL (ref 0.0–0.1)
Basophils Relative: 0.6 % (ref 0.0–3.0)
Eosinophils Absolute: 0.4 10*3/uL (ref 0.0–0.7)
Eosinophils Relative: 4.7 % (ref 0.0–5.0)
HCT: 38.2 % (ref 36.0–46.0)
Hemoglobin: 12.5 g/dL (ref 12.0–15.0)
Lymphocytes Relative: 26.7 % (ref 12.0–46.0)
Lymphs Abs: 2.1 10*3/uL (ref 0.7–4.0)
MCHC: 32.8 g/dL (ref 30.0–36.0)
MCV: 82.8 fL (ref 78.0–100.0)
Monocytes Absolute: 0.6 10*3/uL (ref 0.1–1.0)
Monocytes Relative: 7.9 % (ref 3.0–12.0)
Neutro Abs: 4.8 10*3/uL (ref 1.4–7.7)
Neutrophils Relative %: 60.1 % (ref 43.0–77.0)
Platelets: 339 10*3/uL (ref 150.0–400.0)
RBC: 4.62 Mil/uL (ref 3.87–5.11)
RDW: 15.3 % (ref 11.5–15.5)
WBC: 8 10*3/uL (ref 4.0–10.5)

## 2023-05-17 LAB — TSH: TSH: 2.52 u[IU]/mL (ref 0.35–5.50)

## 2023-05-17 LAB — CK: Total CK: 78 U/L (ref 7–177)

## 2023-05-17 LAB — C-REACTIVE PROTEIN: CRP: 2.4 mg/dL (ref 0.5–20.0)

## 2023-05-17 LAB — SEDIMENTATION RATE: Sed Rate: 31 mm/h — ABNORMAL HIGH (ref 0–30)

## 2023-05-17 NOTE — Progress Notes (Signed)
Subjective:  Patient ID: Tracey Wiley, female    DOB: [birth year]  Age: 67 y.o. MRN: [id number]  CC: The primary encounter diagnosis was Bilateral thigh pain. Diagnoses of Prediabetes, Generalized body aches, Telogen effluvium, Lymphadenopathy, axillary, Joint pain in both hands, and Polymyalgia rheumatica (HCC) were also pertinent to this visit.   HPI Tracey Wiley presents for  Chief Complaint  Patient presents with   Medical Management of Chronic Issues   1) joint pain:  currently off of prednisone since  NOv 15  and is  following  [facility] follow up in October.  Joint pain is no longer bothering her. Except for all joints of hands,  left hadn >> right.  Currently reports the gradual onset of  muscle pain involving the anterior  Thigh muscles, which she first noticed when her prednisone  was tapered down to  5 mg daily   .  Thigh muscles  bothering her a lot ("12/10" with attempts to stand up)   eventually resolves with prolonged standing .   Recurs with even 15 minutes of sitting.  Using tylenol  and motrin  , but the following day has "lightening bolt" pain in the lateral thigh of the right thigh.  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  The muscle pain was discussed with both Smoak and Apte ([facility]) but no additional labs were done or muscle biopsy . Smoak attributed her pain to hip joint and encouraged her to go to PT for the hip which had been previously ordered.    She notes that her inflammatory markers and her  joint/muscle pain  improves whenever she takes an antibiotic .   She had an episode of left hand pain that lasted from November 19-21 ; did not resolve with NSAIDs,  hand swelled on Nov 22  so she took some leftover cephalexin 500 mg tid and by Nov 23 the hand swelling and pain were both gone. She took 2 cephalexin daily fo rthe next 48 hours and symptoms did not return  .  Discussed   ID consult   2) during preoperative workup for endometrial biopsy she had a conversation with an RN whose sister had similar symptoms and was diagnosed with a rare thyroid disorder , thyroid cancer and "blood cancer"    3) adjustment disorder with anxiety:;  improving with counselling   4) SVT:  saw Cardiolology (gollan)  : still on sotalol,  plans to start plaquenil on hold,    5) breast asymmetrry:  during her mammogram on Nov 13,  she noted that her  left breast had become noticeably larger than her right for the past year. The asymmetry was Confirmed by the tech but not mentioned on her [facility] mammogram  which was normal (in chart) she notes that she has episodes of recurrent tender swelling of the left axillary lymph nodes.    Outpatient Medications Prior to Visit  Medication Sig Dispense Refill   acetaminophen (TYLENOL) 500 MG tablet Take 500 mg by mouth daily as needed for pain.     albuterol (VENTOLIN HFA) 108 (90 Base) MCG/ACT inhaler Inhale 2 puffs into the lungs every 6 (six) hours as needed for wheezing or shortness of breath. 1 each 1   amoxicillin (AMOXIL) 500 MG capsule TAKE 4 TABS 1 HOUR PRIOR TO DENTAL PROCEDURES 4 capsule 1   aspirin  EC 81 MG tablet Take 81 mg by mouth daily.     cholecalciferol (VITAMIN D3) 25 MCG (1000 UNIT) tablet Take 1,000 Units by mouth daily.     colchicine 0.6 MG tablet Take 1 tablet (0.6 mg total) by mouth 2 (two) times daily as needed. 180 tablet 0   [facility] HYDROCORTISONE ANTI-ITCH 0.5 % APPLY TO THE AFFECTED AREA TWICE A DAY 57 g 11   esomeprazole (NEXIUM) 40 MG capsule Take 1 capsule (40 mg total) by mouth daily. 90 capsule 3   ezetimibe (ZETIA) 10 MG tablet TAKE 1 TABLET BY MOUTH EVERY DAY 90 tablet 2   famotidine (PEPCID) 20 MG tablet Take 1 tablet (20 mg total) by mouth at bedtime. (Patient taking differently: Take 20 mg by mouth daily.) 90 tablet 3   Flaxseed, Linseed, (FLAX SEED OIL) 1000 MG CAPS Take 1 capsule by mouth 2 (two) times daily.      furosemide (LASIX) 20 MG tablet Take 1 tablet (20 mg total) by mouth daily as needed. (Patient taking differently: Take 20 mg by mouth daily.) 90 tablet 3   magnesium gluconate (MAGONATE) 500 MG tablet Take 500 mg by mouth at bedtime.     MELATONIN PO Take 8 mg by mouth at bedtime.     Omega-3 Fatty Acids (OMEGA-3 FISH OIL PO) Take 1 capsule by mouth daily.     ondansetron (ZOFRAN ODT) 4 MG disintegrating tablet Take 1 tablet (4 mg total) by mouth every 8 (eight) hours as needed for nausea or vomiting. 20 tablet 0   potassium chloride (KLOR-CON) 10 MEQ tablet Take 1 tablet (10 mEq total) by mouth daily as needed. (Patient taking differently: Take 10 mEq by mouth daily.) 90 tablet 3   promethazine (PHENERGAN) 12.5 MG tablet Take 12.5 mg by mouth every 6 (six) hours as needed for nausea or vomiting.     sotalol (BETAPACE) 80 MG tablet TAKE 1 TABLET BY MOUTH TWICE A DAY 180 tablet 3   triamterene-hydrochlorothiazide (DYAZIDE) 37.5-25 MG capsule TAKE 1 CAPSULE BY MOUTH EVERY DAY 90 capsule 2   valACYclovir (VALTREX) 1000 MG tablet TAKE 1 TABLET BY MOUTH DAILY FOR SUPPRESSION , OR 5 TIMES DAILY FOR FLARE (Patient taking differently: Take 1,000 mg by mouth daily as needed.) 35 tablet 2   vitamin C (ASCORBIC ACID) 500 MG tablet Take 1,000 mg by mouth daily.     zinc gluconate 50 MG tablet Take 50 mg by mouth daily.     Bempedoic Acid (NEXLETOL) 180 MG TABS Take 1 tablet (180 mg total) by mouth daily. (Patient not taking: Reported on 05/17/2023) 90 tablet 3   Calcium-Magnesium-Vitamin D (CALCIUM 1200+D3 PO) Take 1 tablet by mouth daily.     predniSONE (DELTASONE) 1 MG tablet Take 1 mg by mouth daily with breakfast. (Patient not taking: Reported on 05/17/2023)     No facility-administered medications prior to visit.    Review of Systems;  Patient denies headache, fevers, malaise, unintentional weight loss, skin rash, eye pain, sinus congestion and sinus pain, sore throat, dysphagia,  hemoptysis , cough,  dyspnea, wheezing, chest pain, palpitations, orthopnea, edema, abdominal pain, nausea, melena, diarrhea, constipation, flank pain, dysuria, hematuria, urinary  Frequency, nocturia, numbness, tingling, seizures,  Focal weakness, Loss of consciousness,  Tremor, insomnia, depression, anxiety, and suicidal ideation.      Objective:  BP 124/76   Pulse 75   Ht 5\' 5"  (1.651 m)   Wt 225 lb 3.2 oz (102.2 kg)   SpO2 98%  BMI 37.48 kg/m   BP Readings from Last 3 Encounters:  05/17/23 124/76  04/07/23 118/66  04/05/23 130/78    Wt Readings from Last 3 Encounters:  05/17/23 225 lb 3.2 oz (102.2 kg)  04/07/23 227 lb 6.4 oz (103.1 kg)  04/05/23 226 lb (102.5 kg)    Physical Exam Vitals reviewed.  Constitutional:      General: She is not in acute distress.    Appearance: Normal appearance. She is normal weight. She is not ill-appearing, toxic-appearing or diaphoretic.  HENT:     Head: Normocephalic.  Eyes:     General: No scleral icterus.       Right eye: No discharge.        Left eye: No discharge.     Conjunctiva/sclera: Conjunctivae normal.  Cardiovascular:     Rate and Rhythm: Normal rate and regular rhythm.     Heart sounds: Normal heart sounds.  Pulmonary:     Effort: Pulmonary effort is normal. No respiratory distress.     Breath sounds: Normal breath sounds.  Musculoskeletal:        General: Normal range of motion.  Skin:    General: Skin is warm and dry.  Neurological:     General: No focal deficit present.     Mental Status: She is alert and oriented to person, place, and time. Mental status is at baseline.  Psychiatric:        Mood and Affect: Mood normal.        Behavior: Behavior normal.        Thought Content: Thought content normal.        Judgment: Judgment normal.    Lab Results  Component Value Date   HGBA1C 6.2 (H) 10/13/2022   HGBA1C 5.8 (H) 12/22/2021   HGBA1C 6.0 (H) 08/18/2020    Lab Results  Component Value Date   CREATININE 0.82 10/13/2022    CREATININE 0.96 03/12/2022   CREATININE 0.71 12/04/2021    Lab Results  Component Value Date   WBC 13.0 (H) 08/03/2022   HGB 13.4 08/03/2022   HCT 40.9 08/03/2022   PLT 293 08/03/2022   GLUCOSE 109 (H) 10/13/2022   CHOL 234 (H) 10/13/2022   TRIG 165 (H) 10/13/2022   HDL 48 10/13/2022   LDLDIRECT 159 (H) 10/13/2022   LDLCALC 153 (H) 10/13/2022   ALT 15 10/13/2022   AST 21 10/13/2022   NA 138 10/13/2022   K 3.8 10/13/2022   CL 101 10/13/2022   CREATININE 0.82 10/13/2022   BUN 14 10/13/2022   CO2 29 10/13/2022   TSH 2.745 03/20/2020   HGBA1C 6.2 (H) 10/13/2022   MICROALBUR <0.7 10/25/2022    No results found.  Assessment & Plan:  .Bilateral thigh pain -     CK -     Sedimentation rate -     C-reactive protein  Prediabetes  Generalized body aches Assessment & Plan: She notes that her episode of BODY ACHES, JOINT PAI N AND lymphadenopathy resolve when she is coincidentally TREATED WITH antibiotics.   Referring to ID to rule out occult  chronic infection   Orders: -     CBC with Differential/Platelet -     IgA  Telogen effluvium -     TSH  Lymphadenopathy, axillary  Joint pain in both hands -     Ambulatory referral to Infectious Disease  Polymyalgia rheumatica (HCC) Assessment & Plan: Appears to have been ruled out by rheumatology.       I  provided 30 minutes of face-to-face time during this encounter reviewing patient's last visit with me, patient's  most recent visit with rheumatology,  previous  labs and imaging studies, counseling on currently addressed issues,  and post visit ordering to diagnostics and therapeutics .   Follow-up: No follow-ups on file.   Sherlene Shams, MD

## 2023-05-17 NOTE — Patient Instructions (Signed)
I am going to refer  you to infectious disease and hematology  for their opinions once I see the labs from today   Please contact me the next time you have lymph node swelling or hand swelling so I can order labs and an ultrasound

## 2023-05-17 NOTE — Assessment & Plan Note (Signed)
She notes that her episode of BODY ACHES, JOINT PAI N AND lymphadenopathy resolve when she is coincidentally TREATED WITH antibiotics.   Referring to ID to rule out occult  chronic infection

## 2023-05-17 NOTE — Assessment & Plan Note (Signed)
Appears to have been ruled out by rheumatology.

## 2023-05-18 LAB — IGA: Immunoglobulin A: 229 mg/dL (ref 70–320)

## 2023-05-19 ENCOUNTER — Ambulatory Visit (INDEPENDENT_AMBULATORY_CARE_PROVIDER_SITE_OTHER): Payer: Medicare HMO | Admitting: Clinical

## 2023-05-19 ENCOUNTER — Ambulatory Visit: Payer: Medicare HMO | Admitting: Clinical

## 2023-05-19 DIAGNOSIS — F4322 Adjustment disorder with anxiety: Secondary | ICD-10-CM | POA: Diagnosis not present

## 2023-05-19 NOTE — Progress Notes (Signed)
Time: 12:01 pm- 12:58 pm CPT Code: 16109U-04 Diagnosis: F43.22  Lillah was seen remotely using secure video conferencing. She was in her home and the therapist was in her office at the time of the appointment. Client is aware of risks of telehealth and consented to a virtual visit. Session focused on observations from therapists involved in her ex-husband's care that he may exaggerate symptoms to manipulate her. Session included implications of this revelation for Liah's own boundaries and consideration of his care. She is scheduled to be seen again in one week.  Treatment Plan Client Abilities/Strengths Aryss shared that she has participated in therapy in the past and has been very honest in session.  Client Treatment Preferences:  Deshana prefers in person appointments. She prefers Tuesday and Thursday afternoon appointments. Client Statement of Needs  Linette is seeking validation of her emotional responses and overall self-concept. Treatment Level  Weekly   Symptoms Tearfulness, irritability Problems Addressed  Goals Tayra will reduce symptoms of depression and improve overall mood 1. Azyah will increase comfort in social situations Objective  Target Date: 01/08/2024 Frequency: Weekly  Progress: 0 Modality: Individual therapy  Objective  Target Date: 01/07/2024 Frequency: Weekly  Progress: 0 Modality: individual therapy  Related Interventions  Claressa will have opportunities to process her experiences in session  Therapist will point out maladaptive thought and behavior patterns using CBT strategies. Therapist will incorporate behavior activation as appropriate. Therapist will incorporate gradual exposure therapy as appropriate. Therapist will provide referrals for additional resource as appropriate.  Diagnosis Axis none Adjustment Disorder with Anxiety (F43.22)   Axis none    Conditions For Discharge Achievement of treatment goals and objectives           [name],  PhD               [name], PhD

## 2023-05-26 ENCOUNTER — Ambulatory Visit: Payer: Medicare HMO | Admitting: Clinical

## 2023-05-26 DIAGNOSIS — F4322 Adjustment disorder with anxiety: Secondary | ICD-10-CM

## 2023-05-26 NOTE — Progress Notes (Signed)
Time: 12:01 pm- 12:58 pm CPT Code: 16109U Diagnosis: F43.22  Darra was seen in person for individual therapy. Session focused on recent conflicts and concerns that had arisen in her relationship. Therapist provided an opportunity to process recent experiences, encouraging her to explore alternate perspective and challenges various thoughts. She is scheduled to be seen again in three weeks.   Treatment Plan Client Abilities/Strengths Dorthe shared that she has participated in therapy in the past and has been very honest in session.  Client Treatment Preferences:  Tylene prefers in person appointments. She prefers Tuesday and Thursday afternoon appointments. Client Statement of Needs  Arleni is seeking validation of her emotional responses and overall self-concept. Treatment Level  Weekly   Symptoms Tearfulness, irritability Problems Addressed  Goals Joseline will reduce symptoms of depression and improve overall mood 1. Anysa will increase comfort in social situations Objective  Target Date: 01/08/2024 Frequency: Weekly  Progress: 0 Modality: Individual therapy  Objective  Target Date: 01/07/2024 Frequency: Weekly  Progress: 0 Modality: individual therapy  Related Interventions  Makaelyn will have opportunities to process her experiences in session  Therapist will point out maladaptive thought and behavior patterns using CBT strategies. Therapist will incorporate behavior activation as appropriate. Therapist will incorporate gradual exposure therapy as appropriate. Therapist will provide referrals for additional resource as appropriate.  Diagnosis Axis none Adjustment Disorder with Anxiety (F43.22)   Axis none    Conditions For Discharge Achievement of treatment goals and objectives              [name], PhD               [name], PhD

## 2023-06-02 ENCOUNTER — Encounter: Payer: Self-pay | Admitting: Internal Medicine

## 2023-06-02 ENCOUNTER — Telehealth: Payer: Self-pay | Admitting: Internal Medicine

## 2023-06-02 DIAGNOSIS — R590 Localized enlarged lymph nodes: Secondary | ICD-10-CM

## 2023-06-02 NOTE — Telephone Encounter (Signed)
Copied from CRM [id number]. Topic: Referral - Question >> Jun 02, 2023  3:03 PM Gurney Maxin H wrote: Reason for CRM: Patient called stating she has an appointment for Infectious Diseases from referral from Dr. Darrick Huntsman, patient states facility [facility] called her asking questions as to why she's coming in and told patient they would have to speak to provider to evaluate if they can see her for the joint pain based off referral. Patient isn't happy with facility and would like to speak with Shanda Bumps, please reach out to patient.  Lisamarie [phone]

## 2023-06-02 NOTE — Telephone Encounter (Signed)
Spoke with pt and she wanted to give Dr Darrick Huntsman a heads up incase the infectious disease doctor calls to ask moe questions in regards to the referral. Pt stated that when she called to find out if her appt was in [place] or [place] she was questioned about why she was being seen. Pt explained why and they stated that Dr. Darrick Huntsman put on the referral joint pain and they don't see people for joint pain. They allowed pt to stay schedule but stated that her PCP and infectious disease doctor may want to speak before the appt.

## 2023-06-04 ENCOUNTER — Other Ambulatory Visit: Payer: Self-pay | Admitting: Internal Medicine

## 2023-06-04 DIAGNOSIS — R7303 Prediabetes: Secondary | ICD-10-CM

## 2023-06-09 ENCOUNTER — Telehealth: Payer: Self-pay | Admitting: *Deleted

## 2023-06-09 NOTE — Telephone Encounter (Signed)
 My Chart message sent

## 2023-06-09 NOTE — Telephone Encounter (Signed)
 Copied from CRM [id number]. Topic: Clinical - Medical Advice >> Jun 09, 2023  3:55 PM Melissa C wrote: Reason for CRM: patient returning call to Dr. Marylynn, asking that she please call her back when she gets a chance. Note: please call HOME number as patient does not get cell service at home. Thank you!

## 2023-06-10 ENCOUNTER — Other Ambulatory Visit (INDEPENDENT_AMBULATORY_CARE_PROVIDER_SITE_OTHER): Payer: Medicare HMO

## 2023-06-10 DIAGNOSIS — R590 Localized enlarged lymph nodes: Secondary | ICD-10-CM | POA: Diagnosis not present

## 2023-06-10 NOTE — Telephone Encounter (Signed)
Pt has seen and responded to mychart message.

## 2023-06-13 ENCOUNTER — Telehealth: Payer: Self-pay

## 2023-06-13 NOTE — Telephone Encounter (Signed)
 Copied from CRM [id number]. Topic: Clinical - Medical Advice >> Jun 13, 2023 10:43 AM Leila C wrote: Reason for CRM: Received message in MyChart about labs patient had done, 05/17/23 test was not done and 06/10/23 extra vial was submitted but no test information/no label given. Hemoglobin A1c is missing from what patient is looking at in MyChart. Patient is concerned with these error and what is going on, and also please contact the lab to figure this out. Please call patient back [phone] leave a message if unavailable or via MyChart.

## 2023-06-14 ENCOUNTER — Ambulatory Visit: Payer: Medicare HMO | Admitting: Infectious Diseases

## 2023-06-14 ENCOUNTER — Encounter: Payer: Self-pay | Admitting: Internal Medicine

## 2023-06-14 DIAGNOSIS — R7303 Prediabetes: Secondary | ICD-10-CM

## 2023-06-14 NOTE — Telephone Encounter (Signed)
 Looks like the A1c was ordered future and that is why it was done drawn when she was here for labs.

## 2023-06-14 NOTE — Telephone Encounter (Signed)
 I have called [facility] to see if they can add on the A1C. We should here something by tomorrow. They may also fax over a from to add on text. Please keep an eye on the e-fax folder as well. thanks

## 2023-06-14 NOTE — Telephone Encounter (Signed)
 Waiting on a form to be faxed over for add on.

## 2023-06-14 NOTE — Telephone Encounter (Signed)
 Can an A1c bee added to the blood that was drawn last?

## 2023-06-14 NOTE — Telephone Encounter (Signed)
 I have reordered the A1c that the lab did not draw despit eit bein gordered a month ago

## 2023-06-15 ENCOUNTER — Ambulatory Visit: Payer: Medicare HMO | Admitting: Clinical

## 2023-06-15 DIAGNOSIS — F4322 Adjustment disorder with anxiety: Secondary | ICD-10-CM | POA: Diagnosis not present

## 2023-06-15 LAB — TEST AUTHORIZATION

## 2023-06-15 LAB — HEMOGLOBIN A1C
Hgb A1c MFr Bld: 6.1 %{Hb} — ABNORMAL HIGH (ref <5.7)
Mean Plasma Glucose: 128 mg/dL
eAG (mmol/L): 7.1 mmol/L

## 2023-06-15 LAB — HUMAN PARVOVIRUS DNA DETECTION BY PCR: PARVOVIRUS B19 DNA,QL REAL TIME PCR: NOT DETECTED

## 2023-06-15 LAB — HIV ANTIBODY (ROUTINE TESTING W REFLEX): HIV 1&2 Ab, 4th Generation: NONREACTIVE

## 2023-06-15 LAB — EXTRA SPECIMEN

## 2023-06-15 NOTE — Progress Notes (Signed)
 Time: 2:01 pm- 2:58 pm CPT Code: 09162E Diagnosis: F43.22  Tracey Wiley was seen in person for individual therapy. Session focused on feelings of hurt and disappointment related to her relationship with her nephew, and events that had transpired around the holidays. Therapist encouraged her to consider her hopes for the future of the relationship, and allow her emotions to settle prior to communicating. Therapist also suggested writing out what she might say without sending it, to read back and evaluated whether she actually wants to say it. She is scheduled to be seen again in one week.   Treatment Plan Client Abilities/Strengths Tracey Wiley shared that she has participated in therapy in the past and has been very honest in session.  Client Treatment Preferences:  Tracey Wiley prefers in person appointments. She prefers Tuesday and Thursday afternoon appointments. Client Statement of Needs  Tracey Wiley is seeking validation of her emotional responses and overall self-concept. Treatment Level  Weekly   Symptoms Tearfulness, irritability Problems Addressed  Goals Tracey Wiley will reduce symptoms of depression and improve overall mood 1. Tracey Wiley will increase comfort in social situations Objective  Target Date: 01/08/2024 Frequency: Weekly  Progress: 0 Modality: Individual therapy  Objective  Target Date: 01/07/2024 Frequency: Weekly  Progress: 0 Modality: individual therapy  Related Interventions  Tracey Wiley will have opportunities to process her experiences in session  Therapist will point out maladaptive thought and behavior patterns using CBT strategies. Therapist will incorporate behavior activation as appropriate. Therapist will incorporate gradual exposure therapy as appropriate. Therapist will provide referrals for additional resource as appropriate.  Diagnosis Axis none Adjustment Disorder with Anxiety (F43.22)   Axis none    Conditions For Discharge Achievement of treatment goals and  objectives              Tracey [name], PhD               Tracey [name], PhD

## 2023-06-23 ENCOUNTER — Ambulatory Visit: Payer: Medicare HMO | Admitting: Clinical

## 2023-06-23 ENCOUNTER — Ambulatory Visit (INDEPENDENT_AMBULATORY_CARE_PROVIDER_SITE_OTHER): Payer: Medicare HMO | Admitting: Otolaryngology

## 2023-06-23 ENCOUNTER — Encounter (INDEPENDENT_AMBULATORY_CARE_PROVIDER_SITE_OTHER): Payer: Self-pay | Admitting: Otolaryngology

## 2023-06-23 VITALS — BP 151/82 | HR 82 | Ht 65.0 in | Wt 215.0 lb

## 2023-06-23 DIAGNOSIS — R058 Other specified cough: Secondary | ICD-10-CM | POA: Diagnosis not present

## 2023-06-23 DIAGNOSIS — R0981 Nasal congestion: Secondary | ICD-10-CM

## 2023-06-23 DIAGNOSIS — F4322 Adjustment disorder with anxiety: Secondary | ICD-10-CM

## 2023-06-23 DIAGNOSIS — R0982 Postnasal drip: Secondary | ICD-10-CM

## 2023-06-23 DIAGNOSIS — R040 Epistaxis: Secondary | ICD-10-CM | POA: Diagnosis not present

## 2023-06-23 DIAGNOSIS — R053 Chronic cough: Secondary | ICD-10-CM | POA: Diagnosis not present

## 2023-06-23 DIAGNOSIS — K219 Gastro-esophageal reflux disease without esophagitis: Secondary | ICD-10-CM

## 2023-06-23 DIAGNOSIS — J3089 Other allergic rhinitis: Secondary | ICD-10-CM

## 2023-06-23 MED ORDER — CETIRIZINE HCL 10 MG PO TABS: 10.0000 mg | ORAL_TABLET | Freq: Every day | ORAL | 11 refills | Status: DC
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
## 2023-06-23 NOTE — Patient Instructions (Signed)
[email or web address] - check out this website to learn more about reflux   -Avoid lying down for at least two hours after a meal or after drinking acidic beverages, like soda, or other caffeinated beverages. This can help to prevent stomach contents from flowing back into the esophagus. -Keep your head elevated while you sleep. Using an extra pillow or two can also help to prevent reflux. -Eat smaller and more frequent meals each day instead of a few large meals. This promotes digestion and can aid in preventing heartburn. -Wear loose-fitting clothes to ease pressure on the stomach, which can worsen heartburn and reflux. -Reduce excess weight around the midsection. This can ease pressure on the stomach. Such pressure can force some stomach contents back up the esophagus  [email or web address] - check out this website to learn more about reflux   -Avoid lying down for at least two hours after a meal or after drinking acidic beverages, like soda, or other caffeinated beverages. This can help to prevent stomach contents from flowing back into the esophagus. -Keep your head elevated while you sleep. Using an extra pillow or two can also help to prevent reflux. -Eat smaller and more frequent meals each day instead of a few large meals. This promotes digestion and can aid in preventing heartburn. -Wear loose-fitting clothes to ease pressure on the stomach, which can worsen heartburn and reflux. -Reduce excess weight around the midsection. This can ease pressure on the stomach. Such pressure can force some stomach contents back up the esophagus

## 2023-06-23 NOTE — Progress Notes (Signed)
Time: 12:01 pm- 12:58 pm CPT Code: 57846N Diagnosis: F43.22  Tracey Wiley was seen in person for individual therapy. Session focused on dynamics in her relationship, related to conflicts that had arisen over the holidays. Therapist suggested communication strategies and resources. She is scheduled to be seen again in one week.   Treatment Plan Client Abilities/Strengths Tracey Wiley shared that she has participated in therapy in the past and has been very honest in session.  Client Treatment Preferences:  Tracey Wiley prefers in person appointments. She prefers Tuesday and Thursday afternoon appointments. Client Statement of Needs  Tracey Wiley is seeking validation of her emotional responses and overall self-concept. Treatment Level  Weekly   Symptoms Tearfulness, irritability Problems Addressed  Goals Tracey Wiley will reduce symptoms of depression and improve overall mood 1. Tracey Wiley will increase comfort in social situations Objective  Target Date: 01/08/2024 Frequency: Weekly  Progress: 0 Modality: Individual therapy  Objective  Target Date: 01/07/2024 Frequency: Weekly  Progress: 0 Modality: individual therapy  Related Interventions  Tracey Wiley will have opportunities to process her experiences in session  Therapist will point out maladaptive thought and behavior patterns using CBT strategies. Therapist will incorporate behavior activation as appropriate. Therapist will incorporate gradual exposure therapy as appropriate. Therapist will provide referrals for additional resource as appropriate.  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

## 2023-06-28 ENCOUNTER — Other Ambulatory Visit: Payer: Self-pay | Admitting: Physician Assistant

## 2023-06-30 ENCOUNTER — Ambulatory Visit (INDEPENDENT_AMBULATORY_CARE_PROVIDER_SITE_OTHER): Payer: Medicare HMO | Admitting: Clinical

## 2023-06-30 DIAGNOSIS — F4322 Adjustment disorder with anxiety: Secondary | ICD-10-CM | POA: Diagnosis not present

## 2023-06-30 NOTE — Progress Notes (Signed)
Time: 12:01 pm- 12:58 pm CPT Code: 23557D-22 Diagnosis: F43.22  Arther was seen remotely using secure video conferencing. She was in her home and therapist was in her office at time of appointment. Client is aware of risks of telehealth and consented to a virtual visit. Session focused on an incident that had occurred in the past week where Suzeth went for what she thought was a consult with her ENT doctor, but instead learned that she was scheduled for a procedure. Therapist offered an opportunity to process feelings, as well as validation and support, and reframing of negative self-talk. She is scheduled to be seen again in one week.   Treatment Plan Client Abilities/Strengths Atia shared that she has participated in therapy in the past and has been very honest in session.  Client Treatment Preferences:  Stori prefers in person appointments. She prefers Tuesday and Thursday afternoon appointments. Client Statement of Needs  Naairah is seeking validation of her emotional responses and overall self-concept. Treatment Level  Weekly   Symptoms Tearfulness, irritability Problems Addressed  Goals Esta will reduce symptoms of depression and improve overall mood 1. Henlie will increase comfort in social situations Objective  Target Date: 01/08/2024 Frequency: Weekly  Progress: 0 Modality: Individual therapy  Objective  Target Date: 01/07/2024 Frequency: Weekly  Progress: 0 Modality: individual therapy  Related Interventions  Swayze will have opportunities to process her experiences in session  Therapist will point out maladaptive thought and behavior patterns using CBT strategies. Therapist will incorporate behavior activation as appropriate. Therapist will incorporate gradual exposure therapy as appropriate. Therapist will provide referrals for additional resource as appropriate.  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

## 2023-07-05 ENCOUNTER — Ambulatory Visit: Payer: Medicare HMO | Admitting: Internal Medicine

## 2023-07-05 ENCOUNTER — Encounter: Payer: Self-pay | Admitting: Internal Medicine

## 2023-07-05 VITALS — BP 148/78 | HR 71 | Ht 65.0 in | Wt 220.4 lb

## 2023-07-05 DIAGNOSIS — L732 Hidradenitis suppurativa: Secondary | ICD-10-CM

## 2023-07-05 DIAGNOSIS — M150 Primary generalized (osteo)arthritis: Secondary | ICD-10-CM

## 2023-07-05 DIAGNOSIS — M1990 Unspecified osteoarthritis, unspecified site: Secondary | ICD-10-CM | POA: Insufficient documentation

## 2023-07-05 MED ORDER — MUPIROCIN 2 % EX OINT: 1.0000 | TOPICAL_OINTMENT | Freq: Two times a day (BID) | CUTANEOUS | 0 refills | Status: DC

## 2023-07-05 MED ORDER — AMLODIPINE BESYLATE 2.5 MG PO TABS: 2.5000 mg | ORAL_TABLET | Freq: Every day | ORAL | 1 refills | Status: DC

## 2023-07-05 MED ORDER — CEPHALEXIN 500 MG PO CAPS
500.0000 mg | ORAL_CAPSULE | Freq: Four times a day (QID) | ORAL | 0 refills | Status: DC
Start: 2023-07-05 — End: 2023-10-07

## 2023-07-05 NOTE — Assessment & Plan Note (Signed)
Continue weekly hibiclens.  Add mupirocin ointment to nares twice daily for one week of every month.  Keflex rx put on file at pharmacy for use if episodes recur

## 2023-07-05 NOTE — Progress Notes (Signed)
Subjective:  Patient ID: Tracey Wiley, female    DOB: 08-12-1955  Age: 68 y.o. MRN: [id number]  CC: The primary encounter diagnosis was Primary osteoarthritis involving multiple joints. A diagnosis of Hidradenitis suppurativa of multiple sites was also pertinent to this visit.   HPI Tracey Wiley presents for  Chief Complaint  Patient presents with   Medical Management of Chronic Issues   1) chronic cough:  seen by ENT 1/16:  Recommend Reflux Gourmet supplement after meals - Provide handout on reflux control and continue current reflux medications (Pepcid 20 mg and Nexium 40 mg daily) - Refer to allergy specialist for retesting and potential allergy shots - Prescribe Zyrtec 10 mg at night - Advise use of saline spray and Vaseline for nasal dryness and right sided epistaxis     2) recurrent episodes of joint pain  feels like she has the flu most days .  Thighs hurt continually,  tops of knees hurt transiently (less than 10 sec) when she stands up .  Averaging 6-10,000 steps daily  just staying in the house .  Stiffens up after 20 minutes of sitting .  Hands  will hurt intermittently, usually in the middle of the night.  Wake her up if she sleeps on her side . Improve with activity .  Has no problem on days she is out running errands,  averaging [id number] steps  ,  feels tired and achy by the end of the day but fine during activities.   Averaging 100 ounces of water daily due to  being thirsty,  dry mouth.  Has kept a food diary notes that [place] bothers her hands   3) history of recurrent MRSA infections during residence in [place].    Jan 1998 surgery on left ear, done in house and hospitalized for one week per ENT protocol. .  Feb 1998 first episode of hand cellulitis diagnosed (incorrectly per patient) as a reaction to artificial nails/tips  .  Was treated with topical antibiotics , antihistmanes and tips removal.  March 1998 hospitalized for facial cellulitis  attributed to MRSA  infection .   Referred to immunologist after cellulitis,  had surveillance labs weekly  1999 : started having recurrent boils in axilla and mons pubis treated with oral cephalexin ,  referred to ID and treated with infusion therapy but developed recurrent PICC line infections  from IV  so  returned ot oral abx tx with cephalexin for recurrent boils,  stopped occurring in 2001.  2009:  started having recurrence,  required surgical incision and drainage by Dr Excell Seltzer (ely 's colleague).  Since then as used Septra DS but has intolerance to septra due to headache, lethargy,   Last episode of axillary/pubic boil occurred during  nov 2024  resolved with cephalexin   Uses an all natural goat milk soap as her daily shower soap .  Uses chlorhexadine once a week on private parts since November       Outpatient Medications Prior to Visit  Medication Sig Dispense Refill   acetaminophen (TYLENOL) 500 MG tablet Take 500 mg by mouth daily as needed for pain.     amoxicillin (AMOXIL) 500 MG capsule TAKE 4 TABS 1 HOUR PRIOR TO DENTAL PROCEDURES 4 capsule 1   aspirin EC 81 MG tablet Take 81 mg by mouth daily.     cholecalciferol (VITAMIN D3) 25 MCG (1000 UNIT) tablet Take 1,000 Units by mouth daily.     colchicine 0.6 MG tablet Take 1 tablet (  0.6 mg total) by mouth 2 (two) times daily as needed. 180 tablet 0   [facility] HYDROCORTISONE ANTI-ITCH 0.5 % APPLY TO THE AFFECTED AREA TWICE A DAY 57 g 11   esomeprazole (NEXIUM) 40 MG capsule Take 1 capsule (40 mg total) by mouth daily. 90 capsule 3   ezetimibe (ZETIA) 10 MG tablet TAKE 1 TABLET BY MOUTH EVERY DAY 90 tablet 2   famotidine (PEPCID) 20 MG tablet TAKE 1 TABLET BY MOUTH EVERYDAY AT BEDTIME 90 tablet 0   Flaxseed, Linseed, (FLAX SEED OIL) 1000 MG CAPS Take 1 capsule by mouth 2 (two) times daily.     furosemide (LASIX) 20 MG tablet Take 1 tablet (20 mg total) by mouth daily as needed. (Patient taking differently: Take 20 mg by mouth daily.) 90 tablet 3   magnesium gluconate  (MAGONATE) 500 MG tablet Take 500 mg by mouth at bedtime.     MELATONIN PO Take 8 mg by mouth at bedtime.     Omega-3 Fatty Acids (OMEGA-3 FISH OIL PO) Take 1 capsule by mouth daily.     ondansetron (ZOFRAN ODT) 4 MG disintegrating tablet Take 1 tablet (4 mg total) by mouth every 8 (eight) hours as needed for nausea or vomiting. 20 tablet 0   potassium chloride (KLOR-CON) 10 MEQ tablet Take 1 tablet (10 mEq total) by mouth daily as needed. (Patient taking differently: Take 10 mEq by mouth daily.) 90 tablet 3   promethazine (PHENERGAN) 12.5 MG tablet Take 12.5 mg by mouth every 6 (six) hours as needed for nausea or vomiting.     sotalol (BETAPACE) 80 MG tablet TAKE 1 TABLET BY MOUTH TWICE A DAY 180 tablet 3   valACYclovir (VALTREX) 1000 MG tablet TAKE 1 TABLET BY MOUTH DAILY FOR SUPPRESSION , OR 5 TIMES DAILY FOR FLARE (Patient taking differently: Take 1,000 mg by mouth as needed. For flare up) 35 tablet 2   vitamin C (ASCORBIC ACID) 500 MG tablet Take 1,000 mg by mouth daily.     zinc gluconate 50 MG tablet Take 50 mg by mouth daily.     triamterene-hydrochlorothiazide (DYAZIDE) 37.5-25 MG capsule TAKE 1 CAPSULE BY MOUTH EVERY DAY 90 capsule 2   Bempedoic Acid (NEXLETOL) 180 MG TABS Take 1 tablet (180 mg total) by mouth daily. (Patient not taking: Reported on 07/05/2023) 90 tablet 3   cetirizine (ZYRTEC) 10 MG tablet Take 1 tablet (10 mg total) by mouth daily. (Patient not taking: Reported on 07/05/2023) 30 tablet 11   albuterol (VENTOLIN HFA) 108 (90 Base) MCG/ACT inhaler Inhale 2 puffs into the lungs every 6 (six) hours as needed for wheezing or shortness of breath. (Patient not taking: Reported on 07/05/2023) 1 each 1   No facility-administered medications prior to visit.    Review of Systems;  Patient denies headache, fevers, malaise, unintentional weight loss, skin rash, eye pain, sinus congestion and sinus pain, sore throat, dysphagia,  hemoptysis , cough, dyspnea, wheezing, chest pain,  palpitations, orthopnea, edema, abdominal pain, nausea, melena, diarrhea, constipation, flank pain, dysuria, hematuria, urinary  Frequency, nocturia, numbness, tingling, seizures,  Focal weakness, Loss of consciousness,  Tremor, insomnia, depression, anxiety, and suicidal ideation.      Objective:  BP (!) 148/78   Pulse 71   Ht 5\' 5"  (1.651 m)   Wt 220 lb 6.4 oz (100 kg)   SpO2 97%   BMI 36.68 kg/m   BP Readings from Last 3 Encounters:  07/05/23 (!) 148/78  06/23/23 (!) 151/82  05/17/23 124/76  Wt Readings from Last 3 Encounters:  07/05/23 220 lb 6.4 oz (100 kg)  06/23/23 215 lb (97.5 kg)  05/17/23 225 lb 3.2 oz (102.2 kg)    Physical Exam Vitals reviewed.  Constitutional:      General: She is not in acute distress.    Appearance: Normal appearance. She is normal weight. She is not ill-appearing, toxic-appearing or diaphoretic.  HENT:     Head: Normocephalic.  Eyes:     General: No scleral icterus.       Right eye: No discharge.        Left eye: No discharge.     Conjunctiva/sclera: Conjunctivae normal.  Cardiovascular:     Rate and Rhythm: Normal rate and regular rhythm.     Heart sounds: Normal heart sounds.  Pulmonary:     Effort: Pulmonary effort is normal. No respiratory distress.     Breath sounds: Normal breath sounds.  Musculoskeletal:        General: Normal range of motion.  Skin:    General: Skin is warm and dry.  Neurological:     General: No focal deficit present.     Mental Status: She is alert and oriented to person, place, and time. Mental status is at baseline.  Psychiatric:        Mood and Affect: Mood normal.        Behavior: Behavior normal.        Thought Content: Thought content normal.        Judgment: Judgment normal.    Lab Results  Component Value Date   HGBA1C 6.1 (H) 06/10/2023   HGBA1C 6.2 (H) 10/13/2022   HGBA1C 5.8 (H) 12/22/2021    Lab Results  Component Value Date   CREATININE 0.82 10/13/2022   CREATININE 0.96  03/12/2022   CREATININE 0.71 12/04/2021    Lab Results  Component Value Date   WBC 8.0 05/17/2023   HGB 12.5 05/17/2023   HCT 38.2 05/17/2023   PLT 339.0 05/17/2023   GLUCOSE 109 (H) 10/13/2022   CHOL 234 (H) 10/13/2022   TRIG 165 (H) 10/13/2022   HDL 48 10/13/2022   LDLDIRECT 159 (H) 10/13/2022   LDLCALC 153 (H) 10/13/2022   ALT 15 10/13/2022   AST 21 10/13/2022   NA 138 10/13/2022   K 3.8 10/13/2022   CL 101 10/13/2022   CREATININE 0.82 10/13/2022   BUN 14 10/13/2022   CO2 29 10/13/2022   TSH 2.52 05/17/2023   HGBA1C 6.1 (H) 06/10/2023   MICROALBUR <0.7 10/25/2022    No results found.  Assessment & Plan:  .Primary osteoarthritis involving multiple joints Assessment & Plan: Referring to local PT for management of pain and stiffness   Orders: -     Ambulatory referral to Physical Therapy  Hidradenitis suppurativa of multiple sites Assessment & Plan: Continue weekly hibiclens.  Add mupirocin ointment to nares twice daily for one week of every month.  Keflex rx put on file at pharmacy for use if episodes recur   Other orders -     Mupirocin; Apply 1 Application topically 2 (two) times daily. To nares  Dispense: 22 g; Refill: 0 -     Cephalexin; Take 1 capsule (500 mg total) by mouth 4 (four) times daily.  Dispense: 28 capsule; Refill: 0 -     amLODIPine Besylate; Take 1 tablet (2.5 mg total) by mouth daily.  Dispense: 90 tablet; Refill: 1     I provided 50 minutes of face-to-face time during this encounter  reviewing patient's last visit with me, patient's  most recent visit with rheumatologu,  recent surgical and non surgical procedures, previous  labs and imaging studies, counseling on currently addressed issues,  and post visit ordering to diagnostics and therapeutics .   Follow-up: Return in about 4 weeks (around 08/02/2023) for hypertension.   Sherlene Shams, MD

## 2023-07-05 NOTE — Patient Instructions (Addendum)
The book I referred to today when we were discussing your joint pain  issues is Dr Gordy Councilman Bennett's book "AIP Diet for Beginners: A Comprehensive Guide to the Autoimmune Paleo protocol"      Mupirocin twice daily in the nose for one week out of every month ( this is to eradicate MRSA)   Hibiclens weekly    Start keflex  for any flare of boils or hand   PT for the arthritis    Stop the maxzide and start amlodipine for blood pressure,  if not below 130/80 after one week,  double the dose .  If you develop swelling you can increase the lasix to DAILY

## 2023-07-05 NOTE — Assessment & Plan Note (Signed)
Referring to local PT for management of pain and stiffness

## 2023-07-07 ENCOUNTER — Ambulatory Visit (INDEPENDENT_AMBULATORY_CARE_PROVIDER_SITE_OTHER): Payer: Medicare HMO | Admitting: Clinical

## 2023-07-07 DIAGNOSIS — F4322 Adjustment disorder with anxiety: Secondary | ICD-10-CM

## 2023-07-07 NOTE — Progress Notes (Signed)
Time: 12:01 pm- 12:58 pm CPT Code: 59563O-75 Diagnosis: F43.22  Tracey Wiley was seen remotely using secure video conferencing. She was in her home and therapist was in her office at time of appointment. Client is aware of risks of telehealth and consented to a virtual visit. Session Tracey Wiley having learned that her sister's sister in law, with whom she had a conflicted relationship, had passed away several days prior. Therapist offered validation and support, and suggested looking for a way to memorialize her. She is scheduled to be seen again in one week.   Treatment Plan Client Abilities/Strengths Tracey Wiley shared that she has participated in therapy in the past and has been very honest in session.  Client Treatment Preferences:  Tracey Wiley prefers in person appointments. She prefers Tuesday and Thursday afternoon appointments. Client Statement of Needs  Tracey Wiley is seeking validation of her emotional responses and overall self-concept. Treatment Level  Weekly   Symptoms Tearfulness, irritability Problems Addressed  Goals Tracey Wiley will reduce symptoms of depression and improve overall mood 1. Tracey Wiley will increase comfort in social situations Objective  Target Date: 01/08/2024 Frequency: Weekly  Progress: 0 Modality: Individual therapy  Objective  Target Date: 01/07/2024 Frequency: Weekly  Progress: 0 Modality: individual therapy  Related Interventions  Tracey Wiley will have opportunities to process her experiences in session  Therapist will point out maladaptive thought and behavior patterns using CBT strategies. Therapist will incorporate behavior activation as appropriate. Therapist will incorporate gradual exposure therapy as appropriate. Therapist will provide referrals for additional resource as appropriate.  Diagnosis Axis none Adjustment Disorder with Anxiety (F43.22)   Axis none    Conditions For Discharge Achievement of treatment goals and objectives         [name],  PhD               [name], PhD

## 2023-07-14 ENCOUNTER — Ambulatory Visit: Payer: Medicare HMO | Admitting: Clinical

## 2023-07-14 DIAGNOSIS — F4322 Adjustment disorder with anxiety: Secondary | ICD-10-CM

## 2023-07-14 NOTE — Progress Notes (Signed)
 Time: 12:01 pm- 12:58 pm CPT Code: 09162E-04 Diagnosis: F43.22  Tracey Wiley was seen remotely using secure video conferencing. She was in her home and therapist was in her office at time of appointment. Client is aware of risks of telehealth and consented to a virtual visit. Session included discussing challenges setting and holding boundaries with clients, as challenges that had arisen in her friendship with her hair stylist. Therapist suggested communication strategies to support boundary setting and offered validation and support. She is scheduled to be seen again in one week.   Treatment Plan Client Abilities/Strengths Tracey Wiley shared that she has participated in therapy in the past and has been very honest in session.  Client Treatment Preferences:  Tracey Wiley prefers in person appointments. She prefers Tuesday and Thursday afternoon appointments. Client Statement of Needs  Tracey Wiley is seeking validation of her emotional responses and overall self-concept. Treatment Level  Weekly   Symptoms Tearfulness, irritability Problems Addressed  Goals Tracey Wiley will reduce symptoms of depression and improve overall mood 1. Tracey Wiley will increase comfort in social situations Objective  Target Date: 01/08/2024 Frequency: Weekly  Progress: 0 Modality: Individual therapy  Objective  Target Date: 01/07/2024 Frequency: Weekly  Progress: 0 Modality: individual therapy  Related Interventions  Tracey Wiley will have opportunities to process her experiences in session  Therapist will point out maladaptive thought and behavior patterns using CBT strategies. Therapist will incorporate behavior activation as appropriate. Therapist will incorporate gradual exposure therapy as appropriate. Therapist will provide referrals for additional resource as appropriate.  Diagnosis Axis none Adjustment Disorder with Anxiety (F43.22)   Axis none    Conditions For Discharge Achievement of treatment goals and objectives         Andriette [name],  PhD               Andriette [name], PhD               Andriette [name], PhD

## 2023-07-17 ENCOUNTER — Other Ambulatory Visit (INDEPENDENT_AMBULATORY_CARE_PROVIDER_SITE_OTHER): Payer: Self-pay | Admitting: Otolaryngology

## 2023-07-18 ENCOUNTER — Encounter: Payer: Self-pay | Admitting: Internal Medicine

## 2023-07-21 ENCOUNTER — Other Ambulatory Visit: Payer: Self-pay | Admitting: Internal Medicine

## 2023-07-21 ENCOUNTER — Ambulatory Visit: Payer: Medicare HMO | Admitting: Allergy

## 2023-07-21 ENCOUNTER — Ambulatory Visit: Payer: Medicare HMO | Admitting: Clinical

## 2023-07-21 DIAGNOSIS — F4322 Adjustment disorder with anxiety: Secondary | ICD-10-CM | POA: Diagnosis not present

## 2023-07-21 DIAGNOSIS — R600 Localized edema: Secondary | ICD-10-CM

## 2023-07-21 NOTE — Progress Notes (Signed)
Time: 12:01 pm- 12:58 pm CPT Code: 84132G-40 Diagnosis: F43.22  Tracey Wiley was seen remotely using secure video conferencing. She was in her home and therapist was in her office at time of appointment. Client is aware of risks of telehealth and consented to a virtual visit. Tracey Wiley reported that she had come down with the flu following her last session, and had taken two of her medications not as prescribed to help manage symptoms. Therapist encouraged her to communicate with her primary care doctor or seek medical care in through a nearby urgent care, and challenged several cognitive distortions. Tracey Wiley also reflected upon a conversation she had had with her brother, and therapist offered validation and support. She is scheduled to be seen again in one week.   Treatment Plan Client Abilities/Strengths Tracey Wiley shared that she has participated in therapy in the past and has been very honest in session.  Client Treatment Preferences:  Tracey Wiley prefers in person appointments. She prefers Tuesday and Thursday afternoon appointments. Client Statement of Needs  Tracey Wiley is seeking validation of her emotional responses and overall self-concept. Treatment Level  Weekly   Symptoms Tearfulness, irritability Problems Addressed  Goals Tracey Wiley will reduce symptoms of depression and improve overall mood 1. Tracey Wiley will increase comfort in social situations Objective  Target Date: 01/08/2024 Frequency: Weekly  Progress: 0 Modality: Individual therapy  Objective  Target Date: 01/07/2024 Frequency: Weekly  Progress: 0 Modality: individual therapy  Related Interventions  Tracey Wiley will have opportunities to process her experiences in session  Therapist will point out maladaptive thought and behavior patterns using CBT strategies. Therapist will incorporate behavior activation as appropriate. Therapist will incorporate gradual exposure therapy as appropriate. Therapist will provide referrals for additional resource as appropriate.   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

## 2023-07-26 ENCOUNTER — Telehealth: Payer: Self-pay | Admitting: Cardiovascular Disease

## 2023-07-26 MED ORDER — NEXLETOL 180 MG PO TABS: 180.0000 mg | ORAL_TABLET | Freq: Every day | ORAL | 0 refills | Status: DC

## 2023-07-26 NOTE — Telephone Encounter (Signed)
 Requested Prescriptions   Signed Prescriptions Disp Refills   Bempedoic Acid (NEXLETOL) 180 MG TABS 90 tablet 0    Sig: Take 1 tablet (180 mg total) by mouth daily.    Authorizing Provider: Antonieta Iba    Ordering User: Kendrick Fries

## 2023-07-26 NOTE — Telephone Encounter (Signed)
*  STAT* If patient is at the pharmacy, call can be transferred to refill team.   1. Which medications need to be refilled? (please list name of each medication and dose if known) Bempedoic Acid (NEXLETOL) 180 MG TABS    2. Would you like to learn more about the convenience, safety, & potential cost savings by using the [facility]?      3. Are you open to using the [facility] (Type [facility]. ).   4. Which pharmacy/location (including street and city if local pharmacy) is medication to be sent to? [id number] - [place], [place] - [address]    5. Do they need a 30 day or 90 day supply? 90

## 2023-07-28 ENCOUNTER — Ambulatory Visit: Payer: Medicare HMO | Admitting: Clinical

## 2023-07-28 DIAGNOSIS — F4322 Adjustment disorder with anxiety: Secondary | ICD-10-CM

## 2023-07-28 NOTE — Progress Notes (Signed)
   [name], PhD

## 2023-07-28 NOTE — Progress Notes (Signed)
 Time: 12:01 pm- 12:58 pm CPT Code: 16109U-04 Diagnosis: F43.22  Dejuana was seen remotely using secure video conferencing. She was in her home and therapist was in her office at time of appointment. Client is aware of risks of telehealth and consented to a virtual visit. Session focused on continued challenges in several relationships. Therapist engaged her in perspective taking and suggested alternate ways of looking at several situations. Cherrise had not completed her homework of scheduling a doctor's appointment, but shared that she has one coming up in the next week. She is scheduled to be seen again in one week.   Treatment Plan Client Abilities/Strengths Leinaala shared that she has participated in therapy in the past and has been very honest in session.  Client Treatment Preferences:  Shenay prefers in person appointments. She prefers Tuesday and Thursday afternoon appointments. Client Statement of Needs  Jaydalyn is seeking validation of her emotional responses and overall self-concept. Treatment Level  Weekly   Symptoms Tearfulness, irritability Problems Addressed  Goals Leaira will reduce symptoms of depression and improve overall mood 1. Baelynn will increase comfort in social situations Objective  Target Date: 01/08/2024 Frequency: Weekly  Progress: 0 Modality: Individual therapy  Objective  Target Date: 01/07/2024 Frequency: Weekly  Progress: 0 Modality: individual therapy  Related Interventions  Chantella will have opportunities to process her experiences in session  Therapist will point out maladaptive thought and behavior patterns using CBT strategies. Therapist will incorporate behavior activation as appropriate. Therapist will incorporate gradual exposure therapy as appropriate. Therapist will provide referrals for additional resource as appropriate.  Diagnosis Axis none Adjustment Disorder with Anxiety (F43.22)   Axis none    Conditions For Discharge Achievement of treatment goals and  objectives      [name], PhD               [name], PhD

## 2023-08-03 ENCOUNTER — Ambulatory Visit: Payer: Medicare HMO | Admitting: Internal Medicine

## 2023-08-04 ENCOUNTER — Ambulatory Visit (INDEPENDENT_AMBULATORY_CARE_PROVIDER_SITE_OTHER): Payer: Medicare HMO | Admitting: Clinical

## 2023-08-04 DIAGNOSIS — F4322 Adjustment disorder with anxiety: Secondary | ICD-10-CM | POA: Diagnosis not present

## 2023-08-04 NOTE — Progress Notes (Signed)
 Time: 12:01 pm- 12:58 pm CPT Code: 91478G-95 Diagnosis: F43.22  Seynabou was seen remotely using secure video conferencing. She was in her home and therapist was in her office at time of appointment. Client is aware of risks of telehealth and consented to a virtual visit. Heather processed current dynamics in her relationships in light of close relationships she has had in the past with her sister, childhood friend, and mother, querying whether she was actually strong, or had just gotten her strength from them. Therapist shared the notion of watering her seeds of strength, and encouraged her to consider which seeds she would like to water for homework. She is scheduled to be seen again in one week.   Treatment Plan Client Abilities/Strengths Shalaina shared that she has participated in therapy in the past and has been very honest in session.  Client Treatment Preferences:  Ziza prefers in person appointments. She prefers Tuesday and Thursday afternoon appointments. Client Statement of Needs  Alenna is seeking validation of her emotional responses and overall self-concept. Treatment Level  Weekly   Symptoms Tearfulness, irritability Problems Addressed  Goals Rickesha will reduce symptoms of depression and improve overall mood 1. Shiesha will increase comfort in social situations Objective  Target Date: 01/08/2024 Frequency: Weekly  Progress: 0 Modality: Individual therapy  Objective  Target Date: 01/07/2024 Frequency: Weekly  Progress: 0 Modality: individual therapy  Related Interventions  Jarika will have opportunities to process her experiences in session  Therapist will point out maladaptive thought and behavior patterns using CBT strategies. Therapist will incorporate behavior activation as appropriate. Therapist will incorporate gradual exposure therapy as appropriate. Therapist will provide referrals for additional resource as appropriate.  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

## 2023-08-11 ENCOUNTER — Other Ambulatory Visit: Payer: Self-pay

## 2023-08-11 ENCOUNTER — Ambulatory Visit (INDEPENDENT_AMBULATORY_CARE_PROVIDER_SITE_OTHER): Payer: Medicare HMO | Admitting: Clinical

## 2023-08-11 ENCOUNTER — Encounter: Payer: Self-pay | Admitting: Allergy

## 2023-08-11 ENCOUNTER — Ambulatory Visit (INDEPENDENT_AMBULATORY_CARE_PROVIDER_SITE_OTHER): Payer: Medicare HMO | Admitting: Allergy

## 2023-08-11 VITALS — BP 124/68 | HR 64 | Temp 98.7°F | Ht 63.39 in | Wt 222.3 lb

## 2023-08-11 DIAGNOSIS — K2100 Gastro-esophageal reflux disease with esophagitis, without bleeding: Secondary | ICD-10-CM

## 2023-08-11 DIAGNOSIS — F4322 Adjustment disorder with anxiety: Secondary | ICD-10-CM

## 2023-08-11 DIAGNOSIS — R053 Chronic cough: Secondary | ICD-10-CM

## 2023-08-11 DIAGNOSIS — J310 Chronic rhinitis: Secondary | ICD-10-CM | POA: Diagnosis not present

## 2023-08-11 MED ORDER — IPRATROPIUM BROMIDE 0.06 % NA SOLN: NASAL | 5 refills | Status: DC

## 2023-08-11 MED ORDER — LEVALBUTEROL TARTRATE 45 MCG/ACT IN AERO: 2.0000 | INHALATION_SPRAY | RESPIRATORY_TRACT | 1 refills | Status: AC | PRN

## 2023-08-11 NOTE — Patient Instructions (Addendum)
 Chronic Cough with Sputum Production Chronic cough with nocturnal exacerbation and morning sputum. Differential includes postnasal drip, esophageal issues, or primary lung issue. Chest CT from 2023 report showed bilateral air trapping, suggesting obstructive issue. Discussed airway inflammation's role in symptoms. - Perform pulmonary function test today is very normal! - Prescribe Xopenex inhaler 2 puffs every 4 hours as needed for cough, shortness of breath, wheeze or chest tightness to manage symptoms. - Consider pulmonologist referral for comprehensive testing.  Postnasal Drip Postnasal drip suspected as cough contributor. No sinus regimen. Need for allergy testing to identify triggers. Discussed nasal spray application methods due to anxiety. - Start Zyrtec 10mg  daily at this time. - Prescribe Atrovent nasal spray with Q-tip application method.  Use 1-2 times a day as needed for drainage control - Schedule allergy skin testing. Hold antihistamines for 3 days prior to this visit.   Allergic Rhinitis Previous allergies to cats, feathers, trees, and weed. Need to reassess allergens due to environmental change. Discussed potential for nonallergic rhinitis and allergy testing. - Schedule allergy skin testing. - Discuss potential for allergy shots based on results.  Acid Reflux and Hiatal Hernia Reflux and hiatal hernia well-controlled with Nexium and Pepcid. Reflux may contribute to cough. Discussed importance of maintaining reflux control. - Continue Nexium and Pepcid regimen.  Follow-up for skin testing

## 2023-08-11 NOTE — Progress Notes (Signed)
 Time: 12:01 pm- 12:58 pm CPT Code: 98119J-47 Diagnosis: F43.22  Tracey Wiley was seen in person for therapy.She reported that since her last session, she had been reflecting upon traumatic experiences in her life, of which she reported having many. Session focused on processing several of these experiences, as well as considering the possible impact on her relationships. She is scheduled to be seen again in one week.   Treatment Plan Client Abilities/Strengths Tracey Wiley shared that she has participated in therapy in the past and has been very honest in session.  Client Treatment Preferences:  Tracey Wiley prefers in person appointments. She prefers Tuesday and Thursday afternoon appointments. Client Statement of Needs  Tracey Wiley is seeking validation of her emotional responses and overall self-concept. Treatment Level  Weekly   Symptoms Tearfulness, irritability Problems Addressed  Goals Tracey Wiley will reduce symptoms of depression and improve overall mood 1. Tracey Wiley will increase comfort in social situations Objective  Target Date: 01/08/2024 Frequency: Weekly  Progress: 0 Modality: Individual therapy  Objective  Target Date: 01/07/2024 Frequency: Weekly  Progress: 0 Modality: individual therapy  Related Interventions  Tracey Wiley will have opportunities to process her experiences in session  Therapist will point out maladaptive thought and behavior patterns using CBT strategies. Therapist will incorporate behavior activation as appropriate. Therapist will incorporate gradual exposure therapy as appropriate. Therapist will provide referrals for additional resource as appropriate.  

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
## 2023-08-13 ENCOUNTER — Other Ambulatory Visit: Payer: Self-pay | Admitting: Physician Assistant

## 2023-08-16 ENCOUNTER — Ambulatory Visit: Payer: Medicare HMO | Admitting: Clinical

## 2023-08-16 DIAGNOSIS — F4322 Adjustment disorder with anxiety: Secondary | ICD-10-CM
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
## 2023-08-18 ENCOUNTER — Ambulatory Visit: Payer: Medicare HMO | Admitting: Clinical

## 2023-08-19 ENCOUNTER — Encounter: Payer: Self-pay | Admitting: Internal Medicine

## 2023-08-19 ENCOUNTER — Other Ambulatory Visit
Admission: RE | Admit: 2023-08-19 | Discharge: 2023-08-19 | Disposition: A | Discharge status: Home / Self Care | Source: Ambulatory Visit | Attending: Internal Medicine | Admitting: Internal Medicine

## 2023-08-19 ENCOUNTER — Ambulatory Visit (INDEPENDENT_AMBULATORY_CARE_PROVIDER_SITE_OTHER): Payer: Medicare HMO | Admitting: Internal Medicine

## 2023-08-19 VITALS — BP 138/86 | HR 73 | Ht 63.0 in | Wt 222.0 lb

## 2023-08-19 DIAGNOSIS — K121 Other forms of stomatitis: Secondary | ICD-10-CM

## 2023-08-19 DIAGNOSIS — K1230 Oral mucositis (ulcerative), unspecified: Secondary | ICD-10-CM

## 2023-08-19 DIAGNOSIS — R7303 Prediabetes: Secondary | ICD-10-CM | POA: Insufficient documentation

## 2023-08-19 LAB — C-REACTIVE PROTEIN: CRP: 2.5 mg/dL — ABNORMAL HIGH (ref <1.0)

## 2023-08-19 LAB — CBC WITH DIFFERENTIAL/PLATELET
Abs Immature Granulocytes: 0.02 10*3/uL (ref 0.00–0.07)
Basophils Absolute: 0 10*3/uL (ref 0.0–0.1)
Basophils Relative: 0 %
Eosinophils Absolute: 0.4 10*3/uL (ref 0.0–0.5)
Eosinophils Relative: 4 %
HCT: 38 % (ref 36.0–46.0)
Hemoglobin: 12.5 g/dL (ref 12.0–15.0)
Immature Granulocytes: 0 %
Lymphocytes Relative: 28 %
Lymphs Abs: 2.7 10*3/uL (ref 0.7–4.0)
MCH: 27.3 pg (ref 26.0–34.0)
MCHC: 32.9 g/dL (ref 30.0–36.0)
MCV: 83 fL (ref 80.0–100.0)
Monocytes Absolute: 0.6 10*3/uL (ref 0.1–1.0)
Monocytes Relative: 6 %
Neutro Abs: 5.9 10*3/uL (ref 1.7–7.7)
Neutrophils Relative %: 62 %
Platelets: 325 10*3/uL (ref 150–400)
RBC: 4.58 MIL/uL (ref 3.87–5.11)
RDW: 15.2 % (ref 11.5–15.5)
WBC: 9.6 10*3/uL (ref 4.0–10.5)
nRBC: 0 % (ref 0.0–0.2)

## 2023-08-19 LAB — SEDIMENTATION RATE: Sed Rate: 63 mm/h — ABNORMAL HIGH (ref 0–30)

## 2023-08-19 LAB — HEMOGLOBIN A1C
Hgb A1c MFr Bld: 5.9 % — ABNORMAL HIGH (ref 4.8–5.6)
Mean Plasma Glucose: 122.63 mg/dL

## 2023-08-19 MED ORDER — ZINC OXIDE 20 % EX OINT: 1.0000 | TOPICAL_OINTMENT | Freq: Two times a day (BID) | CUTANEOUS | 0 refills | Status: AC | PRN

## 2023-08-19 MED ORDER — NYSTATIN 100000 UNIT/ML MT SUSP: 5.0000 mL | Freq: Four times a day (QID) | OROMUCOSAL | 0 refills | Status: DC

## 2023-08-19 NOTE — Addendum Note (Signed)
 Addended by: Esperanza Sheets on: 08/19/2023 04:55 PM   Modules accepted: Orders

## 2023-08-19 NOTE — Patient Instructions (Signed)
 Use zinc oxide on the lips and corners of lips twice daily  No tongue brushing   Nystatin swish and swallow 4 times daily (for candida/thrush)

## 2023-08-19 NOTE — Progress Notes (Signed)
 Subjective:  Patient ID: Tracey Wiley, female    DOB: 01-15-1956  Age: 68 y.o. MRN: [id number]  CC: The primary encounter diagnosis was Stomatitis and mucositis. A diagnosis of Stomatitis was also pertinent to this visit.   HPI SHELSEY Wiley presents for  Chief Complaint  Patient presents with   Medical Management of Chronic Issues    1 month follow up    Cc:   lips became chapped and feeling dry for ten days.  Then hard palate of upper mouth became enflamed and painful,  followed by tongue becoming painful,  too painful to brush and developed diverts on the surface of tongue,  and edge has become scalloped ,  then gums became enflamed  . Has been using [employer] and [employer] and [employer] advanced white tooth paste for years,,  but switched to sensodyne  a few days ago  For lips:  started using vaseline followed by aquaphor for lips (made it worse) then Burts Bees which helped , hen started blistex on Sunday; lips feel better transiently and has develop cheilosis and downturning of lips .  Lips have felt numb. Since this started so she is unable to drink or use a straw without drooling.      History:  boyfriend was treated for flu with tamiflu  in January ; she developed same symptoms so she self treated with OTC mucinex  and tylenol, , tylenol cold and flu ,  and tylenol PM for sleep used them for 14 days.  Has not used any for a month    1) HTN: Patient is taking her medications as prescribed and notes no adverse effects.  Home BP readings have been done about once per week and are  generally < 130/80 .  She is avoiding added salt in her diet and walking regularly about 3 times per week for exercise  . Not taking amlodipine because the pharmacist told her it would cuase LE swelling    Outpatient Medications Prior to Visit  Medication Sig Dispense Refill   acetaminophen (TYLENOL) 500 MG tablet Take 500 mg by mouth daily as needed for pain.     amoxicillin (AMOXIL) 500 MG capsule TAKE  4 TABS 1 HOUR PRIOR TO DENTAL PROCEDURES 4 capsule 1   aspirin EC 81 MG tablet Take 81 mg by mouth daily.     Bempedoic Acid (NEXLETOL) 180 MG TABS Take 1 tablet (180 mg total) by mouth daily. 90 tablet 0   cetirizine (ZYRTEC) 10 MG tablet TAKE 1 TABLET BY MOUTH EVERY DAY 90 tablet 4   cholecalciferol (VITAMIN D3) 25 MCG (1000 UNIT) tablet Take 1,000 Units by mouth daily.     colchicine 0.6 MG tablet Take 1 tablet (0.6 mg total) by mouth 2 (two) times daily as needed. 180 tablet 0   [employer] HYDROCORTISONE ANTI-ITCH 0.5 % APPLY TO THE AFFECTED AREA TWICE A DAY 57 g 11   esomeprazole (NEXIUM) 40 MG capsule TAKE 1 CAPSULE (40 MG TOTAL) BY MOUTH DAILY. 30 capsule 0   ezetimibe (ZETIA) 10 MG tablet TAKE 1 TABLET BY MOUTH EVERY DAY 90 tablet 2   famotidine (PEPCID) 20 MG tablet TAKE 1 TABLET BY MOUTH EVERYDAY AT BEDTIME 90 tablet 0   Flaxseed, Linseed, (FLAX SEED OIL) 1000 MG CAPS Take 1 capsule by mouth 2 (two) times daily.     furosemide (LASIX) 20 MG tablet Take 1 tablet (20 mg total) by mouth daily as needed. (Patient taking differently: Take 20  mg by mouth daily.) 90 tablet 3   ipratropium (ATROVENT) 0.06 % nasal spray 1 application 1-2 times a day as needed for drainage control. 15 mL 5   levalbuterol (XOPENEX HFA) 45 MCG/ACT inhaler Inhale 2 puffs into the lungs every 4 (four) hours as needed for wheezing. 15 g 1   magnesium gluconate (MAGONATE) 500 MG tablet Take 500 mg by mouth at bedtime.     MELATONIN PO Take 8 mg by mouth at bedtime.     mupirocin ointment (BACTROBAN) 2 % Apply 1 Application topically 2 (two) times daily. To nares 22 g 0   Omega-3 Fatty Acids (OMEGA-3 FISH OIL PO) Take 1 capsule by mouth daily.     ondansetron (ZOFRAN ODT) 4 MG disintegrating tablet Take 1 tablet (4 mg total) by mouth every 8 (eight) hours as needed for nausea or vomiting. 20 tablet 0   potassium chloride (KLOR-CON) 10 MEQ tablet Take 1 tablet (10 mEq total) by mouth daily as needed. (Patient taking differently:  Take 10 mEq by mouth daily.) 90 tablet 3   promethazine (PHENERGAN) 12.5 MG tablet Take 12.5 mg by mouth every 6 (six) hours as needed for nausea or vomiting.     sotalol (BETAPACE) 80 MG tablet TAKE 1 TABLET BY MOUTH TWICE A DAY 180 tablet 3   valACYclovir (VALTREX) 1000 MG tablet TAKE 1 TABLET BY MOUTH DAILY FOR SUPPRESSION , OR 5 TIMES DAILY FOR FLARE (Patient taking differently: Take 1,000 mg by mouth as needed. For flare up) 35 tablet 2   vitamin C (ASCORBIC ACID) 500 MG tablet Take 1,000 mg by mouth daily.     zinc gluconate 50 MG tablet Take 50 mg by mouth daily.     cephALEXin (KEFLEX) 500 MG capsule Take 1 capsule (500 mg total) by mouth 4 (four) times daily. (Patient not taking: Reported on 08/19/2023) 28 capsule 0   amLODipine (NORVASC) 2.5 MG tablet Take 1 tablet (2.5 mg total) by mouth daily. (Patient not taking: Reported on 08/19/2023) 90 tablet 1   No facility-administered medications prior to visit.    Review of Systems;  Patient denies headache, fevers, malaise, unintentional weight loss, skin rash, eye pain, sinus congestion and sinus pain, sore throat, dysphagia,  hemoptysis , cough, dyspnea, wheezing, chest pain, palpitations, orthopnea, edema, abdominal pain, nausea, melena, diarrhea, constipation, flank pain, dysuria, hematuria, urinary  Frequency, nocturia, numbness, tingling, seizures,  Focal weakness, Loss of consciousness,  Tremor, insomnia, depression, anxiety, and suicidal ideation.      Objective:  BP 138/86   Pulse 73   Ht 5\' 3"  (1.6 m)   Wt 222 lb (100.7 kg)   SpO2 96%   BMI 39.33 kg/m   BP Readings from Last 3 Encounters:  08/19/23 138/86  08/11/23 124/68  07/05/23 (!) 148/78    Wt Readings from Last 3 Encounters:  08/19/23 222 lb (100.7 kg)  08/11/23 222 lb 4.8 oz (100.8 kg)  07/05/23 220 lb 6.4 oz (100 kg)    Physical Exam Vitals reviewed.  Constitutional:      General: She is not in acute distress.    Appearance: Normal appearance. She is  normal weight. She is not ill-appearing, toxic-appearing or diaphoretic.  HENT:     Head: Normocephalic.     Mouth/Throat:     Tongue: No lesions.     Palate: No lesions.     Pharynx: Oropharynx is clear. Uvula midline.     Tonsils: No tonsillar exudate.      Comments:  Divets in tongue , lips appear slightly swolle  with cheilosis  Eyes:     General: No scleral icterus.       Right eye: No discharge.        Left eye: No discharge.     Conjunctiva/sclera: Conjunctivae normal.  Cardiovascular:     Rate and Rhythm: Normal rate and regular rhythm.     Heart sounds: Normal heart sounds.  Pulmonary:     Effort: Pulmonary effort is normal. No respiratory distress.     Breath sounds: Normal breath sounds.  Musculoskeletal:        General: Normal range of motion.  Skin:    General: Skin is warm and dry.  Neurological:     General: No focal deficit present.     Mental Status: She is alert and oriented to person, place, and time. Mental status is at baseline.  Psychiatric:        Mood and Affect: Mood normal.        Behavior: Behavior normal.        Thought Content: Thought content normal.        Judgment: Judgment normal.    Lab Results  Component Value Date   HGBA1C 5.9 (H) 08/19/2023   HGBA1C 6.1 (H) 06/10/2023   HGBA1C 6.2 (H) 10/13/2022    Lab Results  Component Value Date   CREATININE 0.82 10/13/2022   CREATININE 0.96 03/12/2022   CREATININE 0.71 12/04/2021    Lab Results  Component Value Date   WBC 9.6 08/19/2023   HGB 12.5 08/19/2023   HCT 38.0 08/19/2023   PLT 325 08/19/2023   GLUCOSE 109 (H) 10/13/2022   CHOL 234 (H) 10/13/2022   TRIG 165 (H) 10/13/2022   HDL 48 10/13/2022   LDLDIRECT 159 (H) 10/13/2022   LDLCALC 153 (H) 10/13/2022   ALT 15 10/13/2022   AST 21 10/13/2022   NA 138 10/13/2022   K 3.8 10/13/2022   CL 101 10/13/2022   CREATININE 0.82 10/13/2022   BUN 14 10/13/2022   CO2 29 10/13/2022   TSH 2.52 05/17/2023   HGBA1C 5.9 (H) 08/19/2023    MICROALBUR <0.7 10/25/2022    No results found.  Assessment & Plan:  .Stomatitis and mucositis -     Sedimentation rate; Future -     C-reactive protein; Future -     CBC with Differential/Platelet; Future -     HSV(herpes simplex vrs) 1+2 ab-IgG; Future  Stomatitis Assessment & Plan: May be post viral ,  aggravated by excessive tooth and tongue  brushing .   Nystatin suspension , zinc oxide for cheilosis suspend use  of  current toothpaste.  Checking HSV ,     Other orders -     Zinc Oxide; Apply 1 Application topically 2 (two) times daily as needed for irritation (to lip corners).  Dispense: 56.7 g; Refill: 0 -     Nystatin; Take 5 mLs (500,000 Units total) by mouth 4 (four) times daily.  Dispense: 60 mL; Refill: 0     I spent 34 minutes on the day of this face to face encounter reviewing patient's   prior relevant surgical and non surgical procedures, recent  labs and imaging studies,  reviewing the assessment and plan with patient, and post visit ordering and reviewing of  diagnostics and therapeutics with patient  .   Follow-up: Return in about 1 week (around 08/26/2023).   Sherlene Shams, MD

## 2023-08-20 DIAGNOSIS — K121 Other forms of stomatitis: Secondary | ICD-10-CM | POA: Insufficient documentation

## 2023-08-20 NOTE — Assessment & Plan Note (Addendum)
 May be post viral ,  aggravated by excessive tooth and tongue  brushing .   Nystatin suspension , zinc oxide for cheilosis suspend use  of  current toothpaste.  Checking HSV ,

## 2023-08-23 ENCOUNTER — Other Ambulatory Visit: Payer: Self-pay | Admitting: Cardiovascular Disease

## 2023-08-23 ENCOUNTER — Encounter: Payer: Self-pay | Admitting: Internal Medicine

## 2023-08-23 DIAGNOSIS — E785 Hyperlipidemia, unspecified: Secondary | ICD-10-CM

## 2023-08-23 LAB — HSV(HERPES SIMPLEX VRS) I + II AB-IGG
HSV 1 Glycoprotein G Ab, IgG: REACTIVE — AB
HSV 2 Glycoprotein G Ab, IgG: REACTIVE — AB

## 2023-08-25 ENCOUNTER — Ambulatory Visit: Payer: Medicare HMO | Admitting: Clinical

## 2023-08-25 DIAGNOSIS — F4322 Adjustment disorder with anxiety: Secondary | ICD-10-CM | POA: Diagnosis not present

## 2023-08-25 NOTE — Progress Notes (Signed)
 Time: 2:01 pm- 2:58 pm CPT Code: 16109U-04 Diagnosis: F43.22  Tracey Wiley was seen remotely using secure video conferencing. She was in her home and therapist was in her office at time of appointment. Client is aware of risks of telehealth and consented to a virtual visit. She had seen her primary care doctor, and disclosed having had the flu. Session focused on events that had transpired in a friendship, and as part of this processing Tracey Wiley disclosed more details about her tendency toward excessive shopping as a coping mechanism. Therapist pointed out possible compulsive tendencies and explore functions the behavior may serve. She is scheduled to be seen again in one week.   Treatment Plan Client Abilities/Strengths Tracey Wiley shared that she has participated in therapy in the past and has been very honest in session.  Client Treatment Preferences:  Tracey Wiley prefers in person appointments. She prefers Tuesday and Thursday afternoon appointments. Client Statement of Needs  Tracey Wiley is seeking validation of her emotional responses and overall self-concept. Treatment Level  Weekly   Symptoms Tearfulness, irritability Problems Addressed  Goals Tracey Wiley will reduce symptoms of depression and improve overall mood 1. Tracey Wiley will increase comfort in social situations Objective  Target Date: 01/08/2024 Frequency: Weekly  Progress: 0 Modality: Individual therapy  Objective  Target Date: 01/07/2024 Frequency: Weekly  Progress: 0 Modality: individual therapy  Related Interventions  Tracey Wiley will have opportunities to process her experiences in session  Therapist will point out maladaptive thought and behavior patterns using CBT strategies. Therapist will incorporate behavior activation as appropriate. Therapist will incorporate gradual exposure therapy as appropriate. Therapist will provide referrals for additional resource as appropriate.  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

## 2023-08-26 ENCOUNTER — Ambulatory Visit (INDEPENDENT_AMBULATORY_CARE_PROVIDER_SITE_OTHER): Admitting: Internal Medicine

## 2023-08-26 ENCOUNTER — Encounter: Payer: Self-pay | Admitting: Internal Medicine

## 2023-08-26 VITALS — BP 126/74 | HR 64 | Ht 63.0 in | Wt 221.8 lb

## 2023-08-26 DIAGNOSIS — K121 Other forms of stomatitis: Secondary | ICD-10-CM | POA: Diagnosis not present

## 2023-08-26 DIAGNOSIS — L732 Hidradenitis suppurativa: Secondary | ICD-10-CM | POA: Diagnosis not present

## 2023-08-26 DIAGNOSIS — B002 Herpesviral gingivostomatitis and pharyngotonsillitis: Secondary | ICD-10-CM

## 2023-08-26 MED ORDER — FAMCICLOVIR 500 MG PO TABS: 500.0000 mg | ORAL_TABLET | Freq: Three times a day (TID) | ORAL | 5 refills | Status: DC

## 2023-08-26 NOTE — Patient Instructions (Addendum)
 Increase the muprirocin to every other week to PREVENT RECURRENCE  FAMCICLOVIR SENT TO PHARMACY FOR NEXT OCCURRENCE OF ULCERS

## 2023-08-26 NOTE — Progress Notes (Addendum)
 Subjective:  Patient ID: Tracey Wiley, female    DOB: 11-01-1955  Age: 68 y.o. MRN: [id number]  CC: The primary encounter diagnosis was Stomatitis. Diagnoses of Hidradenitis suppurativa of multiple sites and Primary HSV infection with gingivostomatitis were also pertinent to this visit.   HPI Tracey Wiley presents for  Chief Complaint  Patient presents with   Medical Management of Chronic Issues    1 week follow up    Follow up on multiple issuess  1) stomatitis of uncertain vs mixed etiology :  lips, tongue and palate improved with cessation of excessive tooth brushing and tongue brushing .  Lips are less swollen     2)  Recurrent sinusitis.  Used mupirocin  FEb 9-15 again March 9-15. Resolved the bloody sinus drainage   3) HSV 1 and 2 I IGg checked and very high ;  history of oral, cold sores.   No history of genital sores.  Did not tolerate valacyclovir  taken in the past for cold sores  due to severe headaches   partner  of 14 years. has never had genital ulcers either but has had oral sores.    4) Seeing Quirino Buckles for counselling   5) OA pain;  she was referred for  PT  on Jan 28;  was not contacted on March 18   Vascular referral:  April  15 appt    Outpatient Medications Prior to Visit  Medication Sig Dispense Refill   acetaminophen  (TYLENOL ) 500 MG tablet Take 500 mg by mouth daily as needed for pain.     amoxicillin  (AMOXIL ) 500 MG capsule TAKE 4 TABS 1 HOUR PRIOR TO DENTAL PROCEDURES (Patient not taking: Reported on 09/27/2023) 4 capsule 1   aspirin EC 81 MG tablet Take 81 mg by mouth daily.     cephALEXin  (KEFLEX ) 500 MG capsule Take 1 capsule (500 mg total) by mouth 4 (four) times daily. (Patient not taking: Reported on 09/20/2023) 28 capsule 0   cholecalciferol (VITAMIN D3) 25 MCG (1000 UNIT) tablet Take 1,000 Units by mouth daily.     [employer]S HYDROCORTISONE  ANTI-ITCH 0.5 % APPLY TO THE AFFECTED AREA TWICE A DAY 57 g 11   esomeprazole  (NEXIUM ) 40 MG capsule TAKE 1  CAPSULE (40 MG TOTAL) BY MOUTH DAILY. 30 capsule 0   ezetimibe  (ZETIA ) 10 MG tablet TAKE 1 TABLET BY MOUTH EVERY DAY 90 tablet 0   Flaxseed, Linseed, (FLAX SEED OIL) 1000 MG CAPS Take 1 capsule by mouth 2 (two) times daily.     levalbuterol  (XOPENEX  HFA) 45 MCG/ACT inhaler Inhale 2 puffs into the lungs every 4 (four) hours as needed for wheezing. 15 g 1   magnesium  gluconate (MAGONATE) 500 MG tablet Take 500 mg by mouth at bedtime.     mupirocin  ointment (BACTROBAN ) 2 % Apply 1 Application topically 2 (two) times daily. To nares 22 g 0   Omega-3 Fatty Acids (OMEGA-3 FISH OIL PO) Take 1 capsule by mouth daily.     ondansetron  (ZOFRAN  ODT) 4 MG disintegrating tablet Take 1 tablet (4 mg total) by mouth every 8 (eight) hours as needed for nausea or vomiting. 20 tablet 0   promethazine  (PHENERGAN ) 12.5 MG tablet Take 12.5 mg by mouth every 6 (six) hours as needed for nausea or vomiting.     valACYclovir  (VALTREX ) 1000 MG tablet TAKE 1 TABLET BY MOUTH DAILY FOR SUPPRESSION , OR 5 TIMES DAILY FOR FLARE (Patient not taking: Reported on 09/27/2023) 35 tablet 2   vitamin C (  ASCORBIC ACID) 500 MG tablet Take 1,000 mg by mouth daily.     zinc  gluconate 50 MG tablet Take 50 mg by mouth daily.     zinc  oxide (MEIJER ZINC  OXIDE) 20 % ointment Apply 1 Application topically 2 (two) times daily as needed for irritation (to lip corners). 56.7 g 0   colchicine  0.6 MG tablet Take 1 tablet (0.6 mg total) by mouth 2 (two) times daily as needed. 180 tablet 0   famotidine  (PEPCID ) 20 MG tablet TAKE 1 TABLET BY MOUTH EVERYDAY AT BEDTIME 90 tablet 0   furosemide  (LASIX ) 20 MG tablet Take 1 tablet (20 mg total) by mouth daily as needed. (Patient not taking: Reported on 09/27/2023) 90 tablet 3   ipratropium (ATROVENT ) 0.06 % nasal spray 1 application 1-2 times a day as needed for drainage control. 15 mL 5   MELATONIN PO Take 8 mg by mouth at bedtime. (Patient not taking: Reported on 09/27/2023)     nystatin  (MYCOSTATIN ) 100000  UNIT/ML suspension Take 5 mLs (500,000 Units total) by mouth 4 (four) times daily. (Patient not taking: Reported on 09/20/2023) 60 mL 0   potassium chloride  (KLOR-CON ) 10 MEQ tablet Take 1 tablet (10 mEq total) by mouth daily as needed. (Patient not taking: Reported on 09/27/2023) 90 tablet 3   sotalol  (BETAPACE ) 80 MG tablet TAKE 1 TABLET BY MOUTH TWICE A DAY 180 tablet 3   cetirizine  (ZYRTEC ) 10 MG tablet TAKE 1 TABLET BY MOUTH EVERY DAY 90 tablet 4   Bempedoic Acid  (NEXLETOL ) 180 MG TABS Take 1 tablet (180 mg total) by mouth daily. (Patient not taking: Reported on 08/26/2023) 90 tablet 0   No facility-administered medications prior to visit.    Review of Systems;  Patient denies headache, fevers, malaise, unintentional weight loss, skin rash, eye pain, sinus congestion and sinus pain, sore throat, dysphagia,  hemoptysis , cough, dyspnea, wheezing, chest pain, palpitations, orthopnea, edema, abdominal pain, nausea, melena, diarrhea, constipation, flank pain, dysuria, hematuria, urinary  Frequency, nocturia, numbness, tingling, seizures,  Focal weakness, Loss of consciousness,  Tremor, insomnia, depression, anxiety, and suicidal ideation.      Objective:  BP 126/74   Pulse 64   Ht 5\' 3"  (1.6 m)   Wt 221 lb 12.8 oz (100.6 kg)   SpO2 96%   BMI 39.29 kg/m   BP Readings from Last 3 Encounters:  09/27/23 130/70  09/20/23 121/65  08/26/23 126/74    Wt Readings from Last 3 Encounters:  09/27/23 218 lb 2 oz (98.9 kg)  09/20/23 216 lb 12.8 oz (98.3 kg)  08/26/23 221 lb 12.8 oz (100.6 kg)    Physical Exam Vitals reviewed.  Constitutional:      General: She is not in acute distress.    Appearance: Normal appearance. She is normal weight. She is not ill-appearing, toxic-appearing or diaphoretic.  HENT:     Head: Normocephalic.     Mouth/Throat:     Comments: Lips and Tongue less red Cheilosis improving  Eyes:     General: No scleral icterus.       Right eye: No discharge.         Left eye: No discharge.     Conjunctiva/sclera: Conjunctivae normal.  Cardiovascular:     Rate and Rhythm: Normal rate and regular rhythm.     Heart sounds: Normal heart sounds.  Pulmonary:     Effort: Pulmonary effort is normal. No respiratory distress.     Breath sounds: Normal breath sounds.  Musculoskeletal:  General: Normal range of motion.  Skin:    General: Skin is warm and dry.  Neurological:     General: No focal deficit present.     Mental Status: She is alert and oriented to person, place, and time. Mental status is at baseline.  Psychiatric:        Mood and Affect: Mood normal.        Behavior: Behavior normal.        Thought Content: Thought content normal.        Judgment: Judgment normal.    Lab Results  Component Value Date   HGBA1C 5.9 (H) 08/19/2023   HGBA1C 6.1 (H) 06/10/2023   HGBA1C 6.2 (H) 10/13/2022    Lab Results  Component Value Date   CREATININE 0.82 10/13/2022   CREATININE 0.96 03/12/2022   CREATININE 0.71 12/04/2021    Lab Results  Component Value Date   WBC 9.6 08/19/2023   HGB 12.5 08/19/2023   HCT 38.0 08/19/2023   PLT 325 08/19/2023   GLUCOSE 109 (H) 10/13/2022   CHOL 217 (H) 09/27/2023   TRIG 233 (H) 09/27/2023   HDL 38 (L) 09/27/2023   LDLDIRECT 159 (H) 10/13/2022   LDLCALC 137 (H) 09/27/2023   ALT 15 10/13/2022   AST 21 10/13/2022   NA 138 10/13/2022   K 3.8 10/13/2022   CL 101 10/13/2022   CREATININE 0.82 10/13/2022   BUN 14 10/13/2022   CO2 29 10/13/2022   TSH 2.52 05/17/2023   HGBA1C 5.9 (H) 08/19/2023   MICROALBUR <0.7 10/25/2022    No results found.  Assessment & Plan:  .Stomatitis Assessment & Plan: Improved with cessation of overly aggressive oral hygiene.  HSV 1 and 2 anibody positive status , but no ulcers were noted.    Hidradenitis suppurativa of multiple sites Assessment & Plan: Continue weekly hibiclens .  Increase mupirocin  ointment to nares twice monthly for one week . Has not had to use   Keflex  rx put on file at pharmacy for use if episodes recur   Primary HSV infection with gingivostomatitis Assessment & Plan: Suspect this may be  concurrent. Famciclovir  prescribed    Other orders -     Famciclovir ; Take 1 tablet (500 mg total) by mouth 3 (three) times daily.  Dispense: 21 tablet; Refill: 5     I spent 34 minutes on the day of this face to face encounter reviewing patient's   prior relevant surgical and non surgical procedures, recent  labs and imaging studies, counseling on weight management,  reviewing the assessment and plan with patient, and post visit ordering and reviewing of  diagnostics and therapeutics with patient  .   Follow-up: Return in about 3 months (around 11/26/2023).   Thersia Flax, MD

## 2023-08-28 DIAGNOSIS — B002 Herpesviral gingivostomatitis and pharyngotonsillitis: Secondary | ICD-10-CM | POA: Insufficient documentation

## 2023-08-28 NOTE — Assessment & Plan Note (Signed)
 Improved with cessation of overly aggressive oral hygiene.  HSV 1 and 2 anibody positive status , but no ulcers were noted.

## 2023-08-28 NOTE — Assessment & Plan Note (Signed)
 Continue weekly hibiclens.  Increase mupirocin ointment to nares twice monthly for one week . Has not had to use  Keflex rx put on file at pharmacy for use if episodes recur

## 2023-08-28 NOTE — Assessment & Plan Note (Signed)
 Suspect this may be  concurrent. Famciclovir prescribed

## 2023-09-01 ENCOUNTER — Ambulatory Visit: Payer: Medicare HMO | Admitting: Clinical

## 2023-09-02 ENCOUNTER — Ambulatory Visit (INDEPENDENT_AMBULATORY_CARE_PROVIDER_SITE_OTHER): Admitting: Clinical

## 2023-09-02 DIAGNOSIS — F4322 Adjustment disorder with anxiety: Secondary | ICD-10-CM | POA: Diagnosis not present

## 2023-09-02 NOTE — Progress Notes (Signed)
 Time: 3:01 pm- 4:01 pm CPT Code: 16109U-04 Diagnosis: F43.22  Tracey Wiley was seen remotely using secure video conferencing. She was in her home and therapist was in her office at time of appointment. Client is aware of risks of telehealth and consented to a virtual visit. Session focused on developments that had occurred in a relationship with a friend. She shared details of their communication, and therapist offered validation, support, and communication strategies, as well as an opportunity to process. She is scheduled to be seen again in one week.   Treatment Plan Client Abilities/Strengths Ondria shared that she has participated in therapy in the past and has been very honest in session.  Client Treatment Preferences:  Dell prefers in person appointments. She prefers Tuesday and Thursday afternoon appointments. Client Statement of Needs  Keoni is seeking validation of her emotional responses and overall self-concept. Treatment Level  Weekly   Symptoms Tearfulness, irritability Problems Addressed  Goals Dorothie will reduce symptoms of depression and improve overall mood 1. Kemaria will increase comfort in social situations Objective  Target Date: 01/08/2024 Frequency: Weekly  Progress: 0 Modality: Individual therapy  Objective  Target Date: 01/07/2024 Frequency: Weekly  Progress: 0 Modality: individual therapy  Related Interventions  Hanah will have opportunities to process her experiences in session  Therapist will point out maladaptive thought and behavior patterns using CBT strategies. Therapist will incorporate behavior activation as appropriate. Therapist will incorporate gradual exposure therapy as appropriate. Therapist will provide referrals for additional resource as appropriate.  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

## 2023-09-04 ENCOUNTER — Other Ambulatory Visit: Payer: Self-pay | Admitting: Allergy

## 2023-09-07 ENCOUNTER — Ambulatory Visit

## 2023-09-08 ENCOUNTER — Ambulatory Visit: Payer: Medicare HMO | Admitting: Clinical

## 2023-09-08 DIAGNOSIS — F4322 Adjustment disorder with anxiety: Secondary | ICD-10-CM | POA: Diagnosis not present

## 2023-09-08 NOTE — Progress Notes (Signed)
 Time: 1:01 pm-2:01 pm CPT Code: 16109U-04 Diagnosis: F43.22  Aldine was seen remotely using secure video conferencing. She was in her home and therapist was in her office at time of appointment. Client is aware of risks of telehealth and consented to a virtual visit. Neyra reported several stressful health events since her last session. She also reflected upon the deaths of her mother and father, processing these events in light of the 2nd anniversary of her beloved dog's death. She is scheduled to be seen again in one week.   Treatment Plan Client Abilities/Strengths Ilena shared that she has participated in therapy in the past and has been very honest in session.  Client Treatment Preferences:  Sumner prefers in person appointments. She prefers Tuesday and Thursday afternoon appointments. Client Statement of Needs  Tanja is seeking validation of her emotional responses and overall self-concept. Treatment Level  Weekly   Symptoms Tearfulness, irritability Problems Addressed  Goals Galen will reduce symptoms of depression and improve overall mood 1. Janney will increase comfort in social situations Objective  Target Date: 01/08/2024 Frequency: Weekly  Progress: 0 Modality: Individual therapy  Objective  Target Date: 01/07/2024 Frequency: Weekly  Progress: 0 Modality: individual therapy  Related Interventions  Ivis will have opportunities to process her experiences in session  Therapist will point out maladaptive thought and behavior patterns using CBT strategies. Therapist will incorporate behavior activation as appropriate. Therapist will incorporate gradual exposure therapy as appropriate. Therapist will provide referrals for additional resource as appropriate.  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

## 2023-09-13 ENCOUNTER — Telehealth: Payer: Self-pay | Admitting: Pharmacy Technician

## 2023-09-13 NOTE — Telephone Encounter (Signed)
 Pharmacy Patient Advocate Encounter   Received notification from CoverMyMeds that prior authorization for NEXLETOL is required/requested.KeyMalva Limes) - [id number]   PATIENT SAID ON 08/26/23 SHE IS NOT TAKING NEXLETOL

## 2023-09-15 ENCOUNTER — Ambulatory Visit: Payer: Medicare HMO | Admitting: Clinical

## 2023-09-15 DIAGNOSIS — F4322 Adjustment disorder with anxiety: Secondary | ICD-10-CM | POA: Diagnosis not present

## 2023-09-15 NOTE — Progress Notes (Signed)
 Time: 12:01 pm-1:01 pm CPT Code: 13086V-78 Diagnosis: F43.22  Tracey Wiley was seen remotely using secure video conferencing. She was in her home and therapist was in her office at time of appointment. Client is aware of risks of telehealth and consented to a virtual visit. Tracey Wiley reported upon additional stressful health developments since her last session, and therapist pointed out the likelihood that Tracey Wiley may have experienced medical trauma, and suggested processing in this light. Tracey Wiley reflected upon her medical journey, and therapist offered validation and support. She is scheduled to be seen again in one week.   Treatment Plan Client Abilities/Strengths Tracey Wiley shared that she has participated in therapy in the past and has been very honest in session.  Client Treatment Preferences:  Tracey Wiley prefers in person appointments. She prefers Tuesday and Thursday afternoon appointments. Client Statement of Needs  Tracey Wiley is seeking validation of her emotional responses and overall self-concept. Treatment Level  Weekly   Symptoms Tearfulness, irritability Problems Addressed  Goals Tracey Wiley will reduce symptoms of depression and improve overall mood 1. Tracey Wiley will increase comfort in social situations Objective  Target Date: 01/08/2024 Frequency: Weekly  Progress: 0 Modality: Individual therapy  Objective  Target Date: 01/07/2024 Frequency: Weekly  Progress: 0 Modality: individual therapy  Related Interventions  Tracey Wiley will have opportunities to process her experiences in session  Therapist will point out maladaptive thought and behavior patterns using CBT strategies. Therapist will incorporate behavior activation as appropriate. Therapist will incorporate gradual exposure therapy as appropriate. Therapist will provide referrals for additional resource as appropriate.  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

## 2023-09-19 ENCOUNTER — Ambulatory Visit: Admitting: Clinical

## 2023-09-19 ENCOUNTER — Encounter

## 2023-09-19 DIAGNOSIS — F4322 Adjustment disorder with anxiety: Secondary | ICD-10-CM | POA: Diagnosis not present

## 2023-09-19 NOTE — Progress Notes (Signed)
 Time: 12:01 pm-1:01 pm CPT Code: 14782N-56 Diagnosis: F43.22  Tracey Wiley was seen remotely using secure video conferencing. She was in her home and therapist was in her office at time of appointment. Client is aware of risks of telehealth and consented to a virtual visit. Session focused on continued challenges in caring for her ex-husband. Therapist provided an opportunity to process, encouraged her to weigh pros and cons of several options, and worked with her to identify her own boundaries. She is scheduled to be seen again in one week.   Treatment Plan Client Abilities/Strengths Tracey Wiley shared that she has participated in therapy in the past and has been very honest in session.  Client Treatment Preferences:  Tracey Wiley prefers in person appointments. She prefers Tuesday and Thursday afternoon appointments. Client Statement of Needs  Tracey Wiley is seeking validation of her emotional responses and overall self-concept. Treatment Level  Weekly   Symptoms Tearfulness, irritability Problems Addressed  Goals Tracey Wiley will reduce symptoms of depression and improve overall mood 1. Tracey Wiley will increase comfort in social situations Objective  Target Date: 01/08/2024 Frequency: Weekly  Progress: 0 Modality: Individual therapy  Objective  Target Date: 01/07/2024 Frequency: Weekly  Progress: 0 Modality: individual therapy  Related Interventions  Tracey Wiley will have opportunities to process her experiences in session  Therapist will point out maladaptive thought and behavior patterns using CBT strategies. Therapist will incorporate behavior activation as appropriate. Therapist will incorporate gradual exposure therapy as appropriate. Therapist will provide referrals for additional resource as appropriate.  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

## 2023-09-20 ENCOUNTER — Encounter (INDEPENDENT_AMBULATORY_CARE_PROVIDER_SITE_OTHER): Payer: Self-pay | Admitting: Nurse Practitioner

## 2023-09-20 ENCOUNTER — Ambulatory Visit (INDEPENDENT_AMBULATORY_CARE_PROVIDER_SITE_OTHER): Admitting: Nurse Practitioner

## 2023-09-20 VITALS — BP 121/65 | HR 61 | Resp 18 | Ht 64.0 in | Wt 216.8 lb

## 2023-09-20 DIAGNOSIS — E782 Mixed hyperlipidemia: Secondary | ICD-10-CM | POA: Diagnosis not present

## 2023-09-20 DIAGNOSIS — R600 Localized edema: Secondary | ICD-10-CM | POA: Diagnosis not present

## 2023-09-21 ENCOUNTER — Ambulatory Visit: Attending: Internal Medicine

## 2023-09-21 ENCOUNTER — Encounter (INDEPENDENT_AMBULATORY_CARE_PROVIDER_SITE_OTHER): Payer: Self-pay | Admitting: Nurse Practitioner

## 2023-09-21 ENCOUNTER — Encounter

## 2023-09-21 DIAGNOSIS — M150 Primary generalized (osteo)arthritis: Secondary | ICD-10-CM | POA: Diagnosis present

## 2023-09-21 DIAGNOSIS — M79651 Pain in right thigh: Secondary | ICD-10-CM | POA: Diagnosis present

## 2023-09-21 DIAGNOSIS — M79652 Pain in left thigh: Secondary | ICD-10-CM | POA: Diagnosis present

## 2023-09-21 DIAGNOSIS — R2689 Other abnormalities of gait and mobility: Secondary | ICD-10-CM | POA: Diagnosis present

## 2023-09-21 DIAGNOSIS — M6281 Muscle weakness (generalized): Secondary | ICD-10-CM | POA: Insufficient documentation
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
## 2023-09-24 ENCOUNTER — Other Ambulatory Visit: Payer: Self-pay | Admitting: Internal Medicine

## 2023-09-26 ENCOUNTER — Ambulatory Visit

## 2023-09-26 DIAGNOSIS — M6281 Muscle weakness (generalized): Secondary | ICD-10-CM | POA: Diagnosis not present

## 2023-09-26 DIAGNOSIS — M79651 Pain in right thigh: Secondary | ICD-10-CM

## 2023-09-26 DIAGNOSIS — R2689 Other abnormalities of gait and mobility: Secondary | ICD-10-CM

## 2023-09-26 NOTE — Progress Notes (Unsigned)
 Cardiology Office Note  Date:  09/27/2023   ID:  Tracey Wiley, DOB 1955/11/21, MRN [id number]  PCP:  Thersia Flax, MD   Chief Complaint  Patient presents with   6 month follow up     Patient would like to discuss the medication Nexletol  due to a contraindication with her gout flares.     HPI:  Tracey Wiley is a very pleasant 68 year old woman with a  history of  PFO,  with  closure device in April 2008 performed in [place] ,  hyperlipidemia,  PAT versus paroxysmal atrial fibrillation on sotalol , hypertension,  Bell's palsy gout GERD Cardiac CTA October 2022 calcium  score 0, no significant coronary disease Significant baseline stress, continues to care for her ex-husband who has underlying cognitive issues Myalgia rheumatica/rheumatoid arthritis who presents today for follow-up of her arrhythmia  Last seen by myself in clinic 03/2023  Off maxzide, Developed leg swelling off medication Increased lasix  up to daily And TED hose Leg swelling has resolved Blood pressure 130/80 at home  Did not try bempedoic acid , concerned this could increase her uric acid and trigger gout episode Continues on Zetia  10 daily  Concerned that PMR could cause inflammation to arteries and cause more progressive atherosclerotic process  Echocardiogram October 2024, EF 55 to 60%, no valvular disease,   polymyalgia rheumatica (PMR), followed by rheum Does not need to be on Plaquenil, prednisone  A1C trending down  Total chol 234, LDL 153 On Zetia  10 mg daily Would prefer not to be on PCSK9 inhibitor  Statin myalgias Tried crestor : Reports that she felt "horrible" Did not try repatha , purchase some but left them at room temperature by accident Did not tolerate Mevacor, Zocor   EKG personally reviewed by myself on todays visit EKG Interpretation Date/Time:  Tuesday September 27 2023 15:23:59 EDT Ventricular Rate:  67 PR Interval:  160 QRS Duration:  80 QT Interval:  446 QTC  Calculation: 471 R Axis:   7  Text Interpretation: Normal sinus rhythm When compared with ECG of 05-Apr-2023 11:26, No significant change was found Confirmed by Tracey Wiley [phone]) on 09/27/2023 3:34:03 PM    lost her mother in February 2015.  History of gout in her wrists. Improvement with colchicine . Indomethacin  upset her stomach  PMH:   has a past medical history of Adenomyosis, Anginal pain (HCC), Anxiety, Aortic atherosclerosis (HCC), Arthritis, Asthma, Cervicalgia, Chest pain, atypical, Complication of anesthesia, COVID (07/08/2022), Diastolic dysfunction, DVT (deep venous thrombosis) (HCC), Dyspnea, Esophageal stricture, Esophagitis, Facial paralysis/Bells palsy (10/29/2014), Family history of breast cancer, Family history of colon cancer, Family history of Lynch syndrome, Family history of stomach cancer, FHx: ovarian cancer, Fibroids, Fistula, labyrinthine (1994), Gastritis (11/30/2020), Gastroesophageal reflux disease, Generalized tonic-clonic seizure (HCC) (2002), 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 (2000), Genital herpes, Gout, Headache, History of 2019 novel coronavirus disease (COVID-19) (07/08/2022), History of hiatal hernia, History of pneumonia, Hyperlipidemia, Hypertension, Hypertrophy of breast, IBS (irritable bowel syndrome), Long-term corticosteroid use, Lumbago, Lumbar stenosis, Menopause, Meralgia paresthetica of right side, Obesity, Osteopenia, Ostium secundum type atrial septal defect, Panic attacks, PAT (paroxysmal atrial tachycardia) (HCC), PFO (patent foramen ovale), Polymyalgia rheumatica (HCC), PONV (postoperative nausea and vomiting), Post-concussion vertigo (1994), Postconcussion syndrome, Pre-diabetes, PTSD (post-traumatic stress disorder), Sleep difficulties, Spinal stenosis, lumbar region, without neurogenic claudication, Traumatic brain injury, closed (HCC) (1994), Vertigo, and Vitamin D  deficiency.  PSH:    Past Surgical History:  Procedure Laterality Date   BREAST BIOPSY  Right 06/08/2007   lymphoid and fibroadipose tissue   COLONOSCOPY  08/06/2014  DILATION AND CURETTAGE OF UTERUS     ESOPHAGOGASTRODUODENOSCOPY     HYSTEROSCOPY WITH D & C  01/20/2015   Procedure: DILATATION AND CURETTAGE /HYSTEROSCOPY;  Surgeon: Colan Dash, MD;  Location: [facility];  Service: Gynecology;;   HYSTEROSCOPY WITH D & C N/A 08/06/2022   Procedure: FRACTIONAL DILATATION AND CURETTAGE /HYSTEROSCOPY;  Surgeon: Prescilla Brod, MD;  Location: [facility];  Service: Gynecology;  Laterality: N/A;   INNER EAR SURGERY     x 4   LAPAROSCOPY  01/20/2015   Procedure: LAPAROSCOPY DIAGNOSTIC;  Surgeon: India Mandes Defrancesco, MD;  Location: [facility];  Service: Gynecology;;   NASAL SEPTUM SURGERY     PATENT FORAMEN OVALE CLOSURE  09/06/2006   SINOSCOPY     TMJ x 2     uterine ablation  08/06/2006    Current Outpatient Medications  Medication Sig Dispense Refill   acetaminophen  (TYLENOL ) 500 MG tablet Take 500 mg by mouth daily as needed for pain.     aspirin EC 81 MG tablet Take 81 mg by mouth daily.     cetirizine  (ZYRTEC ) 10 MG tablet TAKE 1 TABLET BY MOUTH EVERY DAY 90 tablet 4   cholecalciferol (VITAMIN D3) 25 MCG (1000 UNIT) tablet Take 1,000 Units by mouth daily.     colchicine  0.6 MG tablet TAKE 1 TABLET (0.6 MG TOTAL) BY MOUTH 2 (TWO) TIMES DAILY AS NEEDED. 120 tablet 0   [facility] HYDROCORTISONE  ANTI-ITCH 0.5 % APPLY TO THE AFFECTED AREA TWICE A DAY 57 g 11   esomeprazole  (NEXIUM ) 40 MG capsule TAKE 1 CAPSULE (40 MG TOTAL) BY MOUTH DAILY. 30 capsule 0   ezetimibe  (ZETIA ) 10 MG tablet TAKE 1 TABLET BY MOUTH EVERY DAY 90 tablet 0   famciclovir  (FAMVIR ) 500 MG tablet Take 1 tablet (500 mg total) by mouth 3 (three) times daily. 21 tablet 5   famotidine  (PEPCID ) 20 MG tablet TAKE 1 TABLET BY MOUTH EVERYDAY AT BEDTIME 90 tablet 0   Flaxseed, Linseed, (FLAX SEED OIL) 1000 MG CAPS Take 1 capsule by mouth 2 (two) times daily.     furosemide  (LASIX ) 20 MG tablet Take 1 tablet by mouth  daily.     ipratropium (ATROVENT ) 0.06 % nasal spray USE 1 SPRAY IN EACH NOSTRIL 1-2 TIMES DAILY AS NEEDED TO CONTROL DRAINAGE 45 mL 0   levalbuterol  (XOPENEX  HFA) 45 MCG/ACT inhaler Inhale 2 puffs into the lungs every 4 (four) hours as needed for wheezing. 15 g 1   magnesium  gluconate (MAGONATE) 500 MG tablet Take 500 mg by mouth at bedtime.     MELATONIN ER PO Take 6 mg by mouth at bedtime.     mupirocin  ointment (BACTROBAN ) 2 % Apply 1 Application topically 2 (two) times daily. To nares 22 g 0   Omega-3 Fatty Acids (OMEGA-3 FISH OIL PO) Take 1 capsule by mouth daily.     ondansetron  (ZOFRAN  ODT) 4 MG disintegrating tablet Take 1 tablet (4 mg total) by mouth every 8 (eight) hours as needed for nausea or vomiting. 20 tablet 0   potassium chloride  (KLOR-CON ) 10 MEQ tablet Take 10 mEq by mouth daily.     promethazine  (PHENERGAN ) 12.5 MG tablet Take 12.5 mg by mouth every 6 (six) hours as needed for nausea or vomiting.     sotalol  (BETAPACE ) 80 MG tablet TAKE 1 TABLET BY MOUTH TWICE A DAY 180 tablet 3   vitamin C (ASCORBIC ACID) 500 MG tablet Take 1,000 mg by mouth daily.     zinc   gluconate 50 MG tablet Take 50 mg by mouth daily.     zinc  oxide (MEIJER ZINC  OXIDE) 20 % ointment Apply 1 Application topically 2 (two) times daily as needed for irritation (to lip corners). 56.7 g 0   amoxicillin  (AMOXIL ) 500 MG capsule TAKE 4 TABS 1 HOUR PRIOR TO DENTAL PROCEDURES (Patient not taking: Reported on 09/27/2023) 4 capsule 1   cephALEXin  (KEFLEX ) 500 MG capsule Take 1 capsule (500 mg total) by mouth 4 (four) times daily. (Patient not taking: Reported on 09/20/2023) 28 capsule 0   valACYclovir  (VALTREX ) 1000 MG tablet TAKE 1 TABLET BY MOUTH DAILY FOR SUPPRESSION , OR 5 TIMES DAILY FOR FLARE (Patient not taking: Reported on 09/27/2023) 35 tablet 2   No current facility-administered medications for this visit.    Allergies:   2,4-d dimethylamine; Codeine; Hydrocodone-acetaminophen ; Morphine; Nitrofurantoin  monohyd macro; Ciprofloxacin; Erythromycin base; Hydrocodone; Oxycodone ; Pamelor [nortriptyline hcl]; Stadol [butorphanol]; Talwin [pentazocine]; Topamax [topiramate]; Wellbutrin [bupropion]; Zanaflex [tizanidine hcl]; Lamictal [lamotrigine]; and Macrobid [nitrofurantoin macrocrystal]   Social History:  The patient  reports that she has never smoked. She has never used smokeless tobacco. She reports that she does not drink alcohol and does not use drugs.   Family History:   family history includes Allergic rhinitis in her mother; Asthma in her sister and sister; Breast cancer in her paternal aunt and paternal aunt; Cancer (age of onset: 15) in her mother; Colon cancer (age of onset: 59) in her sister; Eczema in her brother and father; Heart failure in her father; Hyperlipidemia in her mother; Hypertension in her mother; Hypothyroidism in her mother; Kidney disease in her mother; Ovarian cancer in an other family member; Stomach cancer in her maternal grandmother.    Review of Systems: Review of Systems  Constitutional: Negative.   HENT: Negative.    Respiratory: Negative.    Cardiovascular:  Positive for chest pain.  Gastrointestinal: Negative.   Musculoskeletal: Negative.   Neurological: Negative.   Psychiatric/Behavioral: Negative.    All other systems reviewed and are negative.   PHYSICAL EXAM: VS:  BP 130/70 (BP Location: Left Arm, Patient Position: Sitting, Cuff Size: Normal)   Pulse 67   Ht 5\' 4"  (1.626 m)   Wt 218 lb 2 oz (98.9 kg)   SpO2 98%   BMI 37.44 kg/m  , BMI Body mass index is 37.44 kg/m. Constitutional:  oriented to person, place, and time. No distress.  HENT:  Head: Grossly normal Eyes:  no discharge. No scleral icterus.  Neck: No JVD, no carotid bruits  Cardiovascular: Regular rate and rhythm, no murmurs appreciated Pulmonary/Chest: Clear to auscultation bilaterally, no wheezes or rails Abdominal: Soft.  no distension.  no tenderness.  Musculoskeletal: Normal  range of motion Neurological:  normal muscle tone. Coordination normal. No atrophy Skin: Skin warm and dry Psychiatric: normal affect, pleasant  Recent Labs: 10/13/2022: ALT 15; BUN 14; Creatinine, Ser 0.82; Potassium 3.8; Sodium 138 10/25/2022: Pro B Natriuretic peptide (BNP) 111.0 05/17/2023: TSH 2.52 08/19/2023: Hemoglobin 12.5; Platelets 325    Lipid Panel Lab Results  Component Value Date   CHOL 234 (H) 10/13/2022   HDL 48 10/13/2022   LDLCALC 153 (H) 10/13/2022   TRIG 165 (H) 10/13/2022     Wt Readings from Last 3 Encounters:  09/27/23 218 lb 2 oz (98.9 kg)  09/20/23 216 lb 12.8 oz (98.3 kg)  08/26/23 221 lb 12.8 oz (100.6 kg)     ASSESSMENT AND PLAN:  SVT/ PSVT/ PAT  on sotalol  80 mg twice  daily Symptoms relatively well-controlled  Chest pains prior cardiac CTA no significant disease Mild calcium  near the ostium of the RCA No further ischemic workup needed  Mixed hyperlipidemia - Plan: EKG 12-Lead statin myalgia Continue Zetia , previously preferred not to be on Repatha  Concerned about bempedoic acid  causing elevated uric acid - Recommend she try Livalo  1 mg every other day with slow titration upwards as tolerated  Polymyalgia rheumatica,  Elevated sed rate and CRP, followed by rheumatology  Obesity (BMI 30-39.9) - Plan: EKG 12-Lead We have encouraged continued exercise  Bilateral lower extremity edema - Plan: EKG 12-Lead Leg swelling resolved on Lasix  daily with compression hose  Leg pain Stable  Prolonged QT interval QTc is stable on today's visit Normal EKG    Orders Placed This Encounter  Procedures   EKG 12-Lead     Signed, Juanda Noon, M.D., Ph.D. 09/27/2023  [facility] [place], [place] [phone]
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
## 2023-09-27 ENCOUNTER — Encounter: Payer: Self-pay | Admitting: Cardiovascular Disease

## 2023-09-27 ENCOUNTER — Ambulatory Visit: Payer: Medicare HMO | Attending: Cardiovascular Disease | Admitting: Cardiovascular Disease

## 2023-09-27 VITALS — BP 130/70 | HR 67 | Ht 64.0 in | Wt 218.1 lb

## 2023-09-27 DIAGNOSIS — R0600 Dyspnea, unspecified: Secondary | ICD-10-CM | POA: Diagnosis not present

## 2023-09-27 DIAGNOSIS — R600 Localized edema: Secondary | ICD-10-CM

## 2023-09-27 DIAGNOSIS — E785 Hyperlipidemia, unspecified: Secondary | ICD-10-CM | POA: Diagnosis not present

## 2023-09-27 DIAGNOSIS — R7303 Prediabetes: Secondary | ICD-10-CM

## 2023-09-27 DIAGNOSIS — M069 Rheumatoid arthritis, unspecified: Secondary | ICD-10-CM

## 2023-09-27 DIAGNOSIS — M791 Myalgia, unspecified site: Secondary | ICD-10-CM

## 2023-09-27 DIAGNOSIS — I10 Essential (primary) hypertension: Secondary | ICD-10-CM

## 2023-09-27 DIAGNOSIS — M353 Polymyalgia rheumatica: Secondary | ICD-10-CM

## 2023-09-27 DIAGNOSIS — I471 Supraventricular tachycardia, unspecified: Secondary | ICD-10-CM

## 2023-09-27 DIAGNOSIS — T466X5D Adverse effect of antihyperlipidemic and antiarteriosclerotic drugs, subsequent encounter: Secondary | ICD-10-CM

## 2023-09-27 MED ORDER — PITAVASTATIN CALCIUM 2 MG PO TABS: 2.0000 mg | ORAL_TABLET | Freq: Every day | ORAL | 6 refills | Status: DC

## 2023-09-27 NOTE — Patient Instructions (Addendum)
 Medication Instructions:  Please start livalo  1/2 pill every other day for a few weeks, Slow titration upwards as tolerated to whole pill  If you need a refill on your cardiac medications before your next appointment, please call your pharmacy.   Lab work: Lipids today  Testing/Procedures: No new testing needed  Follow-Up: At [facility], you and your health needs are our priority.  As part of our continuing mission to provide you with exceptional heart care, we have created designated Provider Care Teams.  These Care Teams include your primary Cardiologist (physician) and Advanced Practice Providers (APPs -  Physician Assistants and Nurse Practitioners) who all work together to provide you with the care you need, when you need it.  You will need a follow up appointment in 12 months  Providers on your designated Care Team:   Laneta Pintos, NP Varney Gentleman, PA-C Cadence Gennaro Khat, [place]  COVID-19 Vaccine Information can be found at: [email or web address] For questions related to vaccine distribution or appointments, please email [email or web address] or call [phone].

## 2023-09-28 ENCOUNTER — Ambulatory Visit

## 2023-09-28 DIAGNOSIS — R2689 Other abnormalities of gait and mobility: Secondary | ICD-10-CM

## 2023-09-28 DIAGNOSIS — M6281 Muscle weakness (generalized): Secondary | ICD-10-CM

## 2023-09-28 DIAGNOSIS — M79651 Pain in right thigh: Secondary | ICD-10-CM

## 2023-09-28 LAB — LIPID PANEL
Chol/HDL Ratio: 5.7 ratio — ABNORMAL HIGH (ref 0.0–4.4)
Cholesterol, Total: 217 mg/dL — ABNORMAL HIGH (ref 100–199)
HDL: 38 mg/dL — ABNORMAL LOW (ref >39)
LDL Chol Calc (NIH): 137 mg/dL — ABNORMAL HIGH (ref 0–99)
Triglycerides: 233 mg/dL — ABNORMAL HIGH (ref 0–149)
VLDL Cholesterol Cal: 42 mg/dL — ABNORMAL HIGH (ref 5–40)
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
## 2023-09-29 ENCOUNTER — Ambulatory Visit: Payer: Medicare HMO | Admitting: Clinical

## 2023-09-29 ENCOUNTER — Other Ambulatory Visit: Payer: Self-pay | Admitting: Physician Assistant

## 2023-09-29 ENCOUNTER — Other Ambulatory Visit: Payer: Self-pay | Admitting: Cardiovascular Disease

## 2023-09-29 DIAGNOSIS — F4322 Adjustment disorder with anxiety: Secondary | ICD-10-CM

## 2023-09-29 NOTE — Progress Notes (Signed)
 Time: 12:01 pm-1:01 pm CPT Code: [id number] Diagnosis: F43.22  Audryna was seen remotely using secure video conferencing. She was in her home and therapist was in her office at time of appointment. Client is aware of risks of telehealth and consented to a virtual visit. Session focused on stressors that had arisen in the past week, especially with regard to her ex-husband, including his current health situation and plans for his future. Therapist provided an opportunity to process, including consideration of Elinor's relationship with him prior to his dementia. She is scheduled to be seen again in one week.   Treatment Plan Client Abilities/Strengths Aanyah shared that she has participated in therapy in the past and has been very honest in session.  Client Treatment Preferences:  Keir prefers in person appointments. She prefers Tuesday and Thursday afternoon appointments. Client Statement of Needs  Ashanta is seeking validation of her emotional responses and overall self-concept. Treatment Level  Weekly   Symptoms Tearfulness, irritability Problems Addressed  Goals Fantasha will reduce symptoms of depression and improve overall mood 1. Javonda will increase comfort in social situations Objective  Target Date: 01/08/2024 Frequency: Weekly  Progress: 0 Modality: Individual therapy  Objective  Target Date: 01/07/2024 Frequency: Weekly  Progress: 0 Modality: individual therapy  Related Interventions  Vaani will have opportunities to process her experiences in session  Therapist will point out maladaptive thought and behavior patterns using CBT strategies. Therapist will incorporate behavior activation as appropriate. Therapist will incorporate gradual exposure therapy as appropriate. Therapist will provide referrals for additional resource as appropriate.  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

## 2023-09-30 ENCOUNTER — Encounter: Payer: Self-pay | Admitting: Cardiovascular Disease

## 2023-10-03 ENCOUNTER — Ambulatory Visit

## 2023-10-04 ENCOUNTER — Telehealth: Payer: Self-pay | Admitting: Internal Medicine

## 2023-10-04 NOTE — Telephone Encounter (Signed)
 Patient need lab orders.

## 2023-10-05 ENCOUNTER — Ambulatory Visit

## 2023-10-05 DIAGNOSIS — M6281 Muscle weakness (generalized): Secondary | ICD-10-CM

## 2023-10-05 DIAGNOSIS — R2689 Other abnormalities of gait and mobility: Secondary | ICD-10-CM

## 2023-10-05 DIAGNOSIS — M79651 Pain in right thigh: Secondary | ICD-10-CM
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
## 2023-10-06 ENCOUNTER — Ambulatory Visit: Payer: Medicare HMO | Admitting: Clinical

## 2023-10-06 DIAGNOSIS — F4322 Adjustment disorder with anxiety: Secondary | ICD-10-CM | POA: Diagnosis not present

## 2023-10-06 NOTE — Progress Notes (Signed)
 Time: 12:01 pm-12:58 pm CPT Code: 16109U-04 Diagnosis: F43.22  Tracey Wiley was seen remotely using secure video conferencing. She was in her home and therapist was in her office at time of appointment. Client is aware of risks of telehealth and consented to a virtual visit. Session focused on ongoing challenges in caring for her ex-husband, including a visit to his neurologist during which she had learned his functioning had declined significantly. She is currently exploring options for support in his care. Therapist suggested resources, as well as communication strategies to assist with arranging care. She is scheduled to be seen again in one week.   Treatment Plan Client Abilities/Strengths Tracey Wiley shared that she has participated in therapy in the past and has been very honest in session.  Client Treatment Preferences:  Tracey Wiley prefers in person appointments. She prefers Tuesday and Thursday afternoon appointments. Client Statement of Needs  Tracey Wiley is seeking validation of her emotional responses and overall self-concept. Treatment Level  Weekly   Symptoms Tearfulness, irritability Problems Addressed  Goals Tracey Wiley will reduce symptoms of depression and improve overall mood 1. Tracey Wiley will increase comfort in social situations Objective  Target Date: 01/08/2024 Frequency: Weekly  Progress: 0 Modality: Individual therapy  Objective  Target Date: 01/07/2024 Frequency: Weekly  Progress: 0 Modality: individual therapy  Related Interventions  Tracey Wiley will have opportunities to process her experiences in session  Therapist will point out maladaptive thought and behavior patterns using CBT strategies. Therapist will incorporate behavior activation as appropriate. Therapist will incorporate gradual exposure therapy as appropriate. Therapist will provide referrals for additional resource as appropriate.  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

## 2023-10-07 ENCOUNTER — Other Ambulatory Visit: Payer: Self-pay | Admitting: Internal Medicine

## 2023-10-07 ENCOUNTER — Telehealth: Payer: Self-pay | Admitting: Internal Medicine

## 2023-10-07 ENCOUNTER — Other Ambulatory Visit (INDEPENDENT_AMBULATORY_CARE_PROVIDER_SITE_OTHER): Payer: Medicare HMO

## 2023-10-07 DIAGNOSIS — E785 Hyperlipidemia, unspecified: Secondary | ICD-10-CM | POA: Diagnosis not present

## 2023-10-07 LAB — COMPREHENSIVE METABOLIC PANEL WITH GFR
ALT: 11 U/L (ref 0–35)
AST: 19 U/L (ref 0–37)
Albumin: 3.9 g/dL (ref 3.5–5.2)
Alkaline Phosphatase: 73 U/L (ref 39–117)
BUN: 16 mg/dL (ref 6–23)
CO2: 30 meq/L (ref 19–32)
Calcium: 8.9 mg/dL (ref 8.4–10.5)
Chloride: 102 meq/L (ref 96–112)
Creatinine, Ser: 0.76 mg/dL (ref 0.40–1.20)
GFR: 82.04 mL/min (ref >60.00)
Glucose, Bld: 107 mg/dL — ABNORMAL HIGH (ref 70–99)
Potassium: 4.3 meq/L (ref 3.5–5.1)
Sodium: 139 meq/L (ref 135–145)
Total Bilirubin: 0.6 mg/dL (ref 0.2–1.2)
Total Protein: 7.4 g/dL (ref 6.0–8.3)

## 2023-10-07 NOTE — Telephone Encounter (Signed)
 Patient need lab orders.

## 2023-10-09 ENCOUNTER — Encounter: Payer: Self-pay | Admitting: Internal Medicine

## 2023-10-10 ENCOUNTER — Ambulatory Visit: Attending: Internal Medicine

## 2023-10-10 DIAGNOSIS — M150 Primary generalized (osteo)arthritis: Secondary | ICD-10-CM | POA: Insufficient documentation

## 2023-10-10 DIAGNOSIS — R2689 Other abnormalities of gait and mobility: Secondary | ICD-10-CM | POA: Diagnosis present

## 2023-10-10 DIAGNOSIS — M79651 Pain in right thigh: Secondary | ICD-10-CM | POA: Insufficient documentation

## 2023-10-10 DIAGNOSIS — M79652 Pain in left thigh: Secondary | ICD-10-CM | POA: Insufficient documentation

## 2023-10-10 DIAGNOSIS — M6281 Muscle weakness (generalized): Secondary | ICD-10-CM | POA: Insufficient documentation
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
## 2023-10-11 ENCOUNTER — Encounter

## 2023-10-12 ENCOUNTER — Ambulatory Visit

## 2023-10-12 DIAGNOSIS — R2689 Other abnormalities of gait and mobility: Secondary | ICD-10-CM

## 2023-10-12 DIAGNOSIS — M6281 Muscle weakness (generalized): Secondary | ICD-10-CM | POA: Diagnosis not present

## 2023-10-12 DIAGNOSIS — M79651 Pain in right thigh: Secondary | ICD-10-CM

## 2023-10-12 DIAGNOSIS — M150 Primary generalized (osteo)arthritis: Secondary | ICD-10-CM
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
## 2023-10-13 ENCOUNTER — Encounter

## 2023-10-13 ENCOUNTER — Ambulatory Visit (INDEPENDENT_AMBULATORY_CARE_PROVIDER_SITE_OTHER): Payer: Medicare HMO | Admitting: Clinical

## 2023-10-13 DIAGNOSIS — F4322 Adjustment disorder with anxiety: Secondary | ICD-10-CM | POA: Diagnosis not present
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
## 2023-10-17 ENCOUNTER — Ambulatory Visit

## 2023-10-17 DIAGNOSIS — M79651 Pain in right thigh: Secondary | ICD-10-CM

## 2023-10-17 DIAGNOSIS — M6281 Muscle weakness (generalized): Secondary | ICD-10-CM | POA: Diagnosis not present

## 2023-10-17 DIAGNOSIS — R2689 Other abnormalities of gait and mobility: Secondary | ICD-10-CM

## 2023-10-17 DIAGNOSIS — M150 Primary generalized (osteo)arthritis: Secondary | ICD-10-CM
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
## 2023-10-19 ENCOUNTER — Ambulatory Visit

## 2023-10-19 DIAGNOSIS — M6281 Muscle weakness (generalized): Secondary | ICD-10-CM | POA: Diagnosis not present

## 2023-10-19 DIAGNOSIS — M150 Primary generalized (osteo)arthritis: Secondary | ICD-10-CM

## 2023-10-19 DIAGNOSIS — R2689 Other abnormalities of gait and mobility: Secondary | ICD-10-CM

## 2023-10-19 DIAGNOSIS — M79651 Pain in right thigh: Secondary | ICD-10-CM
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
## 2023-10-20 ENCOUNTER — Encounter

## 2023-10-20 ENCOUNTER — Other Ambulatory Visit: Payer: Self-pay | Admitting: Cardiovascular Disease

## 2023-10-20 ENCOUNTER — Ambulatory Visit (INDEPENDENT_AMBULATORY_CARE_PROVIDER_SITE_OTHER): Payer: Medicare HMO | Admitting: Clinical

## 2023-10-20 DIAGNOSIS — F4322 Adjustment disorder with anxiety: Secondary | ICD-10-CM | POA: Diagnosis not present

## 2023-10-20 NOTE — Progress Notes (Signed)
 Time: 12:01 pm-12:58 pm CPT Code: [id number]-04 Diagnosis: F43.22  Tracey Wiley was seen remotely using secure video conferencing. She was in her home and therapist was in her office at time of appointment. Client is aware of risks of telehealth and consented to a virtual visit. Session focused stressors that had arisen in the care of her ex-husband, and feelings of anger that had been triggered. Therapist offered validation and support, and suggested strategies to help Tracey Wiley manage his care while minimizing the emotional impact on her when possible. She is scheduled to be seen again in two weeks.   Treatment Plan Client Abilities/Strengths Tracey Wiley shared that she has participated in therapy in the past and has been very honest in session.  Client Treatment Preferences:  Tracey Wiley prefers in person appointments. She prefers Tuesday and Thursday afternoon appointments. Client Statement of Needs  Tracey Wiley is seeking validation of her emotional responses and overall self-concept. Treatment Level  Weekly   Symptoms Tearfulness, irritability Problems Addressed  Goals Tracey Wiley will reduce symptoms of depression and improve overall mood 1. Tracey Wiley will increase comfort in social situations Objective  Target Date: 01/08/2024 Frequency: Weekly  Progress: 0 Modality: Individual therapy  Objective  Target Date: 01/07/2024 Frequency: Weekly  Progress: 0 Modality: individual therapy  Related Interventions  Tracey Wiley will have opportunities to process her experiences in session  Therapist will point out maladaptive thought and behavior patterns using CBT strategies. Therapist will incorporate behavior activation as appropriate. Therapist will incorporate gradual exposure therapy as appropriate. Therapist will provide referrals for additional resource as appropriate.  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

## 2023-10-24 ENCOUNTER — Ambulatory Visit

## 2023-10-24 ENCOUNTER — Other Ambulatory Visit (INDEPENDENT_AMBULATORY_CARE_PROVIDER_SITE_OTHER): Payer: Self-pay | Admitting: Nurse Practitioner

## 2023-10-24 DIAGNOSIS — R600 Localized edema: Secondary | ICD-10-CM

## 2023-10-24 DIAGNOSIS — M150 Primary generalized (osteo)arthritis: Secondary | ICD-10-CM

## 2023-10-24 DIAGNOSIS — R2689 Other abnormalities of gait and mobility: Secondary | ICD-10-CM

## 2023-10-24 DIAGNOSIS — M6281 Muscle weakness (generalized): Secondary | ICD-10-CM

## 2023-10-24 DIAGNOSIS — M79651 Pain in right thigh: Secondary | ICD-10-CM
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
## 2023-10-25 ENCOUNTER — Encounter (INDEPENDENT_AMBULATORY_CARE_PROVIDER_SITE_OTHER): Payer: Self-pay

## 2023-10-25 ENCOUNTER — Encounter

## 2023-10-26 ENCOUNTER — Ambulatory Visit (INDEPENDENT_AMBULATORY_CARE_PROVIDER_SITE_OTHER): Admitting: Nurse Practitioner

## 2023-10-26 ENCOUNTER — Encounter (INDEPENDENT_AMBULATORY_CARE_PROVIDER_SITE_OTHER): Payer: Self-pay | Admitting: Nurse Practitioner

## 2023-10-26 ENCOUNTER — Ambulatory Visit (INDEPENDENT_AMBULATORY_CARE_PROVIDER_SITE_OTHER)

## 2023-10-26 ENCOUNTER — Encounter

## 2023-10-26 VITALS — BP 133/76 | HR 62 | Resp 16 | Wt 217.0 lb

## 2023-10-26 DIAGNOSIS — R600 Localized edema: Secondary | ICD-10-CM | POA: Diagnosis not present

## 2023-10-26 DIAGNOSIS — E782 Mixed hyperlipidemia: Secondary | ICD-10-CM | POA: Diagnosis not present

## 2023-10-27 ENCOUNTER — Encounter

## 2023-11-01 ENCOUNTER — Encounter

## 2023-11-01 ENCOUNTER — Encounter (INDEPENDENT_AMBULATORY_CARE_PROVIDER_SITE_OTHER): Payer: Self-pay | Admitting: Nurse Practitioner
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
## 2023-11-02 ENCOUNTER — Other Ambulatory Visit: Payer: Self-pay | Admitting: Cardiovascular Disease

## 2023-11-02 ENCOUNTER — Ambulatory Visit

## 2023-11-02 DIAGNOSIS — M6281 Muscle weakness (generalized): Secondary | ICD-10-CM

## 2023-11-02 DIAGNOSIS — M150 Primary generalized (osteo)arthritis: Secondary | ICD-10-CM

## 2023-11-02 DIAGNOSIS — I5189 Other ill-defined heart diseases: Secondary | ICD-10-CM

## 2023-11-02 DIAGNOSIS — E785 Hyperlipidemia, unspecified: Secondary | ICD-10-CM

## 2023-11-02 DIAGNOSIS — M79651 Pain in right thigh: Secondary | ICD-10-CM
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
## 2023-11-03 ENCOUNTER — Ambulatory Visit (INDEPENDENT_AMBULATORY_CARE_PROVIDER_SITE_OTHER): Payer: Medicare HMO | Admitting: Clinical

## 2023-11-03 ENCOUNTER — Encounter

## 2023-11-03 DIAGNOSIS — F4322 Adjustment disorder with anxiety: Secondary | ICD-10-CM
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
## 2023-11-07 ENCOUNTER — Ambulatory Visit: Attending: Internal Medicine

## 2023-11-07 DIAGNOSIS — M79652 Pain in left thigh: Secondary | ICD-10-CM | POA: Insufficient documentation

## 2023-11-07 DIAGNOSIS — M79651 Pain in right thigh: Secondary | ICD-10-CM | POA: Insufficient documentation

## 2023-11-07 DIAGNOSIS — R2689 Other abnormalities of gait and mobility: Secondary | ICD-10-CM | POA: Diagnosis present

## 2023-11-07 DIAGNOSIS — M150 Primary generalized (osteo)arthritis: Secondary | ICD-10-CM | POA: Insufficient documentation

## 2023-11-07 DIAGNOSIS — M6281 Muscle weakness (generalized): Secondary | ICD-10-CM | POA: Insufficient documentation
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
## 2023-11-09 ENCOUNTER — Ambulatory Visit

## 2023-11-09 DIAGNOSIS — M150 Primary generalized (osteo)arthritis: Secondary | ICD-10-CM

## 2023-11-09 DIAGNOSIS — M6281 Muscle weakness (generalized): Secondary | ICD-10-CM | POA: Diagnosis not present

## 2023-11-09 DIAGNOSIS — M79651 Pain in right thigh: Secondary | ICD-10-CM

## 2023-11-09 DIAGNOSIS — R2689 Other abnormalities of gait and mobility: Secondary | ICD-10-CM
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
## 2023-11-10 ENCOUNTER — Encounter

## 2023-11-10 ENCOUNTER — Ambulatory Visit (INDEPENDENT_AMBULATORY_CARE_PROVIDER_SITE_OTHER): Admitting: Clinical

## 2023-11-10 DIAGNOSIS — F4322 Adjustment disorder with anxiety: Secondary | ICD-10-CM

## 2023-11-10 NOTE — Progress Notes (Signed)
 Time: 12:01 pm-12:58 pm CPT Code: 16109U-04 Diagnosis: F43.22  Tracey Wiley was seen remotely using secure video conferencing. She was in her home and therapist was in her office at time of appointment. Client is aware of risks of telehealth and consented to a virtual visit.  Session focused on continued discussion of managing her ex-husband's care, including developments in his care team. Therapist encouraged Tracey Wiley to explore her emotional responses to the idea of shifting his care plan, and encouraged her to consider what might be the result of her own codependent tendencies, versus her ex-husband's needs. She is scheduled to be seen again in one week.  Treatment Plan Client Abilities/Strengths Tracey Wiley shared that she has participated in therapy in the past and has been very honest in session.  Client Treatment Preferences:  Tracey Wiley prefers in person appointments. She prefers Tuesday and Thursday afternoon appointments. Client Statement of Needs  Tracey Wiley is seeking validation of her emotional responses and overall self-concept. Treatment Level  Weekly   Symptoms Tearfulness, irritability Problems Addressed  Goals Tracey Wiley will reduce symptoms of depression and improve overall mood 1. Tracey Wiley will increase comfort in social situations Objective  Target Date: 01/08/2024 Frequency: Weekly  Progress: 0 Modality: Individual therapy  Objective  Target Date: 01/07/2024 Frequency: Weekly  Progress: 0 Modality: individual therapy  Related Interventions  Tracey Wiley will have opportunities to process her experiences in session  Therapist will point out maladaptive thought and behavior patterns using CBT strategies. Therapist will incorporate behavior activation as appropriate. Therapist will incorporate gradual exposure therapy as appropriate. Therapist will provide referrals for additional resource as appropriate.  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

## 2023-11-16 ENCOUNTER — Ambulatory Visit

## 2023-11-16 DIAGNOSIS — R2689 Other abnormalities of gait and mobility: Secondary | ICD-10-CM

## 2023-11-16 DIAGNOSIS — M150 Primary generalized (osteo)arthritis: Secondary | ICD-10-CM

## 2023-11-16 DIAGNOSIS — M79651 Pain in right thigh: Secondary | ICD-10-CM

## 2023-11-16 DIAGNOSIS — M6281 Muscle weakness (generalized): Secondary | ICD-10-CM | POA: Diagnosis not present
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
## 2023-11-17 ENCOUNTER — Ambulatory Visit: Admitting: Clinical

## 2023-11-17 DIAGNOSIS — F4322 Adjustment disorder with anxiety: Secondary | ICD-10-CM | POA: Diagnosis not present

## 2023-11-17 NOTE — Progress Notes (Signed)
 Time: 12:01 pm-12:58 pm CPT Code: 16109U-04 Diagnosis: F43.22  Tracey Wiley was seen remotely using secure video conferencing. She was in her home and therapist was in her office at time of appointment. Client is aware of risks of telehealth and consented to a virtual visit.  Session focused on processing events from her past and their relation to her current tendencies, including to take care of others. Therapist pointed out that she may experience a sense of empowerment from attempting to help others, and this appeared to resonate with her. She is scheduled to be seen again in one week.  Treatment Plan Client Abilities/Strengths Tracey Wiley shared that she has participated in therapy in the past and has been very honest in session.  Client Treatment Preferences:  Tracey Wiley prefers in person appointments. She prefers Tuesday and Thursday afternoon appointments. Client Statement of Needs  Tracey Wiley is seeking validation of her emotional responses and overall self-concept. Treatment Level  Weekly   Symptoms Tearfulness, irritability Problems Addressed  Goals Tracey Wiley will reduce symptoms of depression and improve overall mood 1. Tracey Wiley will increase comfort in social situations Objective  Target Date: 01/08/2024 Frequency: Weekly  Progress: 0 Modality: Individual therapy  Objective  Target Date: 01/07/2024 Frequency: Weekly  Progress: 0 Modality: individual therapy  Related Interventions  Tracey Wiley will have opportunities to process her experiences in session  Therapist will point out maladaptive thought and behavior patterns using CBT strategies. Therapist will incorporate behavior activation as appropriate. Therapist will incorporate gradual exposure therapy as appropriate. Therapist will provide referrals for additional resource as appropriate.  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

## 2023-11-19 ENCOUNTER — Other Ambulatory Visit: Payer: Self-pay | Admitting: Internal Medicine

## 2023-11-21 ENCOUNTER — Ambulatory Visit

## 2023-11-21 DIAGNOSIS — R2689 Other abnormalities of gait and mobility: Secondary | ICD-10-CM

## 2023-11-21 DIAGNOSIS — M150 Primary generalized (osteo)arthritis: Secondary | ICD-10-CM

## 2023-11-21 DIAGNOSIS — M6281 Muscle weakness (generalized): Secondary | ICD-10-CM

## 2023-11-21 DIAGNOSIS — M79651 Pain in right thigh: Secondary | ICD-10-CM
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
## 2023-11-23 ENCOUNTER — Ambulatory Visit

## 2023-11-23 DIAGNOSIS — R2689 Other abnormalities of gait and mobility: Secondary | ICD-10-CM

## 2023-11-23 DIAGNOSIS — M6281 Muscle weakness (generalized): Secondary | ICD-10-CM

## 2023-11-23 DIAGNOSIS — M79651 Pain in right thigh: Secondary | ICD-10-CM

## 2023-11-23 DIAGNOSIS — M150 Primary generalized (osteo)arthritis: Secondary | ICD-10-CM
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
## 2023-11-24 ENCOUNTER — Ambulatory Visit (INDEPENDENT_AMBULATORY_CARE_PROVIDER_SITE_OTHER): Admitting: Clinical

## 2023-11-24 DIAGNOSIS — F4322 Adjustment disorder with anxiety: Secondary | ICD-10-CM | POA: Diagnosis not present

## 2023-11-24 NOTE — Progress Notes (Signed)
 Time: 12:01 pm-12:58 pm CPT Code: [id number] Diagnosis: F43.22  Tracey Wiley was seen remotely using secure video conferencing. She was in her home and therapist was in her office at time of appointment. Client is aware of risks of telehealth and consented to a virtual visit.  Session focused on her tendency to obsess over perceived wrongs. She processed a recent incident, and therapist suggested strategies to sit with the distress and build tolerance for it. Session also included discussion of challenges in her relationship, with therapist suggesting communication strategies. She is scheduled to be seen again in one week.  Treatment Plan Client Abilities/Strengths Tracey Wiley shared that she has participated in therapy in the past and has been very honest in session.  Client Treatment Preferences:  Tracey Wiley prefers in person appointments. She prefers Tuesday and Thursday afternoon appointments. Client Statement of Needs  Tracey Wiley is seeking validation of her emotional responses and overall self-concept. Treatment Level  Weekly   Symptoms Tearfulness, irritability Problems Addressed  Goals Tracey Wiley will reduce symptoms of depression and improve overall mood 1. Tracey Wiley will increase comfort in social situations Objective  Target Date: 01/08/2024 Frequency: Weekly  Progress: 0 Modality: Individual therapy  Objective  Target Date: 01/07/2024 Frequency: Weekly  Progress: 0 Modality: individual therapy  Related Interventions  Tracey Wiley will have opportunities to process her experiences in session  Therapist will point out maladaptive thought and behavior patterns using CBT strategies. Therapist will incorporate behavior activation as appropriate. Therapist will incorporate gradual exposure therapy as appropriate. Therapist will provide referrals for additional resource as appropriate.  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

## 2023-11-25 ENCOUNTER — Other Ambulatory Visit: Payer: Self-pay | Admitting: Physician Assistant

## 2023-11-28 ENCOUNTER — Ambulatory Visit

## 2023-11-28 DIAGNOSIS — M6281 Muscle weakness (generalized): Secondary | ICD-10-CM

## 2023-11-28 DIAGNOSIS — R2689 Other abnormalities of gait and mobility: Secondary | ICD-10-CM

## 2023-11-28 DIAGNOSIS — M150 Primary generalized (osteo)arthritis: Secondary | ICD-10-CM

## 2023-11-28 DIAGNOSIS — M79651 Pain in right thigh: Secondary | ICD-10-CM
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
## 2023-11-29 ENCOUNTER — Encounter: Payer: Self-pay | Admitting: Internal Medicine

## 2023-11-29 ENCOUNTER — Ambulatory Visit: Admitting: Internal Medicine

## 2023-11-29 VITALS — BP 138/80 | HR 67 | Ht 64.0 in | Wt 217.2 lb

## 2023-11-29 DIAGNOSIS — M6588 Other synovitis and tenosynovitis, other site: Secondary | ICD-10-CM | POA: Insufficient documentation

## 2023-11-29 DIAGNOSIS — Z800 Family history of malignant neoplasm of digestive organs: Secondary | ICD-10-CM

## 2023-11-29 DIAGNOSIS — Z803 Family history of malignant neoplasm of breast: Secondary | ICD-10-CM

## 2023-11-29 DIAGNOSIS — M19041 Primary osteoarthritis, right hand: Secondary | ICD-10-CM | POA: Diagnosis not present

## 2023-11-29 DIAGNOSIS — E669 Obesity, unspecified: Secondary | ICD-10-CM

## 2023-11-29 DIAGNOSIS — M19042 Primary osteoarthritis, left hand: Secondary | ICD-10-CM

## 2023-11-29 DIAGNOSIS — R609 Edema, unspecified: Secondary | ICD-10-CM

## 2023-11-29 NOTE — Patient Instructions (Addendum)
 I recommend a Referral to [facility] Risk Breast Clinic with Jina Nephew, MD in [place] when you are ready  to consider annual  MRI breast  YOUR BLOOD PRESSURE IS VERY HIGH TODAY  It should be no higher than  130/80 , ideally  less.  Please check your blood pressure a few times at home and send me the readings so I can determine if you need to increase your MEDICATION.  Ultrasound of hand will be ordered for next occurrence of hand pain and  swelling

## 2023-11-29 NOTE — Assessment & Plan Note (Addendum)
 Referring to local PT for management of pain and stiffness .  HAS SEEN [facility] IN EARLY JUNE ;

## 2023-11-29 NOTE — Assessment & Plan Note (Signed)
 Recommending referral to high risk breast clinic

## 2023-11-29 NOTE — Progress Notes (Signed)
 Subjective:  Patient ID: Tracey Wiley, female    DOB: 1955/09/18  Age: 68 y.o. MRN: [id number]  CC: The primary encounter diagnosis was Osteoarthritis of both hands, unspecified osteoarthritis type. 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   HPI Tracey Wiley presents for  Chief Complaint  Patient presents with   Medical Management of Chronic Issues    3 month follow up    1) had follow up with rheum both at [facility] and [place] . [place] :  OA hands and PMR.basd on repeat films of hands and labs.  Recommended an alternative to prednisone   for flareups   last one in April responded to cephalexin  (3rd time it responded)  both rheumatologist have agree  decided was some form of gout so she recommended colchicine  used immediately upon onset of pain  also dx RS3PE and  recommended an  urgent ultrasound next  flare if hands  . She also recommended surveillance of breast,  ovarian and colon CA .  Sees [facility] at [facility] , appt is in November.     2)  FH of breast and colon CA:  Patient requesting a CA 125  .  Sees [facility] in July . WANTS  to defer annual mri of breast 4  3) Hip pain: Benefitting from PT .  Seeing Lonni Go,  PT>  wants to continue  on Mon Wed and do it on her own on Fridays .  Thigh pain  noncturnal unchanged     Outpatient Medications Prior to Visit  Medication Sig Dispense Refill   acetaminophen  (TYLENOL ) 500 MG tablet Take 500 mg by mouth daily as needed for pain.     aspirin EC 81 MG tablet Take 81 mg by mouth daily.     cholecalciferol (VITAMIN D3) 25 MCG (1000 UNIT) tablet Take 1,000 Units by mouth daily.     colchicine  0.6 MG tablet TAKE 1 TABLET (0.6 MG TOTAL) BY MOUTH 2 (TWO) TIMES DAILY AS NEEDED. 180 tablet 0   [facility] HYDROCORTISONE  ANTI-ITCH 0.5 % APPLY TO THE AFFECTED AREA TWICE A DAY 57  g 11   esomeprazole  (NEXIUM ) 40 MG capsule TAKE 1 CAPSULE (40 MG TOTAL) BY MOUTH DAILY. 90 capsule 1   ezetimibe  (ZETIA ) 10 MG tablet TAKE 1 TABLET BY MOUTH EVERY DAY 90 tablet 0   famotidine  (PEPCID ) 20 MG tablet TAKE 1 TABLET BY MOUTH EVERYDAY AT BEDTIME 90 tablet 0   Flaxseed, Linseed, (FLAX SEED OIL) 1000 MG CAPS Take 1 capsule by mouth 2 (two) times daily.     furosemide  (LASIX ) 20 MG tablet TAKE 1 TABLET BY MOUTH EVERY DAY AS NEEDED 90 tablet 3   magnesium  gluconate (MAGONATE) 500 MG tablet Take 500 mg by mouth at bedtime.     MELATONIN ER PO Take 6 mg by mouth at bedtime.     Omega-3 Fatty Acids (OMEGA-3 FISH OIL PO) Take 1 capsule by mouth daily.     ondansetron  (ZOFRAN  ODT) 4 MG disintegrating tablet Take 1 tablet (4 mg total) by mouth every 8 (eight) hours as needed for nausea or vomiting. 20 tablet 0   Pitavastatin  Calcium  2 MG TABS TAKE 1 TABLET BY MOUTH EVERY DAY 90 tablet 3   potassium chloride  (KLOR-CON ) 10 MEQ tablet TAKE 1 TABLET (10 MEQ TOTAL) BY MOUTH DAILY AS NEEDED. (Patient taking differently: Take 10  mEq by mouth daily.) 90 tablet 3   promethazine  (PHENERGAN ) 12.5 MG tablet Take 12.5 mg by mouth every 6 (six) hours as needed for nausea or vomiting.     sotalol  (BETAPACE ) 80 MG tablet TAKE 1 TABLET BY MOUTH TWICE A DAY 180 tablet 3   sucralfate  (CARAFATE ) 1 g tablet Take 1 g by mouth as needed.     vitamin C (ASCORBIC ACID) 500 MG tablet Take 1,000 mg by mouth daily.     zinc  gluconate 50 MG tablet Take 50 mg by mouth daily.     zinc  oxide (MEIJER ZINC  OXIDE) 20 % ointment Apply 1 Application topically 2 (two) times daily as needed for irritation (to lip corners). 56.7 g 0   valACYclovir  (VALTREX ) 1000 MG tablet TAKE 1 TABLET BY MOUTH DAILY FOR SUPPRESSION , OR 5 TIMES DAILY FOR FLARE 35 tablet 2   amoxicillin  (AMOXIL ) 500 MG capsule TAKE 4 TABS 1 HOUR PRIOR TO DENTAL PROCEDURES (Patient not taking: Reported on 11/29/2023) 4 capsule 1   cetirizine  (ZYRTEC ) 10 MG tablet TAKE 1  TABLET BY MOUTH EVERY DAY (Patient not taking: Reported on 11/29/2023) 90 tablet 4   famciclovir  (FAMVIR ) 500 MG tablet Take 1 tablet (500 mg total) by mouth 3 (three) times daily. (Patient not taking: Reported on 11/29/2023) 21 tablet 5   ipratropium (ATROVENT ) 0.06 % nasal spray USE 1 SPRAY IN EACH NOSTRIL 1-2 TIMES DAILY AS NEEDED TO CONTROL DRAINAGE (Patient not taking: Reported on 11/29/2023) 45 mL 0   levalbuterol  (XOPENEX  HFA) 45 MCG/ACT inhaler Inhale 2 puffs into the lungs every 4 (four) hours as needed for wheezing. (Patient not taking: Reported on 11/29/2023) 15 g 1   mupirocin  ointment (BACTROBAN ) 2 % Apply 1 Application topically 2 (two) times daily. To nares (Patient not taking: Reported on 11/29/2023) 22 g 0   No facility-administered medications prior to visit.    Review of Systems;  Patient denies headache, fevers, malaise, unintentional weight loss, skin rash, eye pain, sinus congestion and sinus pain, sore throat, dysphagia,  hemoptysis , cough, dyspnea, wheezing, chest pain, palpitations, orthopnea, edema, abdominal pain, nausea, melena, diarrhea, constipation, flank pain, dysuria, hematuria, urinary  Frequency, nocturia, numbness, tingling, seizures,  Focal weakness, Loss of consciousness,  Tremor, insomnia, depression, anxiety, and suicidal ideation.      Objective:  BP 138/80   Pulse 67   Ht 5' 4 (1.626 m)   Wt 217 lb 3.2 oz (98.5 kg)   SpO2 98%   BMI 37.28 kg/m   BP Readings from Last 3 Encounters:  11/29/23 138/80  10/26/23 133/76  09/27/23 130/70    Wt Readings from Last 3 Encounters:  11/29/23 217 lb 3.2 oz (98.5 kg)  10/26/23 217 lb (98.4 kg)  09/27/23 218 lb 2 oz (98.9 kg)    Physical Exam Vitals reviewed.  Constitutional:      General: She is not in acute distress.    Appearance: Normal appearance. She is obese. She is not ill-appearing, toxic-appearing or diaphoretic.  HENT:     Head: Normocephalic.   Eyes:     General: No scleral icterus.        Right eye: No discharge.        Left eye: No discharge.     Conjunctiva/sclera: Conjunctivae normal.    Cardiovascular:     Rate and Rhythm: Normal rate and regular rhythm.     Heart sounds: Normal heart sounds.  Pulmonary:     Effort: Pulmonary effort is normal. No  respiratory distress.     Breath sounds: Normal breath sounds.   Musculoskeletal:        General: Normal range of motion.   Skin:    General: Skin is warm and dry.   Neurological:     General: No focal deficit present.     Mental Status: She is alert and oriented to person, place, and time. Mental status is at baseline.   Psychiatric:        Mood and Affect: Mood normal.        Behavior: Behavior normal.        Thought Content: Thought content normal.        Judgment: Judgment normal.    Lab Results  Component Value Date   HGBA1C 5.9 (H) 08/19/2023   HGBA1C 6.1 (H) 06/10/2023   HGBA1C 6.2 (H) 10/13/2022    Lab Results  Component Value Date   CREATININE 0.76 10/07/2023   CREATININE 0.82 10/13/2022   CREATININE 0.96 03/12/2022    Lab Results  Component Value Date   WBC 9.6 08/19/2023   HGB 12.5 08/19/2023   HCT 38.0 08/19/2023   PLT 325 08/19/2023   GLUCOSE 107 (H) 10/07/2023   CHOL 217 (H) 09/27/2023   TRIG 233 (H) 09/27/2023   HDL 38 (L) 09/27/2023   LDLDIRECT 159 (H) 10/13/2022   LDLCALC 137 (H) 09/27/2023   ALT 11 10/07/2023   AST 19 10/07/2023   NA 139 10/07/2023   K 4.3 10/07/2023   CL 102 10/07/2023   CREATININE 0.76 10/07/2023   BUN 16 10/07/2023   CO2 30 10/07/2023   TSH 2.52 05/17/2023   HGBA1C 5.9 (H) 08/19/2023   MICROALBUR <0.7 10/25/2022    No results found.  Assessment & Plan:  .Osteoarthritis of both hands, unspecified osteoarthritis type Assessment & Plan: Referring to local PT for management of pain and stiffness .  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  Advised to obtain u/s of hand for next occurrence    Obesity (BMI 30-39.9) Assessment & Plan: I have addressed  BMI and recommended wt loss of 10% of body weight over the next 6 months using a low fat, fruit/vegetable based Mediteranean diet and regular exercise a minimum of 5 days per week.     Family history of breast cancer Assessment & Plan: Recommending referral to high risk breast clinic    Family history of Lynch syndrome Assessment & Plan: 2020  colonoscopy was normal. Follow up with Dr.  Avram       I spent 44 minutes on the day of this face to face encounter reviewing patient's  most recent visit with rheumatology,   prior relevant surgical and non surgical procedures, recent  labs and imaging studies, counseling on weight management,  reviewing the assessment and plan with patient, and post visit ordering and reviewing of  diagnostics and therapeutics with patient  .   Follow-up: Return in about 3 months (around 02/29/2024).   Verneita LITTIE Kettering, MD

## 2023-11-29 NOTE — Assessment & Plan Note (Signed)
 2020  colonoscopy was normal. Follow up with Dr.  Avram

## 2023-11-29 NOTE — Assessment & Plan Note (Addendum)
 Tentative diagnosis by [facility] given her episodic symptoms, elevated CRP, and resolution with cephalexin .  Advised to obtain u/s of hand for next occurrence

## 2023-11-29 NOTE — Assessment & Plan Note (Signed)
I have addressed  BMI and recommended wt loss of 10% of body weight over the next 6 months using a low fat, fruit/vegetable based Mediteranean diet and regular exercise a minimum of 5 days per week.   

## 2023-11-30 ENCOUNTER — Ambulatory Visit

## 2023-11-30 DIAGNOSIS — R2689 Other abnormalities of gait and mobility: Secondary | ICD-10-CM

## 2023-11-30 DIAGNOSIS — M6281 Muscle weakness (generalized): Secondary | ICD-10-CM | POA: Diagnosis not present

## 2023-11-30 DIAGNOSIS — M150 Primary generalized (osteo)arthritis: Secondary | ICD-10-CM

## 2023-11-30 DIAGNOSIS — M79651 Pain in right thigh: Secondary | ICD-10-CM
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
## 2023-12-01 ENCOUNTER — Ambulatory Visit (INDEPENDENT_AMBULATORY_CARE_PROVIDER_SITE_OTHER): Admitting: Clinical

## 2023-12-01 DIAGNOSIS — F4322 Adjustment disorder with anxiety: Secondary | ICD-10-CM | POA: Diagnosis not present

## 2023-12-01 NOTE — Progress Notes (Signed)
 Time: 12:01 pm-12:58 pm CPT Code: [id number]-04 Diagnosis: F43.22  Larkyn was seen remotely using secure video conferencing. She was in her home and therapist was in her home at time of appointment. Client is aware of risks of telehealth and consented to a virtual visit.  She reported having been visited by a stepson she has not seen in nearly 10 years. Session began by processing this. Dorna also shared concerns about recent health developments, and therapist offered alternate perspectives and worked with her to consider next steps. She is scheduled to be seen again in one week.  Treatment Plan Client Abilities/Strengths Katheren shared that she has participated in therapy in the past and has been very honest in session.  Client Treatment Preferences:  Nocole prefers in person appointments. She prefers Tuesday and Thursday afternoon appointments. Client Statement of Needs  Macaila is seeking validation of her emotional responses and overall self-concept. Treatment Level  Weekly   Symptoms Tearfulness, irritability Problems Addressed  Goals Louiza will reduce symptoms of depression and improve overall mood 1. Garnett will increase comfort in social situations Objective  Target Date: 01/08/2024 Frequency: Weekly  Progress: 0 Modality: Individual therapy  Objective  Target Date: 01/07/2024 Frequency: Weekly  Progress: 0 Modality: individual therapy  Related Interventions  Atavia will have opportunities to process her experiences in session  Therapist will point out maladaptive thought and behavior patterns using CBT strategies. Therapist will incorporate behavior activation as appropriate. Therapist will incorporate gradual exposure therapy as appropriate. Therapist will provide referrals for additional resource as appropriate.  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

## 2023-12-05 ENCOUNTER — Ambulatory Visit

## 2023-12-05 DIAGNOSIS — R2689 Other abnormalities of gait and mobility: Secondary | ICD-10-CM

## 2023-12-05 DIAGNOSIS — M6281 Muscle weakness (generalized): Secondary | ICD-10-CM | POA: Diagnosis not present

## 2023-12-05 DIAGNOSIS — M79651 Pain in right thigh: Secondary | ICD-10-CM

## 2023-12-05 DIAGNOSIS — M150 Primary generalized (osteo)arthritis: Secondary | ICD-10-CM
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
## 2023-12-06 ENCOUNTER — Ambulatory Visit (INDEPENDENT_AMBULATORY_CARE_PROVIDER_SITE_OTHER): Admitting: Clinical

## 2023-12-06 DIAGNOSIS — F4322 Adjustment disorder with anxiety: Secondary | ICD-10-CM | POA: Diagnosis not present

## 2023-12-06 NOTE — Progress Notes (Signed)
 Time: 3:00 pm-3:58 pm CPT Code: [id number]-04 Diagnosis: F43.22  Tracey Wiley was seen remotely using secure video conferencing. She was in her home and therapist was in her home at time of appointment. Client is aware of risks of telehealth and consented to a virtual visit.  Session focused on continuing to process Tracey Wiley's concerns about her health and consider next steps. Therapist suggested communication strategies and offered validation and support. She is scheduled to be seen again in one week.  Treatment Plan Client Abilities/Strengths Tracey Wiley shared that she has participated in therapy in the past and has been very honest in session.  Client Treatment Preferences:  Tracey Wiley prefers in person appointments. She prefers Tuesday and Thursday afternoon appointments. Client Statement of Needs  Tracey Wiley is seeking validation of her emotional responses and overall self-concept. Treatment Level  Weekly   Symptoms Tearfulness, irritability Problems Addressed  Goals Tracey Wiley will reduce symptoms of depression and improve overall mood 1. Tracey Wiley will increase comfort in social situations Objective  Target Date: 01/08/2024 Frequency: Weekly  Progress: 0 Modality: Individual therapy  Objective  Target Date: 01/07/2024 Frequency: Weekly  Progress: 0 Modality: individual therapy  Related Interventions  Skylah will have opportunities to process her experiences in session  Therapist will point out maladaptive thought and behavior patterns using CBT strategies. Therapist will incorporate behavior activation as appropriate. Therapist will incorporate gradual exposure therapy as appropriate. Therapist will provide referrals for additional resource as appropriate.  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

## 2023-12-07 ENCOUNTER — Ambulatory Visit: Attending: Internal Medicine

## 2023-12-07 DIAGNOSIS — R2689 Other abnormalities of gait and mobility: Secondary | ICD-10-CM | POA: Diagnosis present

## 2023-12-07 DIAGNOSIS — M79652 Pain in left thigh: Secondary | ICD-10-CM | POA: Insufficient documentation

## 2023-12-07 DIAGNOSIS — M150 Primary generalized (osteo)arthritis: Secondary | ICD-10-CM | POA: Insufficient documentation

## 2023-12-07 DIAGNOSIS — M6281 Muscle weakness (generalized): Secondary | ICD-10-CM | POA: Insufficient documentation

## 2023-12-07 DIAGNOSIS — M79651 Pain in right thigh: Secondary | ICD-10-CM | POA: Diagnosis present
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
## 2023-12-08 ENCOUNTER — Ambulatory Visit: Admitting: Clinical

## 2023-12-12 ENCOUNTER — Ambulatory Visit

## 2023-12-12 DIAGNOSIS — M150 Primary generalized (osteo)arthritis: Secondary | ICD-10-CM | POA: Diagnosis not present

## 2023-12-12 DIAGNOSIS — M6281 Muscle weakness (generalized): Secondary | ICD-10-CM

## 2023-12-12 DIAGNOSIS — R2689 Other abnormalities of gait and mobility: Secondary | ICD-10-CM

## 2023-12-12 DIAGNOSIS — M79651 Pain in right thigh: Secondary | ICD-10-CM
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
## 2023-12-14 ENCOUNTER — Ambulatory Visit

## 2023-12-14 DIAGNOSIS — M6281 Muscle weakness (generalized): Secondary | ICD-10-CM

## 2023-12-14 DIAGNOSIS — R2689 Other abnormalities of gait and mobility: Secondary | ICD-10-CM

## 2023-12-14 DIAGNOSIS — M150 Primary generalized (osteo)arthritis: Secondary | ICD-10-CM

## 2023-12-14 DIAGNOSIS — M79651 Pain in right thigh: Secondary | ICD-10-CM
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
## 2023-12-15 ENCOUNTER — Ambulatory Visit (INDEPENDENT_AMBULATORY_CARE_PROVIDER_SITE_OTHER): Admitting: Clinical

## 2023-12-15 DIAGNOSIS — F4322 Adjustment disorder with anxiety: Secondary | ICD-10-CM

## 2023-12-15 NOTE — Progress Notes (Signed)
 Time: 1:00 pm-1:58 pm CPT Code: [id number]-04 Diagnosis: F43.22  Tracey Wiley was seen remotely using secure video conferencing. She was in her home and therapist was in her home at time of appointment. Client is aware of risks of telehealth and consented to a virtual visit.  Tracey Wiley reported upon stressors across several areas of her life. Therapist offered validation and support, encouraging Tracey Wiley to consider how she might be able to step out of her comfort zone while maintaining healthy boundaries. She is scheduled to be seen again in one week.  Treatment Plan Client Abilities/Strengths Tracey Wiley shared that she has participated in therapy in the past and has been very honest in session.  Client Treatment Preferences:  Tracey Wiley prefers in person appointments. She prefers Tuesday and Thursday afternoon appointments. Client Statement of Needs  Tracey Wiley is seeking validation of her emotional responses and overall self-concept. Treatment Level  Weekly   Symptoms Tearfulness, irritability Problems Addressed  Goals Tracey Wiley will reduce symptoms of depression and improve overall mood 1. Tracey Wiley will increase comfort in social situations Objective  Target Date: 01/08/2024 Frequency: Weekly  Progress: 0 Modality: Individual therapy  Objective  Target Date: 01/07/2024 Frequency: Weekly  Progress: 0 Modality: individual therapy  Related Interventions  Tracey Wiley will have opportunities to process her experiences in session  Therapist will point out maladaptive thought and behavior patterns using CBT strategies. Therapist will incorporate behavior activation as appropriate. Therapist will incorporate gradual exposure therapy as appropriate. Therapist will provide referrals for additional resource as appropriate.  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

## 2023-12-19 ENCOUNTER — Ambulatory Visit

## 2023-12-21 ENCOUNTER — Ambulatory Visit

## 2023-12-21 DIAGNOSIS — M79651 Pain in right thigh: Secondary | ICD-10-CM

## 2023-12-21 DIAGNOSIS — M150 Primary generalized (osteo)arthritis: Secondary | ICD-10-CM | POA: Diagnosis not present

## 2023-12-21 DIAGNOSIS — R2689 Other abnormalities of gait and mobility: Secondary | ICD-10-CM

## 2023-12-21 DIAGNOSIS — M6281 Muscle weakness (generalized): Secondary | ICD-10-CM
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
## 2023-12-22 ENCOUNTER — Ambulatory Visit: Admitting: Clinical

## 2023-12-22 ENCOUNTER — Encounter: Payer: Self-pay | Admitting: Gastroenterology

## 2023-12-22 ENCOUNTER — Ambulatory Visit: Admitting: Gastroenterology

## 2023-12-22 VITALS — BP 140/70 | Ht 65.0 in | Wt 216.0 lb

## 2023-12-22 DIAGNOSIS — K219 Gastro-esophageal reflux disease without esophagitis: Secondary | ICD-10-CM

## 2023-12-22 DIAGNOSIS — R1319 Other dysphagia: Secondary | ICD-10-CM | POA: Diagnosis not present

## 2023-12-22 DIAGNOSIS — Z800 Family history of malignant neoplasm of digestive organs: Secondary | ICD-10-CM | POA: Diagnosis not present

## 2023-12-22 DIAGNOSIS — F4322 Adjustment disorder with anxiety: Secondary | ICD-10-CM

## 2023-12-22 MED ORDER — SUCRALFATE 1 G PO TABS: 1.0000 g | ORAL_TABLET | Freq: Two times a day (BID) | ORAL | 2 refills | Status: AC

## 2023-12-22 MED ORDER — ESOMEPRAZOLE MAGNESIUM 40 MG PO CPDR: 40.0000 mg | DELAYED_RELEASE_CAPSULE | Freq: Every day | ORAL | 2 refills | Status: DC

## 2023-12-22 MED ORDER — FAMOTIDINE 20 MG PO TABS: 20.0000 mg | ORAL_TABLET | Freq: Every day | ORAL | 2 refills | Status: DC

## 2023-12-22 NOTE — Progress Notes (Signed)
 Time: 1:00 pm-1:58 pm CPT Code: [id number] Diagnosis: F43.22  Tracey Wiley was seen in person for therapy. She reflected upon an especially stressful two weeks, during which she had experienced difficulties with her phone service, her ex-husband's [facility], and stress related to her health and friendships. Therapist offered validation and support, reframing several negative cognitions and suggesting communication strategies. She is scheduled to be seen again in one week.  Treatment Plan Client Abilities/Strengths Tracey Wiley shared that she has participated in therapy in the past and has been very honest in session.  Client Treatment Preferences:  Tracey Wiley prefers in person appointments. She prefers Tuesday and Thursday afternoon appointments. Client Statement of Needs  Tracey Wiley is seeking validation of her emotional responses and overall self-concept. Treatment Level  Weekly   Symptoms Tearfulness, irritability Problems Addressed  Goals Tracey Wiley will reduce symptoms of depression and improve overall mood 1. Tracey Wiley will increase comfort in social situations Objective  Target Date: 01/08/2024 Frequency: Weekly  Progress: 0 Modality: Individual therapy  Objective  Target Date: 01/07/2024 Frequency: Weekly  Progress: 0 Modality: individual therapy  Related Interventions  Tracey Wiley will have opportunities to process her experiences in session  Therapist will point out maladaptive thought and behavior patterns using CBT strategies. Therapist will incorporate behavior activation as appropriate. Therapist will incorporate gradual exposure therapy as appropriate. Therapist will provide referrals for additional resource as appropriate.  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

## 2023-12-22 NOTE — Patient Instructions (Signed)
 We have sent the following medications to your pharmacy for you to pick up at your convenience: Carafate , Nexium , famotidine    You will be due for a recall colonoscopy/egd  in Nov 2025. We will send you a reminder in the mail when it gets closer to that time.  _______________________________________________________  If your blood pressure at your visit was 140/90 or greater, please contact your primary care physician to follow up on this.  _______________________________________________________  If you are age 49 or older, your body mass index should be between 23-30. Your Body mass index is 35.94 kg/m. If this is out of the aforementioned range listed, please consider follow up with your Primary Care Provider.  If you are age 22 or younger, your body mass index should be between 19-25. Your Body mass index is 35.94 kg/m. If this is out of the aformentioned range listed, please consider follow up with your Primary Care Provider.   ________________________________________________________  The [facility] providers would like to encourage you to use MYCHART to communicate with providers for non-urgent requests or questions.  Due to long hold times on the telephone, sending your provider a message by [facility] may be a faster and more efficient way to get a response.  Please allow 48 business hours for a response.  Please remember that this is for non-urgent requests.  _______________________________________________________  Thank you for choosing me and  Gastroenterology.  Camie Furbish, PA-C
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
## 2023-12-26 ENCOUNTER — Ambulatory Visit

## 2023-12-26 DIAGNOSIS — M6281 Muscle weakness (generalized): Secondary | ICD-10-CM

## 2023-12-26 DIAGNOSIS — R2689 Other abnormalities of gait and mobility: Secondary | ICD-10-CM

## 2023-12-26 DIAGNOSIS — M150 Primary generalized (osteo)arthritis: Secondary | ICD-10-CM

## 2023-12-26 DIAGNOSIS — M79651 Pain in right thigh: Secondary | ICD-10-CM
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
## 2023-12-27 ENCOUNTER — Telehealth: Payer: Self-pay | Admitting: Gastroenterology

## 2023-12-27 DIAGNOSIS — R1310 Dysphagia, unspecified: Secondary | ICD-10-CM

## 2023-12-27 NOTE — Telephone Encounter (Signed)
 Called patient today to discuss scheduling EGD w/ possible dilation to be done sooner than November (with repeat colonoscopy), due to possibility that her symptoms could indicate malignancy. She would like to proceed with scheduling EGD.  Please call patient to schedule EGD w/ dilation for esophageal dysphagia with Dr. Avram in the [facility].

## 2023-12-27 NOTE — Telephone Encounter (Signed)
 Left message for patient

## 2023-12-27 NOTE — Telephone Encounter (Signed)
 Scheduling both colon and EGD with dil now, or just EGD with dil? Waiting for Colonoscopy in November?

## 2023-12-28 ENCOUNTER — Ambulatory Visit

## 2023-12-28 DIAGNOSIS — M150 Primary generalized (osteo)arthritis: Secondary | ICD-10-CM | POA: Diagnosis not present

## 2023-12-28 DIAGNOSIS — M79651 Pain in right thigh: Secondary | ICD-10-CM

## 2023-12-28 DIAGNOSIS — M6281 Muscle weakness (generalized): Secondary | ICD-10-CM

## 2023-12-28 DIAGNOSIS — R2689 Other abnormalities of gait and mobility: Secondary | ICD-10-CM
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
## 2023-12-28 NOTE — Telephone Encounter (Signed)
 Patient has been scheduled for 01/20/24 [facility] EGD with Dr Avram. Instructions sent to patient via my chart and mailed to patient.

## 2023-12-29 ENCOUNTER — Ambulatory Visit: Admitting: Clinical

## 2023-12-29 DIAGNOSIS — F4322 Adjustment disorder with anxiety: Secondary | ICD-10-CM

## 2023-12-29 NOTE — Progress Notes (Signed)
 Time: 1:00 pm-1:58 pm CPT Code: 09162E-04 Diagnosis: F43.22  Tracey Wiley was seen remotely using secure video conferencing. She and the therapist were in their respective homes at time of appointment. Client is aware of risks of telehealth and consented to a virtual visit. Session focused on a conflict that had arisen in her relationship. Therapist suggested alternate perspectives and communication strategies. She is scheduled to be seen again in one week.  Treatment Plan Client Abilities/Strengths Victorious shared that she has participated in therapy in the past and has been very honest in session.  Client Treatment Preferences:  Tracey Wiley prefers in person appointments. She prefers Tuesday and Thursday afternoon appointments. Client Statement of Needs  Tracey Wiley is seeking validation of her emotional responses and overall self-concept. Treatment Level  Weekly   Symptoms Tearfulness, irritability Problems Addressed  Goals Tracey Wiley will reduce symptoms of depression and improve overall mood 1. Tracey Wiley will increase comfort in social situations Objective  Target Date: 01/08/2024 Frequency: Weekly  Progress: 0 Modality: Individual therapy  Objective  Target Date: 01/07/2024 Frequency: Weekly  Progress: 0 Modality: individual therapy  Related Interventions  Tracey Wiley will have opportunities to process her experiences in session  Therapist will point out maladaptive thought and behavior patterns using CBT strategies. Therapist will incorporate behavior activation as appropriate. Therapist will incorporate gradual exposure therapy as appropriate. Therapist will provide referrals for additional resource as appropriate.  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

## 2024-01-02 ENCOUNTER — Ambulatory Visit

## 2024-01-04 ENCOUNTER — Ambulatory Visit

## 2024-01-05 ENCOUNTER — Ambulatory Visit (INDEPENDENT_AMBULATORY_CARE_PROVIDER_SITE_OTHER): Admitting: Clinical

## 2024-01-05 DIAGNOSIS — F4322 Adjustment disorder with anxiety: Secondary | ICD-10-CM

## 2024-01-05 NOTE — Progress Notes (Signed)
 Time: 12:00 pm-12:58 pm CPT Code: 09162E-04 Diagnosis: F43.22  Cathy was seen remotely using secure video conferencing. She and the therapist were in their respective homes at time of appointment. Client is aware of risks of telehealth and consented to a virtual visit. She reflected upon developments in her relationship over the past week. Therapist engaged her in discussion of different communication styles, and suggested strategies to support communication. She is scheduled to be seen again in one week.  Treatment Plan Client Abilities/Strengths Robinette shared that she has participated in therapy in the past and has been very honest in session.  Client Treatment Preferences:  [place] prefers in person appointments. She prefers Tuesday and Thursday afternoon appointments. Client Statement of Needs  Deidrea is seeking validation of her emotional responses and overall self-concept. Treatment Level  Weekly   Symptoms Tearfulness, irritability Problems Addressed  Goals Hyacinth will reduce symptoms of depression and improve overall mood 1. Loy will increase comfort in social situations Objective  Target Date: 01/08/2024 Frequency: Weekly  Progress: 0 Modality: Individual therapy  Objective  Target Date: 01/07/2024 Frequency: Weekly  Progress: 0 Modality: individual therapy  Related Interventions  Fartun will have opportunities to process her experiences in session  Therapist will point out maladaptive thought and behavior patterns using CBT strategies. Therapist will incorporate behavior activation as appropriate. Therapist will incorporate gradual exposure therapy as appropriate. Therapist will provide referrals for additional resource as appropriate.  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

## 2024-01-07 ENCOUNTER — Inpatient Hospital Stay (HOSPITAL_COMMUNITY)

## 2024-01-07 ENCOUNTER — Inpatient Hospital Stay (HOSPITAL_COMMUNITY)
Admission: EM | Admit: 2024-01-07 | Discharge: 2024-01-10 | DRG: 322 | Disposition: A | Discharge status: Home / Self Care | Attending: Internal Medicine | Admitting: Internal Medicine

## 2024-01-07 ENCOUNTER — Encounter (HOSPITAL_COMMUNITY): Payer: Self-pay

## 2024-01-07 ENCOUNTER — Other Ambulatory Visit: Payer: Self-pay

## 2024-01-07 ENCOUNTER — Emergency Department (HOSPITAL_COMMUNITY)

## 2024-01-07 DIAGNOSIS — E1165 Type 2 diabetes mellitus with hyperglycemia: Secondary | ICD-10-CM | POA: Diagnosis present

## 2024-01-07 DIAGNOSIS — R001 Bradycardia, unspecified: Secondary | ICD-10-CM | POA: Diagnosis present

## 2024-01-07 DIAGNOSIS — Z8701 Personal history of pneumonia (recurrent): Secondary | ICD-10-CM

## 2024-01-07 DIAGNOSIS — Z6835 Body mass index (BMI) 35.0-35.9, adult: Secondary | ICD-10-CM

## 2024-01-07 DIAGNOSIS — I480 Paroxysmal atrial fibrillation: Secondary | ICD-10-CM | POA: Diagnosis present

## 2024-01-07 DIAGNOSIS — Z888 Allergy status to other drugs, medicaments and biological substances status: Secondary | ICD-10-CM | POA: Diagnosis not present

## 2024-01-07 DIAGNOSIS — Z7952 Long term (current) use of systemic steroids: Secondary | ICD-10-CM

## 2024-01-07 DIAGNOSIS — Z79899 Other long term (current) drug therapy: Secondary | ICD-10-CM | POA: Diagnosis not present

## 2024-01-07 DIAGNOSIS — E66812 Obesity, class 2: Secondary | ICD-10-CM | POA: Diagnosis present

## 2024-01-07 DIAGNOSIS — E669 Obesity, unspecified: Secondary | ICD-10-CM | POA: Diagnosis present

## 2024-01-07 DIAGNOSIS — I214 Non-ST elevation (NSTEMI) myocardial infarction: Principal | ICD-10-CM | POA: Diagnosis present

## 2024-01-07 DIAGNOSIS — Z8774 Personal history of (corrected) congenital malformations of heart and circulatory system: Secondary | ICD-10-CM

## 2024-01-07 DIAGNOSIS — Z883 Allergy status to other anti-infective agents status: Secondary | ICD-10-CM

## 2024-01-07 DIAGNOSIS — Z7901 Long term (current) use of anticoagulants: Secondary | ICD-10-CM | POA: Diagnosis not present

## 2024-01-07 DIAGNOSIS — I4719 Other supraventricular tachycardia: Secondary | ICD-10-CM | POA: Diagnosis present

## 2024-01-07 DIAGNOSIS — E782 Mixed hyperlipidemia: Secondary | ICD-10-CM | POA: Diagnosis present

## 2024-01-07 DIAGNOSIS — Z800 Family history of malignant neoplasm of digestive organs: Secondary | ICD-10-CM

## 2024-01-07 DIAGNOSIS — I10 Essential (primary) hypertension: Secondary | ICD-10-CM | POA: Diagnosis present

## 2024-01-07 DIAGNOSIS — Z86718 Personal history of other venous thrombosis and embolism: Secondary | ICD-10-CM

## 2024-01-07 DIAGNOSIS — R55 Syncope and collapse: Secondary | ICD-10-CM | POA: Diagnosis present

## 2024-01-07 DIAGNOSIS — E785 Hyperlipidemia, unspecified: Secondary | ICD-10-CM

## 2024-01-07 DIAGNOSIS — J45909 Unspecified asthma, uncomplicated: Secondary | ICD-10-CM | POA: Diagnosis present

## 2024-01-07 DIAGNOSIS — Z8249 Family history of ischemic heart disease and other diseases of the circulatory system: Secondary | ICD-10-CM

## 2024-01-07 DIAGNOSIS — Z83438 Family history of other disorder of lipoprotein metabolism and other lipidemia: Secondary | ICD-10-CM

## 2024-01-07 DIAGNOSIS — Z7982 Long term (current) use of aspirin: Secondary | ICD-10-CM

## 2024-01-07 DIAGNOSIS — M069 Rheumatoid arthritis, unspecified: Secondary | ICD-10-CM | POA: Diagnosis present

## 2024-01-07 DIAGNOSIS — I071 Rheumatic tricuspid insufficiency: Secondary | ICD-10-CM | POA: Diagnosis present

## 2024-01-07 DIAGNOSIS — Z884 Allergy status to anesthetic agent status: Secondary | ICD-10-CM | POA: Diagnosis not present

## 2024-01-07 DIAGNOSIS — I2510 Atherosclerotic heart disease of native coronary artery without angina pectoris: Secondary | ICD-10-CM | POA: Diagnosis present

## 2024-01-07 DIAGNOSIS — Z635 Disruption of family by separation and divorce: Secondary | ICD-10-CM

## 2024-01-07 DIAGNOSIS — M542 Cervicalgia: Secondary | ICD-10-CM | POA: Diagnosis present

## 2024-01-07 DIAGNOSIS — M353 Polymyalgia rheumatica: Secondary | ICD-10-CM | POA: Diagnosis present

## 2024-01-07 DIAGNOSIS — G479 Sleep disorder, unspecified: Secondary | ICD-10-CM | POA: Diagnosis present

## 2024-01-07 DIAGNOSIS — R6884 Jaw pain: Secondary | ICD-10-CM | POA: Diagnosis present

## 2024-01-07 DIAGNOSIS — Z885 Allergy status to narcotic agent status: Secondary | ICD-10-CM

## 2024-01-07 DIAGNOSIS — Z8261 Family history of arthritis: Secondary | ICD-10-CM

## 2024-01-07 DIAGNOSIS — G8929 Other chronic pain: Secondary | ICD-10-CM | POA: Diagnosis present

## 2024-01-07 DIAGNOSIS — I252 Old myocardial infarction: Secondary | ICD-10-CM

## 2024-01-07 DIAGNOSIS — I959 Hypotension, unspecified: Secondary | ICD-10-CM | POA: Diagnosis present

## 2024-01-07 DIAGNOSIS — Z841 Family history of disorders of kidney and ureter: Secondary | ICD-10-CM

## 2024-01-07 DIAGNOSIS — Z886 Allergy status to analgesic agent status: Secondary | ICD-10-CM | POA: Diagnosis not present

## 2024-01-07 DIAGNOSIS — Z825 Family history of asthma and other chronic lower respiratory diseases: Secondary | ICD-10-CM

## 2024-01-07 DIAGNOSIS — Z8041 Family history of malignant neoplasm of ovary: Secondary | ICD-10-CM

## 2024-01-07 DIAGNOSIS — Z8616 Personal history of COVID-19: Secondary | ICD-10-CM

## 2024-01-07 DIAGNOSIS — Z955 Presence of coronary angioplasty implant and graft: Secondary | ICD-10-CM

## 2024-01-07 DIAGNOSIS — Z803 Family history of malignant neoplasm of breast: Secondary | ICD-10-CM

## 2024-01-07 LAB — CBC
HCT: 39.7 % (ref 36.0–46.0)
Hemoglobin: 12.3 g/dL (ref 12.0–15.0)
MCH: 26.9 pg (ref 26.0–34.0)
MCHC: 31 g/dL (ref 30.0–36.0)
MCV: 86.7 fL (ref 80.0–100.0)
Platelets: 284 K/uL (ref 150–400)
RBC: 4.58 MIL/uL (ref 3.87–5.11)
RDW: 14.6 % (ref 11.5–15.5)
WBC: 10.5 K/uL (ref 4.0–10.5)
nRBC: 0 % (ref 0.0–0.2)

## 2024-01-07 LAB — ECHOCARDIOGRAM COMPLETE
AR max vel: 2.65 cm2
AV Area VTI: 2.57 cm2
AV Area mean vel: 2.58 cm2
AV Mean grad: 3 mmHg
AV Peak grad: 5.6 mmHg
Ao pk vel: 1.18 m/s
Area-P 1/2: 6.63 cm2
Height: 65 in
S' Lateral: 2.9 cm
Weight: 3440 [oz_av]

## 2024-01-07 LAB — BASIC METABOLIC PANEL WITH GFR
Anion gap: 8 (ref 5–15)
BUN: 16 mg/dL (ref 8–23)
CO2: 23 mmol/L (ref 22–32)
Calcium: 9 mg/dL (ref 8.9–10.3)
Chloride: 108 mmol/L (ref 98–111)
Creatinine, Ser: 0.96 mg/dL (ref 0.44–1.00)
GFR, Estimated: >60 mL/min (ref >60)
Glucose, Bld: 153 mg/dL — ABNORMAL HIGH (ref 70–99)
Potassium: 3.7 mmol/L (ref 3.5–5.1)
Sodium: 139 mmol/L (ref 135–145)

## 2024-01-07 LAB — TROPONIN I (HIGH SENSITIVITY)
Troponin I (High Sensitivity): 120 ng/L (ref <18)
Troponin I (High Sensitivity): 549 ng/L (ref <18)
Troponin I (High Sensitivity): 663 ng/L (ref <18)
Troponin I (High Sensitivity): 925 ng/L (ref <18)
Troponin I (High Sensitivity): 831 ng/L (ref <18)

## 2024-01-07 LAB — HEPARIN LEVEL (UNFRACTIONATED)
Heparin Unfractionated: 0.19 [IU]/mL — ABNORMAL LOW (ref 0.30–0.70)
Heparin Unfractionated: 0.28 [IU]/mL — ABNORMAL LOW (ref 0.30–0.70)

## 2024-01-07 LAB — MRSA NEXT GEN BY PCR, NASAL: MRSA by PCR Next Gen: NOT DETECTED

## 2024-01-07 LAB — D-DIMER, QUANTITATIVE: D-Dimer, Quant: <0.27 ug{FEU}/mL (ref 0.00–0.50)

## 2024-01-07 MED ORDER — FAMOTIDINE 20 MG PO TABS
20.0000 mg | ORAL_TABLET | Freq: Every day | ORAL | Status: DC
  Administered 2024-01-07 – 2024-01-09 (×3): 20 mg via ORAL
  Filled 2024-01-07 (×3): qty 1

## 2024-01-07 MED ORDER — HEPARIN BOLUS VIA INFUSION
4000.0000 [IU] | Freq: Once | INTRAVENOUS | Status: AC
  Administered 2024-01-07: 4000 [IU] via INTRAVENOUS
  Filled 2024-01-07: qty 4000

## 2024-01-07 MED ORDER — PANTOPRAZOLE SODIUM 40 MG PO TBEC
40.0000 mg | DELAYED_RELEASE_TABLET | Freq: Every day | ORAL | Status: DC
  Administered 2024-01-07 – 2024-01-10 (×4): 40 mg via ORAL
  Filled 2024-01-07 (×4): qty 1

## 2024-01-07 MED ORDER — METOPROLOL TARTRATE 25 MG PO TABS
25.0000 mg | ORAL_TABLET | Freq: Two times a day (BID) | ORAL | Status: DC
  Administered 2024-01-07 – 2024-01-10 (×7): 25 mg via ORAL
  Filled 2024-01-07 (×7): qty 1

## 2024-01-07 MED ORDER — ONDANSETRON HCL 4 MG/2ML IJ SOLN
4.0000 mg | Freq: Four times a day (QID) | INTRAMUSCULAR | Status: DC | PRN
Start: 2024-01-07 — End: 2024-01-09

## 2024-01-07 MED ORDER — ONDANSETRON HCL 4 MG PO TABS
4.0000 mg | ORAL_TABLET | Freq: Four times a day (QID) | ORAL | Status: DC | PRN
Start: 2024-01-07 — End: 2024-01-09

## 2024-01-07 MED ORDER — PERFLUTREN LIPID MICROSPHERE
1.0000 mL | INTRAVENOUS | Status: AC | PRN
  Administered 2024-01-07: 5 mL via INTRAVENOUS

## 2024-01-07 MED ORDER — ATORVASTATIN CALCIUM 40 MG PO TABS: 40.0000 mg | ORAL_TABLET | Freq: Every day | ORAL | Status: DC

## 2024-01-07 MED ORDER — MORPHINE SULFATE (PF) 2 MG/ML IV SOLN: 1.0000 mg | INTRAVENOUS | Status: DC | PRN

## 2024-01-07 MED ORDER — HEPARIN (PORCINE) 25000 UT/250ML-% IV SOLN
1300.0000 [IU]/h | INTRAVENOUS | Status: DC
  Administered 2024-01-07: 1300 [IU]/h via INTRAVENOUS
  Administered 2024-01-07: 900 [IU]/h via INTRAVENOUS
  Administered 2024-01-08: 1300 [IU]/h via INTRAVENOUS
  Filled 2024-01-07 (×3): qty 250

## 2024-01-07 MED ORDER — ASPIRIN 81 MG PO TBEC
81.0000 mg | DELAYED_RELEASE_TABLET | Freq: Every day | ORAL | Status: DC
  Administered 2024-01-07 – 2024-01-10 (×3): 81 mg via ORAL
  Filled 2024-01-07 (×3): qty 1

## 2024-01-07 MED ORDER — ONDANSETRON HCL 4 MG/2ML IJ SOLN: 4.0000 mg | Freq: Four times a day (QID) | INTRAMUSCULAR | Status: DC | PRN

## 2024-01-07 MED ORDER — MELATONIN 3 MG PO TABS
6.0000 mg | ORAL_TABLET | Freq: Every day | ORAL | Status: DC
  Administered 2024-01-07 – 2024-01-09 (×3): 6 mg via ORAL
  Filled 2024-01-07 (×3): qty 2

## 2024-01-07 MED ORDER — MAGNESIUM OXIDE -MG SUPPLEMENT 400 (240 MG) MG PO TABS
400.0000 mg | ORAL_TABLET | Freq: Every day | ORAL | Status: DC
  Administered 2024-01-07 – 2024-01-09 (×3): 400 mg via ORAL
  Filled 2024-01-07 (×3): qty 1

## 2024-01-07 MED ORDER — ACETAMINOPHEN 650 MG RE SUPP: 650.0000 mg | Freq: Four times a day (QID) | RECTAL | Status: DC | PRN

## 2024-01-07 MED ORDER — NITROGLYCERIN 0.4 MG SL SUBL
0.4000 mg | SUBLINGUAL_TABLET | SUBLINGUAL | Status: DC | PRN
  Administered 2024-01-08: 0.4 mg via SUBLINGUAL
  Filled 2024-01-07: qty 1

## 2024-01-07 MED ORDER — HEPARIN BOLUS VIA INFUSION
2000.0000 [IU] | Freq: Once | INTRAVENOUS | Status: AC
  Administered 2024-01-07: 2000 [IU] via INTRAVENOUS
  Filled 2024-01-07: qty 2000

## 2024-01-07 MED ORDER — ACETAMINOPHEN 325 MG PO TABS
650.0000 mg | ORAL_TABLET | Freq: Four times a day (QID) | ORAL | Status: DC | PRN
  Administered 2024-01-07 – 2024-01-08 (×3): 650 mg via ORAL
  Filled 2024-01-07 (×3): qty 2

## 2024-01-07 MED ORDER — EZETIMIBE 10 MG PO TABS
10.0000 mg | ORAL_TABLET | Freq: Every day | ORAL | Status: DC
  Administered 2024-01-07 – 2024-01-10 (×4): 10 mg via ORAL
  Filled 2024-01-07 (×4): qty 1

## 2024-01-07 NOTE — Hospital Course (Signed)
 [place] 68 year old female medical history of TMJ surgery, history of PFO repair with closure device April 2008, SVT/PSVT/PAT  on sotalol  and aspirin , essential hypertension, Bell's palsy, gout, , reactive airway disease, DVT not currently on anticoagulation and GERD presented to emergency department complaining of  chest pain for 1 day that radiates to the jaw and associated shortness of breath.  While in the ED patient chest pain improved with sublingual nitroglycerin .  Patient received aspirin  324 mg.  At presentation to ED patient is hemodynamically stable. First troponin 120 and pending second troponin level.  Normal D-dimer level. CBC unremarkable. BMP unremarkable. EKG showed normal sinus rhythm heart rate 70. Chest x-ray no active disease process.  ED physician consulted and discussed case with on-call cardiology Dr. Donnel recommended to start heparin  drip and will evaluate patient soon.  In the ED heparin  drip has been initiated.  Hospitalist has been consulted for management for NSTEMI.

## 2024-01-07 NOTE — ED Triage Notes (Signed)
 Pt bibgcems from c/o jaw pain on both sides x2 days with pain in left shoulder and chest. Two hours ago pt had 9/10 jaw pain and chest pain. Pt states sob on exertion.  324 asa  2 doses of nitroglycerin  at 0.4 mg   Given with ems and pain subsided Bp 127/58 76 hr  14 rr

## 2024-01-07 NOTE — Progress Notes (Signed)
 PHARMACY - ANTICOAGULATION CONSULT NOTE  Pharmacy Consult for heparin  Indication: chest pain/ACS  Allergies  Allergen Reactions   2,4-D Dimethylamine Other (See Comments)    Other Reaction: INDUCES SEIZURES   Codeine Nausea And Vomiting    Patient reports severe nausea and vomiting.   Hydrocodone-Acetaminophen  Nausea And Vomiting    Patient reports severe nausea and vomiting.   Morphine  Nausea And Vomiting    Patient reports severe nausea and vomiting.   Nitrofurantoin Monohyd Macro Rash   Ciprofloxacin     Increased risk for seizure   Erythromycin Base Diarrhea    Patient reports severe diarrhea.   Hydrocodone Other (See Comments)   Oxycodone  Other (See Comments)   Pamelor [Nortriptyline Hcl]     seizure   Stadol [Butorphanol] Nausea And Vomiting   Talwin [Pentazocine] Nausea And Vomiting   Topamax [Topiramate] Other (See Comments)    Abdominal pain    Wellbutrin [Bupropion] Other (See Comments)    seizure   Zanaflex [Tizanidine Hcl] Other (See Comments)    hallucination   Lamictal [Lamotrigine] Rash   Macrobid [Nitrofurantoin Macrocrystal] Rash    Patient Measurements: Height: 5' 5 (165.1 cm) Weight: 97.5 kg (215 lb) IBW/kg (Calculated) : 57 HEPARIN  DW (KG): 79.1  Vital Signs: Temp: 98.6 F (37 C) (08/02 0145) BP: 121/67 (08/02 0345) Pulse Rate: 65 (08/02 0345)  Labs: Recent Labs    01/07/24 0148  HGB 12.3  HCT 39.7  PLT 284  CREATININE 0.96  TROPONINIHS 120*    Estimated Creatinine Clearance: 66.6 mL/min (by C-G formula based on SCr of 0.96 mg/dL).  Assessment: Tracey Wiley presented to ED with jaw/shoulder/chest pain and SOB on exertion. Pharmacy consulted to dose heparin  for ACS. PMH includes sotalol  for arrhythmia management, no PTA anticoagulation reported  -No PTA anticoagulation reported  -CBC stable; trops 120  Goal of Therapy:  Heparin  level 0.3-0.7 units/ml Monitor platelets by anticoagulation protocol: Yes   Plan:  Give 4000 units bolus  x 1 Start heparin  infusion at 900 units/hr Check anti-Xa level in 6 hours and daily while on heparin  Continue to monitor H&H and platelets  Lynwood Poplar, PharmD, BCPS Clinical Pharmacist 01/07/2024 4:14 AM

## 2024-01-07 NOTE — ED Notes (Signed)
 Pt ambulated to the bathroom.
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
## 2024-01-07 NOTE — Plan of Care (Signed)
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
## 2024-01-07 NOTE — ED Provider Notes (Signed)
 [place] EMERGENCY DEPARTMENT AT [facility] Provider Note   CSN: [id number] Arrival date & time: 01/07/24  0136    Patient presents with: Chest Pain   Tracey Wiley is a 68 y.o. female with multiple medical comorbidities here for jaw pain, CP x days. Jaw pain since Wed, constant, hx of TMJ w/ prior surgery however felt different. Jaw pain resolved yesterday, returned today along with CP today, stabbing feeling like twisting of the chest. SOB with activity tonight. No fever, N/V, abd pain. Some decreased appetite. Has some reflux chronically, on Pepcid . No Abf pain, does not feel like her typical reflux. No palpitations. Chronic LE edema, at baseline. 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 Not currently on anticoagulation  CP improved with nitroglycerin . Currently CP free.  ASA 324 PTA  Prior PFO- surgically corrected Coronary calcium  score 0 in 2022      HPI     Prior to Admission medications   Medication Sig Start Date End Date Taking? Authorizing Provider  acetaminophen  (TYLENOL ) 500 MG tablet Take 500 mg by mouth daily as needed for pain.   Yes [provider]  aspirin  EC 81 MG tablet Take 81 mg by mouth daily.   Yes [provider]  cholecalciferol (VITAMIN D3) 25 MCG (1000 UNIT) tablet Take 1,000 Units by mouth daily.   Yes [provider]  esomeprazole  (NEXIUM ) 40 MG capsule Take 1 capsule (40 mg total) by mouth daily. 12/22/23  Yes Heinz, Camie FORBES, PA-C  ezetimibe  (ZETIA ) 10 MG tablet TAKE 1 TABLET BY MOUTH EVERY DAY 11/03/23  Yes Gollan, Timothy J, MD  famotidine  (PEPCID ) 20 MG tablet Take 1 tablet (20 mg total) by mouth at bedtime. 12/22/23  Yes Heinz, Sara E, PA-C  Flaxseed, Linseed, (FLAX SEED OIL) 1000 MG CAPS Take 1 capsule by mouth at bedtime.   Yes [provider]  furosemide  (LASIX ) 20 MG tablet TAKE 1 TABLET BY MOUTH EVERY DAY AS NEEDED Patient taking differently: Take 20 mg by mouth daily. 11/03/23  Yes Gollan,  Timothy J, MD  magnesium  gluconate (MAGONATE) 500 MG tablet Take 500 mg by mouth at bedtime.   Yes [provider]  MELATONIN ER PO Take 6 mg by mouth at bedtime.   Yes [provider]  Omega-3 Fatty Acids (OMEGA-3 FISH OIL PO) Take 1 capsule by mouth daily.   Yes [provider]  potassium chloride  (KLOR-CON ) 10 MEQ tablet TAKE 1 TABLET (10 MEQ TOTAL) BY MOUTH DAILY AS NEEDED. Patient taking differently: Take 10 mEq by mouth daily. 11/03/23  Yes Gollan, Timothy J, MD  sotalol  (BETAPACE ) 80 MG tablet TAKE 1 TABLET BY MOUTH TWICE A DAY Patient taking differently: Take 40 mg by mouth every 6 (six) hours. 09/29/23  Yes Gollan, Timothy J, MD  vitamin C (ASCORBIC ACID) 500 MG tablet Take 1,000 mg by mouth daily.   Yes [provider]  zinc  gluconate 50 MG tablet Take 50 mg by mouth daily.   Yes [provider]  zinc  oxide (MEIJER ZINC  OXIDE) 20 % ointment Apply 1 Application topically 2 (two) times daily as needed for irritation (to lip corners). 08/19/23  Yes Marylynn Verneita CROME, MD  cetirizine  (ZYRTEC ) 10 MG tablet TAKE 1 TABLET BY MOUTH EVERY DAY Patient not taking: Reported on 01/07/2024 07/18/23   Soldatova, Liuba, MD  colchicine  0.6 MG tablet TAKE 1 TABLET (0.6 MG TOTAL) BY MOUTH 2 (TWO) TIMES DAILY AS NEEDED. Patient not taking: Reported on 01/07/2024  11/21/23   Marylynn Verneita CROME, MD  famciclovir  (FAMVIR ) 500 MG tablet Take 1 tablet (500 mg total) by mouth 3 (three) times daily. Patient not taking: Reported on 01/07/2024 08/26/23   Tullo, Teresa L, MD  ipratropium (ATROVENT ) 0.06 % nasal spray USE 1 SPRAY IN EACH NOSTRIL 1-2 TIMES DAILY AS NEEDED TO CONTROL DRAINAGE Patient not taking: Reported on 01/07/2024 09/05/23   Jeneal Danita Macintosh, MD  levalbuterol  (XOPENEX  HFA) 45 MCG/ACT inhaler Inhale 2 puffs into the lungs every 4 (four) hours as needed for wheezing. Patient not taking: Reported on 01/07/2024 08/11/23   Jeneal Danita Macintosh, MD  ondansetron  (ZOFRAN   ODT) 4 MG disintegrating tablet Take 1 tablet (4 mg total) by mouth every 8 (eight) hours as needed for nausea or vomiting. Patient not taking: Reported on 01/07/2024 02/06/21   Tullo, Teresa L, MD  Pitavastatin  Calcium  2 MG TABS TAKE 1 TABLET BY MOUTH EVERY DAY Patient not taking: Reported on 01/07/2024 10/20/23   Gollan, Timothy J, MD  promethazine  (PHENERGAN ) 12.5 MG tablet Take 12.5 mg by mouth every 6 (six) hours as needed for nausea or vomiting. Patient not taking: Reported on 01/07/2024    [provider]  sucralfate  (CARAFATE ) 1 g tablet Take 1 tablet (1 g total) by mouth 2 (two) times daily. Place 1 tablet in 10 ml of warm water  and drink twice daily as needed. Patient not taking: Reported on 01/07/2024 12/22/23   Arletta Camie BRAVO, PA-C    Allergies: 2,4-d dimethylamine; Codeine; Hydrocodone-acetaminophen ; Morphine ; Nitrofurantoin monohyd macro; Ciprofloxacin; Erythromycin base; Hydrocodone; Oxycodone ; Pamelor [nortriptyline hcl]; Stadol [butorphanol]; Talwin [pentazocine]; Topamax [topiramate]; Wellbutrin [bupropion]; Zanaflex [tizanidine hcl]; Lamictal [lamotrigine]; and Macrobid [nitrofurantoin macrocrystal]    Review of Systems  Constitutional: Negative.   HENT: Negative.    Respiratory: Negative.    Cardiovascular:  Positive for chest pain. Negative for palpitations and leg swelling.  Gastrointestinal: Negative.   Genitourinary: Negative.   Musculoskeletal: Negative.   Skin: Negative.   Neurological: Negative.   All other systems reviewed and are negative.   Updated Vital Signs BP 121/67   Pulse 65   Temp 98.6 F (37 C)   Resp (!) 23   Ht 5' 5 (1.651 m)   Wt 97.5 kg   SpO2 100%   BMI 35.78 kg/m   Physical Exam Vitals and nursing note reviewed.  Constitutional:      General: She is not in acute distress.    Appearance: She is well-developed. She is not ill-appearing, toxic-appearing or diaphoretic.  HENT:     Head: Atraumatic.  Eyes:     Pupils: Pupils are equal,  round, and reactive to light.  Cardiovascular:     Rate and Rhythm: Normal rate.     Pulses:          Radial pulses are 2+ on the right side and 2+ on the left side.       Dorsalis pedis pulses are 2+ on the right side and 2+ on the left side.     Heart sounds: Normal heart sounds.  Pulmonary:     Effort: Pulmonary effort is normal. No respiratory distress.     Breath sounds: Normal breath sounds.  Chest:     Chest wall: No mass, deformity, tenderness, crepitus or edema.  Abdominal:     General: Bowel sounds are normal. There is no distension or abdominal bruit.     Palpations: Abdomen is soft. There is no fluid wave or splenomegaly.     Tenderness: There  is no abdominal tenderness. There is no guarding or rebound.  Musculoskeletal:        General: Normal range of motion.     Cervical back: Normal range of motion.     Right lower leg: No tenderness. No edema.     Left lower leg: No tenderness. No edema.  Skin:    General: Skin is warm and dry.     Capillary Refill: Capillary refill takes less than 2 seconds.  Neurological:     General: No focal deficit present.     Mental Status: She is alert.  Psychiatric:        Mood and Affect: Mood normal.     (all labs ordered are listed, but only abnormal results are displayed) Labs Reviewed  BASIC METABOLIC PANEL WITH GFR - Abnormal; Notable for the following components:      Result Value   Glucose, Bld 153 (*)    All other components within normal limits  TROPONIN I (HIGH SENSITIVITY) - Abnormal; Notable for the following components:   Troponin I (High Sensitivity) 120 (*)    All other components within normal limits  TROPONIN I (HIGH SENSITIVITY) - Abnormal; Notable for the following components:   Troponin I (High Sensitivity) 549 (*)    All other components within normal limits  CBC  D-DIMER, QUANTITATIVE  HEPARIN  LEVEL (UNFRACTIONATED)    EKG: None  Radiology: DG Chest 2 View Result Date: 01/07/2024 EXAM: 2 VIEW(S) XRAY  OF THE CHEST 01/07/2024 03:21:00 AM COMPARISON: 01/27/2023 CLINICAL HISTORY: Chest pain. FINDINGS: LUNGS AND PLEURA: No focal pulmonary opacity. No pulmonary edema. No pleural effusion. No pneumothorax. HEART AND MEDIASTINUM: No acute abnormality of the cardiac and mediastinal silhouettes. BONES AND SOFT TISSUES: No acute osseous abnormality. IMPRESSION: 1. No acute process. Electronically signed by: Franky Stanford MD 01/07/2024 03:41 AM EDT RP Workstation: [id number]     .Critical Care  Performed by: Edie Rosebud LABOR, PA-C Authorized by: Edie Rosebud LABOR, PA-C   Critical care provider statement:    Critical care time (minutes):  35   Critical care was necessary to treat or prevent imminent or life-threatening deterioration of the following conditions:  Cardiac failure   Critical care was time spent personally by me on the following activities:  Development of treatment plan with patient or surrogate, discussions with consultants, evaluation of patient's response to treatment, examination of patient, ordering and review of laboratory studies, ordering and review of radiographic studies, ordering and performing treatments and interventions, pulse oximetry, re-evaluation of patient's condition and review of old charts    Medications Ordered in the ED  heparin  ADULT infusion 100 units/mL (25000 units/250mL) (900 Units/hr Intravenous New Bag/Given 01/07/24 0454)  heparin  bolus via infusion 4,000 Units (4,000 Units Intravenous Bolus from Bag 01/07/24 3143)    68 year old with multiple medical problems here for evaluation of jaw pain and chest pain.  Jaw pain for a few days, subsequently resolved however returned today with chest pain and shortness of breath while washing the dishes.  Took ASA and nitro with resolution of her pain.  Here she is neurovascularly intact.  History of prior DVT after surgery, not currently anticoagulated however feels different.  Plan on labs, imaging and reassess  Labs and  imaging personally viewed and interpreted:  Troponin 120 D-dimer less than 0.27 CBC without significant abnormality Metabolic panel without significant abnormality Chest x-ray without cardiomegaly, edema, pneumothorax, infiltrates, widened mediastinum EKG wo ischemic changes  Patient reassessed.  We discussed labs and imaging.  Will  start on heparin  for NSTEMI.  Will discuss with cardiology.  CONSULT with Dr. Donnel with Cards who rec medicine admit, start heparin   Consult with Dr. Sundil with medicine team is agreeable to eval patient for admission.  Patient updated at bedside.  Agreeable for heparin .  Low suspicion for acute bacterial infectious process, PE, dissection    The patient appears reasonably stabilized for admission considering the current resources, flow, and capabilities available in the ED at this time, and I doubt any other [facility] requiring further screening and/or treatment in the ED prior to admission.      Clinical Course as of 01/07/24 0516  Sat Jan 07, 2024  0411 Dr. Sammuel with Cards rec medicine admit, Cards will consult. Heparin  start [BH]  0428 Dr. Sundil with medicine for admission [BH]    Clinical Course User Index [BH] Terry Abila A, PA-C                                 Medical Decision Making Amount and/or Complexity of Data Reviewed Independent Historian: EMS External Data Reviewed: labs, radiology, ECG and notes. Labs: ordered. Decision-making details documented in ED Course. Radiology: ordered and independent interpretation performed. Decision-making details documented in ED Course. ECG/medicine tests: ordered and independent interpretation performed. Decision-making details documented in ED Course.  Risk OTC drugs. Prescription drug management. Parenteral controlled substances. Decision regarding hospitalization. Diagnosis or treatment significantly limited by social determinants of health.       Final diagnoses:   NSTEMI (non-ST elevated myocardial infarction) [facility])    ED Discharge Orders     None          Marke Goodwyn A, PA-C 01/07/24 0516    Bari Charmaine FALCON, MD 01/08/24 [phone]

## 2024-01-08 DIAGNOSIS — I214 Non-ST elevation (NSTEMI) myocardial infarction: Secondary | ICD-10-CM | POA: Diagnosis not present

## 2024-01-08 LAB — COMPREHENSIVE METABOLIC PANEL WITH GFR
ALT: 15 U/L (ref 0–44)
AST: 25 U/L (ref 15–41)
Albumin: 3 g/dL — ABNORMAL LOW (ref 3.5–5.0)
Alkaline Phosphatase: 69 U/L (ref 38–126)
Anion gap: 8 (ref 5–15)
BUN: 11 mg/dL (ref 8–23)
CO2: 22 mmol/L (ref 22–32)
Calcium: 8.6 mg/dL — ABNORMAL LOW (ref 8.9–10.3)
Chloride: 108 mmol/L (ref 98–111)
Creatinine, Ser: 0.78 mg/dL (ref 0.44–1.00)
GFR, Estimated: >60 mL/min (ref >60)
Glucose, Bld: 134 mg/dL — ABNORMAL HIGH (ref 70–99)
Potassium: 3.9 mmol/L (ref 3.5–5.1)
Sodium: 138 mmol/L (ref 135–145)
Total Bilirubin: 0.5 mg/dL (ref 0.0–1.2)
Total Protein: 6.9 g/dL (ref 6.5–8.1)

## 2024-01-08 LAB — CBC
HCT: 37.6 % (ref 36.0–46.0)
Hemoglobin: 12 g/dL (ref 12.0–15.0)
MCH: 26.8 pg (ref 26.0–34.0)
MCHC: 31.9 g/dL (ref 30.0–36.0)
MCV: 83.9 fL (ref 80.0–100.0)
Platelets: 250 K/uL (ref 150–400)
RBC: 4.48 MIL/uL (ref 3.87–5.11)
RDW: 14.6 % (ref 11.5–15.5)
WBC: 8.4 K/uL (ref 4.0–10.5)
nRBC: 0 % (ref 0.0–0.2)

## 2024-01-08 LAB — LIPID PANEL
Cholesterol: 213 mg/dL — ABNORMAL HIGH (ref 0–200)
HDL: 43 mg/dL (ref >40)
LDL Cholesterol: 133 mg/dL — ABNORMAL HIGH (ref 0–99)
Total CHOL/HDL Ratio: 5 ratio
Triglycerides: 185 mg/dL — ABNORMAL HIGH (ref <150)
VLDL: 37 mg/dL (ref 0–40)

## 2024-01-08 LAB — HEPARIN LEVEL (UNFRACTIONATED): Heparin Unfractionated: 0.43 [IU]/mL (ref 0.30–0.70)

## 2024-01-08 MED ORDER — FREE WATER
500.0000 mL | Freq: Once | Status: AC
  Administered 2024-01-09: 500 mL via ORAL

## 2024-01-08 MED ORDER — POTASSIUM CHLORIDE CRYS ER 10 MEQ PO TBCR
10.0000 meq | EXTENDED_RELEASE_TABLET | Freq: Once | ORAL | Status: AC
  Administered 2024-01-08: 10 meq via ORAL
  Filled 2024-01-08: qty 1

## 2024-01-08 MED ORDER — FUROSEMIDE 20 MG PO TABS
20.0000 mg | ORAL_TABLET | Freq: Once | ORAL | Status: AC
  Administered 2024-01-08: 20 mg via ORAL
  Filled 2024-01-08: qty 1

## 2024-01-08 MED ORDER — ASPIRIN 81 MG PO CHEW
81.0000 mg | CHEWABLE_TABLET | ORAL | Status: AC
  Administered 2024-01-09: 81 mg via ORAL
  Filled 2024-01-08: qty 1

## 2024-01-08 NOTE — Progress Notes (Signed)
  Progress Note  Patient Name: Tracey Wiley Date of Encounter: 01/08/2024 [facility] Cardiologist: Evalene Lunger, MD   Interval Summary   Transferred from ED to [facility]. No acute overnight events. Patient reports feeling relatively well. No new or acute complaints.   Vital Signs Vitals:   01/07/24 1959 01/07/24 2331 01/08/24 0531 01/08/24 0736  BP: 115/71 135/75 134/81   Pulse: 76 72 72 81  Resp: 20 18 19    Temp: 98.4 F (36.9 C) 97.8 F (36.6 C) 98 F (36.7 C) 98.3 F (36.8 C)  TempSrc: Oral Oral Oral Oral  SpO2: 97% 100% 98% 99%  Weight:      Height:        Intake/Output Summary (Last 24 hours) at 01/08/2024 0838 Last data filed at 01/08/2024 0500 Gross per 24 hour  Intake 243.85 ml  Output 2 ml  Net 241.85 ml      01/07/2024    3:22 PM 01/07/2024    1:45 AM 12/22/2023    1:48 PM  Last 3 Weights  Weight (lbs) 216 lb 1.6 oz 215 lb 216 lb  Weight (kg) 98.022 kg 97.523 kg 97.977 kg      Telemetry/ECG  SR, no VAs - Personally Reviewed  Physical Exam  General: Well developed, in no acute distress.  Neck: No JVD.  Cardiac: Normal rate, regular rhythm.  Resp: Normal work of breathing.  Ext: No edema.  Neuro: No gross focal deficits.  Psych: Normal affect.   Echo 01/07/24:   1. No LV thrombus by Definity  but there is swirling pattern in the apex.  Left ventricular ejection fraction, by estimation, is 55 to 60%. The left  ventricle has normal function. The left ventricle demonstrates regional  wall motion abnormalities (see  scoring diagram/findings for description). Left ventricular diastolic  parameters are consistent with Grade I diastolic dysfunction (impaired  relaxation).   2. Right ventricular systolic function is normal. The right ventricular  size is mildly enlarged. Tricuspid regurgitation signal is inadequate for  assessing PA pressure.   3. The mitral valve is normal in structure. No evidence of mitral valve  regurgitation. No evidence of mitral  stenosis.   4. The aortic valve was not well visualized. Aortic valve regurgitation  is not visualized. No aortic stenosis is present.   5. The inferior vena cava is dilated in size but collapsibility could not  be evaluated.   Assessment & Plan  Tracey Wiley is a 68 y.o. female with a hx of PFO with closure device 2008, hypertension hyperlipidemia, paroxysmal A-fib versus paroxysmal atrial tachycardia on sotalol , hypertension, Bell's palsy, rheumatoid arthritis and myalgia rheumatica who is admitted with NSTEMI. Need to r/o type I given presence of CP, regional WMAs on echo and elevated troponin. Other consideration would be Takotsubo. She reports a great deal of emotional stress.    NSTEMI Hypertension. Hyperlipidemia. Rheumatoid arthritis and polymyalgia rheumatica Paroxysmal SVT/atrial tachycardia on sotalol . ? Afib History of PFO closure     Plan:  - LHC tomorrow. - Heparin  gtt.  - Aspirin  81mg  daily.  - Nitroglycerin  infusion if CP recurs.  - Continue Zetia  10mg . Has not been able to tolerate statins. May need PCSK9 inhibitor as outpatient.   For questions or updates, please contact [facility] Please consult [email or web address] for contact info under       Signed, Fonda Kitty, MD

## 2024-01-08 NOTE — Progress Notes (Signed)
 Pt having jaw pain she states feels is similar to what brought her here returning. Pt has visible sweat on skin MD paged through amnion [id number] no response nitroglycerin  given EKG taken  Pt states nitroglycerin  helped jaw pain 1915 RN paged MD for further instruction
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
## 2024-01-08 NOTE — Plan of Care (Signed)
  Problem: Education: Goal: Knowledge of General Education information will improve Description: Including pain rating scale, medication(s)/side effects and non-pharmacologic comfort measures Outcome: Progressing   Problem: Health Behavior/Discharge Planning: Goal: Ability to manage health-related needs will improve Outcome: Progressing   Problem: Clinical Measurements: Goal: Ability to maintain clinical measurements within normal limits will improve Outcome: Progressing Goal: Will remain free from infection Outcome: Progressing Goal: Diagnostic test results will improve Outcome: Progressing   Problem: Clinical Measurements: Goal: Ability to maintain clinical measurements within normal limits will improve Outcome: Progressing Goal: Will remain free from infection Outcome: Progressing Goal: Diagnostic test results will improve Outcome: Progressing   Problem: Health Behavior/Discharge Planning: Goal: Ability to manage health-related needs will improve Outcome: Progressing

## 2024-01-08 NOTE — Plan of Care (Signed)
  Problem: Pain Managment: Goal: General experience of comfort will improve and/or be controlled Outcome: Progressing   Problem: Safety: Goal: Ability to remain free from injury will improve Outcome: Progressing   Problem: Skin Integrity: Goal: Risk for impaired skin integrity will decrease Outcome: Progressing
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
## 2024-01-09 ENCOUNTER — Ambulatory Visit

## 2024-01-09 ENCOUNTER — Encounter: Payer: Self-pay | Admitting: Cardiovascular Disease

## 2024-01-09 ENCOUNTER — Encounter (HOSPITAL_COMMUNITY): Payer: Self-pay | Admitting: Internal Medicine

## 2024-01-09 ENCOUNTER — Encounter (HOSPITAL_COMMUNITY)
Admission: EM | Disposition: A | Discharge status: Home / Self Care | Payer: Self-pay | Source: Home / Self Care | Attending: Family Medicine

## 2024-01-09 ENCOUNTER — Other Ambulatory Visit: Payer: Self-pay

## 2024-01-09 DIAGNOSIS — I2510 Atherosclerotic heart disease of native coronary artery without angina pectoris: Secondary | ICD-10-CM

## 2024-01-09 DIAGNOSIS — I214 Non-ST elevation (NSTEMI) myocardial infarction: Secondary | ICD-10-CM | POA: Diagnosis not present

## 2024-01-09 HISTORY — PX: CORONARY STENT INTERVENTION: CATH118234

## 2024-01-09 HISTORY — PX: LEFT HEART CATH AND CORONARY ANGIOGRAPHY: CATH118249

## 2024-01-09 LAB — CBC
HCT: 38.3 % (ref 36.0–46.0)
Hemoglobin: 12.2 g/dL (ref 12.0–15.0)
MCH: 27.2 pg (ref 26.0–34.0)
MCHC: 31.9 g/dL (ref 30.0–36.0)
MCV: 85.3 fL (ref 80.0–100.0)
Platelets: 238 K/uL (ref 150–400)
RBC: 4.49 MIL/uL (ref 3.87–5.11)
RDW: 14.7 % (ref 11.5–15.5)
WBC: 8.2 K/uL (ref 4.0–10.5)
nRBC: 0 % (ref 0.0–0.2)

## 2024-01-09 LAB — LIPID PANEL
Cholesterol: 217 mg/dL — ABNORMAL HIGH (ref 0–200)
HDL: 40 mg/dL — ABNORMAL LOW (ref >40)
LDL Cholesterol: 134 mg/dL — ABNORMAL HIGH (ref 0–99)
Total CHOL/HDL Ratio: 5.4 ratio
Triglycerides: 214 mg/dL — ABNORMAL HIGH (ref <150)
VLDL: 43 mg/dL — ABNORMAL HIGH (ref 0–40)

## 2024-01-09 LAB — POCT ACTIVATED CLOTTING TIME
Activated Clotting Time: 285 s
Activated Clotting Time: 291 s
Activated Clotting Time: 308 s

## 2024-01-09 LAB — BASIC METABOLIC PANEL WITH GFR
Anion gap: 9 (ref 5–15)
BUN: 14 mg/dL (ref 8–23)
CO2: 26 mmol/L (ref 22–32)
Calcium: 8.8 mg/dL — ABNORMAL LOW (ref 8.9–10.3)
Chloride: 105 mmol/L (ref 98–111)
Creatinine, Ser: 0.84 mg/dL (ref 0.44–1.00)
GFR, Estimated: >60 mL/min (ref >60)
Glucose, Bld: 117 mg/dL — ABNORMAL HIGH (ref 70–99)
Potassium: 4.1 mmol/L (ref 3.5–5.1)
Sodium: 140 mmol/L (ref 135–145)

## 2024-01-09 LAB — GLUCOSE, CAPILLARY: Glucose-Capillary: 120 mg/dL — ABNORMAL HIGH (ref 70–99)

## 2024-01-09 LAB — HEMOGLOBIN A1C
Hgb A1c MFr Bld: 6.1 % — ABNORMAL HIGH (ref 4.8–5.6)
Mean Plasma Glucose: 128 mg/dL

## 2024-01-09 LAB — HEPARIN LEVEL (UNFRACTIONATED): Heparin Unfractionated: 0.35 [IU]/mL (ref 0.30–0.70)

## 2024-01-09 SURGERY — LEFT HEART CATH AND CORONARY ANGIOGRAPHY
Anesthesia: LOCAL

## 2024-01-09 MED ORDER — SODIUM CHLORIDE 0.9% FLUSH: 3.0000 mL | INTRAVENOUS | Status: DC | PRN

## 2024-01-09 MED ORDER — PRASUGREL HCL 10 MG PO TABS
ORAL_TABLET | ORAL | Status: AC
  Filled 2024-01-09: qty 6

## 2024-01-09 MED ORDER — HEPARIN SODIUM (PORCINE) 1000 UNIT/ML IJ SOLN
INTRAMUSCULAR | Status: AC
Start: 2024-01-09 — End: 2024-01-09
  Filled 2024-01-09: qty 10

## 2024-01-09 MED ORDER — SODIUM CHLORIDE 0.9% FLUSH
3.0000 mL | Freq: Two times a day (BID) | INTRAVENOUS | Status: DC
  Administered 2024-01-09 – 2024-01-10 (×2): 3 mL via INTRAVENOUS

## 2024-01-09 MED ORDER — IOHEXOL 350 MG/ML SOLN
INTRAVENOUS | Status: DC | PRN
  Administered 2024-01-09: 128 mL via INTRA_ARTERIAL

## 2024-01-09 MED ORDER — LIDOCAINE HCL (PF) 1 % IJ SOLN
INTRAMUSCULAR | Status: AC
Start: 2024-01-09 — End: 2024-01-09
  Filled 2024-01-09: qty 30

## 2024-01-09 MED ORDER — FREE WATER
500.0000 mL | Freq: Once | Status: AC
  Administered 2024-01-09: 500 mL via ORAL

## 2024-01-09 MED ORDER — NITROGLYCERIN 1 MG/10 ML FOR IR/CATH LAB
INTRA_ARTERIAL | Status: DC | PRN
  Administered 2024-01-09 (×3): 200 ug via INTRACORONARY

## 2024-01-09 MED ORDER — MIDAZOLAM HCL 2 MG/2ML IJ SOLN
INTRAMUSCULAR | Status: DC | PRN
  Administered 2024-01-09 (×2): 1 mg via INTRAVENOUS

## 2024-01-09 MED ORDER — HEPARIN (PORCINE) IN NACL 1000-0.9 UT/500ML-% IV SOLN
INTRAVENOUS | Status: DC | PRN
Start: 2024-01-09 — End: 2024-01-09
  Administered 2024-01-09 (×2): 500 mL

## 2024-01-09 MED ORDER — FENTANYL CITRATE (PF) 100 MCG/2ML IJ SOLN
INTRAMUSCULAR | Status: AC
Start: 2024-01-09 — End: 2024-01-09
  Filled 2024-01-09: qty 2

## 2024-01-09 MED ORDER — PROMETHAZINE (PHENERGAN) 6.25MG IN NS 50ML IVPB
6.2500 mg | Freq: Four times a day (QID) | INTRAVENOUS | Status: DC | PRN
  Administered 2024-01-09: 6.25 mg via INTRAVENOUS
  Filled 2024-01-09: qty 6.25

## 2024-01-09 MED ORDER — ENOXAPARIN SODIUM 40 MG/0.4ML IJ SOSY
40.0000 mg | PREFILLED_SYRINGE | INTRAMUSCULAR | Status: DC
  Administered 2024-01-10: 40 mg via SUBCUTANEOUS
  Filled 2024-01-09: qty 0.4

## 2024-01-09 MED ORDER — PRASUGREL HCL 10 MG PO TABS
ORAL_TABLET | ORAL | Status: DC | PRN
  Administered 2024-01-09: 60 mg via ORAL

## 2024-01-09 MED ORDER — VERAPAMIL HCL 2.5 MG/ML IV SOLN
INTRAVENOUS | Status: AC
  Filled 2024-01-09: qty 2

## 2024-01-09 MED ORDER — SODIUM CHLORIDE 0.9 % IV SOLN
250.0000 mL | INTRAVENOUS | Status: AC | PRN
Start: 2024-01-09 — End: 2024-01-10

## 2024-01-09 MED ORDER — PRASUGREL HCL 10 MG PO TABS
10.0000 mg | ORAL_TABLET | Freq: Every day | ORAL | Status: DC
  Administered 2024-01-10: 10 mg via ORAL
  Filled 2024-01-09: qty 1

## 2024-01-09 MED ORDER — HEPARIN SODIUM (PORCINE) 1000 UNIT/ML IJ SOLN
INTRAMUSCULAR | Status: DC | PRN
  Administered 2024-01-09 (×2): 5000 [IU] via INTRAVENOUS
  Administered 2024-01-09 (×2): 2000 [IU] via INTRAVENOUS

## 2024-01-09 MED ORDER — LIDOCAINE HCL (PF) 1 % IJ SOLN
INTRAMUSCULAR | Status: DC | PRN
  Administered 2024-01-09: 2 mL

## 2024-01-09 MED ORDER — FENTANYL CITRATE (PF) 100 MCG/2ML IJ SOLN
INTRAMUSCULAR | Status: DC | PRN
  Administered 2024-01-09 (×2): 25 ug via INTRAVENOUS

## 2024-01-09 MED ORDER — VERAPAMIL HCL 2.5 MG/ML IV SOLN
INTRAVENOUS | Status: DC | PRN
  Administered 2024-01-09 (×2): 10 mL via INTRA_ARTERIAL

## 2024-01-09 MED ORDER — NITROGLYCERIN 1 MG/10 ML FOR IR/CATH LAB
INTRA_ARTERIAL | Status: AC
Start: 2024-01-09 — End: 2024-01-09
  Filled 2024-01-09: qty 10

## 2024-01-09 MED ORDER — HYDRALAZINE HCL 20 MG/ML IJ SOLN: 10.0000 mg | INTRAMUSCULAR | Status: AC | PRN

## 2024-01-09 MED ORDER — MIDAZOLAM HCL 2 MG/2ML IJ SOLN
INTRAMUSCULAR | Status: AC
  Filled 2024-01-09: qty 2

## 2024-01-09 MED ORDER — PROMETHAZINE HCL 25 MG PO TABS: 25.0000 mg | ORAL_TABLET | Freq: Four times a day (QID) | ORAL | Status: DC | PRN

## 2024-01-09 MED ORDER — LABETALOL HCL 5 MG/ML IV SOLN: 10.0000 mg | INTRAVENOUS | Status: AC | PRN

## 2024-01-09 MED ORDER — HEPARIN SODIUM (PORCINE) 1000 UNIT/ML IJ SOLN
INTRAMUSCULAR | Status: AC
  Filled 2024-01-09: qty 10

## 2024-01-09 SURGICAL SUPPLY — 13 items
BALLOON SAPPHIRE 2.25X15 (BALLOONS) IMPLANT
BALLOON SAPPHIRE NC24 3.0X15 (BALLOONS) IMPLANT
BALLOON ~~LOC~~ EMERGE MR 2.75X20 (BALLOONS) IMPLANT
CATH 5FR JL3.5 JR4 ANG PIG MP (CATHETERS) IMPLANT
CATH VISTA GUIDE 6FR XBLD 3.5 (CATHETERS) IMPLANT
DEVICE RAD COMP TR BAND LRG (VASCULAR PRODUCTS) IMPLANT
GLIDESHEATH SLEND SS 6F .021 (SHEATH) IMPLANT
GUIDEWIRE INQWIRE 1.5J.035X260 (WIRE) IMPLANT
KIT ENCORE 26 ADVANTAGE (KITS) IMPLANT
PACK CARDIAC CATHETERIZATION (CUSTOM PROCEDURE TRAY) ×1 IMPLANT
STATION PROTECTION PRESSURIZED (MISCELLANEOUS) IMPLANT
STENT ONYX FRONTIER 2.5X38 (Permanent Stent) IMPLANT
WIRE RUNTHROUGH .014X180CM (WIRE) IMPLANT

## 2024-01-09 NOTE — Progress Notes (Signed)
  Progress Note  Patient Name: TENASIA AULL Date of Encounter: 01/09/2024 [facility] Cardiologist: Evalene Lunger, MD   Interval Summary   No chest pain overnight.  Husband at bedside.  No shortness of breath.  Anxious about cardiac catheterization.  Vital Signs Vitals:   01/08/24 2255 01/08/24 2301 01/09/24 0453 01/09/24 0747  BP: (!) 145/74 (!) 145/74 107/75 127/80  Pulse: 74 81 75 75  Resp: 15  17 20   Temp: 98.3 F (36.8 C)  97.8 F (36.6 C) 97.8 F (36.6 C)  TempSrc: Oral  Oral Oral  SpO2: 99%  97%   Weight:   97.4 kg   Height:   5' 5 (1.651 m)     Intake/Output Summary (Last 24 hours) at 01/09/2024 0805 Last data filed at 01/09/2024 0453 Gross per 24 hour  Intake 740 ml  Output 4 ml  Net 736 ml      01/09/2024    4:53 AM 01/07/2024    3:22 PM 01/07/2024    1:45 AM  Last 3 Weights  Weight (lbs) 214 lb 11.7 oz 216 lb 1.6 oz 215 lb  Weight (kg) 97.4 kg 98.022 kg 97.523 kg      Telemetry/ECG  Sinus rhythm without significant arrhythmia- Personally Reviewed  Physical Exam  GEN: No acute distress, pleasant obese woman.   Neck: No JVD Cardiac: RRR, no murmurs, rubs, or gallops.  Right radial pulse 2+ Respiratory: Clear to auscultation bilaterally. GI: Soft, nontender, non-distended  MS: No edema  Assessment & Plan  1.  Non-STEMI: Patient with regional wall motion abnormality noted on echo as well as elevated troponin.  Plans for cardiac catheterization possible PCI today.  Risk factors include hypertension, mixed hyperlipidemia, and rheumatoid arthritis.  Continue aspirin , continue heparin  drip.  Anticipate cardiac catheterization via right radial approach.  Cardiology 2.  Hypertension: Blood pressure readings/vital signs are reviewed and controlled this morning with a blood pressure 127/80.  Treated with metoprolol . 3.  Mixed hyperlipidemia: Statin intolerant.  On ezetimibe .  Plan outpatient lipid clinic referral for PCSK9 inhibitor or inclisiran.   Cholesterol is 217, LDL 134.    For questions or updates, please contact [facility] Please consult [email or web address] for contact info under       Signed, Ozell Fell, MD

## 2024-01-09 NOTE — H&P (View-Only) (Signed)
  Progress Note  Patient Name: Tracey Wiley Date of Encounter: 01/09/2024 [facility] Cardiologist: Evalene Lunger, MD   Interval Summary   No chest pain overnight.  Husband at bedside.  No shortness of breath.  Anxious about cardiac catheterization.  Vital Signs Vitals:   01/08/24 2255 01/08/24 2301 01/09/24 0453 01/09/24 0747  BP: (!) 145/74 (!) 145/74 107/75 127/80  Pulse: 74 81 75 75  Resp: 15  17 20   Temp: 98.3 F (36.8 C)  97.8 F (36.6 C) 97.8 F (36.6 C)  TempSrc: Oral  Oral Oral  SpO2: 99%  97%   Weight:   97.4 kg   Height:   5' 5 (1.651 m)     Intake/Output Summary (Last 24 hours) at 01/09/2024 0805 Last data filed at 01/09/2024 0453 Gross per 24 hour  Intake 740 ml  Output 4 ml  Net 736 ml      01/09/2024    4:53 AM 01/07/2024    3:22 PM 01/07/2024    1:45 AM  Last 3 Weights  Weight (lbs) 214 lb 11.7 oz 216 lb 1.6 oz 215 lb  Weight (kg) 97.4 kg 98.022 kg 97.523 kg      Telemetry/ECG  Sinus rhythm without significant arrhythmia- Personally Reviewed  Physical Exam  GEN: No acute distress, pleasant obese woman.   Neck: No JVD Cardiac: RRR, no murmurs, rubs, or gallops.  Right radial pulse 2+ Respiratory: Clear to auscultation bilaterally. GI: Soft, nontender, non-distended  MS: No edema  Assessment & Plan  1.  Non-STEMI: Patient with regional wall motion abnormality noted on echo as well as elevated troponin.  Plans for cardiac catheterization possible PCI today.  Risk factors include hypertension, mixed hyperlipidemia, and rheumatoid arthritis.  Continue aspirin , continue heparin  drip.  Anticipate cardiac catheterization via right radial approach.  Cardiology 2.  Hypertension: Blood pressure readings/vital signs are reviewed and controlled this morning with a blood pressure 127/80.  Treated with metoprolol . 3.  Mixed hyperlipidemia: Statin intolerant.  On ezetimibe .  Plan outpatient lipid clinic referral for PCSK9 inhibitor or inclisiran.   Cholesterol is 217, LDL 134.    For questions or updates, please contact [facility] Please consult [email or web address] for contact info under       Signed, Ozell Fell, MD

## 2024-01-09 NOTE — Interval H&P Note (Signed)
 History and Physical Interval Note:  01/09/2024 3:05 PM  Tracey Wiley  has presented today for surgery, with the diagnosis of NSTEMI.  The various methods of treatment have been discussed with the patient and family. After consideration of risks, benefits and other options for treatment, the patient has consented to  Procedure(s): LEFT HEART CATH AND CORONARY ANGIOGRAPHY (N/A) as a surgical intervention.  The patient's history has been reviewed, patient examined, no change in status, stable for surgery.  I have reviewed the patient's chart and labs.  Questions were answered to the patient's satisfaction.    Cath Lab Visit (complete for each Cath Lab visit)  Clinical Evaluation Leading to the Procedure:   ACS: Yes.    Non-ACS:  N/A  Kerisha Goughnour
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
## 2024-01-09 NOTE — Progress Notes (Signed)
   Notified by RN that patient had an episode of dizziness, chest discomfort, nausea.  Reportedly, patient had felt like the room was spinning and then her head went back.  Her significant other in the room reported that she stopped responding to him.  He hit the code button, nursing staff rushed to bedside.  Patient was awake when nursing staff arrived.  Reported mild chest discomfort.  EKG obtained and showed sinus rhythm, no acute ischemic changes.   I went to evaluate the patient.  Patient tells me that she had a [place] sandwich for dinner around 615.  After eating she started to feel a bit nauseous.  She threw up.  Started to feel lightheaded and like the room was spinning around her.  She reports that she does not remember what happened next.  Possible that she lost consciousness, but she is not 100% sure.  She developed mild chest discomfort which was brief and resolved on its own.  At the time my evaluation, patient was chest pain-free.  Denies shortness of breath.  Dizziness had resolved.  Continues to be a bit nauseous, but otherwise felt well.  Patient reports that she has had bad reactions to multiple medications in the past.  After surgeries in the past, she has had a similar reaction.  At the time my evaluation, patient alert and oriented.  Sitting upright in the bed in no acute distress.  Heart rate regular, breathing unlabored.  Lungs clear to auscultation.  Able to smile and raise eyebrows symmetrically.  Able to shrug shoulders, squeeze my fingers with equal strength bilaterally.  She denies any unilateral weakness or numbness.  On telemetry, patient maintaining sinus rhythm.  Did become bradycardic with heart rate into the 40s, possible vasovagal event.  Very low suspicion for ACS as EKG shows no ischemic changes and chest pain was very mild/brief.  Currently chest pain-free.  Very low suspicion for neurologic event given normal physical exam, very brief duration of symptoms, lack of focal  neurologic deficits   Continue to monitor on telemetry. Ordered phenergan  for mild nausea. Encouraged PO hydration   Rollo FABIENE Louder, PA-C 01/09/2024 7:21 PM

## 2024-01-09 NOTE — TOC CM/SW Note (Addendum)
 Transition of Care [facility]) - Inpatient Brief Assessment   Patient Details  Name: Tracey Wiley MRN: [id number] Date of Birth: 1956-05-06  Transition of Care [facility]) CM/SW Contact:    Luise JAYSON Pan, LCSWA Phone Number: 01/09/2024, 3:42PM   Clinical Narrative: CSW met patients SO in room as patient was not in room. Per Tracey Wiley (legal name is [name] but goes by Tracey Wiley), patient and himself will be building their own house and moving. Tracey Wiley stated he will provide transportation at time of discharge. Patient has a PCP and uses [facility] listed [facility]. Patient has no prior hx of HH or SNF, per [facility]. Patient has been to [facility] outpatient PT at [facility] on [address] [place], [place] [address].  SDOH social connections resources not needed at this time as patient has family support.   TOC will continue to follow.    Transition of Care Asessment: Insurance and Status: Insurance coverage has been reviewed Patient has primary care physician: Yes Home environment has been reviewed: Home with SO Prior level of function:: Independent Prior/Current Home Services: No current home services Social Drivers of Health Review: SDOH reviewed no interventions necessary Readmission risk has been reviewed: No Transition of care needs: no transition of care needs at this time
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
## 2024-01-10 ENCOUNTER — Other Ambulatory Visit (HOSPITAL_COMMUNITY): Payer: Self-pay

## 2024-01-10 ENCOUNTER — Telehealth (HOSPITAL_COMMUNITY): Payer: Self-pay | Admitting: Pharmacist

## 2024-01-10 ENCOUNTER — Telehealth (HOSPITAL_COMMUNITY): Payer: Self-pay | Admitting: Pharmacy Technician

## 2024-01-10 DIAGNOSIS — I214 Non-ST elevation (NSTEMI) myocardial infarction: Secondary | ICD-10-CM | POA: Diagnosis not present

## 2024-01-10 LAB — BASIC METABOLIC PANEL WITH GFR
Anion gap: 10 (ref 5–15)
BUN: 9 mg/dL (ref 8–23)
CO2: 24 mmol/L (ref 22–32)
Calcium: 8.9 mg/dL (ref 8.9–10.3)
Chloride: 108 mmol/L (ref 98–111)
Creatinine, Ser: 0.7 mg/dL (ref 0.44–1.00)
GFR, Estimated: >60 mL/min (ref >60)
Glucose, Bld: 99 mg/dL (ref 70–99)
Potassium: 3.8 mmol/L (ref 3.5–5.1)
Sodium: 142 mmol/L (ref 135–145)

## 2024-01-10 LAB — CBC
HCT: 36.1 % (ref 36.0–46.0)
Hemoglobin: 11.6 g/dL — ABNORMAL LOW (ref 12.0–15.0)
MCH: 26.9 pg (ref 26.0–34.0)
MCHC: 32.1 g/dL (ref 30.0–36.0)
MCV: 83.8 fL (ref 80.0–100.0)
Platelets: 246 K/uL (ref 150–400)
RBC: 4.31 MIL/uL (ref 3.87–5.11)
RDW: 14.7 % (ref 11.5–15.5)
WBC: 8.9 K/uL (ref 4.0–10.5)
nRBC: 0 % (ref 0.0–0.2)

## 2024-01-10 MED ORDER — VALSARTAN 40 MG PO TABS
40.0000 mg | ORAL_TABLET | Freq: Every day | ORAL | 11 refills | Status: DC
  Filled 2024-01-10: qty 30, 30d supply, fill #0

## 2024-01-10 MED ORDER — METOPROLOL TARTRATE 25 MG PO TABS
25.0000 mg | ORAL_TABLET | Freq: Two times a day (BID) | ORAL | 0 refills | Status: DC
  Filled 2024-01-10: qty 60, 30d supply, fill #0

## 2024-01-10 MED ORDER — PRASUGREL HCL 10 MG PO TABS
10.0000 mg | ORAL_TABLET | Freq: Every day | ORAL | 1 refills | Status: DC
  Filled 2024-01-10: qty 30, 30d supply, fill #0

## 2024-01-10 NOTE — Progress Notes (Signed)
  Progress Note  Patient Name: Tracey Wiley Date of Encounter: 01/10/2024 [facility] Cardiologist: Evalene Lunger, MD   Interval Summary   Patient had a fairly severe vagal reaction last night with vomiting, near syncope, and bradycardia/hypotension.  She has done well since then with normal vital signs and no recurrent symptoms.  She ate breakfast this morning.  Vital Signs Vitals:   01/09/24 1900 01/09/24 2247 01/10/24 0539 01/10/24 0738  BP: 132/71 (!) 140/72 134/77 137/79  Pulse: 82 84 80   Resp: 13 20 19 16   Temp: 97.6 F (36.4 C) 97.9 F (36.6 C) 98.1 F (36.7 C) 97.8 F (36.6 C)  TempSrc: Oral Oral Oral Oral  SpO2: 98% 97% 100% 99%  Weight:      Height:        Intake/Output Summary (Last 24 hours) at 01/10/2024 9177 Last data filed at 01/09/2024 2247 Gross per 24 hour  Intake 980 ml  Output 1 ml  Net 979 ml      01/09/2024    4:53 AM 01/07/2024    3:22 PM 01/07/2024    1:45 AM  Last 3 Weights  Weight (lbs) 214 lb 11.7 oz 216 lb 1.6 oz 215 lb  Weight (kg) 97.4 kg 98.022 kg 97.523 kg      Telemetry/ECG  Sinus rhythm with no significant arrhythmia- Personally Reviewed  Physical Exam  GEN: No acute distress.   Neck: No JVD Cardiac: RRR, no murmurs, rubs, or gallops.  Respiratory: Clear to auscultation bilaterally. GI: Soft, nontender, non-distended  MS: No edema  Assessment & Plan  1.  Non-STEMI: Patient was found to have critical LAD stenosis treated with PCI.  She is started on DAPT with aspirin  and prasugrel .  Noted to have mild nonobstructive residual disease appropriate for medical therapy. 2.  Hypertension: In setting of ACS/CAD and diabetes, will add valsartan  40 mg daily.  Continue metoprolol  25 mg twice daily. 3.  Mixed hyperlipidemia: Patient is statin intolerant.  Continue Zetia  10 mg daily.  Refer to lipid clinic for either a PCSK9 or inclisiran. 4.  Vasovagal reaction: Patient fasted for nearly 24 hours, then underwent cardiac  catheterization and PCI.  I explained to her that this is a common phenomenon after fasting and undergoing a cardiac procedure.  She does not appear to have any residual issues.  Disposition: Stable from a cardiac perspective for discharge.  Medications as outlined above.  Will arrange outpatient cardiology follow-up.  Lipid clinic referral made.  Patient follows with Dr. Gollan in [place].    For questions or updates, please contact [facility] Please consult [email or web address] for contact info under       Signed, Ozell Fell, MD
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
## 2024-01-10 NOTE — Telephone Encounter (Signed)
 PHARMACIST LIPID MONITORING  Tracey Wiley is a 68 y.o. y.o. adult admitted on 01/07/24 with ACS.  Pharmacy has been consulted to optimize lipid-lowering therapy with the indication of secondary prevention for clinical ASCVD.   Recent Labs: Lipid Panel:     Component Value Date/Time   CHOL 217 (H) 01/09/2024 0226   CHOL 217 (H) 09/27/2023 1614   TRIG 214 (H) 01/09/2024 0226   HDL 40 (L) 01/09/2024 0226   HDL 38 (L) 09/27/2023 1614   CHOLHDL 5.4 01/09/2024 0226   VLDL 43 (H) 01/09/2024 0226   LDLCALC 134 (H) 01/09/2024 0226   LDLCALC 137 (H) 09/27/2023 1614   LDLDIRECT 159 (H) 10/13/2022 1237    Hepatic function panel:     Component Value Date/Time   AST 25 01/08/2024 0812   AST 32 03/20/2013 1451   ALT 15 01/08/2024 0812   ALT 26 03/20/2013 1451   ALKPHOS 69 01/08/2024 0812   ALKPHOS 93 03/20/2013 1451   BILITOT 0.5 01/08/2024 0812   BILITOT 0.3 12/04/2021 1413   BILITOT 0.3 03/20/2013 1451   BILIDIR <0.1 12/27/2017 1107   IBILI NOT CALCULATED 12/27/2017 1107    Serum Creatinine  Creat (mg/dL)  Date Value  90/97/7977 0.83   Creatinine, Ser (mg/dL)  Date Value  91/94/7974 0.70   Estimated CrCl of Estimated Creatinine Clearance: 79.9 mL/min (by C-G formula based on SCr of 0.7 mg/dL).  Current therapy and lipid therapy tolerance: Current lipid-lowering therapy: ezetimide  Previous lipid-lowering therapies (if applicable): ezetimide  Documented or reported allergies or intolerances to lipid-lowering therapies (if applicable): statin    Assessment:    Patient is  Statin-intolerant Baseline LDL: 130 Major ASCVD events: Recent ACS in past 12 months  High-Risk Conditions:   Plan:    Statin intensity: Statin intolerance noted. No statin changes due to serious side effects (e.g. myalgias with at least 2 different statins). Refer to lipid clinic:   Yes  Olam Chalk Pharm.D. CPP, BCPS Clinical Pharmacist [phone] 01/10/2024 9:46 AM

## 2024-01-10 NOTE — Telephone Encounter (Signed)
 Patient Product/process development scientist completed.    The patient is insured through [employer]. Patient has Medicare and is not eligible for a copay card, but may be able to apply for patient assistance or Medicare RX Payment Plan (Patient Must reach out to their plan, if eligible for payment plan), if available.    Ran test claim for prasugrel  (Effient ) 10 mg and the current 30 day co-pay is $9.94.   This test claim was processed through [facility]- copay amounts may vary at other pharmacies due to pharmacy/plan contracts, or as the patient moves through the different stages of their insurance plan.     Tracey Wiley, CPHT Pharmacy Technician III Certified Patient Advocate [facility] Patient Advocate Team Direct Number: [phone]  Fax: [phone]

## 2024-01-10 NOTE — Progress Notes (Signed)
 CARDIAC REHAB PHASE I   PRE:  Rate/Rhythm: 73 NSR  BP:  Sitting: 137/79      SpO2: 98 RA  MODE:  Ambulation: 50 ft    POST:  Rate/Rhythm: 100 ST  BP:  Sitting: 138/99      SpO2: 98 RA  Pt ambulated with CGA, pt c/o BL thigh pain which is chronic. Pt currently receiving PT for multiple conditions.  Pt was educated on NSTEMI, stent card, stent location, Antiplatelet and ASA use, wt restrictions, no baths/daily wash-ups, s/s of infection, ex guidelines, s/s to stop exercising, NTG use and calling 911, heart healthy diet, risk factors and CRPII. Pt received MI book and materials on exercise, diet, and CRPII. Will refer to [facility]  MS, ACSM-CEP 9:59 AM 01/10/2024    Service time is from 0820 to 0916.

## 2024-01-11 ENCOUNTER — Telehealth: Payer: Self-pay

## 2024-01-11 ENCOUNTER — Ambulatory Visit

## 2024-01-11 LAB — LIPOPROTEIN A (LPA): Lipoprotein (a): 62.8 nmol/L — ABNORMAL HIGH (ref <75.0)

## 2024-01-11 NOTE — Telephone Encounter (Signed)
 Error

## 2024-01-11 NOTE — Telephone Encounter (Signed)
 FYI

## 2024-01-11 NOTE — Telephone Encounter (Signed)
 Copied from CRM #[id number]. Topic: Appointments - Appointment Scheduling >> Jan 11, 2024  2:11 PM Pinkey ORN wrote: Patient/patient representative is calling to schedule an appointment. Refer to attachments for appointment information. >> Jan 11, 2024  2:14 PM Pinkey ORN wrote: Patient was just released from the hospital today and is needing to schedule a hospital follow up.   Patient was discharged from hospital on 01/10/2024.  Dr. Verneita Kettering does not have an appointment available in the two weeks following patient's discharge date.  I spoke with patient and scheduled a hospital follow-up appointment for her with Dr. Hans Baptist.  I let patient know that I will send Dr. Verneita Kettering a note letting her know about this appointment.

## 2024-01-12 ENCOUNTER — Ambulatory Visit (INDEPENDENT_AMBULATORY_CARE_PROVIDER_SITE_OTHER): Admitting: Clinical

## 2024-01-12 DIAGNOSIS — F4322 Adjustment disorder with anxiety: Secondary | ICD-10-CM

## 2024-01-12 NOTE — Progress Notes (Signed)
 Time: 12:00 pm-12:58 pm CPT Code: 09162E-04 Diagnosis: F43.22  Tracey Wiley was seen remotely using secure video conferencing. She and the therapist were in their respective homes at time of appointment. Client is aware of risks of telehealth and consented to a virtual visit. Tracey Wiley shared that she had suffered a heart attack the preceding Saturday, and had only gotten home from the hospital the day before. Session focused on processing this experience. Therapist encouraged Tracey Wiley to prioritize self care in the coming weeks. She is scheduled to be seen again in two weeks.  Treatment Plan Client Abilities/Strengths Tracey Wiley shared that she has participated in therapy in the past and has been very honest in session.  Client Treatment Preferences:  Tracey Wiley prefers in person appointments. She prefers Tuesday and Thursday afternoon appointments. Client Statement of Needs  Tracey Wiley is seeking validation of her emotional responses and overall self-concept. Treatment Level  Weekly   Symptoms Tearfulness, irritability Problems Addressed  Goals Tracey Wiley will reduce symptoms of depression and improve overall mood 1. Tracey Wiley will increase comfort in social situations Objective  Target Date: 01/08/2024 Frequency: Weekly  Progress: 0 Modality: Individual therapy  Objective  Target Date: 01/07/2024 Frequency: Weekly  Progress: 0 Modality: individual therapy  Related Interventions  Tracey Wiley will have opportunities to process her experiences in session  Therapist will point out maladaptive thought and behavior patterns using CBT strategies. Therapist will incorporate behavior activation as appropriate. Therapist will incorporate gradual exposure therapy as appropriate. Therapist will provide referrals for additional resource as appropriate.  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

## 2024-01-13 NOTE — Telephone Encounter (Signed)
 FYI- Appointment for EGD has been canceled due to recent MI. Patient requested to schedule follow-up with Dr Avram before r/s EGD/Colon. Appt for f/u schedule for 03/29/24 at 2:10 pm with Dr Avram.

## 2024-01-13 NOTE — Telephone Encounter (Signed)
 Patient sates she has a heart attack on August 1st and was in the hospital for about 6 days.

## 2024-01-13 NOTE — Telephone Encounter (Signed)
 Inbound call from patient requesting f/u call to discuss upcoming procedure. Patient states she was just released from the hospital and feels she will not able to come in for procedure. Please advise.   Thank you

## 2024-01-16 ENCOUNTER — Ambulatory Visit

## 2024-01-18 ENCOUNTER — Ambulatory Visit

## 2024-01-18 ENCOUNTER — Encounter: Payer: Self-pay | Admitting: Internal Medicine

## 2024-01-18 ENCOUNTER — Ambulatory Visit: Admitting: Internal Medicine

## 2024-01-18 VITALS — BP 126/72 | HR 67 | Temp 98.2°F | Ht 65.0 in | Wt 211.2 lb

## 2024-01-18 DIAGNOSIS — I214 Non-ST elevation (NSTEMI) myocardial infarction: Secondary | ICD-10-CM | POA: Diagnosis not present

## 2024-01-18 DIAGNOSIS — E782 Mixed hyperlipidemia: Secondary | ICD-10-CM

## 2024-01-18 NOTE — Patient Instructions (Addendum)
-   It was a pleasure meeting you today -I am glad that you are feeling better after your hospital stay -Please follow-up with your cardiologist next week -You are currently on Zetia  for your high cholesterol.  I suspect that they will consider starting you on Repatha .  Please discuss this with them at your follow-up visit -You do have prediabetes.  Please continue with a healthy diet and exercise as advised by your cardiologist -Your blood pressure was elevated when you came today but I suspect this is likely secondary to stress.  Repeat blood pressure was within normal limits.  Continue with your metoprolol  and valsartan  for now -Please contact us  with any questions or concerns or if you need any refills

## 2024-01-18 NOTE — Progress Notes (Signed)
 Acute Office Visit  Subjective:     Patient ID: Tracey Wiley, female    DOB: 04/13/1956, 68 y.o.   MRN: [id number]  Chief Complaint  Patient presents with   Hospitalization Follow-up    HPI Patient is in today for hospital follow-up.  Patient states that she was recently admitted to the hospital from August 2 to August 5 for an acute MI.  During her hospitalization she had a left heart cath which showed severe greater than 95% stenosis in the proximal to mid LAD status post successful PCI and stent placement.  Patient stated that her chest pain is now resolved.  She does get occasional twinges when she gets emotional but denies any significant pain.  She was started on metoprolol  and valsartan  as well as dual antiplatelet therapy with prasugrel  and aspirin .  During the hospitalization she also had a vasovagal reaction after her cath but no further episodes since then.  Patient also has a history of hyperlipidemia and she has been on Zetia  due to statin intolerance.  She will follow-up with cardiology to see if she would benefit from a PCSK9 inhibitor.  Medications were reviewed in detail.  Patient was able to obtain her new medications and is compliant with them.  Review of Systems  Constitutional: Negative.   HENT: Negative.    Respiratory: Negative.    Cardiovascular: Negative.   Gastrointestinal: Negative.   Musculoskeletal: Negative.   Neurological: Negative.   Psychiatric/Behavioral: Negative.          Objective:    BP 126/72   Pulse 67   Temp 98.2 F (36.8 C)   Ht 5' 5 (1.651 m)   Wt 211 lb 3.2 oz (95.8 kg)   SpO2 98%   BMI 35.15 kg/m    Physical Exam Constitutional:      Appearance: Normal appearance.  HENT:     Head: Normocephalic and atraumatic.  Cardiovascular:     Rate and Rhythm: Normal rate and regular rhythm.     Heart sounds: Normal heart sounds.  Pulmonary:     Effort: Pulmonary effort is normal. No respiratory distress.     Breath sounds:  Normal breath sounds. No wheezing, rhonchi or rales.  Abdominal:     General: Bowel sounds are normal. There is no distension.     Palpations: Abdomen is soft.     Tenderness: There is no abdominal tenderness. There is no guarding or rebound.  Musculoskeletal:        General: No swelling or tenderness.     Right lower leg: No edema.     Left lower leg: No edema.  Neurological:     Mental Status: She is alert and oriented to person, place, and time.  Psychiatric:        Mood and Affect: Mood normal.        Behavior: Behavior normal.     No results found for any visits on 01/18/24.      Assessment & Plan:   Problem List Items Addressed This Visit       Cardiovascular and Mediastinum   NSTEMI (non-ST elevated myocardial infarction) (HCC) - Primary   - Patient was admitted to the hospital with chest pain and was found to have an acute MI with a 95% occlusion of her proximal to mid LAD status post stent placement -Will continue with metoprolol , valsartan  as well as dual antiplatelet therapy with aspirin  and prasugrel  (needs 31-month course per discharge summary) -Will continue with Zetia  for  her hyperlipidemia.  However, patient would likely benefit from PCSK9 inhibitor.  She will discuss this at her cardiology appointment next week. -Continue to follow-up with cardiology -No further workup at this time        Other   Hyperlipidemia   - This problem is chronic and stable -She is currently on Zetia  and was noted to have an LDL level greater than 130 in the hospital with an elevated lipoprotein a -Will defer to cardiology for further evaluation and treatment but suspect that she would likely benefit from a PCSK9 inhibitor given her recent MI and intolerance to statins -No further workup at this time       No orders of the defined types were placed in this encounter.   No follow-ups on file.  Chevez Sambrano, MD

## 2024-01-18 NOTE — Assessment & Plan Note (Signed)
-   This problem is chronic and stable -She is currently on Zetia  and was noted to have an LDL level greater than 130 in the hospital with an elevated lipoprotein a -Will defer to cardiology for further evaluation and treatment but suspect that she would likely benefit from a PCSK9 inhibitor given her recent MI and intolerance to statins -No further workup at this time

## 2024-01-18 NOTE — Assessment & Plan Note (Signed)
-   Patient was admitted to the hospital with chest pain and was found to have an acute MI with a 95% occlusion of her proximal to mid LAD status post stent placement -Will continue with metoprolol , valsartan  as well as dual antiplatelet therapy with aspirin  and prasugrel  (needs 43-month course per discharge summary) -Will continue with Zetia  for her hyperlipidemia.  However, patient would likely benefit from PCSK9 inhibitor.  She will discuss this at her cardiology appointment next week. -Continue to follow-up with cardiology -No further workup at this time

## 2024-01-20 ENCOUNTER — Encounter: Admitting: Internal Medicine

## 2024-01-23 ENCOUNTER — Ambulatory Visit
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
## 2024-01-25 ENCOUNTER — Ambulatory Visit

## 2024-01-26 ENCOUNTER — Encounter: Payer: Self-pay | Admitting: Cardiology

## 2024-01-26 ENCOUNTER — Ambulatory Visit (INDEPENDENT_AMBULATORY_CARE_PROVIDER_SITE_OTHER): Admitting: Clinical

## 2024-01-26 ENCOUNTER — Ambulatory Visit: Attending: Cardiology | Admitting: Cardiology

## 2024-01-26 VITALS — BP 110/58 | HR 61 | Ht 65.0 in | Wt 213.6 lb

## 2024-01-26 DIAGNOSIS — I10 Essential (primary) hypertension: Secondary | ICD-10-CM

## 2024-01-26 DIAGNOSIS — I2510 Atherosclerotic heart disease of native coronary artery without angina pectoris: Secondary | ICD-10-CM | POA: Diagnosis not present

## 2024-01-26 DIAGNOSIS — M353 Polymyalgia rheumatica: Secondary | ICD-10-CM

## 2024-01-26 DIAGNOSIS — F4322 Adjustment disorder with anxiety: Secondary | ICD-10-CM

## 2024-01-26 DIAGNOSIS — E782 Mixed hyperlipidemia: Secondary | ICD-10-CM

## 2024-01-26 DIAGNOSIS — M069 Rheumatoid arthritis, unspecified: Secondary | ICD-10-CM

## 2024-01-26 DIAGNOSIS — I471 Supraventricular tachycardia, unspecified: Secondary | ICD-10-CM | POA: Diagnosis not present

## 2024-01-26 DIAGNOSIS — Z79899 Other long term (current) drug therapy: Secondary | ICD-10-CM

## 2024-01-26 DIAGNOSIS — E669 Obesity, unspecified: Secondary | ICD-10-CM

## 2024-01-26 MED ORDER — EZETIMIBE 10 MG PO TABS: 10.0000 mg | ORAL_TABLET | Freq: Every day | ORAL | 3 refills | Status: AC

## 2024-01-26 NOTE — Patient Instructions (Signed)
 Medication Instructions:  Your physician recommends that you continue on your current medications as directed. Please refer to the Current Medication list given to you today.   *If you need a refill on your cardiac medications before your next appointment, please call your pharmacy*  Lab Work: Your provider would like for you to have following labs drawn today BMP, CBC, Ha1c, Uric Acid.   If you have labs (blood work) drawn today and your tests are completely normal, you will receive your results only by: MyChart Message (if you have MyChart) OR A paper copy in the mail If you have any lab test that is abnormal or we need to change your treatment, we will call you to review the results.  Testing/Procedures: No test ordered today   Follow-Up: At [facility], you and your health needs are our priority.  As part of our continuing mission to provide you with exceptional heart care, our providers are all part of one team.  This team includes your primary Cardiologist (physician) and Advanced Practice Providers or APPs (Physician Assistants and Nurse Practitioners) who all work together to provide you with the care you need, when you need it.  Your next appointment:   2 month(s)  Provider:   You may see Timothy Gollan, MD or one of the following Advanced Practice Providers on your designated Care Team:   Lonni Meager, NP Lesley Maffucci, PA-C Bernardino Bring, PA-C Cadence Bud, PA-C Tylene Lunch, NP Barnie Hila, NP

## 2024-01-26 NOTE — Progress Notes (Addendum)
 Time: 12:00 pm-12:58 pm CPT Code: 09162E-04 Diagnosis: F43.22  Tracey Wiley was seen remotely using secure video conferencing. She and the therapist were in their respective homes at time of appointment. Client is aware of risks of telehealth and consented to a virtual visit. Session focused on Tracey Wiley's continued recovery from her recent heart attack, as well as changes she has noticed in her relationships following the heart attack. Therapist offered an opportunity to process, suggesting alternate perspectives and suggesting communication strategies as appropriate. She is scheduled to be seen again in one week.  Treatment Plan Client Abilities/Strengths Tracey Wiley shared that she has participated in therapy in the past and has been very honest in session.  Client Treatment Preferences:  Tracey Wiley prefers in person appointments. She prefers Tuesday and Thursday afternoon appointments. Client Statement of Needs  Tracey Wiley is seeking validation of her emotional responses and overall self-concept. Treatment Level  Weekly   Symptoms Tearfulness, irritability Problems Addressed  Goals Tracey Wiley will reduce symptoms of depression and improve overall mood 1. Tracey Wiley will increase comfort in social situations Objective  Target Date: 01/07/2025 Frequency: Weekly  Progress: 0 Modality: Individual therapy  Objective  Target Date: 01/06/2025 Frequency: Weekly  Progress: 0 Modality: individual therapy  Related Interventions  Tracey Wiley will have opportunities to process her experiences in session  Therapist will point out maladaptive thought and behavior patterns using CBT strategies. Therapist will incorporate behavior activation as appropriate. Therapist will incorporate gradual exposure therapy as appropriate. Therapist will provide referrals for additional resource as appropriate.  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

## 2024-01-27 ENCOUNTER — Encounter: Attending: Cardiovascular Disease | Admitting: *Deleted

## 2024-01-27 DIAGNOSIS — I214 Non-ST elevation (NSTEMI) myocardial infarction: Secondary | ICD-10-CM | POA: Insufficient documentation

## 2024-01-27 DIAGNOSIS — Z955 Presence of coronary angioplasty implant and graft: Secondary | ICD-10-CM | POA: Insufficient documentation

## 2024-01-27 LAB — HEMOGLOBIN A1C
Est. average glucose Bld gHb Est-mCnc: 123 mg/dL
Hgb A1c MFr Bld: 5.9 % — ABNORMAL HIGH (ref 4.8–5.6)

## 2024-01-27 LAB — CBC
Hematocrit: 38.1 % (ref 34.0–46.6)
Hemoglobin: 12 g/dL (ref 11.1–15.9)
MCH: 27.1 pg (ref 26.6–33.0)
MCHC: 31.5 g/dL (ref 31.5–35.7)
MCV: 86 fL (ref 79–97)
Platelets: 279 x10E3/uL (ref 150–450)
RBC: 4.43 x10E6/uL (ref 3.77–5.28)
RDW: 14.3 % (ref 11.7–15.4)
WBC: 7.6 x10E3/uL (ref 3.4–10.8)

## 2024-01-27 LAB — BASIC METABOLIC PANEL WITH GFR
BUN/Creatinine Ratio: 19 (ref 12–28)
BUN: 15 mg/dL (ref 8–27)
CO2: 23 mmol/L (ref 20–29)
Calcium: 9 mg/dL (ref 8.7–10.3)
Chloride: 104 mmol/L (ref 96–106)
Creatinine, Ser: 0.79 mg/dL (ref 0.57–1.00)
Glucose: 96 mg/dL (ref 70–99)
Potassium: 4.2 mmol/L (ref 3.5–5.2)
Sodium: 141 mmol/L (ref 134–144)
eGFR: 82 mL/min/1.73 (ref >59)

## 2024-01-27 LAB — URIC ACID: Uric Acid: 5.6 mg/dL (ref 3.0–7.2)

## 2024-01-27 MED ORDER — PRASUGREL HCL 10 MG PO TABS: 10.0000 mg | ORAL_TABLET | Freq: Every day | ORAL | 1 refills | Status: AC

## 2024-01-27 MED ORDER — METOPROLOL TARTRATE 25 MG PO TABS: 25.0000 mg | ORAL_TABLET | Freq: Two times a day (BID) | ORAL | 11 refills | Status: AC

## 2024-01-27 MED ORDER — VALSARTAN 40 MG PO TABS: 40.0000 mg | ORAL_TABLET | Freq: Every day | ORAL | 11 refills | Status: DC

## 2024-01-27 NOTE — Progress Notes (Signed)
 Initial phone call completed. Diagnosis can be found in [facility] 8/2. EP Orientation scheduled for Tuesday 8/26 at 1:30.

## 2024-01-30 ENCOUNTER — Ambulatory Visit

## 2024-01-30 ENCOUNTER — Telehealth: Payer: Self-pay

## 2024-01-30 ENCOUNTER — Ambulatory Visit: Attending: Cardiology | Admitting: Pharmacist

## 2024-01-30 ENCOUNTER — Other Ambulatory Visit (HOSPITAL_COMMUNITY): Payer: Self-pay

## 2024-01-30 DIAGNOSIS — E782 Mixed hyperlipidemia: Secondary | ICD-10-CM

## 2024-01-30 NOTE — Telephone Encounter (Signed)
-----   Message from Tracey Wiley sent at 01/30/2024  1:07 PM EDT ----- Please do PA for Repatha

## 2024-01-30 NOTE — Assessment & Plan Note (Signed)
 Assessment: LDL-C is above goal of less than 70 due to history of non-STEMI Patient also with a strong family history of both hyperlipidemia and ASCVD Intolerant to simvastatin  40 mg daily and rosuvastatin  5 to 10 mg Discussed options including low intensity statin versus PCSK9 inhibitor  Plan: Prior authorization for Repatha  Repeat labs in 3 months-she will get them at [facility]

## 2024-01-30 NOTE — Patient Instructions (Addendum)
 I will submit a prior authorization for Repatha . I will call you once I hear back. Please call me at [phone] with any questions.   Repatha  is a cholesterol medication that improved your body's ability to get rid of bad cholesterol known as LDL. It can lower your LDL up to 60%! It is an injection that is given under the skin every 2 weeks. The medication often requires a prior authorization from your insurance company. We will take care of submitting all the necessary information to your insurance company to get it approved. The most common side effects of Repatha  include runny nose, symptoms of the common cold, rarely flu or flu-like symptoms, back/muscle pain in about 3-4% of the patients, and redness, pain, or bruising at the injection site. Tell your healthcare provider if you have any side effect that bothers you or that does not go away.   Fasting labs in 3 months at [facility]
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
## 2024-01-30 NOTE — Telephone Encounter (Signed)
 Pharmacy Patient Advocate Encounter   Received notification from Physician's Office that prior authorization for REPATHA  is required/requested.   Insurance verification completed.   The patient is insured through [facility] .   Per test claim: PA required; PA submitted to above mentioned insurance via Latent Key/confirmation #/EOC [id number] Status is pending

## 2024-01-31 ENCOUNTER — Other Ambulatory Visit (HOSPITAL_COMMUNITY): Payer: Self-pay

## 2024-01-31 ENCOUNTER — Ambulatory Visit: Payer: Self-pay | Admitting: Cardiology

## 2024-01-31 ENCOUNTER — Encounter

## 2024-01-31 VITALS — Ht 64.8 in | Wt 215.6 lb

## 2024-01-31 DIAGNOSIS — Z955 Presence of coronary angioplasty implant and graft: Secondary | ICD-10-CM

## 2024-01-31 DIAGNOSIS — I214 Non-ST elevation (NSTEMI) myocardial infarction: Secondary | ICD-10-CM | POA: Diagnosis present

## 2024-01-31 MED ORDER — REPATHA SURECLICK 140 MG/ML ~~LOC~~ SOAJ: 1.0000 mL | SUBCUTANEOUS | 11 refills | Status: DC

## 2024-01-31 NOTE — Progress Notes (Signed)
 Please forward results to her PCP.  Hemoglobin A1c shows slight improvement 5.9.  Kidney function and electrolytes have remained stable.  Hemoglobin is stable without concerns for anemia.  WBCs are normal without elevation concerning for infection.  Uric acid is also within normal limits where she typically runs high.  Recommend she continue with her current medication regimen without changes at this time.  Recommend she also continues with her appointment with the lipid clinic.

## 2024-01-31 NOTE — Telephone Encounter (Signed)
 Pt returning call

## 2024-01-31 NOTE — Telephone Encounter (Signed)
 Pharmacy Patient Advocate Encounter  Received notification from [employer] that Prior Authorization for REPATHA  has been APPROVED from 01/30/24 to 08/01/24. Ran test claim, Copay is $22. This test claim was processed through [facility]- copay amounts may vary at other pharmacies due to pharmacy/plan contracts, or as the patient moves through the different stages of their insurance plan.

## 2024-01-31 NOTE — Progress Notes (Signed)
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 time.   Medication Safety: - Group verbal and visual instruction to review commonly prescribed medications for heart and lung disease. Reviews the medication, class of the drug, and side effects. Includes the steps to properly store meds and maintain the prescription regimen. Written material provided at class time. Flowsheet Row Cardiac Rehab from 01/31/2024 in [facility] Cardiac and Pulmonary Rehab  Education need identified 01/31/24    Intimacy: - Group verbal instruction through game format to discuss how heart and lung disease can affect sexual intimacy. Written material provided at class time.   Know Your Numbers and Heart Failure: - Group verbal and visual instruction to discuss disease risk factors for cardiac and pulmonary disease and treatment options.  Reviews associated critical values for Overweight/Obesity, Hypertension, Cholesterol, and Diabetes.  Discusses  basics of heart failure: signs/symptoms and treatments.  Introduces Heart Failure Zone chart for action plan for heart failure. Written material provided at class time.   Infection Prevention: - Provides verbal and written material to individual with discussion of infection control including proper hand washing and proper equipment cleaning during exercise session. Flowsheet Row Cardiac Rehab from 01/31/2024 in [facility] Cardiac and Pulmonary Rehab  Date 01/31/24  Educator [facility]  Instruction Review Code 1- Verbalizes Understanding    Falls Prevention: - Provides verbal and written material to individual with discussion of falls prevention and safety. Flowsheet Row Cardiac Rehab from 01/31/2024 in [facility] Cardiac and Pulmonary Rehab  Date 01/31/24  Educator  [facility]  Instruction Review Code 1- Verbalizes Understanding    Other: -Provides group and verbal instruction on various topics (see comments)   Knowledge Questionnaire Score:  Knowledge Questionnaire Score - 01/31/24 1515       Knowledge Questionnaire Score   Pre Score 22/26          Core Components/Risk Factors/Patient Goals at Admission:  Personal Goals and Risk Factors at Admission - 01/27/24 1526       Core Components/Risk Factors/Patient Goals on Admission   Hypertension Yes    Intervention Provide education on lifestyle modifcations including regular physical activity/exercise, weight management, moderate sodium restriction and increased consumption of fresh fruit, vegetables, and low fat dairy, alcohol moderation, and smoking cessation.;Monitor prescription use compliance.    Expected Outcomes Short Term: Continued assessment and intervention until BP is < 140/73mm HG in hypertensive participants. < 130/59mm HG in hypertensive participants with diabetes, heart failure or chronic kidney disease.;Long Term: Maintenance of blood pressure at goal levels.    Lipids Yes    Intervention Provide education and support for participant on  nutrition & aerobic/resistive exercise along with prescribed medications to achieve LDL 70mg , HDL >40mg .    Expected Outcomes Short Term: Participant states understanding of desired cholesterol values and is compliant with medications prescribed. Participant is following exercise prescription and nutrition guidelines.;Long Term: Cholesterol controlled with medications as prescribed, with individualized exercise RX and with personalized nutrition plan. Value goals: LDL < 70mg , HDL > 40 mg.          Education:Diabetes - Individual verbal and written instruction to review signs/symptoms of diabetes, desired ranges of glucose level fasting, after meals and with exercise. Acknowledge that pre and post exercise glucose checks will be done for 3 sessions at entry of program.   Core Components/Risk Factors/Patient Goals Review:    Core Components/Risk Factors/Patient Goals at Discharge (Final Review):    ITP Comments:  ITP Comments     Row Name 01/27/24 1541 01/31/24 1509         ITP Comments Initial phone call completed. Diagnosis can be found in [facility] 8/2. EP Orientation scheduled for Tuesday 8/26 at 1:30. Completed and gym orientation for cardiac rehab. Initial ITP created and sent for review to Dr. Oneil Pinal, Medical Director.         Comments: Initial ITP

## 2024-01-31 NOTE — Patient Instructions (Signed)
 Patient Instructions  Patient Details  Name: Tracey Wiley MRN: [id number] Date of Birth: 03-24-1956 Referring Provider:  Perla Evalene PARAS, MD  Below are your personal goals for exercise, nutrition, and risk factors. Our goal is to help you stay on track towards obtaining and maintaining these goals. We will be discussing your progress on these goals with you throughout the program.  Initial Exercise Prescription:  Initial Exercise Prescription - 01/31/24 1500       Date of Initial Exercise RX and Referring Provider   Date 01/31/24    Referring Provider Dr. Timothy Gollan      Oxygen   Maintain Oxygen Saturation 88% or higher      Recumbant Bike   Level 3    RPM 25    Watts 50    Minutes 15    METs 2.2      NuStep   Level 2    SPM 80    Minutes 15    METs 2.2      T5 Nustep   Level 2    SPM 80    Minutes 15    METs 2.2      Track   Laps 21    Minutes 15    METs 2.2      Prescription Details   Duration Progress to 30 minutes of continuous aerobic without signs/symptoms of physical distress      Intensity   THRR 40-80% of Max Heartrate 99-135    Ratings of Perceived Exertion 11-13    Perceived Dyspnea 0-4      Progression   Progression Continue to progress workloads to maintain intensity without signs/symptoms of physical distress.      Resistance Training   Training Prescription Yes    Weight 4lb    Reps 10-15          Exercise Goals: Frequency: Be able to perform aerobic exercise two to three times per week in program working toward 2-5 days per week of home exercise.  Intensity: Work with a perceived exertion of 11 (fairly light) - 15 (hard) while following your exercise prescription.  We will make changes to your prescription with you as you progress through the program.   Duration: Be able to do 30 to 45 minutes of continuous aerobic exercise in addition to a 5 minute warm-up and a 5 minute cool-down routine.   Nutrition Goals: Your personal  nutrition goals will be established when you do your nutrition analysis with the dietician.  The following are general nutrition guidelines to follow: Cholesterol < 200mg /day Sodium < 1500mg /day Fiber: Women over 50 yrs - 21 grams per day  Personal Goals:  Personal Goals and Risk Factors at Admission - 01/27/24 1526       Core Components/Risk Factors/Patient Goals on Admission   Hypertension Yes    Intervention Provide education on lifestyle modifcations including regular physical activity/exercise, weight management, moderate sodium restriction and increased consumption of fresh fruit, vegetables, and low fat dairy, alcohol moderation, and smoking cessation.;Monitor prescription use compliance.    Expected Outcomes Short Term: Continued assessment and intervention until BP is < 140/31mm HG in hypertensive participants. < 130/38mm HG in hypertensive participants with diabetes, heart failure or chronic kidney disease.;Long Term: Maintenance of blood pressure at goal levels.    Lipids Yes    Intervention Provide education and support for participant on nutrition & aerobic/resistive exercise along with prescribed medications to achieve LDL 70mg , HDL >40mg .    Expected Outcomes Short Term:  Participant states understanding of desired cholesterol values and is compliant with medications prescribed. Participant is following exercise prescription and nutrition guidelines.;Long Term: Cholesterol controlled with medications as prescribed, with individualized exercise RX and with personalized nutrition plan. Value goals: LDL < 70mg , HDL > 40 mg.          Exercise Goals and Review:  Exercise Goals     Row Name 01/31/24 1513             Exercise Goals   Increase Physical Activity Yes       Intervention Provide advice, education, support and counseling about physical activity/exercise needs.;Develop an individualized exercise prescription for aerobic and resistive training based on initial  evaluation findings, risk stratification, comorbidities and participant's personal goals.       Expected Outcomes Short Term: Attend rehab on a regular basis to increase amount of physical activity.;Long Term: Exercising regularly at least 3-5 days a week.;Long Term: Add in home exercise to make exercise part of routine and to increase amount of physical activity.       Increase Strength and Stamina Yes       Intervention Develop an individualized exercise prescription for aerobic and resistive training based on initial evaluation findings, risk stratification, comorbidities and participant's personal goals.;Provide advice, education, support and counseling about physical activity/exercise needs.       Expected Outcomes Short Term: Increase workloads from initial exercise prescription for resistance, speed, and METs.;Short Term: Perform resistance training exercises routinely during rehab and add in resistance training at home;Long Term: Improve cardiorespiratory fitness, muscular endurance and strength as measured by increased METs and functional capacity ( )       Able to understand and use rate of perceived exertion (RPE) scale Yes       Intervention Provide education and explanation on how to use RPE scale       Expected Outcomes Short Term: Able to use RPE daily in rehab to express subjective intensity level;Long Term:  Able to use RPE to guide intensity level when exercising independently       Able to understand and use Dyspnea scale Yes       Intervention Provide education and explanation on how to use Dyspnea scale       Expected Outcomes Short Term: Able to use Dyspnea scale daily in rehab to express subjective sense of shortness of breath during exertion;Long Term: Able to use Dyspnea scale to guide intensity level when exercising independently       Knowledge and understanding of Target Heart Rate Range (THRR) Yes       Intervention Provide education and explanation of THRR including how  the numbers were predicted and where they are located for reference       Expected Outcomes Short Term: Able to use daily as guideline for intensity in rehab;Short Term: Able to state/look up THRR;Long Term: Able to use THRR to govern intensity when exercising independently       Able to check pulse independently Yes       Intervention Provide education and demonstration on how to check pulse in carotid and radial arteries.;Review the importance of being able to check your own pulse for safety during independent exercise       Expected Outcomes Short Term: Able to explain why pulse checking is important during independent exercise;Long Term: Able to check pulse independently and accurately       Understanding of Exercise Prescription Yes       Intervention Provide education, explanation,  and written materials on patient's individual exercise prescription       Expected Outcomes Short Term: Able to explain program exercise prescription;Long Term: Able to explain home exercise prescription to exercise independently          Copy of goals given to participant.

## 2024-01-31 NOTE — Addendum Note (Signed)
 Addended by: Everette Dimauro D on: 01/31/2024 04:37 PM   Modules accepted: Orders

## 2024-02-01 ENCOUNTER — Ambulatory Visit

## 2024-02-01 DIAGNOSIS — Z955 Presence of coronary angioplasty implant and graft: Secondary | ICD-10-CM

## 2024-02-01 DIAGNOSIS — I214 Non-ST elevation (NSTEMI) myocardial infarction: Secondary | ICD-10-CM

## 2024-02-01 NOTE — Progress Notes (Signed)
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 conditions including Myocardial Infarction, Angina, Valve Disease, and Cardiac Arrhythmias. Written material provided at class time.   Medication Safety: - Group verbal and visual instruction to review commonly prescribed medications for heart and lung disease. Reviews the medication, class of the drug, and side effects. Includes the steps to properly store meds and maintain the prescription regimen. Written material provided at class time. Flowsheet Row Cardiac Rehab from 01/31/2024 in [facility] Cardiac and Pulmonary Rehab  Education need identified 01/31/24    Intimacy: - Group verbal instruction through game format to discuss how heart and lung disease can affect sexual intimacy. Written material provided at class time.   Know Your Numbers and Heart Failure: - Group verbal and visual instruction to discuss disease risk factors for cardiac and pulmonary disease and  treatment options.  Reviews associated critical values for Overweight/Obesity, Hypertension, Cholesterol, and Diabetes.  Discusses basics of heart failure: signs/symptoms and treatments.  Introduces Heart Failure Zone chart for action plan for heart failure. Written material provided at class time.   Infection Prevention: - Provides verbal and written material to individual with discussion of infection control including proper hand washing and proper equipment cleaning during exercise session. Flowsheet Row Cardiac Rehab from 01/31/2024 in [facility] Cardiac and Pulmonary Rehab  Date 01/31/24  Educator [facility]  Instruction Review Code 1- Verbalizes Understanding    Falls Prevention: - Provides verbal and written material to individual with discussion  of falls prevention and safety. Flowsheet Row Cardiac Rehab from 01/31/2024 in [facility] Cardiac and Pulmonary Rehab  Date 01/31/24  Educator [facility]  Instruction Review Code 1- Verbalizes Understanding    Other: -Provides group and verbal instruction on various topics (see comments)   Knowledge Questionnaire Score:  Knowledge Questionnaire Score - 01/31/24 1515       Knowledge Questionnaire Score   Pre Score 22/26          Core Components/Risk Factors/Patient Goals at Admission:  Personal Goals and Risk Factors at Admission - 01/27/24 1526       Core Components/Risk Factors/Patient Goals on Admission   Hypertension Yes    Intervention Provide education on lifestyle modifcations including regular physical activity/exercise, weight management, moderate sodium restriction and increased consumption of fresh fruit, vegetables, and low fat dairy, alcohol moderation, and smoking cessation.;Monitor prescription use compliance.    Expected Outcomes Short Term: Continued assessment and intervention until BP is < 140/65mm HG in hypertensive participants. < 130/58mm HG in hypertensive participants with diabetes, heart failure or chronic kidney disease.;Long Term:  Maintenance of blood pressure at goal levels.    Lipids Yes    Intervention Provide education and support for participant on nutrition & aerobic/resistive exercise along with prescribed medications to achieve LDL 70mg , HDL >40mg .    Expected Outcomes Short Term: Participant states understanding of desired cholesterol values and is compliant with medications prescribed. Participant is following exercise prescription and nutrition guidelines.;Long Term: Cholesterol controlled with medications as prescribed, with individualized exercise RX and with personalized nutrition plan. Value goals: LDL < 70mg , HDL > 40 mg.          Education:Diabetes - Individual verbal and written instruction to review signs/symptoms of diabetes, desired ranges of glucose level fasting, after meals and with exercise. Acknowledge that pre and post exercise glucose checks will be done for 3 sessions at entry of program.   Core Components/Risk Factors/Patient Goals Review:    Core Components/Risk Factors/Patient Goals at Discharge (Final Review):    ITP Comments:  ITP Comments     Row Name 01/27/24 1541 01/31/24 1509 02/01/24 0749       ITP Comments Initial phone call completed. Diagnosis can be found in [facility] 8/2. EP Orientation scheduled for Tuesday 8/26 at 1:30. Completed and gym orientation for cardiac rehab. Initial ITP created and sent for review to Dr. Oneil Pinal, Medical Director. 30 Day review completed. Medical Director ITP review done, changes made as directed, and signed approval by Medical Director. New to Program.        Comments: 30 day review

## 2024-02-02 ENCOUNTER — Ambulatory Visit (INDEPENDENT_AMBULATORY_CARE_PROVIDER_SITE_OTHER): Admitting: Clinical

## 2024-02-02 DIAGNOSIS — F4322 Adjustment disorder with anxiety: Secondary | ICD-10-CM

## 2024-02-02 NOTE — Progress Notes (Signed)
 Time: 12:00 pm-12:58 pm CPT Code: 09162E-04 Diagnosis: F43.22  Tracey Wiley was seen remotely using secure video conferencing. She and the therapist were in their respective homes at time of appointment. Client is aware of risks of telehealth and consented to a virtual visit. Session focused on stress Tracey Wiley continues to experience related to her recent heart attack. She reflected upon steps she had taken while experiencing jaw pain over the past few days, in order to determine whether she was experiencing another heart attack, and to manage anxiety in this domain. She is scheduled to begin cardiovascular rehabilitation in the coming weeks. Therapist pointed out that this will hopefully both rebuild her strength and help her to rebuild confidence and trust in her body. She is scheduled to be seen again in one week.  Treatment Plan Client Abilities/Strengths Tracey Wiley shared that she has participated in therapy in the past and has been very honest in session.  Client Treatment Preferences:  Tracey Wiley prefers in person appointments. She prefers Tuesday and Thursday afternoon appointments. Client Statement of Needs  Tracey Wiley is seeking validation of her emotional responses and overall self-concept. Treatment Level  Weekly   Symptoms Tearfulness, irritability Problems Addressed  Goals Tracey Wiley will reduce symptoms of depression and improve overall mood 1. Tracey Wiley will increase comfort in social situations Objective  Target Date: 01/07/2025 Frequency: Weekly  Progress: 0 Modality: Individual therapy  Objective  Target Date: 01/06/2025 Frequency: Weekly  Progress: 0 Modality: individual therapy  Related Interventions  Tracey Wiley will have opportunities to process her experiences in session  Therapist will point out maladaptive thought and behavior patterns using CBT strategies. Therapist will incorporate behavior activation as appropriate. Therapist will incorporate gradual exposure therapy as appropriate. Therapist will provide  referrals for additional resource as appropriate.  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

## 2024-02-03 ENCOUNTER — Ambulatory Visit: Admitting: Gastroenterology

## 2024-02-03 ENCOUNTER — Encounter: Payer: Self-pay | Admitting: Gastroenterology

## 2024-02-03 VITALS — BP 126/68 | HR 68 | Ht 64.8 in | Wt 213.0 lb

## 2024-02-03 DIAGNOSIS — R1310 Dysphagia, unspecified: Secondary | ICD-10-CM | POA: Diagnosis not present

## 2024-02-03 DIAGNOSIS — R1011 Right upper quadrant pain: Secondary | ICD-10-CM | POA: Diagnosis not present

## 2024-02-03 DIAGNOSIS — R1319 Other dysphagia: Secondary | ICD-10-CM

## 2024-02-03 DIAGNOSIS — Z800 Family history of malignant neoplasm of digestive organs: Secondary | ICD-10-CM

## 2024-02-03 DIAGNOSIS — I252 Old myocardial infarction: Secondary | ICD-10-CM

## 2024-02-03 DIAGNOSIS — R140 Abdominal distension (gaseous): Secondary | ICD-10-CM

## 2024-02-03 DIAGNOSIS — Z7902 Long term (current) use of antithrombotics/antiplatelets: Secondary | ICD-10-CM

## 2024-02-03 NOTE — Patient Instructions (Signed)
 Keep your appointment with Dr Avram on 03/29/2024  You have been scheduled for an abdominal ultrasound at [facility] (1st floor of hospital) on 02/14/2024 at 9:30am. Please arrive 30 minutes prior to your appointment for registration. Make certain not to have anything to eat or drink 8  hours prior to your appointment. Should you need to reschedule your appointment, please contact radiology at [phone]. This test typically takes about 30 minutes to perform.  Due to recent changes in healthcare laws, you may see the results of your imaging and laboratory studies on MyChart before your provider has had a chance to review them.  We understand that in some cases there may be results that are confusing or concerning to you. Not all laboratory results come back in the same time frame and the provider may be waiting for multiple results in order to interpret others.  Please give us  48 hours in order for your provider to thoroughly review all the results before contacting the office for clarification of your results.    _______________________________________________________  If your blood pressure at your visit was 140/90 or greater, please contact your primary care physician to follow up on this.  _______________________________________________________  If you are age 40 or older, your body mass index should be between 23-30. Your Body mass index is 35.66 kg/m. If this is out of the aforementioned range listed, please consider follow up with your Primary Care Provider.  If you are age 53 or younger, your body mass index should be between 19-25. Your Body mass index is 35.66 kg/m. If this is out of the aformentioned range listed, please consider follow up with your Primary Care Provider.   ________________________________________________________  The [facility] providers would like to encourage you to use MYCHART to communicate with providers for non-urgent requests or questions.  Due to  long hold times on the telephone, sending your provider a message by [facility] may be a faster and more efficient way to get a response.  Please allow 48 business hours for a response.  Please remember that this is for non-urgent requests.  _______________________________________________________  [facility] is using a team-based approach to care.  Your team is made up of your doctor and two to three APPS. Our APPS (Nurse Practitioners and Physician Assistants) work with your physician to ensure care continuity for you. They are fully qualified to address your health concerns and develop a treatment plan. They communicate directly with your gastroenterologist to care for you. Seeing the Advanced Practice Practitioners on your physician's team can help you by facilitating care more promptly, often allowing for earlier appointments, access to diagnostic testing, procedures, and other specialty referrals.    I appreciate the  opportunity to care for you  Thank You   Camie Heinz,PA-C
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
## 2024-02-08 ENCOUNTER — Ambulatory Visit

## 2024-02-09 ENCOUNTER — Ambulatory Visit (INDEPENDENT_AMBULATORY_CARE_PROVIDER_SITE_OTHER): Admitting: Clinical

## 2024-02-09 DIAGNOSIS — F4322 Adjustment disorder with anxiety: Secondary | ICD-10-CM | POA: Diagnosis not present

## 2024-02-09 NOTE — Progress Notes (Signed)
 Time: 12:00 pm-12:58 pm CPT Code: 09162E-04 Diagnosis: F43.22  Tracey Wiley was seen remotely using secure video conferencing. She and the therapist were in their respective homes at time of appointment. Client is aware of risks of telehealth and consented to a virtual visit. Session included discussion of Polina's continued anxiety related to her heart attack. Therapist shared a square breathing exercise, and she indicated a plan to practice it. Session also included exploration of issues that had arisen in her relationship. She is scheduled to be seen again in one week.  Treatment Plan Client Abilities/Strengths Alyssha shared that she has participated in therapy in the past and has been very honest in session.  Client Treatment Preferences:  Trulee prefers in person appointments. She prefers Tuesday and Thursday afternoon appointments. Client Statement of Needs  Salli is seeking validation of her emotional responses and overall self-concept. Treatment Level  Weekly   Symptoms Tearfulness, irritability Problems Addressed  Goals Kanna will reduce symptoms of depression and improve overall mood 1. Malala will increase comfort in social situations Objective  Target Date: 01/07/2025 Frequency: Weekly  Progress: 0 Modality: Individual therapy  Objective  Target Date: 01/06/2025 Frequency: Weekly  Progress: 0 Modality: individual therapy  Related Interventions  Kilani will have opportunities to process her experiences in session  Therapist will point out maladaptive thought and behavior patterns using CBT strategies. Therapist will incorporate behavior activation as appropriate. Therapist will incorporate gradual exposure therapy as appropriate. Therapist will provide referrals for additional resource as appropriate.  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

## 2024-02-13 ENCOUNTER — Ambulatory Visit

## 2024-02-13 ENCOUNTER — Encounter

## 2024-02-14 ENCOUNTER — Ambulatory Visit

## 2024-02-15 ENCOUNTER — Ambulatory Visit

## 2024-02-15 ENCOUNTER — Encounter

## 2024-02-16 ENCOUNTER — Ambulatory Visit (INDEPENDENT_AMBULATORY_CARE_PROVIDER_SITE_OTHER): Admitting: Clinical

## 2024-02-16 DIAGNOSIS — F4322 Adjustment disorder with anxiety: Secondary | ICD-10-CM | POA: Diagnosis not present

## 2024-02-16 NOTE — Progress Notes (Signed)
 Time: 12:02 pm-12:58 pm CPT Code: 09162E-04 Diagnosis: F43.22  Tracey Wiley was seen remotely using secure video conferencing. She and the therapist were in their respective homes at time of appointment. Client is aware of risks of telehealth and consented to a virtual visit. Session focused on processing her experience of a recent appointment with her rheumatologist, including her frustrations in navigating her medical care. Therapist offered validation and support, and engaged her in consideration of her options. Therapist also encouraged her to explore coping strategies and pleasant events she can schedule, and she indicated a plan to read a new book while enjoying tea and pumpkin bread. She is scheduled to be seen again in one week.  Treatment Plan Client Abilities/Strengths Brisa shared that she has participated in therapy in the past and has been very honest in session.  Client Treatment Preferences:  Manasi prefers in person appointments. She prefers Tuesday and Thursday afternoon appointments. Client Statement of Needs  Caya is seeking validation of her emotional responses and overall self-concept. Treatment Level  Weekly   Symptoms Tearfulness, irritability Problems Addressed  Goals Dilpreet will reduce symptoms of depression and improve overall mood 1. Macarena will increase comfort in social situations Objective  Target Date: 01/07/2025 Frequency: Weekly  Progress: 0 Modality: Individual therapy  Objective  Target Date: 01/06/2025 Frequency: Weekly  Progress: 0 Modality: individual therapy  Related Interventions  Javana will have opportunities to process her experiences in session  Therapist will point out maladaptive thought and behavior patterns using CBT strategies. Therapist will incorporate behavior activation as appropriate. Therapist will incorporate gradual exposure therapy as appropriate. Therapist will provide referrals for additional resource as appropriate.  Diagnosis Axis none  Adjustment Disorder with Anxiety (F43.22)   Axis none    Conditions For Discharge Achievement of treatment goals and objectives    Andriette [name], PhD               Andriette [name], PhD               Andriette [name], PhD

## 2024-02-20 ENCOUNTER — Encounter: Attending: Cardiovascular Disease | Admitting: Emergency Medicine

## 2024-02-20 DIAGNOSIS — I214 Non-ST elevation (NSTEMI) myocardial infarction: Secondary | ICD-10-CM | POA: Insufficient documentation

## 2024-02-20 DIAGNOSIS — Z955 Presence of coronary angioplasty implant and graft: Secondary | ICD-10-CM | POA: Diagnosis present

## 2024-02-20 NOTE — Progress Notes (Signed)
 Daily Session Note  Patient Details  Name: Tracey Wiley MRN: [id number] Date of Birth: January 30, 1956 Referring Provider:   Flowsheet Row Cardiac Rehab from 01/31/2024 in [facility] Cardiac and Pulmonary Rehab  Referring Provider Dr. Evalene Lunger    Encounter Date: 02/20/2024  Check In:  Session Check In - 02/20/24 1358       Check-In   Supervising physician immediately available to respond to emergencies See telemetry face sheet for immediately available ER MD    Location [facility]-Cardiac & Pulmonary Rehab    Staff Present Rollene Paterson, MS, Exercise Physiologist;Noah Tickle, BS, Exercise Physiologist;Wilmont Olund RN,BSN;Meredith Tressa RN,BSN    Virtual Visit No    Medication changes reported     No    Fall or balance concerns reported    No    Tobacco Cessation No Change    Warm-up and Cool-down Performed on first and last piece of equipment    Resistance Training Performed Yes    VAD Patient? No    PAD/SET Patient? No      Pain Assessment   Currently in Pain? No/denies             Social History   Tobacco Use  Smoking Status Never  Smokeless Tobacco Never    Goals Met:  Independence with exercise equipment Exercise tolerated well No report of concerns or symptoms today Strength training completed today  Goals Unmet:  Not Applicable  Comments: First full day of exercise!  Patient was oriented to gym and equipment including functions, settings, policies, and procedures.  Patient's individual exercise prescription and treatment plan were reviewed.  All starting workloads were established based on the results of the 6 minute walk test done at initial orientation visit.  The plan for exercise progression was also introduced and progression will be customized based on patient's performance and goals.    Dr. Oneil Pinal is Medical Director for [facility] Cardiac Rehabilitation.  Dr. Fuad Aleskerov is Medical Director for [facility] Pulmonary Rehabilitation.

## 2024-02-21 ENCOUNTER — Ambulatory Visit
Admission: RE | Admit: 2024-02-21 | Discharge: 2024-02-21 | Disposition: A | Discharge status: Home / Self Care | Source: Ambulatory Visit | Attending: Gastroenterology | Admitting: Gastroenterology

## 2024-02-21 DIAGNOSIS — R1011 Right upper quadrant pain: Secondary | ICD-10-CM | POA: Diagnosis present

## 2024-02-21 DIAGNOSIS — R140 Abdominal distension (gaseous): Secondary | ICD-10-CM | POA: Diagnosis present

## 2024-02-22 ENCOUNTER — Encounter

## 2024-02-22 DIAGNOSIS — I214 Non-ST elevation (NSTEMI) myocardial infarction: Secondary | ICD-10-CM | POA: Diagnosis not present

## 2024-02-22 DIAGNOSIS — Z955 Presence of coronary angioplasty implant and graft: Secondary | ICD-10-CM

## 2024-02-22 NOTE — Progress Notes (Signed)
 Daily Session Note  Patient Details  Name: Tracey Wiley MRN: [id number] Date of Birth: 1955-12-16 Referring Provider:   Flowsheet Row Cardiac Rehab from 01/31/2024 in [facility] Cardiac and Pulmonary Rehab  Referring Provider Dr. Evalene Lunger    Encounter Date: 02/22/2024  Check In:  Session Check In - 02/22/24 1356       Check-In   Supervising physician immediately available to respond to emergencies See telemetry face sheet for immediately available ER MD    Location [facility]-Cardiac & Pulmonary Rehab    Staff Present Burnard Davenport RN,BSN,MPA;Joseph [facility] Dyane BS, ACSM CEP, Exercise Physiologist;Laureen Delores, BS, RRT, CPFT    Virtual Visit No    Medication changes reported     No    Fall or balance concerns reported    No    Tobacco Cessation No Change    Warm-up and Cool-down Performed on first and last piece of equipment    Resistance Training Performed Yes    VAD Patient? No    PAD/SET Patient? No      Pain Assessment   Currently in Pain? No/denies             Social History   Tobacco Use  Smoking Status Never  Smokeless Tobacco Never    Goals Met:  Independence with exercise equipment Exercise tolerated well No report of concerns or symptoms today Strength training completed today  Goals Unmet:  Not Applicable  Comments: Pt able to follow exercise prescription today without complaint.  Will continue to monitor for progression.    Dr. Oneil Pinal is Medical Director for [facility] Cardiac Rehabilitation.  Dr. Fuad Aleskerov is Medical Director for [facility] Pulmonary Rehabilitation.

## 2024-02-23 ENCOUNTER — Ambulatory Visit (INDEPENDENT_AMBULATORY_CARE_PROVIDER_SITE_OTHER): Admitting: Clinical

## 2024-02-23 DIAGNOSIS — F4322 Adjustment disorder with anxiety: Secondary | ICD-10-CM

## 2024-02-23 NOTE — Progress Notes (Signed)
 Time: 12:02 pm-12:58 pm CPT Code: 09162E-04 Diagnosis: F43.22  Tracey Wiley was seen remotely using secure video conferencing. She and the therapist were in their respective homes at time of appointment. Client is aware of risks of telehealth and consented to a virtual visit. 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 Therapist offered validation and support. She is scheduled to be seen again in one week.  Treatment Plan Client Abilities/Strengths Kenyon shared that she has participated in therapy in the past and has been very honest in session.  Client Treatment Preferences:  Sereen prefers in person appointments. She prefers Tuesday and Thursday afternoon appointments. Client Statement of Needs  Jennae is seeking validation of her emotional responses and overall self-concept. Treatment Level  Weekly   Symptoms Tearfulness, irritability Problems Addressed  Goals Arleene will reduce symptoms of depression and improve overall mood 1. Lashaunta will increase comfort in social situations Objective  Target Date: 01/07/2025 Frequency: Weekly  Progress: 0 Modality: Individual therapy  Objective  Target Date: 01/06/2025 Frequency: Weekly  Progress: 0 Modality: individual therapy  Related Interventions  Rylie will have opportunities to process her experiences in session  Therapist will point out maladaptive thought and behavior patterns using CBT strategies. Therapist will incorporate behavior activation as appropriate. Therapist will incorporate gradual exposure therapy as appropriate. Therapist will provide referrals for additional resource as appropriate.  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

## 2024-02-24 ENCOUNTER — Encounter: Admitting: Internal Medicine

## 2024-02-25 ENCOUNTER — Other Ambulatory Visit: Payer: Self-pay | Admitting: Cardiovascular Disease

## 2024-02-27 ENCOUNTER — Encounter: Admitting: *Deleted

## 2024-02-27 ENCOUNTER — Other Ambulatory Visit: Payer: Self-pay | Admitting: Gastroenterology

## 2024-02-27 ENCOUNTER — Ambulatory Visit: Payer: Self-pay | Admitting: Gastroenterology

## 2024-02-27 DIAGNOSIS — I214 Non-ST elevation (NSTEMI) myocardial infarction: Secondary | ICD-10-CM | POA: Diagnosis not present

## 2024-02-27 DIAGNOSIS — Z955 Presence of coronary angioplasty implant and graft: Secondary | ICD-10-CM

## 2024-02-27 NOTE — Progress Notes (Signed)
 Daily Session Note  Patient Details  Name: Tracey Wiley MRN: [id number] Date of Birth: 04-Jul-1955 Referring Provider:   Flowsheet Row Cardiac Rehab from 01/31/2024 in [facility] Cardiac and Pulmonary Rehab  Referring Provider Dr. Evalene Lunger    Encounter Date: 02/27/2024  Check In:  Session Check In - 02/27/24 1356       Check-In   Supervising physician immediately available to respond to emergencies See telemetry face sheet for immediately available ER MD    Location [facility]-Cardiac & Pulmonary Rehab    Staff Present Hoy Rodney RN,BSN;Noah Tickle, BS, Exercise Physiologist;Maxon Conetta BS, Exercise Physiologist;Margaret Best, MS, Exercise Physiologist    Virtual Visit No    Medication changes reported     No    Fall or balance concerns reported    No    Warm-up and Cool-down Performed on first and last piece of equipment    Resistance Training Performed Yes    VAD Patient? No    PAD/SET Patient? No      Pain Assessment   Currently in Pain? No/denies             Social History   Tobacco Use  Smoking Status Never  Smokeless Tobacco Never    Goals Met:  Independence with exercise equipment Exercise tolerated well No report of concerns or symptoms today Strength training completed today  Goals Unmet:  Not Applicable  Comments: Pt able to follow exercise prescription today without complaint.  Will continue to monitor for progression.'    Dr. Oneil Pinal is Medical Director for [facility] Cardiac Rehabilitation.  Dr. Fuad Aleskerov is Medical Director for [name] Pulmonary Rehabilitation.

## 2024-02-28 ENCOUNTER — Encounter (INDEPENDENT_AMBULATORY_CARE_PROVIDER_SITE_OTHER): Payer: Self-pay | Admitting: Nurse Practitioner

## 2024-02-28 ENCOUNTER — Ambulatory Visit (INDEPENDENT_AMBULATORY_CARE_PROVIDER_SITE_OTHER): Admitting: Nurse Practitioner

## 2024-02-28 ENCOUNTER — Other Ambulatory Visit: Payer: Self-pay

## 2024-02-28 VITALS — BP 125/61 | HR 64 | Resp 18 | Ht 64.0 in | Wt 214.0 lb

## 2024-02-28 DIAGNOSIS — R600 Localized edema: Secondary | ICD-10-CM

## 2024-02-28 DIAGNOSIS — R1319 Other dysphagia: Secondary | ICD-10-CM
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
## 2024-02-29 ENCOUNTER — Encounter

## 2024-02-29 ENCOUNTER — Encounter: Payer: Self-pay | Admitting: *Deleted

## 2024-02-29 ENCOUNTER — Other Ambulatory Visit: Payer: Self-pay | Admitting: Cardiology

## 2024-02-29 DIAGNOSIS — Z955 Presence of coronary angioplasty implant and graft: Secondary | ICD-10-CM

## 2024-02-29 DIAGNOSIS — I214 Non-ST elevation (NSTEMI) myocardial infarction: Secondary | ICD-10-CM

## 2024-02-29 NOTE — Progress Notes (Signed)
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 This causes significant stress in her life as she takes care of him every day and she does not get along with him. 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 She tries to find time for herself when she can between work, appointments, and caretaking. She is wanting to move forward with her life in the process of getting married to her fiance, but is overwhelmed and stressed from all of her responsibilities and recent health events to add wedding planning to the list. Her only support here is her fiance. She does see a psychotherapist weekly to help work on her stress concerns. She states she does not know how to deal with her stress as she just goes right back to work. We discussed ways to help reduce her stress daily. She practices  diaphragmatic breathing her psychotherapist taught her and she is enjoying coming to rehab as it makes her stop and rest when exercising. We discussed these extra  benefits regular exercise can give with dealing with stress. We reassessed her PHQ and her score went down to 5.       Expected Outcomes Short: Work on finding ways to reduce stress and cope with her multitude of stress throughout the day.  Long: Coninue to attend Cardiac Rehabilitiation for the benefits of exercise on stress.       Interventions Stress management education;Encouraged to attend Cardiac Rehabilitation for the exercise       Continue Psychosocial Services  Follow up required by staff          Psychosocial Discharge (Final Psychosocial Re-Evaluation):  Psychosocial Re-Evaluation - 02/27/24 1700       Psychosocial Re-Evaluation   Current issues with Current Stress Concerns    Comments Erline has a lot of stressors in her life. 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 This causes significant stress in her life as she takes care of him every day and she does not get along with him. 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 She tries to find time for herself when she can between work, appointments, and caretaking. She is wanting to move forward with her life in the process of getting married to her fiance, but is overwhelmed and stressed from all of her responsibilities and recent health events to add wedding planning to the list. Her only support here is her fiance. She does see a psychotherapist weekly to help work on her stress concerns. She states she does not know how to deal with her stress as she just goes right back to work. We discussed ways to help reduce her stress daily. She practices diaphragmatic breathing her psychotherapist taught her and she is enjoying coming  to rehab as it makes her stop and rest when exercising. We discussed these extra benefits regular exercise can give with dealing with stress. We reassessed her PHQ and her score went down to 5.    Expected Outcomes Short: Work on finding ways to reduce stress and cope with her multitude of stress throughout the day.  Long: Coninue to attend Cardiac Rehabilitiation for the benefits of exercise on stress.    Interventions Stress management education;Encouraged to attend Cardiac Rehabilitation for the exercise    Continue Psychosocial Services  Follow up required by staff          Vocational Rehabilitation: Provide vocational rehab assistance to qualifying candidates.   Vocational Rehab Evaluation & Intervention:  Vocational Rehab - 01/27/24 1528       Initial Vocational Rehab Evaluation & Intervention   Assessment shows need for Vocational Rehabilitation No          Education: Education Goals: Education classes will be provided on a variety of topics geared toward better understanding of heart health and risk factor modification. Participant will state understanding/return demonstration of topics presented as noted by education test scores.  Learning Barriers/Preferences:  Learning Barriers/Preferences - 01/27/24 1528       Learning Barriers/Preferences   Learning Barriers None    Learning Preferences None          General Cardiac Education Topics:  AED/CPR: - Group verbal and written instruction with the use of models to demonstrate the basic use of the AED with the basic ABC's of resuscitation.   Test and Procedures: - Group verbal and  visual presentation and models provide information about basic cardiac anatomy and function. Reviews the testing methods done to diagnose heart disease and the outcomes of the test results. Describes the treatment choices: Medical Management, Angioplasty, or Coronary Bypass Surgery for treating various heart conditions including Myocardial  Infarction, Angina, Valve Disease, and Cardiac Arrhythmias. Written material provided at class time.   Medication Safety: - Group verbal and visual instruction to review commonly prescribed medications for heart and lung disease. Reviews the medication, class of the drug, and side effects. Includes the steps to properly store meds and maintain the prescription regimen. Written material provided at class time. Flowsheet Row Cardiac Rehab from 01/31/2024 in [facility] Cardiac [facility] Rehab  Education need identified 01/31/24    Intimacy: - Group verbal instruction through game format to discuss how heart and lung disease can affect sexual intimacy. Written material provided at class time.   Know Your Numbers and Heart Failure: - Group verbal and visual instruction to discuss disease risk factors for cardiac and pulmonary disease and treatment options.  Reviews associated critical values for Overweight/Obesity, Hypertension, Cholesterol, and Diabetes.  Discusses basics of heart failure: signs/symptoms and treatments.  Introduces Heart Failure Zone chart for action plan for heart failure. Written material provided at class time.   Infection Prevention: - Provides verbal and written material to individual with discussion of infection control including proper hand washing and proper equipment cleaning during exercise session. Flowsheet Row Cardiac Rehab from 01/31/2024 in [facility] Cardiac [facility] Rehab  Date 01/31/24  Educator [facility]  Instruction Review Code 1- Verbalizes Understanding    Falls Prevention: - Provides verbal and written material to individual with discussion of falls prevention and safety. Flowsheet Row Cardiac Rehab from 01/31/2024 in [facility] Cardiac and Pulmonary Rehab  Date 01/31/24  Educator [facility]  Instruction Review Code 1- Verbalizes Understanding    Other: -Provides group and verbal instruction on various topics (see comments)   Knowledge Questionnaire Score:  Knowledge  Questionnaire Score - 01/31/24 1515       Knowledge Questionnaire Score   Pre Score 22/26          Core Components/Risk Factors/Patient Goals at Admission:  Personal Goals and Risk Factors at Admission - 01/27/24 1526       Core Components/Risk Factors/Patient Goals on Admission   Hypertension Yes    Intervention Provide education on lifestyle modifcations including regular physical activity/exercise, weight management, moderate sodium restriction and increased consumption of fresh fruit, vegetables, and low fat dairy, alcohol moderation, and smoking cessation.;Monitor prescription use compliance.    Expected Outcomes Short Term: Continued assessment and intervention until BP is < 140/84mm HG in hypertensive participants. < 130/38mm HG in hypertensive participants with diabetes, heart failure or chronic kidney disease.;Long Term: Maintenance of blood pressure at goal levels.    Lipids Yes    Intervention Provide education and support for participant on nutrition & aerobic/resistive exercise along with prescribed medications to achieve LDL 70mg , HDL >40mg .    Expected Outcomes Short Term: Participant states understanding of desired cholesterol values and is compliant with medications prescribed. Participant is following exercise prescription and nutrition guidelines.;Long Term: Cholesterol controlled with medications as prescribed, with individualized exercise RX and with personalized nutrition plan. Value goals: LDL < 70mg , HDL > 40 mg.          Education:Diabetes - Individual verbal and written instruction to review signs/symptoms of diabetes, desired ranges of glucose level fasting, after meals and with exercise. Acknowledge that pre and post exercise glucose  checks will be done for 3 sessions at entry of program.   Core Components/Risk Factors/Patient Goals Review:   Goals and Risk Factor Review     Row Name 02/27/24 1712             Core Components/Risk Factors/Patient  Goals Review   Personal Goals Review Hypertension       Review Aylanie states she is not checking her blood pressure regularly and we discussed adding that into her morning routine and she plans to after a few minutes of rest in the morning after she checks her weight. She is taking her hypertension medication regularly and is very disciplined with all her medications.       Expected Outcomes Short: Start checking blood pressure in the morning. Long: Continue managing hypertension and working on a heart healthy lifestyle.          Core Components/Risk Factors/Patient Goals at Discharge (Final Review):   Goals and Risk Factor Review - 02/27/24 1712       Core Components/Risk Factors/Patient Goals Review   Personal Goals Review Hypertension    Review Jadaya states she is not checking her blood pressure regularly and we discussed adding that into her morning routine and she plans to after a few minutes of rest in the morning after she checks her weight. She is taking her hypertension medication regularly and is very disciplined with all her medications.    Expected Outcomes Short: Start checking blood pressure in the morning. Long: Continue managing hypertension and working on a heart healthy lifestyle.          ITP Comments:  ITP Comments     Row Name 01/27/24 1541 01/31/24 1509 02/01/24 0749 02/20/24 1359 02/29/24 1156   ITP Comments Initial phone call completed. Diagnosis can be found in [facility] 8/2. EP Orientation scheduled for Tuesday 8/26 at 1:30. Completed and gym orientation for cardiac rehab. Initial ITP created and sent for review to Dr. Oneil Pinal, Medical Director. 30 Day review completed. Medical Director ITP review done, changes made as directed, and signed approval by Medical Director. New to Program. First full day of exercise!  Patient was oriented to gym and equipment including functions, settings, policies, and procedures.  Patient's individual exercise prescription and treatment  plan were reviewed.  All starting workloads were established based on the results of the 6 minute walk test done at initial orientation visit.  The plan for exercise progression was also introduced and progression will be customized based on patient's performance and goals. 30 Day review completed. Medical Director ITP review done, changes made as directed, and signed approval by Medical Director. New to program      Comments: 30 day review

## 2024-03-01 ENCOUNTER — Encounter: Payer: Self-pay | Admitting: Internal Medicine

## 2024-03-01 ENCOUNTER — Ambulatory Visit (INDEPENDENT_AMBULATORY_CARE_PROVIDER_SITE_OTHER): Admitting: Clinical

## 2024-03-01 DIAGNOSIS — F4322 Adjustment disorder with anxiety: Secondary | ICD-10-CM | POA: Diagnosis not present

## 2024-03-01 NOTE — Progress Notes (Signed)
 Time: 12:02 pm-12:58 pm CPT Code: 09162E-04 Diagnosis: F43.22  Tracey Wiley was seen remotely using secure video conferencing. She and the therapist were in their respective homes at time of appointment. Client is aware of risks of telehealth and consented to a virtual visit. Aretha reported upon developments with her health, including creating a plan of questions to ask in an upcoming appointment with her PCP. Session also included communication strategies for issues that had arisen in a friendship. She is scheduled to be seen again in one week.  Treatment Plan Client Abilities/Strengths Melisse shared that she has participated in therapy in the past and has been very honest in session.  Client Treatment Preferences:  Sharesa prefers in person appointments. She prefers Tuesday and Thursday afternoon appointments. Client Statement of Needs  Eduardo is seeking validation of her emotional responses and overall self-concept. Treatment Level  Weekly   Symptoms Tearfulness, irritability Problems Addressed  Goals Ileana will reduce symptoms of depression and improve overall mood 1. Mazella will increase comfort in social situations Objective  Target Date: 01/07/2025 Frequency: Weekly  Progress: 0 Modality: Individual therapy  Objective  Target Date: 01/06/2025 Frequency: Weekly  Progress: 0 Modality: individual therapy  Related Interventions  Minami will have opportunities to process her experiences in session  Therapist will point out maladaptive thought and behavior patterns using CBT strategies. Therapist will incorporate behavior activation as appropriate. Therapist will incorporate gradual exposure therapy as appropriate. Therapist will provide referrals for additional resource as appropriate.  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

## 2024-03-02 ENCOUNTER — Ambulatory Visit: Admitting: Internal Medicine

## 2024-03-02 VITALS — BP 120/72 | HR 67 | Ht 64.0 in | Wt 213.4 lb

## 2024-03-02 DIAGNOSIS — M6588 Other synovitis and tenosynovitis, other site: Secondary | ICD-10-CM

## 2024-03-02 DIAGNOSIS — Z803 Family history of malignant neoplasm of breast: Secondary | ICD-10-CM

## 2024-03-02 DIAGNOSIS — M1990 Unspecified osteoarthritis, unspecified site: Secondary | ICD-10-CM | POA: Diagnosis not present

## 2024-03-02 DIAGNOSIS — I2510 Atherosclerotic heart disease of native coronary artery without angina pectoris: Secondary | ICD-10-CM | POA: Diagnosis not present

## 2024-03-02 DIAGNOSIS — M791 Myalgia, unspecified site: Secondary | ICD-10-CM

## 2024-03-02 DIAGNOSIS — Z800 Family history of malignant neoplasm of digestive organs: Secondary | ICD-10-CM

## 2024-03-02 DIAGNOSIS — R609 Edema, unspecified: Secondary | ICD-10-CM

## 2024-03-02 DIAGNOSIS — T466X5A Adverse effect of antihyperlipidemic and antiarteriosclerotic drugs, initial encounter: Secondary | ICD-10-CM

## 2024-03-02 DIAGNOSIS — Z1211 Encounter for screening for malignant neoplasm of colon: Secondary | ICD-10-CM

## 2024-03-02 MED ORDER — NITROGLYCERIN 0.4 MG SL SUBL: 0.4000 mg | SUBLINGUAL_TABLET | SUBLINGUAL | 3 refills | Status: AC | PRN

## 2024-03-02 NOTE — Assessment & Plan Note (Signed)
 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  - Continue to monitor for now.

## 2024-03-02 NOTE — Patient Instructions (Addendum)
 Goal is 1200 mg calcium  daily   through diet and supplements   Algae Cal supplement   I have prescribed nitroglycerin  for the next occurrence of chest pain

## 2024-03-02 NOTE — Assessment & Plan Note (Addendum)
 Seen sept 9. 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 - Trial of anti-IL6 for PMR discussed at last visit due to worsening symptoms but pt declined. - Continue to monitor for now.

## 2024-03-02 NOTE — Assessment & Plan Note (Signed)
 S/p admission for NSTEMI with 95% occlusion of LAD treated with DES.  Continue asa, prasugrel  x 12 months.  Repatha , Zetia  and Omega 3 for LDL goal < 55 .  Metoprolol  and ARB .  Addition of SGLT 2 inhibitor discussed/

## 2024-03-02 NOTE — Assessment & Plan Note (Signed)
 Now taking Repatha , Zetia  and fish oil following admission for NSTEMI with 95 % occlusion of LAD  despite recent  coronary CT scan noting a calcium  score of zero. . Goal LDL is < 55 and has not been checked since discharge from August hospitaliation

## 2024-03-02 NOTE — Progress Notes (Signed)
 Subjective:  Patient ID: Tracey Wiley, female    DOB: 06/22/1955  Age: 68 y.o. MRN: [id number]  CC: The primary encounter diagnosis was Colon cancer screening. 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   HPI Tracey Wiley presents for  Chief Complaint  Patient presents with   Medical Management of Chronic Issues    3 month follow up    1) RUQ pain:  She was scheduled for EGD 01/20/2024 but had NSTEMI 01/07/2024 and procedure canceled. RUQ ultrasound was done Sept 16 and noted fatty liver changes.  No CBD dilation or stones .   2) CAD: . Hospitalized at [facility] from 8/2 - 01/10/2024 with an NSTEMI. Left heart catheterization revealed severe one-vessel disease greater than 95% stenosis proximal/mid LAD with successful PCI/DES with plan for dual antiplatelet therapy with aspirin  81 mg daily prasugrel  10 mg daily for at least 12 months aggressive secondary prevention of coronary artery disease with PCSK9 inhibitor therapy as an outpatient.  Had follow up on August 21 .  Taking prasugrel , asa,   repatha , Omega 3's and zetia .    3) persistent muscle pain,  mostly concerning the thighs, improves with prednisone ,  rheum workup has not included any   muscle biopsy , only labs and x rays.  Notes that the cardiac rehab is hurting the muscles more.     Lab Results  Component Value Date   CKTOTAL 84 03/02/2024   CKMB < 0.5 (L) 08/08/2012   TROPONINI < 0.02 08/08/2012     Outpatient Medications Prior to Visit  Medication Sig Dispense Refill   acetaminophen  (TYLENOL ) 500 MG tablet Take 500 mg by mouth daily as needed for pain.     aspirin  EC 81 MG tablet Take  81 mg by mouth daily.     cholecalciferol (VITAMIN D3) 25 MCG (1000 UNIT) tablet Take 1,000 Units by mouth daily.     colchicine  0.6 MG tablet TAKE 1 TABLET (0.6 MG TOTAL) BY MOUTH 2 (TWO) TIMES DAILY AS NEEDED. 180 tablet 0   cyanocobalamin (VITAMIN B12) 1000 MCG tablet Take 1,000 mcg by mouth daily.     esomeprazole  (NEXIUM ) 40 MG capsule TAKE 1 CAPSULE (40 MG TOTAL) BY MOUTH DAILY. 90 capsule 1   Evolocumab  (REPATHA  SURECLICK) 140 MG/ML SOAJ INJECT 140 MG INTO THE SKIN EVERY 14 (FOURTEEN) DAYS. 6 mL 3   ezetimibe  (ZETIA ) 10 MG tablet Take 1 tablet (10 mg total) by mouth daily. 90 tablet 3   famotidine  (PEPCID ) 20 MG tablet Take 1 tablet (20 mg total) by mouth at bedtime. 30 tablet 2   Flaxseed, Linseed, (FLAX SEED OIL) 1000 MG CAPS Take 1 capsule by mouth at bedtime.     furosemide  (LASIX ) 20 MG tablet TAKE 1 TABLET BY MOUTH EVERY DAY AS NEEDED (Patient taking differently: Take 20 mg by mouth as needed.) 90 tablet 3   ipratropium (ATROVENT ) 0.06 % nasal spray USE 1 SPRAY IN EACH NOSTRIL 1-2 TIMES DAILY AS NEEDED TO CONTROL DRAINAGE 45 mL 0   levalbuterol  (XOPENEX  HFA) 45 MCG/ACT inhaler Inhale 2 puffs into the lungs every 4 (four) hours as needed for wheezing. 15 g 1   magnesium  gluconate (MAGONATE) 500 MG tablet Take 500 mg  by mouth at bedtime.     MELATONIN ER PO Take 6 mg by mouth at bedtime.     metoprolol  tartrate (LOPRESSOR ) 25 MG tablet Take 1 tablet (25 mg total) by mouth 2 (two) times daily. 60 tablet 11   Omega-3 Fatty Acids (OMEGA-3 FISH OIL PO) Take 1 capsule by mouth daily.     ondansetron  (ZOFRAN  ODT) 4 MG disintegrating tablet Take 1 tablet (4 mg total) by mouth every 8 (eight) hours as needed for nausea or vomiting. 20 tablet 0   potassium chloride  (KLOR-CON ) 10 MEQ tablet TAKE 1 TABLET (10 MEQ TOTAL) BY MOUTH DAILY AS NEEDED. (Patient taking differently: Take 10 mEq by mouth as needed.) 90 tablet 3   promethazine  (PHENERGAN ) 12.5 MG tablet Take 12.5 mg by mouth every 6 (six)  hours as needed for nausea or vomiting.     sucralfate  (CARAFATE ) 1 g tablet Take 1 tablet (1 g total) by mouth 2 (two) times daily. Place 1 tablet in 10 ml of warm water  and drink twice daily as needed. 60 tablet 2   valsartan  (DIOVAN ) 40 MG tablet Take 1 tablet (40 mg total) by mouth daily. 30 tablet 11   vitamin C (ASCORBIC ACID) 500 MG tablet Take 1,000 mg by mouth daily.     zinc  gluconate 50 MG tablet Take 50 mg by mouth daily.     zinc  oxide (MEIJER ZINC  OXIDE) 20 % ointment Apply 1 Application topically 2 (two) times daily as needed for irritation (to lip corners). 56.7 g 0   prasugrel  (EFFIENT ) 10 MG TABS tablet Take 10 mg by mouth daily.     cetirizine  (ZYRTEC ) 10 MG tablet TAKE 1 TABLET BY MOUTH EVERY DAY (Patient not taking: Reported on 02/28/2024) 90 tablet 4   No facility-administered medications prior to visit.    Review of Systems;  Patient denies headache, fevers, malaise, unintentional weight loss, skin rash, eye pain, sinus congestion and sinus pain, sore throat, dysphagia,  hemoptysis , cough, dyspnea, wheezing, chest pain, palpitations, orthopnea, edema, abdominal pain, nausea, melena, diarrhea, constipation, flank pain, dysuria, hematuria, urinary  Frequency, nocturia, numbness, tingling, seizures,  Focal weakness, Loss of consciousness,  Tremor, insomnia, depression, anxiety, and suicidal ideation.      Objective:  BP 120/72   Pulse 67   Ht 5' 4 (1.626 m)   Wt 213 lb 6.4 oz (96.8 kg)   SpO2 99%   BMI 36.63 kg/m   BP Readings from Last 3 Encounters:  03/02/24 120/72  02/28/24 125/61  02/03/24 126/68    Wt Readings from Last 3 Encounters:  03/02/24 213 lb 6.4 oz (96.8 kg)  02/28/24 214 lb (97.1 kg)  02/03/24 213 lb (96.6 kg)    Physical Exam  Lab Results  Component Value Date   HGBA1C 5.9 (H) 01/26/2024   HGBA1C 6.1 (H) 01/07/2024   HGBA1C 5.9 (H) 08/19/2023    Lab Results  Component Value Date   CREATININE 0.70 03/02/2024   CREATININE 0.79  01/26/2024   CREATININE 0.70 01/10/2024    Lab Results  Component Value Date   WBC 7.6 01/26/2024   HGB 12.0 01/26/2024   HCT 38.1 01/26/2024   PLT 279 01/26/2024   GLUCOSE 96 03/02/2024   CHOL 114 03/02/2024   TRIG 131 03/02/2024   HDL 45 03/02/2024   LDLDIRECT 159 (H) 10/13/2022   LDLCALC 46 03/02/2024   ALT 11 03/02/2024   AST 17 03/02/2024   NA 143 03/02/2024   K 4.5 03/02/2024  CL 105 03/02/2024   CREATININE 0.70 03/02/2024   BUN 13 03/02/2024   CO2 24 03/02/2024   TSH 2.52 05/17/2023   HGBA1C 5.9 (H) 01/26/2024    US  Abdomen Limited RUQ (LIVER/GB) Result Date: 02/26/2024 CLINICAL DATA:  Right upper quadrant pain. EXAM: ULTRASOUND ABDOMEN LIMITED RIGHT UPPER QUADRANT COMPARISON:  CT chest and upper abdomen without contrast 07/10/2013. FINDINGS: Gallbladder: No gallstones or wall thickening visualized. No sonographic Murphy sign noted by sonographer. Common bile duct: Diameter: 3.3 mm.  No intrahepatic biliary dilatation. Liver: No focal lesion identified. There is mild increased liver echogenicity suggesting at least mild steatosis. Portal vein is patent on color Doppler imaging with normal direction of blood flow towards the liver. Other: No right upper quadrant ascites. IMPRESSION: 1. No evidence of cholelithiasis or acute cholecystitis. 2. Increased liver echogenicity suggesting at least mild steatosis. Electronically Signed   By: Francis Quam M.D.   On: 02/26/2024 01:39    Assessment & Plan:  .Colon cancer screening -     Ambulatory referral to Gastroenterology -     Lipid panel -     Apolipoprotein B -     Comprehensive metabolic panel with GFR  Family history of colon cancer -     Ambulatory referral to Gastroenterology  Family history of breast cancer -     Ambulatory Referral to Breast Specialist  Family history of stomach cancer -     Ambulatory referral to Gastroenterology  Coronary artery disease involving native coronary artery of native heart  without angina pectoris Assessment & Plan: S/p admission for NSTEMI with 95% occlusion of LAD treated with DES.  Continue asa, prasugrel  x 12 months.  Repatha , Zetia  and Omega 3 for LDL goal < 55 .  Metoprolol  and ARB .  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 - Trial of anti-IL6 for PMR discussed at last visit due to worsening symptoms but pt declined. - Continue to monitor for now.    Inflammatory arthritis Assessment & Plan: 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  - Continue to monitor for now.   Orders: -     C-reactive protein -     Sedimentation rate  Myalgia due to statin Assessment & Plan: She had myalgias on zocor , which was stopped in 2021 . Other statin trials (crestor , pituvastatin also not tolerated)   Orders: -     CK  Other orders -     Nitroglycerin ; Place 1 tablet (0.4 mg total) under the tongue every 5 (five) minutes as needed for chest pain.  Dispense: 50 tablet; Refill: 3    I personally spent a total of 40 minutes in the care of the patient today including preparing to see the patient,  getting/reviewing separately obtained history, performing a medically appropriate exam/evaluation, counseling and educating, placing orders,  and independently interpreting results.  Follow-up: No follow-ups on file.   Tracey LITTIE Kettering, MD

## 2024-03-02 NOTE — Assessment & Plan Note (Signed)
 She had myalgias on zocor , which was stopped in 2021 . Other statin trials (crestor , pituvastatin also not tolerated)

## 2024-03-03 LAB — COMPREHENSIVE METABOLIC PANEL WITH GFR
ALT: 11 IU/L (ref 0–32)
AST: 17 IU/L (ref 0–40)
Albumin: 4 g/dL (ref 3.9–4.9)
Alkaline Phosphatase: 100 IU/L (ref 49–135)
BUN/Creatinine Ratio: 19 (ref 12–28)
BUN: 13 mg/dL (ref 8–27)
Bilirubin Total: 0.4 mg/dL (ref 0.0–1.2)
CO2: 24 mmol/L (ref 20–29)
Calcium: 9 mg/dL (ref 8.7–10.3)
Chloride: 105 mmol/L (ref 96–106)
Creatinine, Ser: 0.7 mg/dL (ref 0.57–1.00)
Globulin, Total: 2.9 g/dL (ref 1.5–4.5)
Glucose: 96 mg/dL (ref 70–99)
Potassium: 4.5 mmol/L (ref 3.5–5.2)
Sodium: 143 mmol/L (ref 134–144)
Total Protein: 6.9 g/dL (ref 6.0–8.5)
eGFR: 95 mL/min/1.73

## 2024-03-03 LAB — LIPID PANEL
Chol/HDL Ratio: 2.5 ratio (ref 0.0–4.4)
Cholesterol, Total: 114 mg/dL (ref 100–199)
HDL: 45 mg/dL (ref >39)
LDL Chol Calc (NIH): 46 mg/dL (ref 0–99)
Triglycerides: 131 mg/dL (ref 0–149)
VLDL Cholesterol Cal: 23 mg/dL (ref 5–40)

## 2024-03-03 LAB — SEDIMENTATION RATE: Sed Rate: 54 mm/h — ABNORMAL HIGH (ref 0–40)

## 2024-03-03 LAB — CK: Total CK: 84 U/L (ref 32–182)

## 2024-03-03 LAB — C-REACTIVE PROTEIN: CRP: 21 mg/L — ABNORMAL HIGH (ref 0–10)

## 2024-03-03 LAB — APOLIPOPROTEIN B: Apolipoprotein B: 58 mg/dL

## 2024-03-04 ENCOUNTER — Ambulatory Visit: Payer: Self-pay | Admitting: Internal Medicine

## 2024-03-04 ENCOUNTER — Encounter: Payer: Self-pay | Admitting: Internal Medicine

## 2024-03-05 ENCOUNTER — Ambulatory Visit

## 2024-03-05 ENCOUNTER — Encounter: Admitting: Emergency Medicine

## 2024-03-05 DIAGNOSIS — Z955 Presence of coronary angioplasty implant and graft: Secondary | ICD-10-CM

## 2024-03-05 DIAGNOSIS — I214 Non-ST elevation (NSTEMI) myocardial infarction: Secondary | ICD-10-CM | POA: Diagnosis not present

## 2024-03-05 NOTE — Progress Notes (Signed)
 Daily Session Note  Patient Details  Name: Tracey Wiley MRN: [id number] Date of Birth: [birth year] Referring Provider:   Flowsheet Row Cardiac Rehab from 01/31/2024 in [facility] Cardiac and Pulmonary Rehab  Referring Provider Dr. Evalene Lunger    Encounter Date: 03/05/2024  Check In:  Session Check In - 03/05/24 1351       Check-In   Supervising physician immediately available to respond to emergencies See telemetry face sheet for immediately available ER MD    Location [facility]-Cardiac & Pulmonary Rehab    Staff Present Rollene Paterson, MS, Exercise Physiologist;Noah Tickle, BS, Exercise Physiologist;Maxon Conetta BS, Exercise Physiologist;Witt Plitt RN,BSN    Virtual Visit No    Medication changes reported     No    Fall or balance concerns reported    No    Tobacco Cessation No Change    Warm-up and Cool-down Performed on first and last piece of equipment    Resistance Training Performed Yes    VAD Patient? No    PAD/SET Patient? No      Pain Assessment   Currently in Pain? No/denies             Social History   Tobacco Use  Smoking Status Never  Smokeless Tobacco Never    Goals Met:  Independence with exercise equipment Exercise tolerated well Personal goals reviewed No report of concerns or symptoms today Strength training completed today  Goals Unmet:  Not Applicable  Comments: Pt able to follow exercise prescription today without complaint.  Will continue to monitor for progression.    Reviewed home exercise with pt today.  Pt plans to use her community fitness center ([employer]) and continue to do her PT exercises at home for exercise.  Reviewed THR, pulse, RPE, sign and symptoms, pulse oximetery and when to call 911 or MD.  Also discussed weather considerations and indoor options.  Pt voiced understanding.    Dr. Oneil Pinal is Medical Director for [facility] Cardiac Rehabilitation.  Dr. Fuad Aleskerov is Medical Director for [facility]  Pulmonary Rehabilitation.

## 2024-03-06 ENCOUNTER — Encounter: Payer: Self-pay | Admitting: Pharmacist

## 2024-03-07 ENCOUNTER — Encounter: Attending: Cardiovascular Disease | Admitting: Emergency Medicine

## 2024-03-07 DIAGNOSIS — Z955 Presence of coronary angioplasty implant and graft: Secondary | ICD-10-CM | POA: Diagnosis present

## 2024-03-07 DIAGNOSIS — I214 Non-ST elevation (NSTEMI) myocardial infarction: Secondary | ICD-10-CM | POA: Insufficient documentation

## 2024-03-07 NOTE — Progress Notes (Signed)
 Daily Session Note  Patient Details  Name: Tracey Wiley MRN: [id number] Date of Birth: Jan 06, 1956 Referring Provider:   Flowsheet Row Cardiac Rehab from 01/31/2024 in [facility] Cardiac and Pulmonary Rehab  Referring Provider Dr. Evalene Lunger    Encounter Date: 03/07/2024  Check In:  Session Check In - 03/07/24 1355       Check-In   Supervising physician immediately available to respond to emergencies See telemetry face sheet for immediately available ER MD    Location [facility]-Cardiac & Pulmonary Rehab    Staff Present Burnard Hint BS, ACSM CEP, Exercise Physiologist;Noah Tickle, BS, Exercise Physiologist;Kelly Bollinger RN,BSN,MPA;Adysen Raphael RN,BSN    Virtual Visit No    Medication changes reported     No    Fall or balance concerns reported    No    Tobacco Cessation No Change    Warm-up and Cool-down Performed on first and last piece of equipment    Resistance Training Performed Yes    VAD Patient? No    PAD/SET Patient? No      Pain Assessment   Currently in Pain? No/denies             Social History   Tobacco Use  Smoking Status Never  Smokeless Tobacco Never    Goals Met:  Independence with exercise equipment Exercise tolerated well No report of concerns or symptoms today Strength training completed today  Goals Unmet:  Not Applicable  Comments: Pt able to follow exercise prescription today without complaint.  Will continue to monitor for progression.    Dr. Oneil Pinal is Medical Director for [facility] Cardiac Rehabilitation.  Dr. Fuad Aleskerov is Medical Director for [facility] Pulmonary Rehabilitation.

## 2024-03-08 ENCOUNTER — Ambulatory Visit: Admitting: Clinical

## 2024-03-12 ENCOUNTER — Encounter: Admitting: Emergency Medicine

## 2024-03-12 ENCOUNTER — Encounter

## 2024-03-12 ENCOUNTER — Encounter: Payer: Self-pay | Admitting: Internal Medicine

## 2024-03-12 DIAGNOSIS — I214 Non-ST elevation (NSTEMI) myocardial infarction: Secondary | ICD-10-CM

## 2024-03-12 DIAGNOSIS — Z955 Presence of coronary angioplasty implant and graft: Secondary | ICD-10-CM

## 2024-03-12 NOTE — Progress Notes (Signed)
 Assessment start time: 2:42 PM  Digestive issues/concerns: no known food allergies   24-hours Recall: B: eggs scrambled, sourdough bread, sometimes [place] sausage link L: skip, eats late breakfast D: meat, veggies, sometimes a starch  Beverages water   Education r/t nutrition plan: [employer] drinks mostly water . She eats breakfast late and usually skips lunch or will have a snack. She likes fruit as a snack. Also eating RX bars as snacks when on the go. Reviewed Rx bar labels and discussed what makes a food heart healthy or not. Educated on carbs per meals to help manage blood sugars and control A1C. Encouraged her to pair carbs with protein and healthy fats. Provided Mediterranean diet handout. Educated on types of fats, sources, and how to read labels to find unmarked nutrients like healthy unsaturated fats. Provided guideline limits of less than 1500mg  sodium and less than 12g saturated fat.    Goal 1: Read labels and reduce sodium intake to below 2300mg . Ideally 1500mg  per day.  Goal 2: Reduce saturated fat, less than 12g per day. Replace bad fats for more heart healthy fats.  Goal 3: Eat 15-30gProtein and 30-60gCarbs at each meal.  End time 3:47 PM

## 2024-03-12 NOTE — Progress Notes (Signed)
 Daily Session Note  Patient Details  Name: Tracey Wiley MRN: [id number] Date of Birth: Oct 29, 1955 Referring Provider:   Flowsheet Row Cardiac Rehab from 01/31/2024 in [facility] Cardiac and Pulmonary Rehab  Referring Provider Dr. Evalene Lunger    Encounter Date: 03/12/2024  Check In:  Session Check In - 03/12/24 1346       Check-In   Supervising physician immediately available to respond to emergencies See telemetry face sheet for immediately available ER MD    Location [facility]-Cardiac & Pulmonary Rehab    Staff Present Leita Franks RN,BSN;[facility] BS, Exercise Physiologist;Margaret Best, MS, Exercise Physiologist    Virtual Visit No    Medication changes reported     No    Fall or balance concerns reported    No    Tobacco Cessation No Change    Warm-up and Cool-down Performed on first and last piece of equipment    Resistance Training Performed Yes    VAD Patient? No    PAD/SET Patient? No      Pain Assessment   Currently in Pain? No/denies             Social History   Tobacco Use  Smoking Status Never  Smokeless Tobacco Never    Goals Met:  Independence with exercise equipment Exercise tolerated well No report of concerns or symptoms today Strength training completed today  Goals Unmet:  Not Applicable  Comments: Pt able to follow exercise prescription today without complaint.  Will continue to monitor for progression.    Dr. Oneil Pinal is Medical Director for [facility] Cardiac Rehabilitation.  Dr. Fuad Aleskerov is Medical Director for [facility] Pulmonary Rehabilitation.

## 2024-03-13 ENCOUNTER — Ambulatory Visit (INDEPENDENT_AMBULATORY_CARE_PROVIDER_SITE_OTHER)

## 2024-03-13 ENCOUNTER — Ambulatory Visit: Admitting: Family

## 2024-03-13 ENCOUNTER — Encounter: Payer: Self-pay | Admitting: Family

## 2024-03-13 ENCOUNTER — Other Ambulatory Visit

## 2024-03-13 VITALS — BP 122/74 | HR 64 | Temp 98.0°F | Ht 64.0 in | Wt 213.0 lb

## 2024-03-13 DIAGNOSIS — M65312 Trigger thumb, left thumb: Secondary | ICD-10-CM | POA: Diagnosis not present

## 2024-03-13 DIAGNOSIS — E041 Nontoxic single thyroid nodule: Secondary | ICD-10-CM

## 2024-03-13 DIAGNOSIS — M79645 Pain in left finger(s): Secondary | ICD-10-CM | POA: Diagnosis not present

## 2024-03-13 DIAGNOSIS — M79644 Pain in right finger(s): Secondary | ICD-10-CM

## 2024-03-13 LAB — TSH: TSH: 2.49 u[IU]/mL (ref 0.35–5.50)

## 2024-03-13 MED ORDER — PREDNISONE 20 MG PO TABS: 20.0000 mg | ORAL_TABLET | Freq: Every day | ORAL | 0 refills | Status: DC
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
## 2024-03-13 NOTE — Progress Notes (Deleted)
   Acute Office Visit  Subjective:     Patient ID: Tracey Wiley, female    DOB: Aug 29, 1955, 68 y.o.   MRN: [id number]  Chief Complaint  Patient presents with   Hand Pain    HPI Patient is in today for ***  ROS      Objective:    BP 122/74   Pulse 64   Temp 98 F (36.7 C) (Oral)   Ht 5' 4 (1.626 m)   Wt 213 lb (96.6 kg)   SpO2 98%   BMI 36.56 kg/m  {Vitals History (Optional):23777}  Physical Exam  No results found for any visits on 03/13/24.      Assessment & Plan:   Problem List Items Addressed This Visit     Thyroid  cyst   Relevant Orders   TSH   Other Visit Diagnoses       Pain of right thumb    -  Primary   Relevant Orders   DG Finger Thumb Left     Trigger finger of left thumb       Relevant Orders   DG Finger Thumb Left       Meds ordered this encounter  Medications   predniSONE  (DELTASONE ) 20 MG tablet    Sig: Take 1 tablet (20 mg total) by mouth daily with breakfast.    Dispense:  5 tablet    Refill:  0    No follow-ups on file.  Tracey Lisa B Karlena Luebke, FNP

## 2024-03-14 ENCOUNTER — Ambulatory Visit: Payer: Self-pay | Admitting: Family

## 2024-03-14 ENCOUNTER — Encounter: Admitting: Emergency Medicine

## 2024-03-14 DIAGNOSIS — I214 Non-ST elevation (NSTEMI) myocardial infarction: Secondary | ICD-10-CM | POA: Diagnosis not present

## 2024-03-14 DIAGNOSIS — M65312 Trigger thumb, left thumb: Secondary | ICD-10-CM

## 2024-03-14 DIAGNOSIS — Z955 Presence of coronary angioplasty implant and graft: Secondary | ICD-10-CM

## 2024-03-14 DIAGNOSIS — M79645 Pain in left finger(s): Secondary | ICD-10-CM

## 2024-03-14 NOTE — Progress Notes (Signed)
 Daily Session Note  Patient Details  Name: Tracey Wiley MRN: [id number] Date of Birth: [birth year] Referring Provider:   Flowsheet Row Cardiac Rehab from 01/31/2024 in [facility]  Referring Provider Dr. Evalene Lunger    Encounter Date: 03/14/2024  Check In:  Session Check In - 03/14/24 1406       Check-In   Supervising physician immediately available to respond to emergencies See telemetry face sheet for immediately available ER MD    Location [facility]    Staff Present Devaughn Jaeger, BS, Exercise Physiologist;Kelly Dyane HECKLE, ACSM CEP, Exercise Physiologist;Caniya Tagle RN,BSN;Kelly Bollinger [facility]    Virtual Visit No    Medication changes reported     No    Fall or balance concerns reported    No    Tobacco Cessation No Change    Warm-up and Cool-down Performed on first and last piece of equipment    Resistance Training Performed Yes    VAD Patient? No    PAD/SET Patient? No      Pain Assessment   Currently in Pain? No/denies             Social History   Tobacco Use  Smoking Status Never  Smokeless Tobacco Never    Goals Met:  Independence with exercise equipment Exercise tolerated well No report of concerns or symptoms today Strength training completed today  Goals Unmet:  Not Applicable  Comments: Pt able to follow exercise prescription today without complaint.  Will continue to monitor for progression.    Dr. Oneil Pinal is Medical Director for [facility] Cardiac Rehabilitation.  Dr. Fuad Aleskerov is Medical Director for [facility] Pulmonary Rehabilitation.

## 2024-03-15 ENCOUNTER — Encounter: Payer: Self-pay | Admitting: Family

## 2024-03-15 ENCOUNTER — Ambulatory Visit (INDEPENDENT_AMBULATORY_CARE_PROVIDER_SITE_OTHER): Admitting: Clinical

## 2024-03-15 DIAGNOSIS — F4322 Adjustment disorder with anxiety: Secondary | ICD-10-CM | POA: Diagnosis not present

## 2024-03-15 NOTE — Progress Notes (Addendum)
 Time: 12:02 pm-12:58 pm CPT Code: 09162E-04 Diagnosis: F43.22  Tracey Wiley was seen remotely using secure video conferencing. She was in her home and therapist was in her office at time of  appointment. Client is aware of risks of telehealth and consented to a virtual visit. She reported upon additional health issues that had arisen in the last week (Trigger finger in her thumb), as well as developments in her relationships. Therapist offered validation and support, encouraging her to consider scheduling self care and pleasant events as possible. She is scheduled to be seen again in one week.  Treatment Plan Client Abilities/Strengths Tracey Wiley shared that she has participated in therapy in the past and has been very honest in session.  Client Treatment Preferences:  Tracey Wiley prefers in person appointments. She prefers Tuesday and Thursday afternoon appointments. Client Statement of Needs  Tracey Wiley is seeking validation of her emotional responses and overall self-concept. Treatment Level  Weekly   Symptoms Tearfulness, irritability Problems Addressed  Goals Tracey Wiley will reduce symptoms of depression and improve overall mood 1. Tracey Wiley will increase comfort in social situations Objective  Target Date: 01/07/2025 Frequency: Weekly  Progress: 0 Modality: Individual therapy  Objective  Target Date: 01/06/2025 Frequency: Weekly  Progress: 0 Modality: individual therapy  Related Interventions  Leler will have opportunities to process her experiences in session  Therapist will point out maladaptive thought and behavior patterns using CBT strategies. Therapist will incorporate behavior activation as appropriate. Therapist will incorporate gradual exposure therapy as appropriate. Therapist will provide referrals for additional resource as appropriate.  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

## 2024-03-19 ENCOUNTER — Encounter

## 2024-03-20 ENCOUNTER — Ambulatory Visit: Payer: Self-pay

## 2024-03-20 ENCOUNTER — Ambulatory Visit

## 2024-03-20 NOTE — Telephone Encounter (Signed)
 FYI pt is scheduled to see Dr. Franchot tomorrow.

## 2024-03-20 NOTE — Telephone Encounter (Signed)
 Appointment made 03/21/2024 at 1pm with Dr Isaiah Pepper due to no availability at PCP Office    FYI Only or Action Required?: FYI only for provider.  Patient was last seen in primary care on 03/13/2024 by Douglass Kenney NOVAK, FNP.  Called Nurse Triage reporting Cough.  Symptoms began 2 days ago.  Interventions attempted: OTC medications: Tylenol  and Rest, hydration, or home remedies.  Symptoms are: gradually worsening.  Triage Disposition: See Physician Within 24 Hours  Patient/caregiver understands and will follow disposition?: Yes      Message from [facility] S sent at 03/20/2024  3:15 PM EDT  Reason for Triage: has been feeling lousy, and coughing that feels like fire in chest. had fever 99.1 03/19/2024 call back number [phone]   Reason for Disposition  [1] Continuous (nonstop) coughing interferes with work or school AND [2] no improvement using cough treatment per Care Advice  Answer Assessment - Initial Assessment Questions Patient having a dry hacking cough the past two days and she states every year she gets something similar to this and wants to get ahead of it before it gets any worse Wants to rule out covid or flu Patient states she has taken Tylenol  but no other kind of cold medications bc she doesn't know what all she can take with her medications Patient states she is not having difficulty breathing or chest pain She states the only time her chest is sore is with coughing No chest soreness or pain during triage with this RN Patient states she is keeping an eye on her heart rate, blood pressure, and pulse ox and she states that her vitals have been normal & she is drinking a lot of water  Patient is very aware of her health and states that if anything changes she will seek immediate medical attention Patient is advised to call us  back if anything changes or with any further questions/concerns. Patient is advised that if anything worsens to go to the Emergency  Room. Patient verbalized understanding.   1. ONSET: When did the cough begin?      A few days ago 2. SEVERITY: How bad is the cough today?      continuous 3. SPUTUM: Describe the color of your sputum (e.g., none, dry cough; clear, white, yellow, green)     no 4. HEMOPTYSIS: Are you coughing up any blood? If Yes, ask: How much? (e.g., flecks, streaks, tablespoons, etc.)     no 5. DIFFICULTY BREATHING: Are you having difficulty breathing? If Yes, ask: How bad is it? (e.g., mild, moderate, severe)      no 6. FEVER: Do you have a fever? If Yes, ask: What is your temperature, how was it measured, and when did it start?     Patient states low grade  7. CARDIAC HISTORY: Do you have any history of heart disease? (e.g., heart attack, congestive heart failure)      Cardiac history present 8. LUNG HISTORY: Do you have any history of lung disease?  (e.g., pulmonary embolus, asthma, emphysema)     2008, undiagnosed lung disease---one doctor told pt asthma and one doctor told pt emphysema and another doctor states maybe copd 9. PE RISK FACTORS: Do you have a history of blood clots? (or: recent major surgery, recent prolonged travel, bedridden)     In 2008 pt states she had one 10. OTHER SYMPTOMS: Do you have any other symptoms? (e.g., runny nose, wheezing, chest pain)       Headache, runny nose, pt coughed up some  yellow phlegm while on the phone with this RN  Protocols used: Cough - Acute Non-Productive-A-AH

## 2024-03-21 ENCOUNTER — Ambulatory Visit: Admission: RE | Admit: 2024-03-21 | Discharge: 2024-03-21 | Disposition: A | Source: Ambulatory Visit

## 2024-03-21 ENCOUNTER — Other Ambulatory Visit: Payer: Self-pay | Admitting: Family

## 2024-03-21 ENCOUNTER — Other Ambulatory Visit: Payer: Self-pay | Admitting: Gastroenterology

## 2024-03-21 ENCOUNTER — Ambulatory Visit: Admission: RE | Admit: 2024-03-21 | Discharge: 2024-03-21 | Disposition: A | Discharge status: Home / Self Care

## 2024-03-21 ENCOUNTER — Encounter: Payer: Self-pay | Admitting: Pharmacist

## 2024-03-21 ENCOUNTER — Ambulatory Visit (INDEPENDENT_AMBULATORY_CARE_PROVIDER_SITE_OTHER)

## 2024-03-21 ENCOUNTER — Encounter

## 2024-03-21 ENCOUNTER — Ambulatory Visit: Payer: Self-pay

## 2024-03-21 VITALS — BP 133/69 | HR 66 | Temp 98.8°F | Ht 65.0 in | Wt 213.8 lb

## 2024-03-21 DIAGNOSIS — M65312 Trigger thumb, left thumb: Secondary | ICD-10-CM

## 2024-03-21 DIAGNOSIS — R051 Acute cough: Secondary | ICD-10-CM | POA: Diagnosis present

## 2024-03-21 LAB — POCT INFLUENZA A/B
Influenza A, POC: NEGATIVE
Influenza B, POC: NEGATIVE

## 2024-03-21 MED ORDER — AMOXICILLIN-POT CLAVULANATE 875-125 MG PO TABS: 1.0000 | ORAL_TABLET | Freq: Two times a day (BID) | ORAL | 0 refills | Status: AC

## 2024-03-21 NOTE — Progress Notes (Signed)
 Acute visit   Patient: Tracey Wiley   DOB: 07/21/1955   68 y.o. Female  MRN: [id number] PCP: Marylynn Verneita CROME, MD   Chief Complaint  Patient presents with   Acute Visit    Cough since Sunday evening that started as dry and has gradually worsened to productive cough associated with weird pain left shoulder blade. Hx of heart attack 2 months ago. Woke up with pain Tuesday, worse if inhaled, when woke up this morning it was gone. No residual aches. Finsihed prednisone  Saunday. RN Triage call completed on 03/20/24   Subjective    Discussed the use of AI scribe software for clinical note transcription with the patient, who gave verbal consent to proceed.  History of Present Illness Tracey Wiley is a 68 year old female with a history of heart attack who presents with a worsening cough.  She has been experiencing a cough that began on Sunday and has progressively worsened. Initially dry and hacking, the cough became productive with yellow-green, thick, sticky sputum by yesterday evening. She describes unusual chest sounds during a coughing fit in bed, including 'squeaking, chirping, and honking' noises, and a 'gurgling, cracking like Rice Krispies' sensation. No wheezing is noted, and she has not used prescribed inhalers or Flonase.  She recently completed a five-day course of prednisone  at 20 mg daily for a trigger finger, which did not improve. The trigger finger remains problematic, with the tendon locking without a splint. She is awaiting an orthopedist consultation for this issue.  She monitors her ankles for swelling and has not needed to use Lasix  as any swelling resolves overnight with elevation.  She reports a history of colds that rapidly progress to chest involvement without typical upper respiratory symptoms. This pattern has been consistent for many years, occurring every fall and winter, and has become more frequent since the COVID-19 pandemic. These episodes do not resolve  without aggressive treatment.  She has a history of postnasal drip during the winter months. She attributes occasional runny nose and mild facial soreness to Repatha  side effects. She has not started any new medications recently, aside from the prednisone  course.  She wears a mask year-round to prevent illness and has experienced social challenges due to this practice.   Review of systems as noted in HPI.   Objective    BP 133/69 (BP Location: Left Arm, Patient Position: Sitting, Cuff Size: Normal)   Pulse 66   Temp 98.8 F (37.1 C) (Oral)   Ht 5' 5 (1.651 m)   Wt 213 lb 12.8 oz (97 kg)   SpO2 99%   BMI 35.58 kg/m  Physical Exam Constitutional:      Appearance: Normal appearance.  HENT:     Head: Normocephalic and atraumatic.     Nose: Congestion present.     Mouth/Throat:     Mouth: Mucous membranes are moist.  Eyes:     Pupils: Pupils are equal, round, and reactive to light.  Cardiovascular:     Rate and Rhythm: Normal rate and regular rhythm.     Heart sounds: Normal heart sounds.  Pulmonary:     Effort: Pulmonary effort is normal. No respiratory distress.     Breath sounds: Rhonchi present. No wheezing.  Skin:    General: Skin is warm.  Neurological:     General: No focal deficit present.     Mental Status: She is alert.     Wt Readings from Last 3 Encounters:  03/21/24 213  lb 12.8 oz (97 kg)  03/13/24 213 lb (96.6 kg)  03/02/24 213 lb 6.4 oz (96.8 kg)     Results for orders placed or performed in visit on 03/21/24  POCT Influenza A/B  Result Value Ref Range   Influenza A, POC Negative Negative   Influenza B, POC Negative Negative    Assessment & Plan     Problem List Items Addressed This Visit   None Visit Diagnoses       Acute cough    -  Primary   Relevant Medications   amoxicillin -clavulanate (AUGMENTIN ) 875-125 MG tablet   Other Relevant Orders   DG Chest 2 View (Completed)   POCT Influenza A/B (Completed)      Assessment & Plan Acute  productive cough Patient with 4 day hx of productive cough with yellow-green sputum. Rhonchi heard on exam. Differential includes pneumonia vs. Viral infection. POC flu negative, COVID test unavailable in office today. Reviewed previous PFTs which were normal. Recent prednisone  use may have increased infection risk. - Order chest x-ray to evaluate for pneumonia. - Prescribe Augmentin  for 7 days. - Advise home COVID test. - Hold off on further steroid use for now    Meds ordered this encounter  Medications   amoxicillin -clavulanate (AUGMENTIN ) 875-125 MG tablet    Sig: Take 1 tablet by mouth 2 (two) times daily for 7 days.    Dispense:  14 tablet    Refill:  0     No follow-ups on file.      Isaiah DELENA Pepper, MD  [facility] [phone] (phone) [phone] (fax)

## 2024-03-22 ENCOUNTER — Ambulatory Visit (INDEPENDENT_AMBULATORY_CARE_PROVIDER_SITE_OTHER): Admitting: Clinical

## 2024-03-22 DIAGNOSIS — F4322 Adjustment disorder with anxiety: Secondary | ICD-10-CM

## 2024-03-22 NOTE — Telephone Encounter (Signed)
 noted
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
## 2024-03-24 ENCOUNTER — Other Ambulatory Visit: Payer: Self-pay | Admitting: Cardiology

## 2024-03-26 ENCOUNTER — Encounter

## 2024-03-27 ENCOUNTER — Telehealth: Payer: Self-pay | Admitting: Gastroenterology

## 2024-03-27 ENCOUNTER — Encounter: Payer: Self-pay | Admitting: Cardiology

## 2024-03-27 ENCOUNTER — Ambulatory Visit: Attending: Cardiology | Admitting: Cardiology

## 2024-03-27 VITALS — BP 136/70 | HR 65 | Ht 65.0 in | Wt 213.6 lb

## 2024-03-27 DIAGNOSIS — I2510 Atherosclerotic heart disease of native coronary artery without angina pectoris: Secondary | ICD-10-CM | POA: Diagnosis not present

## 2024-03-27 DIAGNOSIS — E669 Obesity, unspecified: Secondary | ICD-10-CM

## 2024-03-27 DIAGNOSIS — I471 Supraventricular tachycardia, unspecified: Secondary | ICD-10-CM

## 2024-03-27 DIAGNOSIS — I10 Essential (primary) hypertension: Secondary | ICD-10-CM

## 2024-03-27 DIAGNOSIS — M069 Rheumatoid arthritis, unspecified: Secondary | ICD-10-CM

## 2024-03-27 DIAGNOSIS — E782 Mixed hyperlipidemia: Secondary | ICD-10-CM

## 2024-03-27 DIAGNOSIS — M353 Polymyalgia rheumatica: Secondary | ICD-10-CM | POA: Diagnosis not present

## 2024-03-27 NOTE — Telephone Encounter (Signed)
 Called and spoke with pt. She reports that she was supposed to have a f/u appt with Dr. Avram on 10/23 to discuss scheduling a EGD/Colon for the month of November. She reports that she saw her cardiologist today who recommended she call Dr. Avram to let him know that she would not give clearance for the pt to stop her Effient  for at least 6-12 months. The pt would like to know if she needs to keep the appt on 10/23 since she is not going to be able to have the EGD/Colon in November.
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
## 2024-03-27 NOTE — Telephone Encounter (Signed)
 I think she can cancel the 10/23 appt if she is not having any new problems (stable or better since visit w/ Camie Furbish)  She does have a ba swallow ordered by Camie for 10/28  Looks like 08/2024 would be 6 months after MI so let's place an EGD and colonoscopy recall for 08/2024

## 2024-03-27 NOTE — Patient Instructions (Signed)
 Medication Instructions:  Your physician recommends the following medication changes.  HOLD : Next dose of Repatha   *If you need a refill on your cardiac medications before your next appointment, please call your pharmacy*  Lab Work: No labs ordered today  If you have labs (blood work) drawn today and your tests are completely normal, you will receive your results only by: MyChart Message (if you have MyChart) OR A paper copy in the mail If you have any lab test that is abnormal or we need to change your treatment, we will call you to review the results.  Testing/Procedures: No test ordered today   Follow-Up: At [facility], you and your health needs are our priority.  As part of our continuing mission to provide you with exceptional heart care, our providers are all part of one team.  This team includes your primary Cardiologist (physician) and Advanced Practice Providers or APPs (Physician Assistants and Nurse Practitioners) who all work together to provide you with the care you need, when you need it.  Your next appointment:   2 month(s)  Provider:   You may see Timothy Gollan, MD or one of the following Advanced Practice Providers on your designated Care Team:   Tylene Lunch, NP

## 2024-03-27 NOTE — Telephone Encounter (Signed)
 Inbound call from patient stating she would like to speak to someone in regards to upcoming appointment on 10/23 with Dr.Gessner. patient states she's not sure if she needs to continue with appointment but would like to be advised on what to do since she recently had an heart attack and doesn't know if she'll be good to have the double procedure. Patient would like a call back either today or before 12 pm tomorrow due to having appointments tomorrow and wont be able to answer Please advise  Thank you

## 2024-03-28 ENCOUNTER — Ambulatory Visit (INDEPENDENT_AMBULATORY_CARE_PROVIDER_SITE_OTHER)

## 2024-03-28 ENCOUNTER — Encounter: Payer: Self-pay | Admitting: *Deleted

## 2024-03-28 ENCOUNTER — Encounter

## 2024-03-28 DIAGNOSIS — M158 Other polyosteoarthritis: Secondary | ICD-10-CM

## 2024-03-28 DIAGNOSIS — I214 Non-ST elevation (NSTEMI) myocardial infarction: Secondary | ICD-10-CM

## 2024-03-28 DIAGNOSIS — Z955 Presence of coronary angioplasty implant and graft: Secondary | ICD-10-CM

## 2024-03-28 NOTE — Telephone Encounter (Signed)
 Pt replied to my chart message. She reports that her symptoms have slightly improved and she feels like she is ok for now. I canceled the 03/29/24 appt with Dr. Avram. Pt did question as to whether there are labs specific for detecting stomach/colon cancer and if so would the provider be ok to order these labs.

## 2024-03-28 NOTE — Telephone Encounter (Signed)
 Attempted to reach pt via phone to discuss Dr. Darilyn recommendations. No answer. Left VM for pt to return call. Pt did report that she has several appts today, and it may be easier to communicate via my chart. Message sent to pt as well.

## 2024-03-28 NOTE — Patient Instructions (Signed)
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
## 2024-03-28 NOTE — Progress Notes (Signed)
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 carbs with protein and healthy fats. Provided Mediterranean diet handout. Educated on types of fats, sources, and how to read labels to find unmarked nutrients like healthy unsaturated fats. Provided guideline limits of less than 1500mg  sodium and less than 12g saturated fat.      Intervention Plan   Intervention Prescribe, educate and counsel regarding individualized specific dietary modifications aiming towards targeted core components such as weight, hypertension, lipid management, diabetes, heart failure and other comorbidities.;Nutrition handout(s) given to patient.    Expected Outcomes Long Term Goal: Adherence to prescribed nutrition plan.;Short Term Goal: A plan has been developed with personal nutrition goals  set during dietitian appointment.;Short Term Goal: Understand basic principles of dietary content, such as calories, fat, sodium, cholesterol and nutrients.           Nutrition Assessments:  MEDIFICTS Score Key: >=70 Need to make dietary changes  40-70 Heart Healthy Diet <= 40 Therapeutic Level Cholesterol Diet  Flowsheet Row Cardiac Rehab from 01/31/2024 in [facility] Cardiac [facility] Pulmonary Rehab  Picture Your Plate Total Score on Admission 71   Picture Your Plate Scores: <59 Unhealthy dietary pattern with much room for improvement. 41-50 Dietary pattern unlikely to meet recommendations for good health and room for improvement. 51-60 More healthful dietary pattern, with some room for improvement.  >60 Healthy dietary pattern, although there may be some specific behaviors that could be improved.    Nutrition Goals Re-Evaluation:   Nutrition Goals Discharge (Final Nutrition Goals Re-Evaluation):   Psychosocial: Target Goals: Acknowledge presence or absence of significant depression and/or stress, maximize coping skills, provide positive support system. Participant is able to verbalize types and ability to use techniques and skills needed for reducing stress and depression.   Education: Stress, Anxiety, and Depression - Group verbal and visual presentation to define topics covered.  Reviews how body is impacted by stress, anxiety, and depression.  Also discusses healthy ways to reduce stress and to treat/manage anxiety and depression. Written material provided at class time.   Education: Sleep Hygiene -Provides group verbal and written instruction about how sleep can affect your health.  Define sleep hygiene, discuss sleep cycles and impact of sleep habits. Review good sleep hygiene tips.   Initial Review & Psychosocial Screening:  Initial Psych Review & Screening - 01/27/24 1528       Initial Review   Current issues with Current Stress Concerns;Current Anxiety/Panic    Source of Stress Concerns Chronic Illness      Family Dynamics   Good Support System? Yes   fiance     Barriers   Psychosocial barriers to participate in program  There are no identifiable barriers or psychosocial needs.      Screening Interventions   Interventions Encouraged to exercise;Provide feedback about the scores to participant;To provide support and resources with identified psychosocial needs    Expected Outcomes Long Term Goal: Stressors or current issues are controlled or eliminated.;Short Term goal: Utilizing psychosocial counselor, staff and physician to assist with identification of specific Stressors or current issues interfering with healing process. Setting desired goal for each stressor or current issue identified.;Short Term goal: Identification and review with participant of any Quality of Life or Depression concerns found by scoring the questionnaire.;Long Term goal: The participant improves quality of Life and PHQ9 Scores as seen by post scores and/or verbalization of changes          Quality of Life Scores:   Quality of Life - 01/31/24 1514       Quality of Life   Select Quality of Life      Quality of Life Scores   Health/Function Pre 13.27 %    Socioeconomic Pre 17.88 %    Psych/Spiritual Pre 18.07 %    Family Pre 18.5 %    GLOBAL Pre 16.03 %         Scores of 19 and below usually indicate a poorer quality of life in these areas.  A difference of  2-3 points is a clinically meaningful difference.  A difference of 2-3 points in the total score of the Quality of Life Index has been associated with significant improvement in  overall quality of life, self-image, physical symptoms, and general health in studies assessing change in quality of life.  PHQ-9: Review Flowsheet  More data exists      03/02/2024 02/27/2024 01/31/2024 01/18/2024 11/29/2023  Depression screen PHQ 2/9  Decreased Interest 0 0 1 0 0  Down, Depressed, Hopeless 1 1 1  0 0  PHQ - 2 Score 1 1 2  0 0  Altered sleeping 1 2 1 1  -  Tired, decreased energy 1 1 2 2  -  Change in appetite 0 0 1 1 -  Feeling bad or failure about yourself  0 0 1 1 -  Trouble  concentrating 1 1 1 1  -  Moving slowly or fidgety/restless 0 0 0 0 -  Suicidal thoughts 0 0 0 0 -  PHQ-9 Score 4 5 8 6  -  Difficult doing work/chores Somewhat difficult Somewhat difficult Somewhat difficult Somewhat difficult -   Interpretation of Total Score  Total Score Depression Severity:  1-4 = Minimal depression, 5-9 = Mild depression, 10-14 = Moderate depression, 15-19 = Moderately severe depression, 20-27 = Severe depression   Psychosocial Evaluation and Intervention:  Psychosocial Evaluation - 01/27/24 1555       Psychosocial Evaluation & Interventions   Interventions Encouraged to exercise with the program and follow exercise prescription;Stress management education;Relaxation education    Comments Ms. 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 This came as a surprise to her as she had been quite active in physical therapy related to her autoimmune disease/chronic vertigo.  She states she is frustrated that this happened because she has taken care of her body and now she is having to reschedule appointments she had planned for months (like endoscopy, dentist, dermatology, etc). She has an undiagnosed autoimmune disease that doctors have tried to diagnose, but now are focusing on treating the symptoms. She mentions that in addition to her own stress, her fiance has been having to deal with all that happened with her MI and seeing her in an ambulance, having a vasovagal response post stent, and the hospital stay in general. They want to move forward but  there is some fear there as to what the new normal is going to be. She meets with a therapist as well. 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 She is interested in learning more about a healthy cardiac lifestyle and is committed to attending the program    Expected Outcomes Short: attend cardiac rehab for educaiton and exercise Long: develop and maintain positive self care habits    Continue  Psychosocial Services  Follow up required by staff          Psychosocial Re-Evaluation:  Psychosocial Re-Evaluation     Row Name 02/27/24 1700             Psychosocial Re-Evaluation   Current issues with Current Stress Concerns       Comments Ciarrah has a lot of stressors in her life. 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 This causes significant stress in her life as she takes care of him every day and she does not get along with him. 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 She tries to find time for herself when she can between work, appointments, and caretaking. She is  wanting to move forward with her life in the process of getting married to her fiance, but is overwhelmed and stressed from all of her responsibilities and recent health events to add wedding planning to the list. Her only support here is her fiance. She does see a psychotherapist weekly to help work on her stress concerns. She states she does not know how to deal with her stress as she just goes right back to work. We discussed ways to help reduce her stress daily. She practices diaphragmatic breathing her psychotherapist taught her and she is enjoying coming to rehab as it makes her stop and rest when exercising. We discussed these extra benefits regular exercise can give with dealing with stress. We reassessed her PHQ and her score went down to 5.       Expected Outcomes Short: Work on finding ways to reduce stress and cope with her multitude of stress throughout the day.  Long: Coninue to attend Cardiac Rehabilitiation for the benefits of exercise on stress.       Interventions Stress management education;Encouraged to attend Cardiac Rehabilitation for the exercise       Continue Psychosocial Services  Follow up required by staff          Psychosocial Discharge  (Final Psychosocial Re-Evaluation):  Psychosocial Re-Evaluation - 02/27/24 1700       Psychosocial Re-Evaluation   Current issues with Current Stress Concerns    Comments Latronda has a lot of stressors in her life. 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 This causes significant stress in her life as she takes care of him every day and she does not get along with him. 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 She tries to find time for herself when she can between work, appointments, and caretaking. She is wanting to move forward with her life in the process of getting married to her fiance, but is overwhelmed and stressed from all of her responsibilities and recent health events to add wedding planning to the list. Her only support here is her fiance. She does see a psychotherapist weekly to help work on her stress concerns. She states she does not know how to deal with her stress as she just goes right back to work. We discussed ways to help reduce her stress daily. She practices diaphragmatic breathing her psychotherapist taught her and she is enjoying coming to rehab as it makes her stop and rest when exercising. We discussed these extra benefits regular exercise can give with dealing with stress. We reassessed her PHQ and her score went down to 5.    Expected Outcomes Short: Work on finding ways to reduce stress and cope with her multitude of stress throughout the day.  Long: Coninue to attend Cardiac Rehabilitiation for the benefits of exercise on stress.    Interventions Stress management education;Encouraged to attend Cardiac Rehabilitation for the exercise    Continue Psychosocial Services  Follow up required by staff          Vocational Rehabilitation: Provide vocational rehab assistance to qualifying candidates.   Vocational Rehab  Evaluation & Intervention:  Vocational Rehab - 01/27/24 1528       Initial Vocational Rehab Evaluation & Intervention   Assessment shows need for Vocational Rehabilitation No  Education: Education Goals: Education classes will be provided on a variety of topics geared toward better understanding of heart health and risk factor modification. Participant will state understanding/return demonstration of topics presented as noted by education test scores.  Learning Barriers/Preferences:  Learning Barriers/Preferences - 01/27/24 1528       Learning Barriers/Preferences   Learning Barriers None    Learning Preferences None          General Cardiac Education Topics:  AED/CPR: - Group verbal and written instruction with the use of models to demonstrate the basic use of the AED with the basic ABC's of resuscitation.   Test and Procedures: - Group verbal and visual presentation and models provide information about basic cardiac anatomy and function. Reviews the testing methods done to diagnose heart disease and the outcomes of the test results. Describes the treatment choices: Medical Management, Angioplasty, or Coronary Bypass Surgery for treating various heart conditions including Myocardial Infarction, Angina, Valve Disease, and Cardiac Arrhythmias. Written material provided at class time.   Medication Safety: - Group verbal and visual instruction to review commonly prescribed medications for heart and lung disease. Reviews the medication, class of the drug, and side effects. Includes the steps to properly store meds and maintain the prescription regimen. Written material provided at class time. Flowsheet Row Cardiac Rehab from 03/14/2024 in [facility] Cardiac and Pulmonary Rehab  Education need identified 01/31/24    Intimacy: - Group verbal instruction through game format to discuss how heart and lung disease can affect sexual intimacy. Written material provided at class  time.   Know Your Numbers and Heart Failure: - Group verbal and visual instruction to discuss disease risk factors for cardiac and pulmonary disease and treatment options.  Reviews associated critical values for Overweight/Obesity, Hypertension, Cholesterol, and Diabetes.  Discusses basics of heart failure: signs/symptoms and treatments.  Introduces Heart Failure Zone chart for action plan for heart failure. Written material provided at class time.   Infection Prevention: - Provides verbal and written material to individual with discussion of infection control including proper hand washing and proper equipment cleaning during exercise session. Flowsheet Row Cardiac Rehab from 03/14/2024 in [facility] Cardiac and Pulmonary Rehab  Date 01/31/24  Educator [facility]  Instruction Review Code 1- Verbalizes Understanding    Falls Prevention: - Provides verbal and written material to individual with discussion of falls prevention and safety. Flowsheet Row Cardiac Rehab from 03/14/2024 in [facility] Cardiac and Pulmonary Rehab  Date 01/31/24  Educator [facility]  Instruction Review Code 1- Verbalizes Understanding    Other: -Provides group and verbal instruction on various topics (see comments)   Knowledge Questionnaire Score:  Knowledge Questionnaire Score - 01/31/24 1515       Knowledge Questionnaire Score   Pre Score 22/26          Core Components/Risk Factors/Patient Goals at Admission:  Personal Goals and Risk Factors at Admission - 01/27/24 1526       Core Components/Risk Factors/Patient Goals on Admission   Hypertension Yes    Intervention Provide education on lifestyle modifcations including regular physical activity/exercise, weight management, moderate sodium restriction and increased consumption of fresh fruit, vegetables, and low fat dairy, alcohol moderation, and smoking cessation.;Monitor prescription use compliance.    Expected Outcomes Short Term: Continued assessment and intervention until BP  is < 140/33mm HG in hypertensive participants. < 130/46mm HG in hypertensive participants with diabetes, heart failure or chronic kidney disease.;Long Term: Maintenance of blood pressure at goal levels.    Lipids Yes  Intervention Provide education and support for participant on nutrition & aerobic/resistive exercise along with prescribed medications to achieve LDL 70mg , HDL >40mg .    Expected Outcomes Short Term: Participant states understanding of desired cholesterol values and is compliant with medications prescribed. Participant is following exercise prescription and nutrition guidelines.;Long Term: Cholesterol controlled with medications as prescribed, with individualized exercise RX and with personalized nutrition plan. Value goals: LDL < 70mg , HDL > 40 mg.          Education:Diabetes - Individual verbal and written instruction to review signs/symptoms of diabetes, desired ranges of glucose level fasting, after meals and with exercise. Acknowledge that pre and post exercise glucose checks will be done for 3 sessions at entry of program.   Core Components/Risk Factors/Patient Goals Review:   Goals and Risk Factor Review     Row Name 02/27/24 1712             Core Components/Risk Factors/Patient Goals Review   Personal Goals Review Hypertension       Review Alese states she is not checking her blood pressure regularly and we discussed adding that into her morning routine and she plans to after a few minutes of rest in the morning after she checks her weight. She is taking her hypertension medication regularly and is very disciplined with all her medications.       Expected Outcomes Short: Start checking blood pressure in the morning. Long: Continue managing hypertension and working on a heart healthy lifestyle.          Core Components/Risk Factors/Patient Goals at Discharge (Final Review):   Goals and Risk Factor Review - 02/27/24 1712       Core Components/Risk Factors/Patient  Goals Review   Personal Goals Review Hypertension    Review Xzaria states she is not checking her blood pressure regularly and we discussed adding that into her morning routine and she plans to after a few minutes of rest in the morning after she checks her weight. She is taking her hypertension medication regularly and is very disciplined with all her medications.    Expected Outcomes Short: Start checking blood pressure in the morning. Long: Continue managing hypertension and working on a heart healthy lifestyle.          ITP Comments:  ITP Comments     Row Name 01/27/24 1541 01/31/24 1509 02/01/24 0749 02/20/24 1359 02/29/24 1156   ITP Comments Initial phone call completed. Diagnosis can be found in [facility] 8/2. EP Orientation scheduled for Tuesday 8/26 at 1:30. Completed and gym orientation for cardiac rehab. Initial ITP created and sent for review to Dr. Oneil Pinal, Medical Director. 30 Day review completed. Medical Director ITP review done, changes made as directed, and signed approval by Medical Director. New to Program. First full day of exercise!  Patient was oriented to gym and equipment including functions, settings, policies, and procedures.  Patient's individual exercise prescription and treatment plan were reviewed.  All starting workloads were established based on the results of the 6 minute walk test done at initial orientation visit.  The plan for exercise progression was also introduced and progression will be customized based on patient's performance and goals. 30 Day review completed. Medical Director ITP review done, changes made as directed, and signed approval by Medical Director. New to program    Row Name 03/28/24 0952           ITP Comments 30 Day review completed. Medical Director ITP review done, changes made as  directed, and signed approval by Medical Director.          Comments: 30 day review

## 2024-03-29 ENCOUNTER — Ambulatory Visit (INDEPENDENT_AMBULATORY_CARE_PROVIDER_SITE_OTHER): Admitting: Clinical

## 2024-03-29 ENCOUNTER — Ambulatory Visit: Admitting: Internal Medicine

## 2024-03-29 DIAGNOSIS — F4322 Adjustment disorder with anxiety: Secondary | ICD-10-CM
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
## 2024-04-02 ENCOUNTER — Encounter: Admitting: Emergency Medicine

## 2024-04-02 DIAGNOSIS — I214 Non-ST elevation (NSTEMI) myocardial infarction: Secondary | ICD-10-CM

## 2024-04-02 NOTE — Progress Notes (Signed)
 Daily Session Note  Patient Details  Name: Tracey Wiley MRN: [id number] Date of Birth: 05/26/1956 Referring Provider:   Flowsheet Row Cardiac Rehab from 01/31/2024 in [facility] Cardiac [facility]  Referring Provider Dr. Evalene Lunger    Encounter Date: 04/02/2024  Check In:  Session Check In - 04/02/24 1345       Check-In   Supervising physician immediately available to respond to emergencies See telemetry face sheet for immediately available ER MD    Location [facility]-Cardiac & Pulmonary Rehab    Staff Present Devaughn Jaeger, BS, Exercise Physiologist;Margaret Best, MS, Exercise Physiologist;Maxon Conetta BS, Exercise Physiologist;Ereka Brau RN,BSN    Virtual Visit No    Medication changes reported     No    Fall or balance concerns reported    No    Tobacco Cessation No Change    Warm-up and Cool-down Performed on first and last piece of equipment    Resistance Training Performed Yes    VAD Patient? No    PAD/SET Patient? No      Pain Assessment   Currently in Pain? No/denies             Social History   Tobacco Use  Smoking Status Never  Smokeless Tobacco Never    Goals Met:  Independence with exercise equipment Exercise tolerated well No report of concerns or symptoms today Strength training completed today  Goals Unmet:  Not Applicable  Comments: Pt able to follow exercise prescription today without complaint.  Will continue to monitor for progression.     Dr. Oneil Pinal is Medical Director for [facility] Cardiac Rehabilitation.  Dr. Fuad Aleskerov is Medical Director for [facility] Pulmonary Rehabilitation.

## 2024-04-03 ENCOUNTER — Ambulatory Visit
Admission: RE | Admit: 2024-04-03 | Discharge: 2024-04-03 | Disposition: A | Discharge status: Home / Self Care | Source: Ambulatory Visit | Attending: Gastroenterology | Admitting: Gastroenterology

## 2024-04-03 ENCOUNTER — Ambulatory Visit: Payer: Medicare HMO | Admitting: *Deleted

## 2024-04-03 VITALS — Ht 64.0 in | Wt 211.0 lb

## 2024-04-03 DIAGNOSIS — R1319 Other dysphagia: Secondary | ICD-10-CM | POA: Insufficient documentation

## 2024-04-03 DIAGNOSIS — Z0000 Encounter for general adult medical examination without abnormal findings: Secondary | ICD-10-CM

## 2024-04-03 NOTE — Patient Instructions (Signed)
 Ms. Nocera,  Thank you for taking the time for your Medicare Wellness Visit. I appreciate your continued commitment to your health goals. Please review the care plan we discussed, and feel free to reach out if I can assist you further.  Medicare recommends these wellness visits once per year to help you and your care team stay ahead of potential health issues. These visits are designed to focus on prevention, allowing your provider to concentrate on managing your acute and chronic conditions during your regular appointments.  Please note that Annual Wellness Visits do not include a physical exam. Some assessments may be limited, especially if the visit was conducted virtually. If needed, we may recommend a separate in-person follow-up with your provider.  Ongoing Care Seeing your primary care provider every 3 to 6 months helps us  monitor your health and provide consistent, personalized care.  Remember to update your flu and tetanus vaccines.   Referrals If a referral was made during today's visit and you haven't received any updates within two weeks, please contact the referred provider directly to check on the status.  Recommended Screenings:  Health Maintenance  Topic Date Due   Zoster (Shingles) Vaccine (1 of 2) Never done   DTaP/Tdap/Td vaccine (2 - Td or Tdap) 09/05/2017   COVID-19 Vaccine (5 - 2025-26 season) 02/06/2024   Breast Cancer Screening  04/19/2024   Colon Cancer Screening  04/25/2024   Flu Shot  09/04/2024*   Medicare Annual Wellness Visit  04/03/2025   Pneumococcal Vaccine for age over 37  Completed   DEXA scan (bone density measurement)  Completed   Hepatitis C Screening  Completed   Meningitis B Vaccine  Aged Out   Hepatitis B Vaccine  Discontinued  *Topic was postponed. The date shown is not the original due date.       04/03/2024    2:03 PM  Advanced Directives  Does Patient Have a Medical Advance Directive? Yes  Type of Estate Agent  of [place];Living will  Copy of Healthcare Power of Attorney in Chart? No - copy requested   Advance Care Planning is important because it: Ensures you receive medical care that aligns with your values, goals, and preferences. Provides guidance to your family and loved ones, reducing the emotional burden of decision-making during critical moments.  Vision: Annual vision screenings are recommended for early detection of glaucoma, cataracts, and diabetic retinopathy. These exams can also reveal signs of chronic conditions such as diabetes and high blood pressure.  Dental: Annual dental screenings help detect early signs of oral cancer, gum disease, and other conditions linked to overall health, including heart disease and diabetes.  Please see the attached documents for additional preventive care recommendations.
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
## 2024-04-04 ENCOUNTER — Encounter

## 2024-04-04 ENCOUNTER — Ambulatory Visit: Payer: Self-pay | Admitting: Gastroenterology

## 2024-04-05 ENCOUNTER — Ambulatory Visit (INDEPENDENT_AMBULATORY_CARE_PROVIDER_SITE_OTHER): Admitting: Clinical

## 2024-04-05 DIAGNOSIS — F4322 Adjustment disorder with anxiety: Secondary | ICD-10-CM
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
## 2024-04-09 ENCOUNTER — Encounter: Payer: Self-pay | Admitting: Radiology

## 2024-04-09 ENCOUNTER — Encounter: Payer: Self-pay | Admitting: Internal Medicine

## 2024-04-09 ENCOUNTER — Encounter: Attending: Cardiovascular Disease | Admitting: Emergency Medicine

## 2024-04-09 DIAGNOSIS — Z955 Presence of coronary angioplasty implant and graft: Secondary | ICD-10-CM | POA: Insufficient documentation

## 2024-04-09 DIAGNOSIS — I214 Non-ST elevation (NSTEMI) myocardial infarction: Secondary | ICD-10-CM | POA: Insufficient documentation

## 2024-04-09 NOTE — Progress Notes (Signed)
 Daily Session Note  Patient Details  Name: Tracey Wiley MRN: [id number] Date of Birth: August 26, 1955 Referring Provider:   Flowsheet Row Cardiac Rehab from 01/31/2024 in [facility] Cardiac and Pulmonary Rehab  Referring Provider Dr. Evalene Lunger    Encounter Date: 04/09/2024  Check In:  Session Check In - 04/09/24 1356       Check-In   Supervising physician immediately available to respond to emergencies See telemetry face sheet for immediately available ER MD    Location [facility]-Cardiac & Pulmonary Rehab    Staff Present Leita Franks RN,BSN;Maxon Burnell BS, Exercise Physiologist;Margaret Best, MS, Exercise Physiologist;Noah Tickle, BS, Exercise Physiologist    Virtual Visit No    Medication changes reported     No    Fall or balance concerns reported    No    Tobacco Cessation No Change    Warm-up and Cool-down Performed on first and last piece of equipment    Resistance Training Performed Yes    VAD Patient? No    PAD/SET Patient? No      Pain Assessment   Currently in Pain? No/denies             Social History   Tobacco Use  Smoking Status Never  Smokeless Tobacco Never    Goals Met:  Independence with exercise equipment Exercise tolerated well No report of concerns or symptoms today Strength training completed today  Goals Unmet:  Not Applicable  Comments: Pt able to follow exercise prescription today without complaint.  Will continue to monitor for progression.    Dr. Oneil Pinal is Medical Director for [facility] Cardiac Rehabilitation.  Dr. Fuad Aleskerov is Medical Director for [facility] Pulmonary Rehabilitation.

## 2024-04-09 NOTE — Telephone Encounter (Signed)
 Pt has not heard from the referral that was placed on 03/02/2024.

## 2024-04-10 ENCOUNTER — Encounter: Payer: Self-pay | Admitting: Pharmacist

## 2024-04-10 DIAGNOSIS — I2510 Atherosclerotic heart disease of native coronary artery without angina pectoris: Secondary | ICD-10-CM

## 2024-04-10 DIAGNOSIS — E782 Mixed hyperlipidemia: Secondary | ICD-10-CM

## 2024-04-10 NOTE — Telephone Encounter (Signed)
 If the symptoms come back again after either dose you decide to restart with then we will know that it is a side effect from the Repatha . If it turns out to be a side effect from the medication we can look at changing to a different medication or possibly doing a referral to lipid clinic for additional options.

## 2024-04-10 NOTE — Telephone Encounter (Signed)
 If she has already help two doses and the symptoms have persisted then it is likely not the Repatha . She can take the next dose and if she notes worsening symptoms then we can change her to Praluent or send her to lipid clinic for further options. So there is not an issue of worsening cholesterol that will impact her heart negatively, I would rather her not stay off her cholesterol regimen much longer.

## 2024-04-11 ENCOUNTER — Encounter: Admitting: Emergency Medicine

## 2024-04-11 DIAGNOSIS — Z955 Presence of coronary angioplasty implant and graft: Secondary | ICD-10-CM

## 2024-04-11 DIAGNOSIS — I214 Non-ST elevation (NSTEMI) myocardial infarction: Secondary | ICD-10-CM

## 2024-04-11 NOTE — Progress Notes (Signed)
 Daily Session Note  Patient Details  Name: Tracey Wiley MRN: [id number] Date of Birth: [birth year] Referring Provider:   Flowsheet Row Cardiac Rehab from 01/31/2024 in [facility] Cardiac and Pulmonary Rehab  Referring Provider Dr. Timothy Gollan    Encounter Date: 04/11/2024  Check In:  Session Check In - 04/11/24 1355       Check-In   Supervising physician immediately available to respond to emergencies See telemetry face sheet for immediately available ER MD    Location [facility]-Cardiac & Pulmonary Rehab    Staff Present Leita Franks RN,BSN;Noah Tickle, BS, Exercise Physiologist;Kelly Dyane HECKLE, ACSM CEP, Exercise Physiologist;Jason Elnor RDN,LDN    Virtual Visit No    Medication changes reported     No    Fall or balance concerns reported    No    Tobacco Cessation No Change    Warm-up and Cool-down Performed on first and last piece of equipment    Resistance Training Performed Yes    VAD Patient? No    PAD/SET Patient? No      Pain Assessment   Currently in Pain? No/denies             Social History   Tobacco Use  Smoking Status Never  Smokeless Tobacco Never    Goals Met:  Independence with exercise equipment Exercise tolerated well No report of concerns or symptoms today Strength training completed today  Goals Unmet:  Not Applicable  Comments: Pt able to follow exercise prescription today without complaint.  Will continue to monitor for progression.    Dr. Oneil Pinal is Medical Director for [facility] Cardiac Rehabilitation.  Dr. Fuad Aleskerov is Medical Director for [facility] Pulmonary Rehabilitation.

## 2024-04-12 ENCOUNTER — Ambulatory Visit (INDEPENDENT_AMBULATORY_CARE_PROVIDER_SITE_OTHER): Admitting: Clinical

## 2024-04-12 DIAGNOSIS — F4322 Adjustment disorder with anxiety: Secondary | ICD-10-CM | POA: Diagnosis not present
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
## 2024-04-16 ENCOUNTER — Other Ambulatory Visit: Payer: Self-pay | Admitting: Internal Medicine

## 2024-04-16 ENCOUNTER — Encounter: Admitting: Emergency Medicine

## 2024-04-16 DIAGNOSIS — Z803 Family history of malignant neoplasm of breast: Secondary | ICD-10-CM

## 2024-04-16 DIAGNOSIS — Z955 Presence of coronary angioplasty implant and graft: Secondary | ICD-10-CM

## 2024-04-16 DIAGNOSIS — I214 Non-ST elevation (NSTEMI) myocardial infarction: Secondary | ICD-10-CM

## 2024-04-16 NOTE — Progress Notes (Signed)
 Daily Session Note  Patient Details  Name: Tracey Wiley MRN: [id number] Date of Birth: 1956-03-17 Referring Provider:   Flowsheet Row Cardiac Rehab from 01/31/2024 in [facility] Cardiac and Pulmonary Rehab  Referring Provider Dr. Evalene Lunger    Encounter Date: 04/16/2024  Check In:  Session Check In - 04/16/24 1349       Check-In   Supervising physician immediately available to respond to emergencies See telemetry face sheet for immediately available ER MD    Location [facility]-Cardiac & Pulmonary Rehab    Staff Present Devaughn Jaeger, BS, Exercise Physiologist;Margaret Best, MS, Exercise Physiologist;Weronika Birch RN,BSN;Maxon Conetta BS, Exercise Physiologist    Virtual Visit No    Medication changes reported     No    Fall or balance concerns reported    No    Tobacco Cessation No Change    Warm-up and Cool-down Performed on first and last piece of equipment    Resistance Training Performed Yes    VAD Patient? No    PAD/SET Patient? No      Pain Assessment   Currently in Pain? No/denies             Social History   Tobacco Use  Smoking Status Never  Smokeless Tobacco Never    Goals Met:  Independence with exercise equipment Exercise tolerated well No report of concerns or symptoms today Strength training completed today  Goals Unmet:  Not Applicable  Comments: Pt able to follow exercise prescription today without complaint.  Will continue to monitor for progression.    Dr. Oneil Pinal is Medical Director for [facility] Cardiac Rehabilitation.  Dr. Fuad Aleskerov is Medical Director for [facility] Pulmonary Rehabilitation.

## 2024-04-16 NOTE — Telephone Encounter (Signed)
 Referral has been changed.

## 2024-04-18 ENCOUNTER — Encounter

## 2024-04-18 DIAGNOSIS — I214 Non-ST elevation (NSTEMI) myocardial infarction: Secondary | ICD-10-CM | POA: Diagnosis not present

## 2024-04-18 DIAGNOSIS — Z955 Presence of coronary angioplasty implant and graft: Secondary | ICD-10-CM

## 2024-04-18 NOTE — Progress Notes (Signed)
 Daily Session Note  Patient Details  Name: Tracey Wiley MRN: [id number] Date of Birth: 1955/08/15 Referring Provider:   Flowsheet Row Cardiac Rehab from 01/31/2024 in [facility] Cardiac and Pulmonary Rehab  Referring Provider Dr. Evalene Lunger    Encounter Date: 04/18/2024  Check In:  Session Check In - 04/18/24 1356       Check-In   Supervising physician immediately available to respond to emergencies See telemetry face sheet for immediately available ER MD    Location [facility]-Cardiac & Pulmonary Rehab    Staff Present Burnard Davenport RN,BSN,MPA;Laura Cates RN,BSN;Maeven Mcdougall Dyane BS, ACSM CEP, Exercise Physiologist;Noah Tickle, BS, Exercise Physiologist    Virtual Visit No    Medication changes reported     No    Fall or balance concerns reported    No    Tobacco Cessation No Change    Warm-up and Cool-down Performed on first and last piece of equipment    Resistance Training Performed Yes    VAD Patient? No    PAD/SET Patient? No      Pain Assessment   Currently in Pain? No/denies             Social History   Tobacco Use  Smoking Status Never  Smokeless Tobacco Never    Goals Met:  Independence with exercise equipment Exercise tolerated well No report of concerns or symptoms today Strength training completed today  Goals Unmet:  Not Applicable  Comments: Pt able to follow exercise prescription today without complaint.  Will continue to monitor for progression.    Dr. Oneil Pinal is Medical Director for [facility] Cardiac Rehabilitation.  Dr. Fuad Aleskerov is Medical Director for [facility] Pulmonary Rehabilitation.

## 2024-04-19 ENCOUNTER — Ambulatory Visit: Admitting: Clinical

## 2024-04-19 DIAGNOSIS — F4322 Adjustment disorder with anxiety: Secondary | ICD-10-CM | POA: Diagnosis not present
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
## 2024-04-23 ENCOUNTER — Encounter

## 2024-04-24 NOTE — Telephone Encounter (Signed)
 open in error

## 2024-04-25 ENCOUNTER — Encounter

## 2024-04-25 ENCOUNTER — Encounter: Payer: Self-pay | Admitting: *Deleted

## 2024-04-25 DIAGNOSIS — I214 Non-ST elevation (NSTEMI) myocardial infarction: Secondary | ICD-10-CM

## 2024-04-25 DIAGNOSIS — Z955 Presence of coronary angioplasty implant and graft: Secondary | ICD-10-CM

## 2024-04-25 NOTE — Progress Notes (Signed)
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 Short: Attend rehab more consistently. Long: Continue exercise to improve strength and stamina. Short: Add a day of at home exercise on fridays. Long: Continue exercise to improve strength and stamina. Short: Continue to progressively increase workloads. Long: Continue exercise to improve strength and stamina.        Discharge Exercise Prescription (Final Exercise Prescription Changes):  Exercise Prescription Changes - 04/24/24 1500       Response to Exercise   Blood Pressure (Admit) 110/62    Blood Pressure (Exercise) 126/66    Blood Pressure (Exit) 126/70    Heart Rate (Admit) 83 bpm    Heart Rate (Exercise) 99 bpm    Heart Rate (Exit) 74 bpm    Rating of Perceived Exertion (Exercise) 13    Symptoms none    Duration Continue with 30 min of aerobic exercise without signs/symptoms of physical distress.  Intensity THRR unchanged      Progression   Progression Continue to progress  workloads to maintain intensity without signs/symptoms of physical distress.    Average METs 2.21      Resistance Training   Training Prescription Yes    Weight 4 lb    Reps 10-15      Interval Training   Interval Training No      Recumbant Bike   Level 2    Watts 18    Minutes 15    METs 2.59      NuStep   Level 3    Minutes 15    METs 1.9      T5 Nustep   Level 2    Minutes 15    METs 1.9      Home Exercise Plan   Plans to continue exercise at [employer] (comment)   [employer]   Frequency Add 2 additional days to program exercise sessions.    Initial Home Exercises Provided 03/05/24      Oxygen   Maintain Oxygen Saturation 88% or higher          Nutrition:  Target Goals: Understanding of nutrition guidelines, daily intake of sodium 1500mg , cholesterol 200mg , calories 30% from fat and 7% or less from saturated fats, daily to have 5 or more servings of fruits and vegetables.  Education: Nutrition 1 -Group instruction provided by verbal, written material, interactive activities, discussions, models, and posters to present general guidelines for heart healthy nutrition including macronutrients, label reading, and promoting whole foods over processed counterparts. Education serves as pensions consultant of discussion of heart healthy eating for all. Written material provided at class time.    Education: Nutrition 2 -Group instruction provided by verbal, written material, interactive activities, discussions, models, and posters to present general guidelines for heart healthy nutrition including sodium, cholesterol, and saturated fat. Providing guidance of habit forming to improve blood pressure, cholesterol, and body weight. Written material provided at class time.     Biometrics:  Pre Biometrics - 01/31/24 1513       Pre Biometrics   Height 5' 4.8 (1.646 m)    Weight 215 lb 9.6 oz (97.8 kg)    Waist Circumference 45.5 inches    Hip  Circumference 48 inches    Waist to Hip Ratio 0.95 %    BMI (Calculated) 36.1    Single Leg Stand 30 seconds           Nutrition Therapy Plan and Nutrition Goals:  Nutrition Therapy & Goals - 03/12/24 1555       Nutrition Therapy   Diet Cardiac, Low Na    Protein (specify units) 80-90    Fiber 25 grams    Whole Grain Foods 3 servings    Saturated Fats 15 max. grams    Fruits and Vegetables 5 servings/day    Sodium 2 grams      Personal Nutrition Goals   Nutrition Goal Read labels and reduce sodium intake to below 2300mg . Ideally 1500mg  per day.    Personal Goal #2 Reduce saturated fat, less than 12g per day. Replace bad fats for more heart healthy fats.    Personal Goal #3 Eat 15-30gProtein and 30-60gCarbs at each meal.    Comments Tracey Wiley drinks mostly water . She eats breakfast late and usually skips lunch or will have a snack. She likes fruit as a snack. Also eating RX bars as snacks when on the go. Reviewed Rx bar labels and discussed  what makes a food heart healthy or not. Educated on carbs per meals to help manage blood sugars and control A1C. Encouraged her to pair carbs with protein and healthy fats. Provided Mediterranean diet handout. Educated on types of fats, sources, and how to read labels to find unmarked nutrients like healthy unsaturated fats. Provided guideline limits of less than 1500mg  sodium and less than 12g saturated fat.      Intervention Plan   Intervention Prescribe, educate and counsel regarding individualized specific dietary modifications aiming towards targeted core components such as weight, hypertension, lipid management, diabetes, heart failure and other comorbidities.;Nutrition handout(s) given to patient.    Expected Outcomes Long Term Goal: Adherence to prescribed nutrition plan.;Short Term Goal: A plan has been developed with personal nutrition goals set during dietitian appointment.;Short Term Goal: Understand basic principles of dietary content, such as  calories, fat, sodium, cholesterol and nutrients.          Nutrition Assessments:  MEDIFICTS Score Key: >=70 Need to make dietary changes  40-70 Heart Healthy Diet <= 40 Therapeutic Level Cholesterol Diet  Flowsheet Row Cardiac Rehab from 01/31/2024 in [facility] Card[facility] Re[facility]  Picture Your Plate Total Score on Admission 71   Picture Your Plate Scores: <59 Unhealthy dietary pattern with much room for improvement. 41-50 Dietary pattern unlikely to meet recommendations for good health and room for improvement. 51-60 More healthful dietary pattern, with some room for improvement.  >60 Healthy dietary pattern, although there may be some specific behaviors that could be improved.    Nutrition Goals Re-Evaluation:  Nutrition Goals Re-Evaluation     Row Name 04/16/24 1403             Goals   Comment Tracey Wiley continues to use the information provided by the RD to better read food labels. She eats takeout about 3 times a week, but tries to watch her sodium intake.       Expected Outcome Short: Continue to read food labels, especially with takeout. Long: Continue focus on cooking at home and limiting takeout food.          Nutrition Goals Discharge (Final Nutrition Goals Re-Evaluation):  Nutrition Goals Re-Evaluation - 04/16/24 1403       Goals   Comment Tracey Wiley continues to use the information provided by the RD to better read food labels. She eats takeout about 3 times a week, but tries to watch her sodium intake.    Expected Outcome Short: Continue to read food labels, especially with takeout. Long: Continue focus on cooking at home and limiting takeout food.          Psychosocial: Target Goals: Acknowledge presence or absence of significant depression and/or stress, maximize coping skills, provide positive support system. Participant is able to verbalize types and ability to use techniques and skills needed for reducing stress and depression.   Education: Stress, Anxiety,  and Depression - Group verbal and visual presentation to define topics covered.  Reviews how body is impacted by stress, anxiety, and depression.  Also discusses healthy ways to reduce stress and to treat/manage anxiety and depression. Written material provided at class time.   Education: Sleep Hygiene -Provides group verbal and written instruction about how sleep can affect your health.  Define sleep hygiene, discuss sleep cycles and impact of sleep habits. Review good sleep hygiene tips.   Initial Review & Psychosocial Screening:  Initial Psych Review & Screening - 01/27/24 1528       Initial Review   Current  issues with Current Stress Concerns;Current Anxiety/Panic    Source of Stress Concerns Chronic Illness      Family Dynamics   Good Support System? Yes   fiance     Barriers   Psychosocial barriers to participate in program There are no identifiable barriers or psychosocial needs.      Screening Interventions   Interventions Encouraged to exercise;Provide feedback about the scores to participant;To provide support and resources with identified psychosocial needs    Expected Outcomes Long Term Goal: Stressors or current issues are controlled or eliminated.;Short Term goal: Utilizing psychosocial counselor, staff and physician to assist with identification of specific Stressors or current issues interfering with healing process. Setting desired goal for each stressor or current issue identified.;Short Term goal: Identification and review with participant of any Quality of Life or Depression concerns found by scoring the questionnaire.;Long Term goal: The participant improves quality of Life and PHQ9 Scores as seen by post scores and/or verbalization of changes          Quality of Life Scores:   Quality of Life - 01/31/24 1514       Quality of Life   Select Quality of Life      Quality of Life Scores   Health/Function Pre 13.27 %    Socioeconomic Pre 17.88 %     Psych/Spiritual Pre 18.07 %    Family Pre 18.5 %    GLOBAL Pre 16.03 %         Scores of 19 and below usually indicate a poorer quality of life in these areas.  A difference of  2-3 points is a clinically meaningful difference.  A difference of 2-3 points in the total score of the Quality of Life Index has been associated with significant improvement in overall quality of life, self-image, physical symptoms, and general health in studies assessing change in quality of life.  PHQ-9: Review Flowsheet  More data exists      04/16/2024 04/03/2024 03/02/2024 02/27/2024 01/31/2024  Depression screen PHQ 2/9  Decreased Interest 1 0 0 0 1  Down, Depressed, Hopeless 1 1 1 1 1   PHQ - 2 Score 2 1 1 1 2   Altered sleeping 0 1 1 2 1   Tired, decreased energy 0 2 1 1 2   Change in appetite 0 0 0 0 1  Feeling bad or failure about yourself  0 0 0 0 1  Trouble concentrating 1 0 1 1 1   Moving slowly or fidgety/restless 0 0 0 0 0  Suicidal thoughts 0 0 0 0 0  PHQ-9 Score 3 4  4  5  8    Difficult doing work/chores Somewhat difficult Somewhat difficult Somewhat difficult Somewhat difficult Somewhat difficult    Details       Data saved with a previous flowsheet row definition        Interpretation of Total Score  Total Score Depression Severity:  1-4 = Minimal depression, 5-9 = Mild depression, 10-14 = Moderate depression, 15-19 = Moderately severe depression, 20-27 = Severe depression   Psychosocial Evaluation and Intervention:  Psychosocial Evaluation - 01/27/24 1555       Psychosocial Evaluation & Interventions   Interventions Encouraged to exercise with the program and follow exercise prescription;Stress management education;Relaxation education    Comments 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 This came as a surprise to her as she had been quite active in physical therapy related to her autoimmune disease/chronic vertigo.  She states she is  frustrated that this happened  because she has taken care of her body and now she is having to reschedule appointments she had planned for months (like endoscopy, dentist, dermatology, etc). She has an undiagnosed autoimmune disease that doctors have tried to diagnose, but now are focusing on treating the symptoms. She mentions that in addition to her own stress, her fiance has been having to deal with all that happened with her MI and seeing her in an ambulance, having a vasovagal response post stent, and the hospital stay in general. They want to move forward but  there is some fear there as to what the new normal is going to be. She meets with a therapist as well. 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 She is interested in learning more about a healthy cardiac lifestyle and is committed to attending the program    Expected Outcomes Short: attend cardiac rehab for educaiton and exercise Long: develop and maintain positive self care habits    Continue Psychosocial Services  Follow up required by staff          Psychosocial Re-Evaluation:  Psychosocial Re-Evaluation     Row Name 02/27/24 1700 04/16/24 1353           Psychosocial Re-Evaluation   Current issues with Current Stress Concerns Current Stress Concerns      Comments Tracey Wiley has a lot of stressors in her life. 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 This causes significant stress in her life as she takes care of him every day and she does not get along with him. 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 She tries to find time for herself when she can between work, appointments, and caretaking. She is wanting to move forward with her life in the process of getting married to her fiance, but is overwhelmed and stressed from all of her responsibilities and recent health events  to add wedding planning to the list. Her only support here is her fiance. She does see a psychotherapist weekly to help work on her stress concerns. She states she does not know how to deal with her stress as she just goes right back to work. We discussed ways to help reduce her stress daily. She practices diaphragmatic breathing her psychotherapist taught her and she is enjoying coming to rehab as it makes her stop and rest when exercising. We discussed these extra benefits regular exercise can give with dealing with stress. We reassessed her PHQ and her score went down to 5. Tracey Wiley is still dealing with some stress. She continues to be the primary caregiver to her ex-husband, and that continues to be the main source of her stress. She feels stretched thin and states that his needs continue to grow, and time for herself becomes less. She has a great support system made up of her husband, and her 2 counselors that she sees once a week, and she continues to exercise in the program and outside for stress relief. Tracey Wiley states that she has no concerns with her sleep, she does state that she wakes up with some occasional arthritic pain, but is able to go back to sleep.      Expected Outcomes Short: Work on finding ways to reduce stress and cope with her multitude of  stress throughout the day.  Long: Coninue to attend Cardiac Rehabilitiation for the benefits of exercise on stress. Short: Continue to lean on support system to reduce stress, add exercise days to focus on herself. Long: Continue to attend cardiac rehabilitation for the benefits in stress relief.      Interventions Stress management education;Encouraged to attend Cardiac Rehabilitation for the exercise Encouraged to attend Cardiac Rehabilitation for the exercise      Continue Psychosocial Services  Follow up required by staff Follow up required by staff         Psychosocial Discharge (Final Psychosocial Re-Evaluation):  Psychosocial Re-Evaluation -  04/16/24 1353       Psychosocial Re-Evaluation   Current issues with Current Stress Concerns    Comments Tracey Wiley is still dealing with some stress. She continues to be the primary caregiver to her ex-husband, and that continues to be the main source of her stress. She feels stretched thin and states that his needs continue to grow, and time for herself becomes less. She has a great support system made up of her husband, and her 2 counselors that she sees once a week, and she continues to exercise in the program and outside for stress relief. Tracey Wiley states that she has no concerns with her sleep, she does state that she wakes up with some occasional arthritic pain, but is able to go back to sleep.    Expected Outcomes Short: Continue to lean on support system to reduce stress, add exercise days to focus on herself. Long: Continue to attend cardiac rehabilitation for the benefits in stress relief.    Interventions Encouraged to attend Cardiac Rehabilitation for the exercise    Continue Psychosocial Services  Follow up required by staff          Vocational Rehabilitation: Provide vocational rehab assistance to qualifying candidates.   Vocational Rehab Evaluation & Intervention:  Vocational Rehab - 01/27/24 1528       Initial Vocational Rehab Evaluation & Intervention   Assessment shows need for Vocational Rehabilitation No          Education: Education Goals: Education classes will be provided on a variety of topics geared toward better understanding of heart health and risk factor modification. Participant will state understanding/return demonstration of topics presented as noted by education test scores.  Learning Barriers/Preferences:  Learning Barriers/Preferences - 01/27/24 1528       Learning Barriers/Preferences   Learning Barriers None    Learning Preferences None          General Cardiac Education Topics:  AED/CPR: - Group verbal and written instruction with the use of  models to demonstrate the basic use of the AED with the basic ABC's of resuscitation.   Test and Procedures: - Group verbal and visual presentation and models provide information about basic cardiac anatomy and function. Reviews the testing methods done to diagnose heart disease and the outcomes of the test results. Describes the treatment choices: Medical Management, Angioplasty, or Coronary Bypass Surgery for treating various heart conditions including Myocardial Infarction, Angina, Valve Disease, and Cardiac Arrhythmias. Written material provided at class time. Flowsheet Row Cardiac Rehab from 04/11/2024 in [facility] Cardiac and Pulmonary Rehab  Date 04/11/24  Educator lc  Instruction Review Code 1- Verbalizes Understanding    Medication Safety: - Group verbal and visual instruction to review commonly prescribed medications for heart and lung disease. Reviews the medication, class of the drug, and side effects. Includes the steps to properly store meds and maintain the  prescription regimen. Written material provided at class time. Flowsheet Row Cardiac Rehab from 04/11/2024 in [facility] Cardiac and Pulmonary Rehab  Education need identified 01/31/24    Intimacy: - Group verbal instruction through game format to discuss how heart and lung disease can affect sexual intimacy. Written material provided at class time.   Know Your Numbers and Heart Failure: - Group verbal and visual instruction to discuss disease risk factors for cardiac and pulmonary disease and treatment options.  Reviews associated critical values for Overweight/Obesity, Hypertension, Cholesterol, and Diabetes.  Discusses basics of heart failure: signs/symptoms and treatments.  Introduces Heart Failure Zone chart for action plan for heart failure. Written material provided at class time.   Infection Prevention: - Provides verbal and written material to individual with discussion of infection control including proper hand washing and  proper equipment cleaning during exercise session. Flowsheet Row Cardiac Rehab from 04/11/2024 in [facility] Cardiac and Pulmonary Rehab  Date 01/31/24  Educator [facility]  Instruction Review Code 1- Verbalizes Understanding    Falls Prevention: - Provides verbal and written material to individual with discussion of falls prevention and safety. Flowsheet Row Cardiac Rehab from 04/11/2024 in [facility] Cardiac and Pulmonary Rehab  Date 01/31/24  Educator [facility]  Instruction Review Code 1- Verbalizes Understanding    Other: -Provides group and verbal instruction on various topics (see comments)   Knowledge Questionnaire Score:  Knowledge Questionnaire Score - 01/31/24 1515       Knowledge Questionnaire Score   Pre Score 22/26          Core Components/Risk Factors/Patient Goals at Admission:  Personal Goals and Risk Factors at Admission - 01/27/24 1526       Core Components/Risk Factors/Patient Goals on Admission   Hypertension Yes    Intervention Provide education on lifestyle modifcations including regular physical activity/exercise, weight management, moderate sodium restriction and increased consumption of fresh fruit, vegetables, and low fat dairy, alcohol moderation, and smoking cessation.;Monitor prescription use compliance.    Expected Outcomes Short Term: Continued assessment and intervention until BP is < 140/10mm HG in hypertensive participants. < 130/44mm HG in hypertensive participants with diabetes, heart failure or chronic kidney disease.;Long Term: Maintenance of blood pressure at goal levels.    Lipids Yes    Intervention Provide education and support for participant on nutrition & aerobic/resistive exercise along with prescribed medications to achieve LDL 70mg , HDL >40mg .    Expected Outcomes Short Term: Participant states understanding of desired cholesterol values and is compliant with medications prescribed. Participant is following exercise prescription and nutrition  guidelines.;Long Term: Cholesterol controlled with medications as prescribed, with individualized exercise RX and with personalized nutrition plan. Value goals: LDL < 70mg , HDL > 40 mg.          Education:Diabetes - Individual verbal and written instruction to review signs/symptoms of diabetes, desired ranges of glucose level fasting, after meals and with exercise. Acknowledge that pre and post exercise glucose checks will be done for 3 sessions at entry of program.   Core Components/Risk Factors/Patient Goals Review:   Goals and Risk Factor Review     Row Name 02/27/24 1712 04/16/24 1411           Core Components/Risk Factors/Patient Goals Review   Personal Goals Review Hypertension Hypertension      Review Tracey Wiley states she is not checking her blood pressure regularly and we discussed adding that into her morning routine and she plans to after a few minutes of rest in the morning after she checks her  weight. She is taking her hypertension medication regularly and is very disciplined with all her medications. Caylei is not taking her BP at home. Discussed the importance of regular BP checks at home in addition to days she attends the program. She states that she is taking her medication regularly, but still states that her BP fluctuates.      Expected Outcomes Short: Start checking blood pressure in the morning. Long: Continue managing hypertension and working on a heart healthy lifestyle. Short: Start checking BP every other day, work towards every day. Long: Continue to manage hypertension with medication and a heart healthy lifestyle.         Core Components/Risk Factors/Patient Goals at Discharge (Final Review):   Goals and Risk Factor Review - 04/16/24 1411       Core Components/Risk Factors/Patient Goals Review   Personal Goals Review Hypertension    Review Tracey Wiley is not taking her BP at home. Discussed the importance of regular BP checks at home in addition to days she attends the  program. She states that she is taking her medication regularly, but still states that her BP fluctuates.    Expected Outcomes Short: Start checking BP every other day, work towards every day. Long: Continue to manage hypertension with medication and a heart healthy lifestyle.          ITP Comments:  ITP Comments     Row Name 01/27/24 1541 01/31/24 1509 02/01/24 0749 02/20/24 1359 02/29/24 1156   ITP Comments Initial phone call completed. Diagnosis can be found in [facility] 8/2. EP Orientation scheduled for Tuesday 8/26 at 1:30. Completed and gym orientation for cardiac rehab. Initial ITP created and sent for review to Dr. Oneil Pinal, Medical Director. 30 Day review completed. Medical Director ITP review done, changes made as directed, and signed approval by Medical Director. New to Program. First full day of exercise!  Patient was oriented to gym and equipment including functions, settings, policies, and procedures.  Patient's individual exercise prescription and treatment plan were reviewed.  All starting workloads were established based on the results of the 6 minute walk test done at initial orientation visit.  The plan for exercise progression was also introduced and progression will be customized based on patient's performance and goals. 30 Day review completed. Medical Director ITP review done, changes made as directed, and signed approval by Medical Director. New to program    Row Name 03/28/24 0952 04/25/24 1136         ITP Comments 30 Day review completed. Medical Director ITP review done, changes made as directed, and signed approval by Medical Director. 30 Day review completed. Medical Director ITP review done, changes made as directed, and signed approval by Medical Director.         Comments: 30 Day Review ITP

## 2024-04-26 ENCOUNTER — Ambulatory Visit: Admitting: Clinical

## 2024-04-26 ENCOUNTER — Other Ambulatory Visit: Payer: Self-pay | Admitting: Cardiology

## 2024-04-26 DIAGNOSIS — F4322 Adjustment disorder with anxiety: Secondary | ICD-10-CM
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
## 2024-04-30 ENCOUNTER — Encounter: Admitting: Emergency Medicine

## 2024-04-30 DIAGNOSIS — Z955 Presence of coronary angioplasty implant and graft: Secondary | ICD-10-CM

## 2024-04-30 DIAGNOSIS — I214 Non-ST elevation (NSTEMI) myocardial infarction: Secondary | ICD-10-CM | POA: Diagnosis not present

## 2024-04-30 NOTE — Progress Notes (Signed)
 Daily Session Note  Patient Details  Name: Tracey Wiley MRN: [id number] Date of Birth: 11/24/1955 Referring Provider:   Flowsheet Row Cardiac Rehab from 01/31/2024 in [facility] Cardiac and Pulmonary Rehab  Referring Provider Dr. Evalene Lunger    Encounter Date: 04/30/2024  Check In:  Session Check In - 04/30/24 1342       Check-In   Supervising physician immediately available to respond to emergencies See telemetry face sheet for immediately available ER MD    Location [facility]-Cardiac & Pulmonary Rehab    Staff Present Leita Franks RN,BSN;Maxon Burnell BS, Exercise Physiologist;Margaret Best, MS, Exercise Physiologist;Noah Tickle, BS, Exercise Physiologist    Virtual Visit No    Medication changes reported     No    Fall or balance concerns reported    No    Tobacco Cessation No Change    Warm-up and Cool-down Performed on first and last piece of equipment    Resistance Training Performed Yes    VAD Patient? No    PAD/SET Patient? No      Pain Assessment   Currently in Pain? No/denies             Social History   Tobacco Use  Smoking Status Never  Smokeless Tobacco Never    Goals Met:  Independence with exercise equipment Exercise tolerated well No report of concerns or symptoms today Strength training completed today  Goals Unmet:  Not Applicable  Comments: Pt able to follow exercise prescription today without complaint.  Will continue to monitor for progression.    Dr. Oneil Pinal is Medical Director for [facility] Cardiac Rehabilitation.  Dr. Fuad Aleskerov is Medical Director for [facility] Pulmonary Rehabilitation.

## 2024-05-02 ENCOUNTER — Encounter: Admitting: Emergency Medicine

## 2024-05-02 DIAGNOSIS — I214 Non-ST elevation (NSTEMI) myocardial infarction: Secondary | ICD-10-CM | POA: Diagnosis not present

## 2024-05-02 DIAGNOSIS — Z955 Presence of coronary angioplasty implant and graft: Secondary | ICD-10-CM

## 2024-05-02 NOTE — Progress Notes (Signed)
 Daily Session Note  Patient Details  Name: Tracey Wiley MRN: [id number] Date of Birth: 1956-03-03 Referring Provider:   Flowsheet Row Cardiac Rehab from 01/31/2024 in [facility] Cardiac [facility] Rehab  Referring Provider Dr. Evalene Lunger    Encounter Date: 05/02/2024  Check In:  Session Check In - 05/02/24 1357       Check-In   Supervising physician immediately available to respond to emergencies See telemetry face sheet for immediately available ER MD    Location [facility]-Cardiac & Pulmonary Rehab    Staff Present Leita Franks RN,BSN;Noah Tickle, BS, Exercise Physiologist;Margaret Best, MS, Exercise Physiologist;Meredith Tressa RN,BSN    Virtual Visit No    Medication changes reported     No    Fall or balance concerns reported    No    Tobacco Cessation No Change    Warm-up and Cool-down Performed on first and last piece of equipment    Resistance Training Performed Yes    VAD Patient? No    PAD/SET Patient? No      Pain Assessment   Currently in Pain? No/denies             Social History   Tobacco Use  Smoking Status Never  Smokeless Tobacco Never    Goals Met:  Independence with exercise equipment Exercise tolerated well No report of concerns or symptoms today Strength training completed today  Goals Unmet:  Not Applicable  Comments: Pt able to follow exercise prescription today without complaint.  Will continue to monitor for progression.    Dr. Oneil Pinal is Medical Director for [facility] Cardiac Rehabilitation.  Dr. Fuad Aleskerov is Medical Director for [facility] Pulmonary Rehabilitation.

## 2024-05-05 ENCOUNTER — Other Ambulatory Visit: Payer: Self-pay | Admitting: Cardiovascular Disease

## 2024-05-07 ENCOUNTER — Encounter: Attending: Cardiovascular Disease | Admitting: Emergency Medicine

## 2024-05-07 DIAGNOSIS — Z955 Presence of coronary angioplasty implant and graft: Secondary | ICD-10-CM | POA: Insufficient documentation

## 2024-05-07 DIAGNOSIS — I214 Non-ST elevation (NSTEMI) myocardial infarction: Secondary | ICD-10-CM | POA: Insufficient documentation

## 2024-05-07 NOTE — Progress Notes (Signed)
 Daily Session Note  Patient Details  Name: Tracey Wiley MRN: [id number] Date of Birth: 1955-06-11 Referring Provider:   Flowsheet Row Cardiac Rehab from 01/31/2024 in [facility] Cardiac and Pulmonary Rehab  Referring Provider Dr. Evalene Lunger    Encounter Date: 05/07/2024  Check In:  Session Check In - 05/07/24 1409       Check-In   Supervising physician immediately available to respond to emergencies See telemetry face sheet for immediately available ER MD    Location [facility]-Cardiac & Pulmonary Rehab    Staff Present Leita Franks RN,BSN;Maxon Burnell BS, Exercise Physiologist;Margaret Best, MS, Exercise Physiologist;Noah Tickle, BS, Exercise Physiologist    Virtual Visit No    Medication changes reported     No    Fall or balance concerns reported    No    Tobacco Cessation No Change    Warm-up and Cool-down Performed on first and last piece of equipment    Resistance Training Performed Yes    VAD Patient? No    PAD/SET Patient? No      Pain Assessment   Currently in Pain? No/denies             Social History   Tobacco Use  Smoking Status Never  Smokeless Tobacco Never    Goals Met:  Independence with exercise equipment Exercise tolerated well No report of concerns or symptoms today Strength training completed today  Goals Unmet:  Not Applicable  Comments: Pt able to follow exercise prescription today without complaint.  Will continue to monitor for progression.    Dr. Oneil Pinal is Medical Director for [facility] Cardiac Rehabilitation.  Dr. Fuad Aleskerov is Medical Director for [facility] Pulmonary Rehabilitation.

## 2024-05-09 ENCOUNTER — Encounter

## 2024-05-09 DIAGNOSIS — Z955 Presence of coronary angioplasty implant and graft: Secondary | ICD-10-CM

## 2024-05-09 DIAGNOSIS — I214 Non-ST elevation (NSTEMI) myocardial infarction: Secondary | ICD-10-CM | POA: Diagnosis not present

## 2024-05-09 NOTE — Progress Notes (Signed)
 Daily Session Note  Patient Details  Name: Tracey Wiley MRN: [id number] Date of Birth: June 07, 1956 Referring Provider:   Flowsheet Row Cardiac Rehab from 01/31/2024 in [facility] Cardiac and Pulmonary Rehab  Referring Provider Dr. Timothy Gollan    Encounter Date: 05/09/2024  Check In:  Session Check In - 05/09/24 1354       Check-In   Supervising physician immediately available to respond to emergencies See telemetry face sheet for immediately available ER MD    Location [facility]-Cardiac & Pulmonary Rehab    Staff Present Burnard Davenport RN,BSN,MPA;Laura Cates RN,BSN;Coco Sharpnack Dyane BS, ACSM CEP, Exercise Physiologist;Noah Tickle, BS, Exercise Physiologist    Virtual Visit No    Medication changes reported     No    Fall or balance concerns reported    No    Tobacco Cessation No Change    Warm-up and Cool-down Performed on first and last piece of equipment    Resistance Training Performed Yes    VAD Patient? No    PAD/SET Patient? No      Pain Assessment   Currently in Pain? No/denies             Social History   Tobacco Use  Smoking Status Never  Smokeless Tobacco Never    Goals Met:  Independence with exercise equipment Exercise tolerated well Personal goals reviewed No report of concerns or symptoms today Strength training completed today  Goals Unmet:  Not Applicable  Comments: Pt able to follow exercise prescription today without complaint.  Will continue to monitor for progression.    Dr. Oneil Pinal is Medical Director for [facility] Cardiac Rehabilitation.  Dr. Fuad Aleskerov is Medical Director for [facility] Pulmonary Rehabilitation.

## 2024-05-10 ENCOUNTER — Ambulatory Visit: Admitting: Clinical

## 2024-05-10 DIAGNOSIS — F4322 Adjustment disorder with anxiety: Secondary | ICD-10-CM
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
## 2024-05-14 ENCOUNTER — Encounter

## 2024-05-15 ENCOUNTER — Encounter: Payer: Self-pay | Admitting: Internal Medicine

## 2024-05-15 ENCOUNTER — Ambulatory Visit: Admitting: Internal Medicine

## 2024-05-15 VITALS — BP 150/76 | HR 73 | Ht 64.0 in | Wt 214.8 lb

## 2024-05-15 DIAGNOSIS — Z23 Encounter for immunization: Secondary | ICD-10-CM

## 2024-05-15 DIAGNOSIS — M6281 Muscle weakness (generalized): Secondary | ICD-10-CM

## 2024-05-15 DIAGNOSIS — M791 Myalgia, unspecified site: Secondary | ICD-10-CM

## 2024-05-15 NOTE — Patient Instructions (Signed)
  Referrals to  Dr Maree [facility],  and [facility] physical therapy are in progress .  If you are not contacted  by the end of December ,  check your mychart.  The referral coordinators will send you a message if they can't reach you by phone.

## 2024-05-15 NOTE — Progress Notes (Unsigned)
 Subjective:  Patient ID: Tracey Wiley, female    DOB: 13-Oct-1955  Age: 68 y.o. MRN: [id number]  CC: There were no encounter diagnoses.   HPI Tracey Wiley presents for  Chief Complaint  Patient presents with   Medical Management of Chronic Issues   FOT THE LAST 6 WEEKS INTERMITTENT  MODERATE PAIN INVOLVING THE MUSCLES OF ARMS AND LEGS  . OCCURRING SPORADICALLY,  WAKING UP BY SPECIFIC PAIN IN VARIOUS BODY PARTS,  ONE AT A TIME,  LASTS 5 MINTUES,  VERY PAINFUL.  NOT A MUSCLE CRAMP . Descrbed as deep and penetrating hurts initially to touch the affected  are but less palpable after one minutes .  The pain does not  completely resolve unless she massages the area without rubbing it. The pain does not involve the torso  THE JOINTS   NO NEW ACTIVITIES:  DOING CARDIAC REHAB STARTED IN SEPTEMBER  20  MINUTES ON BIKE 20  MINUTES ON THE ON NU STEP 20 TRACK 10 MIN STRETCHING,  10 MINUTES USING 4 LBS WEIGHT .  PAIN DOES NOT OCCUR DURING REHAB OR AFTER  MOST OCCURRING IN BED  She lacks the strength in lower proximal muscles to get up out of the tub , even from a kneeing position.  . This proximal muscle weakness started after her heart attack  .  Has not taken repatha  since early November due to a prolonged respiratRY issue that lasted for most of October.  Does not take a statin  Was seen by another clinic in October for URI. Symptoms started after 3rd dose of Repatha .  Chest  xray normal.  Augmentin  prescribed.  Did not improve so Repatha  was suspended.  Resumed repatha  9 days ago.     Outpatient Medications Prior to Visit  Medication Sig Dispense Refill   acetaminophen  (TYLENOL ) 500 MG tablet Take 500 mg by mouth daily as needed for pain.     aspirin  EC 81 MG tablet Take 81 mg by mouth daily.     cholecalciferol (VITAMIN D3) 25 MCG (1000 UNIT) tablet Take 1,000 Units by mouth daily.     colchicine  0.6 MG tablet TAKE 1 TABLET (0.6 MG TOTAL) BY MOUTH 2 (TWO) TIMES DAILY AS NEEDED. 180 tablet 0    cyanocobalamin (VITAMIN B12) 1000 MCG tablet Take 1,000 mcg by mouth daily.     esomeprazole  (NEXIUM ) 40 MG capsule TAKE 1 CAPSULE (40 MG TOTAL) BY MOUTH DAILY. 90 capsule 1   Evolocumab  (REPATHA  SURECLICK) 140 MG/ML SOAJ INJECT 140 MG INTO THE SKIN EVERY 14 (FOURTEEN) DAYS. 6 mL 3   ezetimibe  (ZETIA ) 10 MG tablet Take 1 tablet (10 mg total) by mouth daily. 90 tablet 3   famotidine  (PEPCID ) 20 MG tablet TAKE 1 TABLET BY MOUTH EVERYDAY AT BEDTIME 90 tablet 0   Fexofenadine HCl (ALLEGRA PO) Take 1 tablet by mouth every morning.     Flaxseed, Linseed, (FLAX SEED OIL) 1000 MG CAPS Take 1 capsule by mouth at bedtime.     furosemide  (LASIX ) 20 MG tablet TAKE 1 TABLET BY MOUTH EVERY DAY AS NEEDED (Patient taking differently: Take 20 mg by mouth as needed.) 90 tablet 3   ipratropium (ATROVENT ) 0.06 % nasal spray USE 1 SPRAY IN EACH NOSTRIL 1-2 TIMES DAILY AS NEEDED TO CONTROL DRAINAGE 45 mL 0   levalbuterol  (XOPENEX  HFA) 45 MCG/ACT inhaler Inhale 2 puffs into the lungs every 4 (four) hours as needed for wheezing. 15 g 1   magnesium  gluconate (MAGONATE) 500  MG tablet Take 500 mg by mouth at bedtime.     MELATONIN ER PO Take 6 mg by mouth at bedtime.     metoprolol  tartrate (LOPRESSOR ) 25 MG tablet Take 1 tablet (25 mg total) by mouth 2 (two) times daily. 60 tablet 11   nitroGLYCERIN  (NITROSTAT ) 0.4 MG SL tablet Place 1 tablet (0.4 mg total) under the tongue every 5 (five) minutes as needed for chest pain. 50 tablet 3   Omega-3 Fatty Acids (OMEGA-3 FISH OIL PO) Take 1 capsule by mouth daily.     ondansetron  (ZOFRAN  ODT) 4 MG disintegrating tablet Take 1 tablet (4 mg total) by mouth every 8 (eight) hours as needed for nausea or vomiting. 20 tablet 0   potassium chloride  (KLOR-CON ) 10 MEQ tablet TAKE 1 TABLET (10 MEQ TOTAL) BY MOUTH DAILY AS NEEDED. (Patient taking differently: Take 10 mEq by mouth as needed.) 90 tablet 3   prasugrel  (EFFIENT ) 10 MG TABS tablet TAKE 1 TABLET BY MOUTH EVERY DAY 30 tablet 2    promethazine  (PHENERGAN ) 12.5 MG tablet Take 12.5 mg by mouth every 6 (six) hours as needed for nausea or vomiting.     sucralfate  (CARAFATE ) 1 g tablet Take 1 tablet (1 g total) by mouth 2 (two) times daily. Place 1 tablet in 10 ml of warm water  and drink twice daily as needed. 60 tablet 2   valsartan  (DIOVAN ) 40 MG tablet Take 1 tablet (40 mg total) by mouth daily. 30 tablet 11   vitamin C (ASCORBIC ACID) 500 MG tablet Take 1,000 mg by mouth daily.     zinc  gluconate 50 MG tablet Take 50 mg by mouth daily.     zinc  oxide (MEIJER ZINC  OXIDE) 20 % ointment Apply 1 Application topically 2 (two) times daily as needed for irritation (to lip corners). 56.7 g 0   predniSONE  (DELTASONE ) 20 MG tablet Take 1 tablet (20 mg total) by mouth daily with breakfast. (Patient not taking: Reported on 04/03/2024) 5 tablet 0   No facility-administered medications prior to visit.    Review of Systems;  Patient denies headache, fevers, malaise, unintentional weight loss, skin rash, eye pain, sinus congestion and sinus pain, sore throat, dysphagia,  hemoptysis , cough, dyspnea, wheezing, chest pain, palpitations, orthopnea, edema, abdominal pain, nausea, melena, diarrhea, constipation, flank pain, dysuria, hematuria, urinary  Frequency, nocturia, numbness, tingling, seizures,  Focal weakness, Loss of consciousness,  Tremor, insomnia, depression, anxiety, and suicidal ideation.      Objective:  BP (!) 150/76   Pulse 73   Ht 5' 4 (1.626 m)   Wt 214 lb 12.8 oz (97.4 kg)   SpO2 98%   BMI 36.87 kg/m   BP Readings from Last 3 Encounters:  05/15/24 (!) 150/76  03/27/24 136/70  03/21/24 133/69    Wt Readings from Last 3 Encounters:  05/15/24 214 lb 12.8 oz (97.4 kg)  04/03/24 211 lb (95.7 kg)  03/27/24 213 lb 9.6 oz (96.9 kg)    Physical Exam  Lab Results  Component Value Date   HGBA1C 5.9 (H) 01/26/2024   HGBA1C 6.1 (H) 01/07/2024   HGBA1C 5.9 (H) 08/19/2023    Lab Results  Component Value Date    CREATININE 0.70 03/02/2024   CREATININE 0.79 01/26/2024   CREATININE 0.70 01/10/2024    Lab Results  Component Value Date   WBC 7.6 01/26/2024   HGB 12.0 01/26/2024   HCT 38.1 01/26/2024   PLT 279 01/26/2024   GLUCOSE 96 03/02/2024   CHOL 114  03/02/2024   TRIG 131 03/02/2024   HDL 45 03/02/2024   LDLDIRECT 159 (H) 10/13/2022   LDLCALC 46 03/02/2024   ALT 11 03/02/2024   AST 17 03/02/2024   NA 143 03/02/2024   K 4.5 03/02/2024   CL 105 03/02/2024   CREATININE 0.70 03/02/2024   BUN 13 03/02/2024   CO2 24 03/02/2024   TSH 2.49 03/13/2024   HGBA1C 5.9 (H) 01/26/2024    DG ESOPHAGUS W SINGLE CM (SOL OR THIN BA) Result Date: 04/03/2024 CLINICAL DATA:  68 year old female with a history of GERD, well controlled on medication, esophageal strictures s/p multiple dilations, and worsening dysphagia for the past few months. Patient reports solid foods getting stuck in her mid chest, but denies any issues with liquids. Patient states that is insert cardiac catheterization in August she is no longer a candidate for EGD with dilation. Barium swallow ordered to evaluate for possible dysmotility and stricture. EXAM: ESOPHAGUS/BARIUM SWALLOW/TABLET STUDY TECHNIQUE: Combined double and single contrast examination was performed using effervescent crystals, high-density barium, and thin liquid barium. This exam was performed by [facility] PA-C, and was supervised and interpreted by Dr. JINNY Hall. FLUOROSCOPY: Radiation Exposure Index (as provided by the fluoroscopic device): 48.4 mGy Kerma COMPARISON:  None Available. FINDINGS: Swallowing: Appears normal. No vestibular penetration or aspiration seen. Pharynx: Unremarkable. Esophagus: Normal appearance. No obvious mucosal abnormality or visible ulceration. Esophageal motility: Mild esophageal dysmotility with intermittent proximal escape leading to delayed passage of contrast from the esophagus into the stomach. Hiatal Hernia: Small hiatal hernia.  Gastroesophageal reflux: None visualized with coughing or Valsalva. Ingested 13mm barium tablet: Passed without difficulty into the stomach. Other: None. IMPRESSION: Mild esophageal dysmotility and a small hiatus hernia. No overt gastroesophageal reflux was noted on this evaluation. Electronically Signed   By: Thom Hall M.D.   On: 04/03/2024 17:59    Assessment & Plan:  .There are no diagnoses linked to this encounter.   I spent 34 minutes on the day of this face to face encounter reviewing patient's  most recent visit with cardiology,  nephrology,  and neurology,  prior relevant surgical and non surgical procedures, recent  labs and imaging studies, counseling on weight management,  reviewing the assessment and plan with patient, and post visit ordering and reviewing of  diagnostics and therapeutics with patient  .   Follow-up: No follow-ups on file.   Tracey LITTIE Kettering, MD

## 2024-05-16 ENCOUNTER — Encounter: Admitting: Emergency Medicine

## 2024-05-16 DIAGNOSIS — M6281 Muscle weakness (generalized): Secondary | ICD-10-CM | POA: Insufficient documentation

## 2024-05-16 DIAGNOSIS — I214 Non-ST elevation (NSTEMI) myocardial infarction: Secondary | ICD-10-CM | POA: Diagnosis not present

## 2024-05-16 DIAGNOSIS — M791 Myalgia, unspecified site: Secondary | ICD-10-CM | POA: Insufficient documentation

## 2024-05-16 DIAGNOSIS — Z955 Presence of coronary angioplasty implant and graft: Secondary | ICD-10-CM

## 2024-05-16 LAB — COMPREHENSIVE METABOLIC PANEL WITH GFR
ALT: 11 U/L (ref 0–35)
AST: 18 U/L (ref 0–37)
Albumin: 4 g/dL (ref 3.5–5.2)
Alkaline Phosphatase: 74 U/L (ref 39–117)
BUN: 15 mg/dL (ref 6–23)
CO2: 30 meq/L (ref 19–32)
Calcium: 9.3 mg/dL (ref 8.4–10.5)
Chloride: 105 meq/L (ref 96–112)
Creatinine, Ser: 0.69 mg/dL (ref 0.40–1.20)
GFR: 90.47 mL/min (ref >60.00)
Glucose, Bld: 99 mg/dL (ref 70–99)
Potassium: 4.3 meq/L (ref 3.5–5.1)
Sodium: 141 meq/L (ref 135–145)
Total Bilirubin: 0.4 mg/dL (ref 0.2–1.2)
Total Protein: 7.3 g/dL (ref 6.0–8.3)

## 2024-05-16 LAB — C-REACTIVE PROTEIN: CRP: 1.6 mg/dL (ref 0.5–20.0)

## 2024-05-16 LAB — CK: Total CK: 101 U/L (ref 17–177)

## 2024-05-16 LAB — SEDIMENTATION RATE: Sed Rate: 19 mm/h (ref 0–30)

## 2024-05-16 NOTE — Assessment & Plan Note (Signed)
 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

## 2024-05-16 NOTE — Assessment & Plan Note (Addendum)
 Complicated by hypertension . She is exercising regularly as tolerated , and has lost 6 lbs this year.  Her efforts are  limitedsomewhat by her rheumatologic issues

## 2024-05-16 NOTE — Assessment & Plan Note (Signed)
 Etiology unclear;  checking ESR CRP,  CK , electrolytes and referring to neurology for EMG/[place] studies given concurrent proximal muscle weakness

## 2024-05-16 NOTE — Progress Notes (Signed)
 Daily Session Note  Patient Details  Name: Tracey Wiley MRN: [id number] Date of Birth: 1955/09/14 Referring Provider:   Flowsheet Row Cardiac Rehab from 01/31/2024 in [facility] Cardiac and Pulmonary Rehab  Referring Provider Dr. Evalene Lunger    Encounter Date: 05/16/2024  Check In:  Session Check In - 05/16/24 1414       Check-In   Supervising physician immediately available to respond to emergencies See telemetry face sheet for immediately available ER MD    Location [facility]-Cardiac & Pulmonary Rehab    Staff Present Leita Franks RN,BSN;Kelly Dyane BS, ACSM CEP, Exercise Physiologist;Noah Tickle, BS, Exercise Physiologist;Kelly [facility]    Virtual Visit No    Medication changes reported     No    Fall or balance concerns reported    No    Tobacco Cessation No Change    Warm-up and Cool-down Performed on first and last piece of equipment    Resistance Training Performed Yes    VAD Patient? No    PAD/SET Patient? No      Pain Assessment   Currently in Pain? No/denies             Social History   Tobacco Use  Smoking Status Never  Smokeless Tobacco Never    Goals Met:  Independence with exercise equipment Exercise tolerated well No report of concerns or symptoms today Strength training completed today  Goals Unmet:  Not Applicable  Comments: Pt able to follow exercise prescription today without complaint.  Will continue to monitor for progression.    Dr. Oneil Pinal is Medical Director for [facility] Cardiac Rehabilitation.  Dr. Fuad Aleskerov is Medical Director for [facility] Pulmonary Rehabilitation.

## 2024-05-17 ENCOUNTER — Ambulatory Visit: Admitting: Clinical

## 2024-05-17 ENCOUNTER — Ambulatory Visit: Payer: Self-pay | Admitting: Internal Medicine

## 2024-05-17 DIAGNOSIS — F4322 Adjustment disorder with anxiety: Secondary | ICD-10-CM
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
## 2024-05-21 ENCOUNTER — Encounter

## 2024-05-23 ENCOUNTER — Encounter

## 2024-05-23 ENCOUNTER — Encounter: Payer: Self-pay | Admitting: *Deleted

## 2024-05-23 DIAGNOSIS — Z955 Presence of coronary angioplasty implant and graft: Secondary | ICD-10-CM

## 2024-05-23 DIAGNOSIS — I214 Non-ST elevation (NSTEMI) myocardial infarction: Secondary | ICD-10-CM

## 2024-05-23 NOTE — Progress Notes (Signed)
 Daily Session Note  Patient Details  Name: Tracey Wiley MRN: [id number] Date of Birth: 02/26/1956 Referring Provider:   Flowsheet Row Cardiac Rehab from 01/31/2024 in [facility] Cardiac and Pulmonary Rehab  Referring Provider Dr. Evalene Lunger    Encounter Date: 05/23/2024  Check In:  Session Check In - 05/23/24 1439       Check-In   Supervising physician immediately available to respond to emergencies See telemetry face sheet for immediately available ER MD    Location [facility]-Cardiac & Pulmonary Rehab    Staff Present Leita Franks RN,BSN;Noah Tickle, BS, Exercise Physiologist;Kelly Dyane HECKLE, ACSM CEP, Exercise Physiologist;Kelly [facility]    Virtual Visit No    Medication changes reported     No    Fall or balance concerns reported    No    Tobacco Cessation No Change    Warm-up and Cool-down Performed on first and last piece of equipment    Resistance Training Performed Yes    VAD Patient? No    PAD/SET Patient? No      Pain Assessment   Currently in Pain? No/denies             Tobacco Use History[1]  Goals Met:  Independence with exercise equipment Exercise tolerated well No report of concerns or symptoms today Strength training completed today  Goals Unmet:  Not Applicable  Comments: Pt able to follow exercise prescription today without complaint.  Will continue to monitor for progression.    Dr. Oneil Pinal is Medical Director for [facility] Cardiac Rehabilitation.  Dr. Fuad Aleskerov is Medical Director for [facility] Pulmonary Rehabilitation.    [1]  Social History Tobacco Use  Smoking Status Never  Smokeless Tobacco Never

## 2024-05-23 NOTE — Progress Notes (Signed)
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 Training   Weight 4 lb    Reps 10-15      Interval Training   Interval Training No      NuStep   Level 1    Minutes 15    METs 2.2      T5 Nustep   Level 1    Minutes 15    METs 1.9      Track   Laps 12    Minutes 15    METs 1.65      Home Exercise Plan   Plans to continue exercise at [employer] (comment)   Slow and Steady Fitness   Frequency Add 2 additional days to program exercise sessions.    Initial Home Exercises Provided 03/05/24      Oxygen   Maintain Oxygen Saturation 88% or higher          Nutrition:  Target Goals: Understanding of nutrition guidelines, daily intake of sodium 1500mg , cholesterol 200mg , calories 30% from fat and 7% or less from saturated fats, daily to have 5 or more servings of fruits and vegetables.  Education: Nutrition 1 -Group instruction provided by verbal, written material, interactive activities,  discussions, models, and posters to present general guidelines for heart healthy nutrition including macronutrients, label reading, and promoting whole foods over processed counterparts. Education serves as pensions consultant of discussion of heart healthy eating for all. Written material provided at class time.    Education: Nutrition 2 -Group instruction  provided by verbal, written material, interactive activities, discussions, models, and posters to present general guidelines for heart healthy nutrition including sodium, cholesterol, and saturated fat. Providing guidance of habit forming to improve blood pressure, cholesterol, and body weight. Written material provided at class time.     Biometrics:  Pre Biometrics - 01/31/24 1513       Pre Biometrics   Height 5' 4.8 (1.646 m)    Weight 215 lb 9.6 oz (97.8 kg)    Waist Circumference 45.5 inches    Hip Circumference 48 inches    Waist to Hip Ratio 0.95 %    BMI (Calculated) 36.1    Single Leg Stand 30 seconds           Nutrition Therapy Plan and Nutrition Goals:  Nutrition Therapy & Goals - 03/12/24 1555       Nutrition Therapy   Diet Cardiac, Low Na    Protein (specify units) 80-90    Fiber 25 grams    Whole Grain Foods 3 servings    Saturated Fats 15 max. grams    Fruits and Vegetables 5 servings/day    Sodium 2 grams      Personal Nutrition Goals   Nutrition Goal Read labels and reduce sodium intake to below 2300mg . Ideally 1500mg  per day.    Personal Goal #2 Reduce saturated fat, less than 12g per day. Replace bad fats for more heart healthy fats.    Personal Goal #3 Eat 15-30gProtein and 30-60gCarbs at each meal.    Comments Tracey Wiley drinks mostly water . She eats breakfast late and usually skips lunch or will have a snack. She likes fruit as a snack. Also eating RX bars as snacks when on the go. Reviewed Rx bar labels and discussed what makes a food heart healthy or not. Educated on carbs per meals to help manage blood sugars  and control A1C. Encouraged her to pair carbs with protein and healthy fats. Provided Mediterranean diet handout. Educated on types of fats, sources, and how to read labels to find unmarked nutrients like healthy unsaturated fats. Provided guideline limits of less than 1500mg  sodium and less than 12g saturated fat.      Intervention Plan   Intervention Prescribe, educate and counsel regarding individualized specific dietary modifications aiming towards targeted core components such as weight, hypertension, lipid management, diabetes, heart failure and other comorbidities.;Nutrition handout(s) given to patient.    Expected Outcomes Long Term Goal: Adherence to prescribed nutrition plan.;Short Term Goal: A plan has been developed with personal nutrition goals set during dietitian appointment.;Short Term Goal: Understand basic principles of dietary content, such as calories, fat, sodium, cholesterol and nutrients.          Nutrition Assessments:  MEDIFICTS Score Key: >=70 Need to make dietary changes  40-70 Heart Healthy Diet <= 40 Therapeutic Level Cholesterol Diet  Flowsheet Row Cardiac Rehab from 01/31/2024 in [facility] Cardiac [facility] Pul[facility] Rehab  Picture Your Plate Total Score on Admission 71   Picture Your Plate Scores: <59 Unhealthy dietary pattern with much room for improvement. 41-50 Dietary pattern unlikely to meet recommendations for good health and room for improvement. 51-60 More healthful dietary pattern, with some room for improvement.  >60 Healthy dietary pattern, although there may be some specific behaviors that could be improved.    Nutrition Goals Re-Evaluation:  Nutrition Goals Re-Evaluation     Row Name 04/16/24 1403 05/09/24 1428           Goals   Comment Tracey Wiley continues to  use the information provided by the RD to better read food labels. She eats takeout about 3 times a week, but tries to watch her sodium intake. Tracey Wiley is reading food labels for the things she buys.  Her husband is the one who cooks so she is not always sure how much sodium is in her food when she does not cook. She continues to eat a protein bar each day that she discussed with RD and he said it was a good protein bar. She is trying to pair fruit with protein in order to help control blood sugars.      Expected Outcome Short: Continue to read food labels, especially with takeout. Long: Continue focus on cooking at home and limiting takeout food. Short: continue to read food labels and reduce sodium. Continue to balance carbs and protein to help control blood sugars as a pre-diabetic.         Nutrition Goals Discharge (Final Nutrition Goals Re-Evaluation):  Nutrition Goals Re-Evaluation - 05/09/24 1428       Goals   Comment Alayshia is reading food labels for the things she buys. Her husband is the one who cooks so she is not always sure how much sodium is in her food when she does not cook. She continues to eat a protein bar each day that she discussed with RD and he said it was a good protein bar. She is trying to pair fruit with protein in order to help control blood sugars.    Expected Outcome Short: continue to read food labels and reduce sodium. Continue to balance carbs and protein to help control blood sugars as a pre-diabetic.          Psychosocial: Target Goals: Acknowledge presence or absence of significant depression and/or stress, maximize coping skills, provide positive support system. Participant is able to verbalize types and ability to use techniques and skills needed for reducing stress and depression.   Education: Stress, Anxiety, and Depression - Group verbal and visual presentation to define topics covered.  Reviews how body is impacted by stress, anxiety, and depression.  Also discusses healthy ways to reduce stress and to treat/manage anxiety and depression. Written material provided at class time.   Education: Sleep Hygiene -Provides group verbal and written  instruction about how sleep can affect your health.  Define sleep hygiene, discuss sleep cycles and impact of sleep habits. Review good sleep hygiene tips.   Initial Review & Psychosocial Screening:  Initial Psych Review & Screening - 01/27/24 1528       Initial Review   Current issues with Current Stress Concerns;Current Anxiety/Panic    Source of Stress Concerns Chronic Illness      Family Dynamics   Good Support System? Yes   fiance     Barriers   Psychosocial barriers to participate in program There are no identifiable barriers or psychosocial needs.      Screening Interventions   Interventions Encouraged to exercise;Provide feedback about the scores to participant;To provide support and resources with identified psychosocial needs    Expected Outcomes Long Term Goal: Stressors or current issues are controlled or eliminated.;Short Term goal: Utilizing psychosocial counselor, staff and physician to assist with identification of specific Stressors or current issues interfering with healing process. Setting desired goal for each stressor or current issue identified.;Short Term goal: Identification and review with participant of any Quality of Life or Depression concerns found by scoring the questionnaire.;Long Term goal: The participant improves quality of Life and PHQ9 Scores  as seen by post scores and/or verbalization of changes          Quality of Life Scores:   Quality of Life - 01/31/24 1514       Quality of Life   Select Quality of Life      Quality of Life Scores   Health/Function Pre 13.27 %    Socioeconomic Pre 17.88 %    Psych/Spiritual Pre 18.07 %    Family Pre 18.5 %    GLOBAL Pre 16.03 %         Scores of 19 and below usually indicate a poorer quality of life in these areas.  A difference of  2-3 points is a clinically meaningful difference.  A difference of 2-3 points in the total score of the Quality of Life Index has been associated with significant  improvement in overall quality of life, self-image, physical symptoms, and general health in studies assessing change in quality of life.  PHQ-9: Review Flowsheet  More data exists      05/15/2024 04/16/2024 04/03/2024 03/02/2024 02/27/2024  Depression screen PHQ 2/9  Decreased Interest 0 1 0 0 0  Down, Depressed, Hopeless 1 1 1 1 1   PHQ - 2 Score 1 2 1 1 1   Altered sleeping 0 0 1 1 2   Tired, decreased energy 1 0 2 1 1   Change in appetite 0 0 0 0 0  Feeling bad or failure about yourself  0 0 0 0 0  Trouble concentrating 1 1 0 1 1  Moving slowly or fidgety/restless 0 0 0 0 0  Suicidal thoughts 0 0 0 0 0  PHQ-9 Score 3 3 4  4  5    Difficult doing work/chores Somewhat difficult Somewhat difficult Somewhat difficult Somewhat difficult Somewhat difficult    Details       Data saved with a previous flowsheet row definition        Interpretation of Total Score  Total Score Depression Severity:  1-4 = Minimal depression, 5-9 = Mild depression, 10-14 = Moderate depression, 15-19 = Moderately severe depression, 20-27 = Severe depression   Psychosocial Evaluation and Intervention:  Psychosocial Evaluation - 01/27/24 1555       Psychosocial Evaluation & Interventions   Interventions Encouraged to exercise with the program and follow exercise prescription;Stress management education;Relaxation education    Comments Tracey 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 This came as a surprise to her as she had been quite active in physical therapy related to her autoimmune disease/chronic vertigo.  She states she is frustrated that this happened because she has taken care of her body and now she is having to reschedule appointments she had planned for months (like endoscopy, dentist, dermatology, etc). She has an undiagnosed autoimmune disease that doctors have tried to diagnose, but now are focusing on treating the symptoms. She mentions that in addition to her own stress, her fiance has been  having to deal with all that happened with her MI and seeing her in an ambulance, having a vasovagal response post stent, and the hospital stay in general. They want to move forward but  there is some fear there as to what the new normal is going to be. She meets with a therapist as well. 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 She is interested in learning more about a healthy cardiac lifestyle and is committed to attending the program    Expected Outcomes  Short: attend cardiac rehab for educaiton and exercise Long: develop and maintain positive self care habits    Continue Psychosocial Services  Follow up required by staff          Psychosocial Re-Evaluation:  Psychosocial Re-Evaluation     Row Name 02/27/24 1700 04/16/24 1353 05/09/24 1436         Psychosocial Re-Evaluation   Current issues with Current Stress Concerns Current Stress Concerns Current Stress Concerns     Comments Tracey Wiley has a lot of stressors in her life. 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 This causes significant stress in her life as she takes care of him every day and she does not get along with him. 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 She tries to find time for herself when she can between work, appointments, and caretaking. She is wanting to move forward with her life in the process of getting married to her fiance, but is overwhelmed and stressed from all of her responsibilities and recent health events to add wedding planning to the list. Her only support here is her fiance. She does see a psychotherapist weekly to help work on her stress concerns. She states she does not know how to deal with her stress as she just goes right back to work. We discussed ways to help reduce her stress daily. She practices  diaphragmatic breathing her psychotherapist taught her and she is enjoying coming to rehab as it makes her stop and rest when exercising. We discussed these extra benefits regular exercise can give with dealing with stress. We reassessed her PHQ and her score went down to 5. Tracey Wiley is still dealing with some stress. She continues to be the primary caregiver to her ex-husband, and that continues to be the main source of her stress. She feels stretched thin and states that his needs continue to grow, and time for herself becomes less. She has a great support system made up of her husband, and her 2 counselors that she sees once a week, and she continues to exercise in the program and outside for stress relief. Tracey Wiley states that she has no concerns with her sleep, she does state that she wakes up with some occasional arthritic pain, but is able to go back to sleep. Tracey Wiley reports that she is sleeping better at night. She had her heart event at night and for a while it cause a bit of anxiety to try to fall asleep, but that has improved. Her stress related to caregiving for her ex-husband continues but she does continue to meet with her 2 councelors weeking to work on coping and her current husband is a great support.     Expected Outcomes Short: Work on finding ways to reduce stress and cope with her multitude of stress throughout the day.  Long: Coninue to attend Cardiac Rehabilitiation for the benefits of exercise on stress. Short: Continue to lean on support system to reduce stress, add exercise days to focus on herself. Long: Continue to attend cardiac rehabilitation for the benefits in stress relief. Short: continue to meet with councelors to work on coping with stress levels. Long: maintain good mental health routine.     Interventions Stress management education;Encouraged to attend Cardiac Rehabilitation for the exercise Encouraged to attend Cardiac Rehabilitation for the exercise Encouraged to  attend Cardiac  Rehabilitation for the exercise     Continue Psychosocial Services  Follow up required by staff Follow up required by staff Follow up required by staff        Psychosocial Discharge (Final Psychosocial Re-Evaluation):  Psychosocial Re-Evaluation - 05/09/24 1436       Psychosocial Re-Evaluation   Current issues with Current Stress Concerns    Comments Tracey Wiley reports that she is sleeping better at night. She had her heart event at night and for a while it cause a bit of anxiety to try to fall asleep, but that has improved. Her stress related to caregiving for her ex-husband continues but she does continue to meet with her 2 councelors weeking to work on coping and her current husband is a great support.    Expected Outcomes Short: continue to meet with councelors to work on coping with stress levels. Long: maintain good mental health routine.    Interventions Encouraged to attend Cardiac Rehabilitation for the exercise    Continue Psychosocial Services  Follow up required by staff          Vocational Rehabilitation: Provide vocational rehab assistance to qualifying candidates.   Vocational Rehab Evaluation & Intervention:  Vocational Rehab - 01/27/24 1528       Initial Vocational Rehab Evaluation & Intervention   Assessment shows need for Vocational Rehabilitation No          Education: Education Goals: Education classes will be provided on a variety of topics geared toward better understanding of heart health and risk factor modification. Participant will state understanding/return demonstration of topics presented as noted by education test scores.  Learning Barriers/Preferences:  Learning Barriers/Preferences - 01/27/24 1528       Learning Barriers/Preferences   Learning Barriers None    Learning Preferences None          General Cardiac Education Topics:  AED/CPR: - Group verbal and written instruction with the use of models to demonstrate the basic use of the AED  with the basic ABC's of resuscitation.   Test and Procedures: - Group verbal and visual presentation and models provide information about basic cardiac anatomy and function. Reviews the testing methods done to diagnose heart disease and the outcomes of the test results. Describes the treatment choices: Medical Management, Angioplasty, or Coronary Bypass Surgery for treating various heart conditions including Myocardial Infarction, Angina, Valve Disease, and Cardiac Arrhythmias. Written material provided at class time. Flowsheet Row Cardiac Rehab from 04/11/2024 in [facility] Cardiac and Pulmonary Rehab  Date 04/11/24  Educator lc  Instruction Review Code 1- Verbalizes Understanding    Medication Safety: - Group verbal and visual instruction to review commonly prescribed medications for heart and lung disease. Reviews the medication, class of the drug, and side effects. Includes the steps to properly store meds and maintain the prescription regimen. Written material provided at class time. Flowsheet Row Cardiac Rehab from 04/11/2024 in [facility] Cardiac and Pulmonary Rehab  Education need identified 01/31/24    Intimacy: - Group verbal instruction through game format to discuss how heart and lung disease can affect sexual intimacy. Written material provided at class time.   Know Your Numbers and Heart Failure: - Group verbal and visual instruction to discuss disease risk factors for cardiac and pulmonary disease and treatment options.  Reviews associated critical values for Overweight/Obesity, Hypertension, Cholesterol, and Diabetes.  Discusses basics of heart failure: signs/symptoms and treatments.  Introduces Heart Failure Zone chart for action plan for heart failure. Written material provided  at class time.   Infection Prevention: - Provides verbal and written material to individual with discussion of infection control including proper hand washing and proper equipment cleaning during exercise  session. Flowsheet Row Cardiac Rehab from 04/11/2024 in [facility] Cardiac and Pulmonary Rehab  Date 01/31/24  Educator [facility]  Instruction Review Code 1- Verbalizes Understanding    Falls Prevention: - Provides verbal and written material to individual with discussion of falls prevention and safety. Flowsheet Row Cardiac Rehab from 04/11/2024 in [facility] Cardiac and Pulmonary Rehab  Date 01/31/24  Educator [facility]  Instruction Review Code 1- Verbalizes Understanding    Other: -Provides group and verbal instruction on various topics (see comments)   Knowledge Questionnaire Score:  Knowledge Questionnaire Score - 01/31/24 1515       Knowledge Questionnaire Score   Pre Score 22/26          Core Components/Risk Factors/Patient Goals at Admission:  Personal Goals and Risk Factors at Admission - 01/27/24 1526       Core Components/Risk Factors/Patient Goals on Admission   Hypertension Yes    Intervention Provide education on lifestyle modifcations including regular physical activity/exercise, weight management, moderate sodium restriction and increased consumption of fresh fruit, vegetables, and low fat dairy, alcohol moderation, and smoking cessation.;Monitor prescription use compliance.    Expected Outcomes Short Term: Continued assessment and intervention until BP is < 140/72mm HG in hypertensive participants. < 130/65mm HG in hypertensive participants with diabetes, heart failure or chronic kidney disease.;Long Term: Maintenance of blood pressure at goal levels.    Lipids Yes    Intervention Provide education and support for participant on nutrition & aerobic/resistive exercise along with prescribed medications to achieve LDL 70mg , HDL >40mg .    Expected Outcomes Short Term: Participant states understanding of desired cholesterol values and is compliant with medications prescribed. Participant is following exercise prescription and nutrition guidelines.;Long Term: Cholesterol controlled with  medications as prescribed, with individualized exercise RX and with personalized nutrition plan. Value goals: LDL < 70mg , HDL > 40 mg.          Education:Diabetes - Individual verbal and written instruction to review signs/symptoms of diabetes, desired ranges of glucose level fasting, after meals and with exercise. Acknowledge that pre and post exercise glucose checks will be done for 3 sessions at entry of program.   Core Components/Risk Factors/Patient Goals Review:   Goals and Risk Factor Review     Row Name 02/27/24 1712 04/16/24 1411 05/09/24 1435         Core Components/Risk Factors/Patient Goals Review   Personal Goals Review Hypertension Hypertension Lipids;Hypertension     Review Tracey Wiley states she is not checking her blood pressure regularly and we discussed adding that into her morning routine and she plans to after a few minutes of rest in the morning after she checks her weight. She is taking her hypertension medication regularly and is very disciplined with all her medications. Geselle is not taking her BP at home. Discussed the importance of regular BP checks at home in addition to days she attends the program. She states that she is taking her medication regularly, but still states that her BP fluctuates. Latrish reports that she is not yet taking BP at home, but that she weighs every day and is planning to put her BP cuff next to the scale as a reminder to check it every day. She takes all her medication for BP and cholesterol and follows up with her doctor for required lab work to monitor  risk factors.     Expected Outcomes Short: Start checking blood pressure in the morning. Long: Continue managing hypertension and working on a heart healthy lifestyle. Short: Start checking BP every other day, work towards every day. Long: Continue to manage hypertension with medication and a heart healthy lifestyle. Short: start checking BP at home. Long: continue to monitor and control cardic risk  factors.        Core Components/Risk Factors/Patient Goals at Discharge (Final Review):   Goals and Risk Factor Review - 05/09/24 1435       Core Components/Risk Factors/Patient Goals Review   Personal Goals Review Lipids;Hypertension    Review Tracey Wiley reports that she is not yet taking BP at home, but that she weighs every day and is planning to put her BP cuff next to the scale as a reminder to check it every day. She takes all her medication for BP and cholesterol and follows up with her doctor for required lab work to monitor risk factors.    Expected Outcomes Short: start checking BP at home. Long: continue to monitor and control cardic risk factors.          ITP Comments:  ITP Comments     Row Name 01/27/24 1541 01/31/24 1509 02/01/24 0749 02/20/24 1359 02/29/24 1156   ITP Comments Initial phone call completed. Diagnosis can be found in [facility] 8/2. EP Orientation scheduled for Tuesday 8/26 at 1:30. Completed and gym orientation for cardiac rehab. Initial ITP created and sent for review to Dr. Oneil Pinal, Medical Director. 30 Day review completed. Medical Director ITP review done, changes made as directed, and signed approval by Medical Director. New to Program. First full day of exercise!  Patient was oriented to gym and equipment including functions, settings, policies, and procedures.  Patient's individual exercise prescription and treatment plan were reviewed.  All starting workloads were established based on the results of the 6 minute walk test done at initial orientation visit.  The plan for exercise progression was also introduced and progression will be customized based on patient's performance and goals. 30 Day review completed. Medical Director ITP review done, changes made as directed, and signed approval by Medical Director. New to program    Row Name 03/28/24 0952 04/25/24 1136 05/23/24 1332       ITP Comments 30 Day review completed. Medical Director ITP review done,  changes made as directed, and signed approval by Medical Director. 30 Day review completed. Medical Director ITP review done, changes made as directed, and signed approval by Medical Director. 30 Day review completed. Medical Director ITP review done, changes made as directed, and signed approval by Medical Director.        Comments: 30 Day Review     [1]  Current Outpatient Medications:    acetaminophen  (TYLENOL ) 500 MG tablet, Take 500 mg by mouth daily as needed for pain., Disp: , Rfl:    aspirin  EC 81 MG tablet, Take 81 mg by mouth daily., Disp: , Rfl:    cholecalciferol (VITAMIN D3) 25 MCG (1000 UNIT) tablet, Take 1,000 Units by mouth daily., Disp: , Rfl:    colchicine  0.6 MG tablet, TAKE 1 TABLET (0.6 MG TOTAL) BY MOUTH 2 (TWO) TIMES DAILY AS NEEDED., Disp: 180 tablet, Rfl: 0   cyanocobalamin (VITAMIN B12) 1000 MCG tablet, Take 1,000 mcg by mouth daily., Disp: , Rfl:    esomeprazole  (NEXIUM ) 40 MG capsule, TAKE 1 CAPSULE (40 MG TOTAL) BY MOUTH DAILY., Disp: 90 capsule, Rfl: 1  Evolocumab  (REPATHA  SURECLICK) 140 MG/ML SOAJ, INJECT 140 MG INTO THE SKIN EVERY 14 (FOURTEEN) DAYS., Disp: 6 mL, Rfl: 3   ezetimibe  (ZETIA ) 10 MG tablet, Take 1 tablet (10 mg total) by mouth daily., Disp: 90 tablet, Rfl: 3   famotidine  (PEPCID ) 20 MG tablet, TAKE 1 TABLET BY MOUTH EVERYDAY AT BEDTIME, Disp: 90 tablet, Rfl: 0   Fexofenadine HCl (ALLEGRA PO), Take 1 tablet by mouth every morning., Disp: , Rfl:    Flaxseed, Linseed, (FLAX SEED OIL) 1000 MG CAPS, Take 1 capsule by mouth at bedtime., Disp: , Rfl:    furosemide  (LASIX ) 20 MG tablet, TAKE 1 TABLET BY MOUTH EVERY DAY AS NEEDED (Patient taking differently: Take 20 mg by mouth as needed.), Disp: 90 tablet, Rfl: 3   ipratropium (ATROVENT ) 0.06 % nasal spray, USE 1 SPRAY IN EACH NOSTRIL 1-2 TIMES DAILY AS NEEDED TO CONTROL DRAINAGE, Disp: 45 mL, Rfl: 0   levalbuterol  (XOPENEX  HFA) 45 MCG/ACT inhaler, Inhale 2 puffs into the lungs every 4 (four) hours as  needed for wheezing., Disp: 15 g, Rfl: 1   magnesium  gluconate (MAGONATE) 500 MG tablet, Take 500 mg by mouth at bedtime., Disp: , Rfl:    MELATONIN ER PO, Take 6 mg by mouth at bedtime., Disp: , Rfl:    metoprolol  tartrate (LOPRESSOR ) 25 MG tablet, Take 1 tablet (25 mg total) by mouth 2 (two) times daily., Disp: 60 tablet, Rfl: 11   nitroGLYCERIN  (NITROSTAT ) 0.4 MG SL tablet, Place 1 tablet (0.4 mg total) under the tongue every 5 (five) minutes as needed for chest pain., Disp: 50 tablet, Rfl: 3   Omega-3 Fatty Acids (OMEGA-3 FISH OIL PO), Take 1 capsule by mouth daily., Disp: , Rfl:    ondansetron  (ZOFRAN  ODT) 4 MG disintegrating tablet, Take 1 tablet (4 mg total) by mouth every 8 (eight) hours as needed for nausea or vomiting., Disp: 20 tablet, Rfl: 0   potassium chloride  (KLOR-CON ) 10 MEQ tablet, TAKE 1 TABLET (10 MEQ TOTAL) BY MOUTH DAILY AS NEEDED. (Patient taking differently: Take 10 mEq by mouth as needed.), Disp: 90 tablet, Rfl: 3   prasugrel  (EFFIENT ) 10 MG TABS tablet, TAKE 1 TABLET BY MOUTH EVERY DAY, Disp: 30 tablet, Rfl: 2   promethazine  (PHENERGAN ) 12.5 MG tablet, Take 12.5 mg by mouth every 6 (six) hours as needed for nausea or vomiting., Disp: , Rfl:    sucralfate  (CARAFATE ) 1 g tablet, Take 1 tablet (1 g total) by mouth 2 (two) times daily. Place 1 tablet in 10 ml of warm water  and drink twice daily as needed., Disp: 60 tablet, Rfl: 2   valsartan  (DIOVAN ) 40 MG tablet, Take 1 tablet (40 mg total) by mouth daily., Disp: 30 tablet, Rfl: 11   vitamin C (ASCORBIC ACID) 500 MG tablet, Take 1,000 mg by mouth daily., Disp: , Rfl:    zinc  gluconate 50 MG tablet, Take 50 mg by mouth daily., Disp: , Rfl:    zinc  oxide (MEIJER ZINC  OXIDE) 20 % ointment, Apply 1 Application topically 2 (two) times daily as needed for irritation (to lip corners)., Disp: 56.7 g, Rfl: 0 [2]  Social History Tobacco Use  Smoking Status Never  Smokeless Tobacco Never

## 2024-05-24 ENCOUNTER — Ambulatory Visit (INDEPENDENT_AMBULATORY_CARE_PROVIDER_SITE_OTHER): Admitting: Clinical

## 2024-05-24 DIAGNOSIS — F4322 Adjustment disorder with anxiety: Secondary | ICD-10-CM | POA: Diagnosis not present
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
## 2024-05-26 ENCOUNTER — Other Ambulatory Visit: Payer: Self-pay | Admitting: Cardiology
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
## 2024-05-28 ENCOUNTER — Encounter: Admitting: Emergency Medicine

## 2024-05-28 DIAGNOSIS — I214 Non-ST elevation (NSTEMI) myocardial infarction: Secondary | ICD-10-CM | POA: Diagnosis not present

## 2024-05-28 DIAGNOSIS — Z955 Presence of coronary angioplasty implant and graft: Secondary | ICD-10-CM

## 2024-05-28 NOTE — Progress Notes (Signed)
 Daily Session Note  Patient Details  Name: Tracey Wiley MRN: [id number] Date of Birth: September 07, 1955 Referring Provider:   Flowsheet Row Cardiac Rehab from 01/31/2024 in [facility]  Referring Provider Dr. Evalene Lunger    Encounter Date: 05/28/2024  Check In:  Session Check In - 05/28/24 1412       Check-In   Supervising physician immediately available to respond to emergencies See telemetry face sheet for immediately available ER MD    Location [facility]-Cardiac & Pulmonary Rehab    Staff Present Leita Franks RN,BSN;Noah Tickle, BS, Exercise Physiologist;Kristen Coble RN,BC,MSN;Margaret Best, MS, Exercise Physiologist    Virtual Visit No    Medication changes reported     No    Fall or balance concerns reported    No    Tobacco Cessation No Change    Warm-up and Cool-down Performed on first and last piece of equipment    Resistance Training Performed Yes    VAD Patient? No    PAD/SET Patient? No      Pain Assessment   Currently in Pain? No/denies             Tobacco Use History[1]  Goals Met:  Independence with exercise equipment Exercise tolerated well No report of concerns or symptoms today Strength training completed today  Goals Unmet:  Not Applicable  Comments: Pt able to follow exercise prescription today without complaint.  Will continue to monitor for progression.    Dr. Oneil Pinal is Medical Director for [facility] Cardiac Rehabilitation.  Dr. Fuad Aleskerov is Medical Director for [facility] Pulmonary Rehabilitation.    [1]  Social History Tobacco Use  Smoking Status Never  Smokeless Tobacco Never

## 2024-05-29 ENCOUNTER — Ambulatory Visit: Attending: Cardiology

## 2024-05-29 ENCOUNTER — Encounter: Payer: Self-pay | Admitting: Cardiology

## 2024-05-29 VITALS — BP 118/60 | HR 63 | Ht 65.0 in | Wt 212.6 lb

## 2024-05-29 DIAGNOSIS — E785 Hyperlipidemia, unspecified: Secondary | ICD-10-CM | POA: Diagnosis not present

## 2024-05-29 DIAGNOSIS — I2510 Atherosclerotic heart disease of native coronary artery without angina pectoris: Secondary | ICD-10-CM

## 2024-05-29 DIAGNOSIS — I471 Supraventricular tachycardia, unspecified: Secondary | ICD-10-CM | POA: Diagnosis not present

## 2024-05-29 DIAGNOSIS — I10 Essential (primary) hypertension: Secondary | ICD-10-CM | POA: Diagnosis not present

## 2024-05-29 DIAGNOSIS — I5189 Other ill-defined heart diseases: Secondary | ICD-10-CM

## 2024-05-29 MED ORDER — PRASUGREL HCL 10 MG PO TABS: 10.0000 mg | ORAL_TABLET | Freq: Every day | ORAL | 3 refills | Status: AC

## 2024-05-29 NOTE — Patient Instructions (Signed)
 Medication Instructions:  Your physician recommends that you continue on your current medications as directed. Please refer to the Current Medication list given to you today.   *If you need a refill on your cardiac medications before your next appointment, please call your pharmacy*  Lab Work: No labs ordered today  If you have labs (blood work) drawn today and your tests are completely normal, you will receive your results only by: MyChart Message (if you have MyChart) OR A paper copy in the mail If you have any lab test that is abnormal or we need to change your treatment, we will call you to review the results.  Testing/Procedures: No test ordered today   Follow-Up: At [facility], you and your health needs are our priority.  As part of our continuing mission to provide you with exceptional heart care, our providers are all part of one team.  This team includes your primary Cardiologist (physician) and Advanced Practice Providers or APPs (Physician Assistants and Nurse Practitioners) who all work together to provide you with the care you need, when you need it.  Your next appointment:   6 month(s)  Provider:   You may see Timothy Gollan, MD or one of the following Advanced Practice Providers on your designated Care Team:   Tylene Lunch, NP

## 2024-05-30 ENCOUNTER — Encounter

## 2024-06-01 NOTE — Addendum Note (Signed)
 Addended by: Ajai Terhaar D on: 06/01/2024 07:08 AM   Modules accepted: Orders

## 2024-06-04 ENCOUNTER — Ambulatory Visit: Admitting: Clinical

## 2024-06-04 ENCOUNTER — Encounter

## 2024-06-04 DIAGNOSIS — F4322 Adjustment disorder with anxiety: Secondary | ICD-10-CM | POA: Diagnosis not present
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
## 2024-06-06 ENCOUNTER — Encounter

## 2024-06-06 DIAGNOSIS — Z955 Presence of coronary angioplasty implant and graft: Secondary | ICD-10-CM

## 2024-06-06 DIAGNOSIS — I214 Non-ST elevation (NSTEMI) myocardial infarction: Secondary | ICD-10-CM | POA: Diagnosis not present

## 2024-06-06 NOTE — Progress Notes (Signed)
 Daily Session Note  Patient Details  Name: Tracey Wiley MRN: [id number] Date of Birth: 1955/08/06 Referring Provider:   Flowsheet Row Cardiac Rehab from 01/31/2024 in [facility] Cardiac [facility] Pulmonary Rehab  Referring Provider Dr. Evalene Lunger    Encounter Date: 06/06/2024  Check In:  Session Check In - 06/06/24 1400       Check-In   Supervising physician immediately available to respond to emergencies See telemetry face sheet for immediately available ER MD    Location [facility]-Cardiac & Pulmonary Rehab    Staff Present Burnard Davenport RN,BSN,MPA;Laura Cates RN,BSN;Noah Laird, BS, Exercise Physiologist;Laureen Delores, BS, RRT, CPFT    Virtual Visit No    Medication changes reported     No    Fall or balance concerns reported    No    Tobacco Cessation No Change    Warm-up and Cool-down Performed on first and last piece of equipment    Resistance Training Performed Yes    VAD Patient? No    PAD/SET Patient? No      Pain Assessment   Currently in Pain? No/denies             Tobacco Use History[1]  Goals Met:  Independence with exercise equipment Exercise tolerated well No report of concerns or symptoms today Strength training completed today  Goals Unmet:  Not Applicable  Comments: Pt able to follow exercise prescription today without complaint.  Will continue to monitor for progression.    Dr. Oneil Pinal is Medical Director for [facility] Cardiac Rehabilitation.  Dr. Fuad Aleskerov is Medical Director for [facility] Pulmonary Rehabilitation.    [1]  Social History Tobacco Use  Smoking Status Never  Smokeless Tobacco Never

## 2024-06-11 ENCOUNTER — Encounter

## 2024-06-13 ENCOUNTER — Encounter

## 2024-06-14 ENCOUNTER — Ambulatory Visit: Admitting: Clinical

## 2024-06-14 DIAGNOSIS — F4322 Adjustment disorder with anxiety: Secondary | ICD-10-CM
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
## 2024-06-15 ENCOUNTER — Encounter: Payer: Self-pay | Admitting: Internal Medicine

## 2024-06-15 ENCOUNTER — Ambulatory Visit: Admitting: Internal Medicine

## 2024-06-15 VITALS — BP 122/78 | HR 67 | Temp 98.1°F | Ht 65.0 in | Wt 214.6 lb

## 2024-06-15 DIAGNOSIS — M109 Gout, unspecified: Secondary | ICD-10-CM | POA: Diagnosis not present

## 2024-06-15 DIAGNOSIS — M79675 Pain in left toe(s): Secondary | ICD-10-CM

## 2024-06-15 DIAGNOSIS — R7303 Prediabetes: Secondary | ICD-10-CM | POA: Diagnosis not present

## 2024-06-15 DIAGNOSIS — M791 Myalgia, unspecified site: Secondary | ICD-10-CM | POA: Diagnosis not present

## 2024-06-15 NOTE — Progress Notes (Unsigned)
 "  Subjective:  Patient ID: Tracey Wiley, female    DOB: January 15, 1956  Age: 69 y.o. MRN: [id number]  CC: The primary encounter diagnosis was Pain of left great toe. Diagnoses of Prediabetes, Muscle pain, Myalgia due to statin, and Gouty arthritis were also pertinent to this visit.   HPI Tracey Wiley presents for  Chief Complaint  Patient presents with   medical managment of chronic issues    4 week follow up   Getting OT FOR  LEFT THUMB  DEGENERATIVE ARTHRITIS WITH CYST PER ORTHOED EVALUATION,  WEARING A SPLINT   PT REFERRAL FOR Recurring BILATERAL HIP PAIN ON HOLD until Tracey Wiley has completed cARDIAC REHAB.  Finding cardiac rehab difficult and painful due to joint pain . AS DONE 24/36 SESSIONS  THUS FAR.   SEEING [facility] IN MARCH FOR EMG NERVE CONDUCTION STUDIES   SAW RHEUM AT [facility],  MRI HIPS ORDERED ALONG WITH LUMBAR SPINE W/O CONTRAST    RS3PE   CC:  LEFT TOE PAIN STARTED SWELLING AND HURTING LAST NIGHT.  FOOT STARTING TO SWELL .  HURTS TO BEND TOE.   TOE IS NOT WARM OR PAINFUL TO PALPATE.   STRESS:   SEPARATE HUSBAND VINNIE HAS BEEN IN [facility] SINCE [facility] ADMISSION LATE NOVEMBER.  TRYING TO GET HIM PLACED ,  FRONTAL LABE DEMENTIA .  HE IS A 2 PERSON ASSIST    Outpatient Medications Prior to Visit  Medication Sig Dispense Refill   acetaminophen  (TYLENOL ) 500 MG tablet Take 500 mg by mouth daily as needed for pain.     aspirin  EC 81 MG tablet Take 81 mg by mouth daily.     cholecalciferol (VITAMIN D3) 25 MCG (1000 UNIT) tablet Take 1,000 Units by mouth daily.     colchicine  0.6 MG tablet TAKE 1 TABLET (0.6 MG TOTAL) BY MOUTH 2 (TWO) TIMES DAILY AS NEEDED. 180 tablet 0   cyanocobalamin (VITAMIN B12) 1000 MCG tablet Take 1,000 mcg by mouth daily.     esomeprazole  (NEXIUM ) 40 MG capsule TAKE 1 CAPSULE (40 MG TOTAL) BY MOUTH DAILY. 90 capsule 1   Evolocumab  (REPATHA  SURECLICK) 140 MG/ML SOAJ INJECT 140 MG INTO THE SKIN EVERY 14 (FOURTEEN) DAYS. 6 mL 3   ezetimibe  (ZETIA ) 10 MG tablet Take  1 tablet (10 mg total) by mouth daily. 90 tablet 3   famotidine  (PEPCID ) 20 MG tablet TAKE 1 TABLET BY MOUTH EVERYDAY AT BEDTIME 90 tablet 0   Fexofenadine HCl (ALLEGRA PO) Take 1 tablet by mouth every morning.     Flaxseed, Linseed, (FLAX SEED OIL) 1000 MG CAPS Take 1 capsule by mouth at bedtime.     furosemide  (LASIX ) 20 MG tablet TAKE 1 TABLET BY MOUTH EVERY DAY AS NEEDED 90 tablet 3   ipratropium (ATROVENT ) 0.06 % nasal spray USE 1 SPRAY IN EACH NOSTRIL 1-2 TIMES DAILY AS NEEDED TO CONTROL DRAINAGE 45 mL 0   levalbuterol  (XOPENEX  HFA) 45 MCG/ACT inhaler Inhale 2 puffs into the lungs every 4 (four) hours as needed for wheezing. 15 g 1   magnesium  gluconate (MAGONATE) 500 MG tablet Take 500 mg by mouth at bedtime.     MELATONIN ER PO Take 6 mg by mouth at bedtime.     metoprolol  tartrate (LOPRESSOR ) 25 MG tablet Take 1 tablet (25 mg total) by mouth 2 (two) times daily. 60 tablet 11   nitroGLYCERIN  (NITROSTAT ) 0.4 MG SL tablet Place 1 tablet (0.4 mg total) under the tongue every 5 (five) minutes as needed  for chest pain. 50 tablet 3   Omega-3 Fatty Acids (OMEGA-3 FISH OIL PO) Take 1 capsule by mouth daily.     ondansetron  (ZOFRAN  ODT) 4 MG disintegrating tablet Take 1 tablet (4 mg total) by mouth every 8 (eight) hours as needed for nausea or vomiting. 20 tablet 0   potassium chloride  (KLOR-CON ) 10 MEQ tablet TAKE 1 TABLET (10 MEQ TOTAL) BY MOUTH DAILY AS NEEDED. 90 tablet 3   prasugrel  (EFFIENT ) 10 MG TABS tablet Take 1 tablet (10 mg total) by mouth daily. 90 tablet 3   promethazine  (PHENERGAN ) 12.5 MG tablet Take 12.5 mg by mouth every 6 (six) hours as needed for nausea or vomiting.     sucralfate  (CARAFATE ) 1 g tablet Take 1 tablet (1 g total) by mouth 2 (two) times daily. Place 1 tablet in 10 ml of warm water  and drink twice daily as needed. 60 tablet 2   valsartan  (DIOVAN ) 40 MG tablet TAKE 1 TABLET BY MOUTH EVERY DAY 90 tablet 4   vitamin C (ASCORBIC ACID) 500 MG tablet Take 1,000 mg by mouth  daily.     zinc  gluconate 50 MG tablet Take 50 mg by mouth daily.     zinc  oxide (MEIJER ZINC  OXIDE) 20 % ointment Apply 1 Application topically 2 (two) times daily as needed for irritation (to lip corners). 56.7 g 0   No facility-administered medications prior to visit.    Review of Systems;  Patient denies headache, fevers, malaise, unintentional weight loss, skin rash, eye pain, sinus congestion and sinus pain, sore throat, dysphagia,  hemoptysis , cough, dyspnea, wheezing, chest pain, palpitations, orthopnea, edema, abdominal pain, nausea, melena, diarrhea, constipation, flank pain, dysuria, hematuria, urinary  Frequency, nocturia, numbness, tingling, seizures,  Focal weakness, Loss of consciousness,  Tremor, insomnia, depression, anxiety, and suicidal ideation.      Objective:  BP 122/78 (BP Location: Left Arm)   Pulse 67   Temp 98.1 F (36.7 C)   Ht 5' 5 (1.651 m)   Wt 214 lb 9.6 oz (97.3 kg)   SpO2 97%   BMI 35.71 kg/m   BP Readings from Last 3 Encounters:  06/15/24 122/78  05/29/24 118/60  05/16/24 130/62    Wt Readings from Last 3 Encounters:  06/15/24 214 lb 9.6 oz (97.3 kg)  05/29/24 212 lb 9.6 oz (96.4 kg)  05/15/24 214 lb 12.8 oz (97.4 kg)    Physical Exam Vitals reviewed.  Constitutional:      General: Tracey Wiley is not in acute distress.    Appearance: Normal appearance. Tracey Wiley is normal weight. Tracey Wiley is not ill-appearing, toxic-appearing or diaphoretic.  HENT:     Head: Normocephalic.  Eyes:     General: No scleral icterus.       Right eye: No discharge.        Left eye: No discharge.     Conjunctiva/sclera: Conjunctivae normal.  Cardiovascular:     Rate and Rhythm: Normal rate and regular rhythm.     Heart sounds: Normal heart sounds.  Pulmonary:     Effort: Pulmonary effort is normal. No respiratory distress.     Breath sounds: Normal breath sounds.  Musculoskeletal:        General: Normal range of motion.  Skin:    General: Skin is warm and dry.   Neurological:     General: No focal deficit present.     Mental Status: Tracey Wiley is alert and oriented to person, place, and time. Mental status is at baseline.  Psychiatric:  Mood and Affect: Mood normal.        Behavior: Behavior normal.        Thought Content: Thought content normal.        Judgment: Judgment normal.     Lab Results  Component Value Date   HGBA1C 6.0 (H) 06/15/2024   HGBA1C 5.9 (H) 01/26/2024   HGBA1C 6.1 (H) 01/07/2024    Lab Results  Component Value Date   CREATININE 0.69 05/15/2024   CREATININE 0.70 03/02/2024   CREATININE 0.79 01/26/2024    Lab Results  Component Value Date   WBC 9.2 06/15/2024   HGB 12.6 06/15/2024   HCT 40.5 06/15/2024   PLT 320 06/15/2024   GLUCOSE 99 05/15/2024   CHOL 114 03/02/2024   TRIG 131 03/02/2024   HDL 45 03/02/2024   LDLDIRECT 159 (H) 10/13/2022   LDLCALC 46 03/02/2024   ALT 11 05/15/2024   AST 18 05/15/2024   NA 141 05/15/2024   K 4.3 05/15/2024   CL 105 05/15/2024   CREATININE 0.69 05/15/2024   BUN 15 05/15/2024   CO2 30 05/15/2024   TSH 2.49 03/13/2024   HGBA1C 6.0 (H) 06/15/2024    DG ESOPHAGUS W SINGLE CM (SOL OR THIN BA) Result Date: 04/03/2024 CLINICAL DATA:  69 year old female with a history of GERD, well controlled on medication, esophageal strictures s/p multiple dilations, and worsening dysphagia for the past few months. Patient reports solid foods getting stuck in her mid chest, but denies any issues with liquids. Patient states that is insert cardiac catheterization in August Tracey Wiley is no longer a candidate for EGD with dilation. Barium swallow ordered to evaluate for possible dysmotility and stricture. EXAM: ESOPHAGUS/BARIUM SWALLOW/TABLET STUDY TECHNIQUE: Combined double and single contrast examination was performed using effervescent crystals, high-density barium, and thin liquid barium. This exam was performed by [facility] PA-C, and was supervised and interpreted by Dr. JINNY Hall.  FLUOROSCOPY: Radiation Exposure Index (as provided by the fluoroscopic device): 48.4 mGy Kerma COMPARISON:  None Available. FINDINGS: Swallowing: Appears normal. No vestibular penetration or aspiration seen. Pharynx: Unremarkable. Esophagus: Normal appearance. No obvious mucosal abnormality or visible ulceration. Esophageal motility: Mild esophageal dysmotility with intermittent proximal escape leading to delayed passage of contrast from the esophagus into the stomach. Hiatal Hernia: Small hiatal hernia. Gastroesophageal reflux: None visualized with coughing or Valsalva. Ingested 13mm barium tablet: Passed without difficulty into the stomach. Other: None. IMPRESSION: Mild esophageal dysmotility and a small hiatus hernia. No overt gastroesophageal reflux was noted on this evaluation. Electronically Signed   By: Thom Hall M.D.   On: 04/03/2024 17:59    Assessment & Plan:  .Pain of left great toe -     Sedimentation rate -     Uric acid -     CBC with Differential/Platelet -     DG Toe Great Left; Future  Prediabetes -     Hemoglobin A1c  Muscle pain Assessment & Plan: no evidence of myositis on December labs.    Myalgia due to statin Assessment & Plan: Tracey Wiley had myalgias on zocor , which was stopped in 2021 . Other statin trials (crestor , pituvastatin also not tolerated) . Tracey Wiley is tolerating Repatha     Gouty arthritis Assessment & Plan: Tracey Wiley has deferred preventive treatment with allopurinol.  Reviewed use of colchicine  current toe pain not consistent ith gout on exam.  But ESR is elevated. Will recommend trial of colchicine    Lab Results  Component Value Date   ESRSEDRATE 50 (H) 06/15/2024  Lab Results  Component Value Date   LABURIC 4.2 06/15/2024         I spent 34 minutes on the day of this face to face encounter reviewing patient's  most recent visit with rheumatology, orthopedics,   prior relevant surgical and non surgical procedures, recent  labs and imaging studies,  counseling on stress management,  reviewing the assessment and plan with patient, and post visit ordering and reviewing of  diagnostics and therapeutics with patient  .   Follow-up: Return in about 3 months (around 09/13/2024).   Verneita LITTIE Kettering, MD "

## 2024-06-15 NOTE — Patient Instructions (Addendum)
 Thank you for another beautiful blanket!!  [facility]  ON [address] IS NOT OPEN ON WEEKEND BT [facility] DOES .  LEFT GREAT TOE X RAY ORDERED   REPATHA  DOES NOT CAUSE DIABETES!

## 2024-06-16 LAB — CBC WITH DIFFERENTIAL/PLATELET
Basophils Absolute: 0.1 x10E3/uL (ref 0.0–0.2)
Basos: 1 %
EOS (ABSOLUTE): 0.2 x10E3/uL (ref 0.0–0.4)
Eos: 2 %
Hematocrit: 40.5 % (ref 34.0–46.6)
Hemoglobin: 12.6 g/dL (ref 11.1–15.9)
Immature Grans (Abs): 0 x10E3/uL (ref 0.0–0.1)
Immature Granulocytes: 0 %
Lymphocytes Absolute: 2.8 x10E3/uL (ref 0.7–3.1)
Lymphs: 30 %
MCH: 26.8 pg (ref 26.6–33.0)
MCHC: 31.1 g/dL — ABNORMAL LOW (ref 31.5–35.7)
MCV: 86 fL (ref 79–97)
Monocytes Absolute: 0.5 x10E3/uL (ref 0.1–0.9)
Monocytes: 6 %
Neutrophils Absolute: 5.6 x10E3/uL (ref 1.4–7.0)
Neutrophils: 61 %
Platelets: 320 x10E3/uL (ref 150–450)
RBC: 4.71 x10E6/uL (ref 3.77–5.28)
RDW: 14.4 % (ref 11.7–15.4)
WBC: 9.2 x10E3/uL (ref 3.4–10.8)

## 2024-06-16 LAB — SEDIMENTATION RATE: Sed Rate: 50 mm/h — ABNORMAL HIGH (ref 0–40)

## 2024-06-16 LAB — URIC ACID: Uric Acid: 4.2 mg/dL (ref 3.0–7.2)

## 2024-06-16 LAB — HEMOGLOBIN A1C
Est. average glucose Bld gHb Est-mCnc: 126 mg/dL
Hgb A1c MFr Bld: 6 % — ABNORMAL HIGH (ref 4.8–5.6)

## 2024-06-16 NOTE — Assessment & Plan Note (Signed)
 She had myalgias on zocor , which was stopped in 2021 . Other statin trials (crestor , pituvastatin also not tolerated) . She is tolerating Repatha 

## 2024-06-16 NOTE — Assessment & Plan Note (Signed)
 no evidence of myositis on December labs.

## 2024-06-16 NOTE — Assessment & Plan Note (Signed)
 She has deferred preventive treatment with allopurinol.  Reviewed use of colchicine  current toe pain not consistent ith gout on exam.  But ESR is elevated. Will recommend trial of colchicine    Lab Results  Component Value Date   ESRSEDRATE 50 (H) 06/15/2024    Lab Results  Component Value Date   LABURIC 4.2 06/15/2024

## 2024-06-17 ENCOUNTER — Ambulatory Visit: Payer: Self-pay | Admitting: Internal Medicine

## 2024-06-18 ENCOUNTER — Encounter
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
## 2024-06-19 ENCOUNTER — Ambulatory Visit: Admitting: Occupational Therapy

## 2024-06-19 ENCOUNTER — Encounter: Payer: Self-pay | Admitting: Occupational Therapy

## 2024-06-19 DIAGNOSIS — M150 Primary generalized (osteo)arthritis: Secondary | ICD-10-CM | POA: Diagnosis present

## 2024-06-19 DIAGNOSIS — M158 Other polyosteoarthritis: Secondary | ICD-10-CM | POA: Insufficient documentation

## 2024-06-19 DIAGNOSIS — M25649 Stiffness of unspecified hand, not elsewhere classified: Secondary | ICD-10-CM | POA: Insufficient documentation

## 2024-06-19 DIAGNOSIS — M79652 Pain in left thigh: Secondary | ICD-10-CM | POA: Diagnosis present

## 2024-06-19 DIAGNOSIS — M6281 Muscle weakness (generalized): Secondary | ICD-10-CM | POA: Insufficient documentation

## 2024-06-19 DIAGNOSIS — M79651 Pain in right thigh: Secondary | ICD-10-CM | POA: Insufficient documentation

## 2024-06-20 ENCOUNTER — Encounter: Attending: Cardiovascular Disease

## 2024-06-20 DIAGNOSIS — Z955 Presence of coronary angioplasty implant and graft: Secondary | ICD-10-CM | POA: Insufficient documentation

## 2024-06-20 DIAGNOSIS — I214 Non-ST elevation (NSTEMI) myocardial infarction: Secondary | ICD-10-CM

## 2024-06-20 DIAGNOSIS — Z48812 Encounter for surgical aftercare following surgery on the circulatory system: Secondary | ICD-10-CM | POA: Insufficient documentation

## 2024-06-20 NOTE — Progress Notes (Signed)
 Daily Session Note  Patient Details  Name: Tracey Wiley MRN: [id number] Date of Birth: 06-Sep-1955 Referring Provider:   Flowsheet Row Cardiac Rehab from 01/31/2024 in [facility]  Referring Provider Dr. Evalene Lunger    Encounter Date: 06/20/2024  Check In:  Session Check In - 06/20/24 1405       Check-In   Supervising physician immediately available to respond to emergencies See telemetry face sheet for immediately available ER MD    Location [facility]    Staff Present Burnard Davenport RN,BSN,MPA;Laura Cates RN,BSN;Noah Laird, BS, Exercise Physiologist;Samiyah Stupka Dyane HECKLE, ACSM CEP, Exercise Physiologist    Virtual Visit No    Medication changes reported     No    Fall or balance concerns reported    No    Tobacco Cessation No Change    Warm-up and Cool-down Performed on first and last piece of equipment    Resistance Training Performed Yes    VAD Patient? No    PAD/SET Patient? No      Pain Assessment   Currently in Pain? No/denies             Tobacco Use History[1]  Goals Met:  Independence with exercise equipment Exercise tolerated well No report of concerns or symptoms today Strength training completed today  Goals Unmet:  Not Applicable  Comments: Pt able to follow exercise prescription today without complaint.  Will continue to monitor for progression.    Dr. Oneil Pinal is Medical Director for [facility].  Dr. Fuad Aleskerov is Medical Director for [facility].    [1]  Social History Tobacco Use  Smoking Status Never  Smokeless Tobacco Never

## 2024-06-20 NOTE — Progress Notes (Signed)
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 progress in the program.      Expected Outcomes Short: Increase to level 2 on T4 and T5 nustep. Long: Continue exercise to improve strength and stamina. Short: Attend rehab more consistently. Long: Continue exercise to improve strength and stamina.         Discharge Exercise Prescription (Final Exercise Prescription Changes):  Exercise Prescription Changes - 06/12/24 1300       Response to Exercise   Blood Pressure (Admit) 122/70    Blood Pressure (Exit) 118/70    Heart Rate (Admit) 69 bpm    Heart Rate (Exercise) 88 bpm    Heart Rate (Exit) 65 bpm    Rating of Perceived Exertion (Exercise) 12    Symptoms none    Duration Continue with 30 min of aerobic exercise without signs/symptoms of physical distress.    Intensity THRR unchanged      Progression   Progression Continue to progress workloads to maintain intensity without signs/symptoms of physical distress.    Average METs 2.9      Resistance Training   Weight 4 lb     Reps 10-15      Interval Training   Interval Training No      Recumbant Bike   Level 2    Watts 29    Minutes 15    METs 2.91      NuStep   Level 1    Minutes 15      Home Exercise Plan   Plans to continue exercise at [employer] (comment)   Slow and Steady Fitness   Frequency Add 2 additional days to program exercise sessions.    Initial Home Exercises Provided 03/05/24      Oxygen   Maintain Oxygen Saturation 88% or higher          Nutrition:  Target Goals: Understanding of nutrition guidelines, daily intake of sodium 1500mg ,  cholesterol 200mg , calories 30% from fat and 7% or less from saturated fats, daily to have 5 or more servings of fruits and vegetables.  Education: Nutrition 1 -Group instruction provided by verbal, written material, interactive activities, discussions, models, and posters to present general guidelines for heart healthy nutrition including macronutrients, label reading, and promoting whole foods over processed counterparts. Education serves as pensions consultant of discussion of heart healthy eating for all. Written material provided at class time.    Education: Nutrition 2 -Group instruction provided by verbal, written material, interactive activities, discussions, models, and posters to present general guidelines for heart healthy nutrition including sodium, cholesterol, and saturated fat. Providing guidance of habit forming to improve blood pressure, cholesterol, and body weight. Written material provided at class time.     Biometrics:  Pre Biometrics - 01/31/24 1513       Pre Biometrics   Height 5' 4.8 (1.646 m)    Weight 215 lb 9.6 oz (97.8 kg)    Waist Circumference 45.5 inches    Hip Circumference 48 inches    Waist to Hip Ratio 0.95 %    BMI (Calculated) 36.1    Single Leg Stand 30 seconds           Nutrition Therapy Plan and Nutrition Goals:  Nutrition Therapy & Goals - 03/12/24 1555       Nutrition Therapy   Diet  Cardiac, Low Na    Protein (specify units) 80-90    Fiber 25 grams    Whole Grain Foods 3 servings    Saturated Fats 15 max. grams    Fruits and Vegetables 5 servings/day    Sodium 2 grams      Personal Nutrition Goals   Nutrition Goal Read labels and reduce sodium intake to below 2300mg . Ideally 1500mg  per day.    Personal Goal #2 Reduce saturated fat, less than 12g per day. Replace bad fats for more heart healthy fats.    Personal Goal #3 Eat 15-30gProtein and 30-60gCarbs at each meal.    Comments Kian drinks mostly water . She eats breakfast late and usually skips lunch or will have a snack. She likes fruit as a snack. Also eating RX bars as snacks when on the go. Reviewed Rx bar labels and discussed what makes a food heart healthy or not. Educated on carbs per meals to help manage blood sugars and control A1C. Encouraged her to pair carbs with protein and healthy fats. Provided Mediterranean diet handout. Educated on types of fats, sources, and how to read labels to find unmarked nutrients like healthy unsaturated fats. Provided guideline limits of less than 1500mg  sodium and less than 12g saturated fat.      Intervention Plan   Intervention Prescribe, educate and counsel regarding individualized specific dietary modifications aiming towards targeted core components such as weight, hypertension, lipid management, diabetes, heart failure and other comorbidities.;Nutrition handout(s) given to patient.    Expected Outcomes Long Term Goal: Adherence to prescribed nutrition plan.;Short Term Goal: A plan has been developed with personal nutrition goals set during dietitian appointment.;Short Term Goal: Understand basic principles of dietary content, such as calories, fat, sodium, cholesterol and nutrients.          Nutrition Assessments:  MEDIFICTS Score Key: >=70 Need to make dietary changes  40-70 Heart Healthy Diet <= 40 Therapeutic Level Cholesterol Diet  Flowsheet Row Cardiac Rehab from  01/31/2024 in [facility]  Picture Your Plate Total Score on Admission 71   Picture Your Plate  Scores: <40 Unhealthy dietary pattern with much room for improvement. 41-50 Dietary pattern unlikely to meet recommendations for good health and room for improvement. 51-60 More healthful dietary pattern, with some room for improvement.  >60 Healthy dietary pattern, although there may be some specific behaviors that could be improved.    Nutrition Goals Re-Evaluation:  Nutrition Goals Re-Evaluation     Row Name 04/16/24 1403 05/09/24 1428           Goals   Comment Tracey Wiley continues to use the information provided by the RD to better read food labels. She eats takeout about 3 times a week, but tries to watch her sodium intake. Tracey Wiley is reading food labels for the things she buys. Her husband is the one who cooks so she is not always sure how much sodium is in her food when she does not cook. She continues to eat a protein bar each day that she discussed with RD and he said it was a good protein bar. She is trying to pair fruit with protein in order to help control blood sugars.      Expected Outcome Short: Continue to read food labels, especially with takeout. Long: Continue focus on cooking at home and limiting takeout food. Short: continue to read food labels and reduce sodium. Continue to balance carbs and protein to help control blood sugars as a pre-diabetic.         Nutrition Goals Discharge (Final Nutrition Goals Re-Evaluation):  Nutrition Goals Re-Evaluation - 05/09/24 1428       Goals   Comment Magdalyn is reading food labels for the things she buys. Her husband is the one who cooks so she is not always sure how much sodium is in her food when she does not cook. She continues to eat a protein bar each day that she discussed with RD and he said it was a good protein bar. She is trying to pair fruit with protein in order to help control blood sugars.    Expected Outcome Short:  continue to read food labels and reduce sodium. Continue to balance carbs and protein to help control blood sugars as a pre-diabetic.          Psychosocial: Target Goals: Acknowledge presence or absence of significant depression and/or stress, maximize coping skills, provide positive support system. Participant is able to verbalize types and ability to use techniques and skills needed for reducing stress and depression.   Education: Stress, Anxiety, and Depression - Group verbal and visual presentation to define topics covered.  Reviews how body is impacted by stress, anxiety, and depression.  Also discusses healthy ways to reduce stress and to treat/manage anxiety and depression. Written material provided at class time.   Education: Sleep Hygiene -Provides group verbal and written instruction about how sleep can affect your health.  Define sleep hygiene, discuss sleep cycles and impact of sleep habits. Review good sleep hygiene tips.   Initial Review & Psychosocial Screening:  Initial Psych Review & Screening - 01/27/24 1528       Initial Review   Current issues with Current Stress Concerns;Current Anxiety/Panic    Source of Stress Concerns Chronic Illness      Family Dynamics   Good Support System? Yes   fiance     Barriers   Psychosocial barriers to participate in program There are no identifiable barriers or psychosocial needs.      Screening Interventions   Interventions Encouraged to exercise;Provide feedback about the scores to participant;To provide  support and resources with identified psychosocial needs    Expected Outcomes Long Term Goal: Stressors or current issues are controlled or eliminated.;Short Term goal: Utilizing psychosocial counselor, staff and physician to assist with identification of specific Stressors or current issues interfering with healing process. Setting desired goal for each stressor or current issue identified.;Short Term goal: Identification and  review with participant of any Quality of Life or Depression concerns found by scoring the questionnaire.;Long Term goal: The participant improves quality of Life and PHQ9 Scores as seen by post scores and/or verbalization of changes          Quality of Life Scores:   Quality of Life - 01/31/24 1514       Quality of Life   Select Quality of Life      Quality of Life Scores   Health/Function Pre 13.27 %    Socioeconomic Pre 17.88 %    Psych/Spiritual Pre 18.07 %    Family Pre 18.5 %    GLOBAL Pre 16.03 %         Scores of 19 and below usually indicate a poorer quality of life in these areas.  A difference of  2-3 points is a clinically meaningful difference.  A difference of 2-3 points in the total score of the Quality of Life Index has been associated with significant improvement in overall quality of life, self-image, physical symptoms, and general health in studies assessing change in quality of life.  PHQ-9: Review Flowsheet  More data exists      05/15/2024 04/16/2024 04/03/2024 03/02/2024 02/27/2024  Depression screen PHQ 2/9  Decreased Interest 0 1 0 0 0  Down, Depressed, Hopeless 1 1 1 1 1   PHQ - 2 Score 1 2 1 1 1   Altered sleeping 0 0 1 1 2   Tired, decreased energy 1 0 2 1 1   Change in appetite 0 0 0 0 0  Feeling bad or failure about yourself  0 0 0 0 0  Trouble concentrating 1 1 0 1 1  Moving slowly or fidgety/restless 0 0 0 0 0  Suicidal thoughts 0 0 0 0 0  PHQ-9 Score 3 3 4  4  5    Difficult doing work/chores Somewhat difficult Somewhat difficult Somewhat difficult Somewhat difficult Somewhat difficult    Details       Data saved with a previous flowsheet row definition        Interpretation of Total Score  Total Score Depression Severity:  1-4 = Minimal depression, 5-9 = Mild depression, 10-14 = Moderate depression, 15-19 = Moderately severe depression, 20-27 = Severe depression   Psychosocial Evaluation and Intervention:  Psychosocial Evaluation -  01/27/24 1555       Psychosocial Evaluation & Interventions   Interventions Encouraged to exercise with the program and follow exercise prescription;Stress management education;Relaxation education    Comments Ms. 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 This came as a surprise to her as she had been quite active in physical therapy related to her autoimmune disease/chronic vertigo.  She states she is frustrated that this happened because she has taken care of her body and now she is having to reschedule appointments she had planned for months (like endoscopy, dentist, dermatology, etc). She has an undiagnosed autoimmune disease that doctors have tried to diagnose, but now are focusing on treating the symptoms. She mentions that in addition to her own stress, her fiance has been having to deal with all that happened with her  MI and seeing her in an ambulance, having a vasovagal response post stent, and the hospital stay in general. They want to move forward but  there is some fear there as to what the new normal is going to be. She meets with a therapist as well. 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 She is interested in learning more about a healthy cardiac lifestyle and is committed to attending the program    Expected Outcomes Short: attend cardiac rehab for educaiton and exercise Long: develop and maintain positive self care habits    Continue Psychosocial Services  Follow up required by staff          Psychosocial Re-Evaluation:  Psychosocial Re-Evaluation     Row Name 02/27/24 1700 04/16/24 1353 05/09/24 1436         Psychosocial Re-Evaluation   Current issues with Current Stress Concerns Current Stress Concerns Current Stress Concerns     Comments Tracey Wiley has a lot of stressors in her life. 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 This  causes significant stress in her life as she takes care of him every day and she does not get along with him. 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 She tries to find time for herself when she can between work, appointments, and caretaking. She is wanting to move forward with her life in the process of getting married to her fiance, but is overwhelmed and stressed from all of her responsibilities and recent health events to add wedding planning to the list. Her only support here is her fiance. She does see a psychotherapist weekly to help work on her stress concerns. She states she does not know how to deal with her stress as she just goes right back to work. We discussed ways to help reduce her stress daily. She practices diaphragmatic breathing her psychotherapist taught her and she is enjoying coming to rehab as it makes her stop and rest when exercising. We discussed these extra benefits regular exercise can give with dealing with stress. We reassessed her PHQ and her score went down to 5. Tracey Wiley is still dealing with some stress. She continues to be the primary caregiver to her ex-husband, and that continues to be the main source of her stress. She feels stretched thin and states that his needs continue to grow, and time for herself becomes less. She has a great support system made up of her husband, and her 2 counselors that she sees once a week, and she continues to exercise in the program and outside for stress relief. Tracey Wiley states that she has no concerns with her sleep, she does state that she wakes up with some occasional arthritic pain, but is able to go back to sleep. Tracey Wiley reports that she is sleeping better at night. She had her heart event at night and for a while it cause a bit of anxiety to try to fall asleep, but that has improved. Her stress related to caregiving for her ex-husband continues but she does continue to meet with her 2 councelors  weeking to work on coping and her current husband is a great support.     Expected Outcomes Short: Work on finding ways to reduce stress and cope with her multitude of stress throughout the day.  Long: Coninue to attend Cardiac Rehabilitiation for the benefits of exercise on stress. Short: Continue to lean on support system to reduce stress, add exercise days to focus on herself. Long: Continue to attend cardiac rehabilitation for the benefits in stress relief. Short: continue to meet with councelors to work on coping with stress levels. Long: maintain good mental health routine.     Interventions Stress management education;Encouraged to attend Cardiac Rehabilitation for the exercise Encouraged to attend Cardiac Rehabilitation for the exercise Encouraged to attend Cardiac Rehabilitation for the exercise     Continue Psychosocial Services  Follow up required by staff Follow up required by staff Follow up required by staff        Psychosocial Discharge (Final Psychosocial Re-Evaluation):  Psychosocial Re-Evaluation - 05/09/24 1436       Psychosocial Re-Evaluation   Current issues with Current Stress Concerns    Comments Tracey Wiley reports that she is sleeping better at night. She had her heart event at night and for a while it cause a bit of anxiety to try to fall asleep, but that has improved. Her stress related to caregiving for her ex-husband continues but she does continue to meet with her 2 councelors weeking to work on coping and her current husband is a great support.    Expected Outcomes Short: continue to meet with councelors to work on coping with stress levels. Long: maintain good mental health routine.    Interventions Encouraged to attend Cardiac Rehabilitation for the exercise    Continue Psychosocial Services  Follow up required by staff          Vocational Rehabilitation: Provide vocational rehab assistance to qualifying candidates.   Vocational Rehab Evaluation & Intervention:   Vocational Rehab - 01/27/24 1528       Initial Vocational Rehab Evaluation & Intervention   Assessment shows need for Vocational Rehabilitation No          Education: Education Goals: Education classes will be provided on a variety of topics geared toward better understanding of heart health and risk factor modification. Participant will state understanding/return demonstration of topics presented as noted by education test scores.  Learning Barriers/Preferences:  Learning Barriers/Preferences - 01/27/24 1528       Learning Barriers/Preferences   Learning Barriers None    Learning Preferences None          General Cardiac Education Topics:  AED/CPR: - Group verbal and written instruction with the use of models to demonstrate the basic use of the AED with the basic ABC's of resuscitation.   Test and Procedures: - Group verbal and visual presentation and models provide information about basic cardiac anatomy and function. Reviews the testing methods done to diagnose heart disease and the outcomes of the test results. Describes the treatment choices: Medical Management, Angioplasty, or Coronary Bypass Surgery for treating various heart conditions including Myocardial Infarction, Angina, Valve Disease, and Cardiac Arrhythmias. Written material provided at class time. Flowsheet Row Cardiac Rehab from 04/11/2024 in [facility] Cardiac and Pulmonary Rehab  Date 04/11/24  Educator lc  Instruction Review Code 1- Verbalizes Understanding    Medication Safety: - Group verbal and visual instruction to review commonly prescribed medications for heart and lung disease. Reviews the medication, class of the drug, and side effects. Includes the steps to properly store meds and maintain the prescription regimen. Written material provided at class time. Flowsheet Row Cardiac Rehab from 04/11/2024 in [facility] Cardiac and Pulmonary Rehab  Education need identified 01/31/24    Intimacy: -  Group verbal  instruction through game format to discuss how heart and lung disease can affect sexual intimacy. Written material provided at class time.   Know Your Numbers and Heart Failure: - Group verbal and visual instruction to discuss disease risk factors for cardiac and pulmonary disease and treatment options.  Reviews associated critical values for Overweight/Obesity, Hypertension, Cholesterol, and Diabetes.  Discusses basics of heart failure: signs/symptoms and treatments.  Introduces Heart Failure Zone chart for action plan for heart failure. Written material provided at class time.   Infection Prevention: - Provides verbal and written material to individual with discussion of infection control including proper hand washing and proper equipment cleaning during exercise session. Flowsheet Row Cardiac Rehab from 04/11/2024 in [facility] Cardiac and Pulmonary Rehab  Date 01/31/24  Educator [facility]  Instruction Review Code 1- Verbalizes Understanding    Falls Prevention: - Provides verbal and written material to individual with discussion of falls prevention and safety. Flowsheet Row Cardiac Rehab from 04/11/2024 in [facility] Cardiac and Pulmonary Rehab  Date 01/31/24  Educator [facility]  Instruction Review Code 1- Verbalizes Understanding    Other: -Provides group and verbal instruction on various topics (see comments)   Knowledge Questionnaire Score:  Knowledge Questionnaire Score - 01/31/24 1515       Knowledge Questionnaire Score   Pre Score 22/26          Core Components/Risk Factors/Patient Goals at Admission:  Personal Goals and Risk Factors at Admission - 01/27/24 1526       Core Components/Risk Factors/Patient Goals on Admission   Hypertension Yes    Intervention Provide education on lifestyle modifcations including regular physical activity/exercise, weight management, moderate sodium restriction and increased consumption of fresh fruit, vegetables, and low fat dairy, alcohol moderation, and  smoking cessation.;Monitor prescription use compliance.    Expected Outcomes Short Term: Continued assessment and intervention until BP is < 140/32mm HG in hypertensive participants. < 130/32mm HG in hypertensive participants with diabetes, heart failure or chronic kidney disease.;Long Term: Maintenance of blood pressure at goal levels.    Lipids Yes    Intervention Provide education and support for participant on nutrition & aerobic/resistive exercise along with prescribed medications to achieve LDL 70mg , HDL >40mg .    Expected Outcomes Short Term: Participant states understanding of desired cholesterol values and is compliant with medications prescribed. Participant is following exercise prescription and nutrition guidelines.;Long Term: Cholesterol controlled with medications as prescribed, with individualized exercise RX and with personalized nutrition plan. Value goals: LDL < 70mg , HDL > 40 mg.          Education:Diabetes - Individual verbal and written instruction to review signs/symptoms of diabetes, desired ranges of glucose level fasting, after meals and with exercise. Acknowledge that pre and post exercise glucose checks will be done for 3 sessions at entry of program.   Core Components/Risk Factors/Patient Goals Review:   Goals and Risk Factor Review     Row Name 02/27/24 1712 04/16/24 1411 05/09/24 1435         Core Components/Risk Factors/Patient Goals Review   Personal Goals Review Hypertension Hypertension Lipids;Hypertension     Review Tracey Wiley states she is not checking her blood pressure regularly and we discussed adding that into her morning routine and she plans to after a few minutes of rest in the morning after she checks her weight. She is taking her hypertension medication regularly and is very disciplined with all her medications. Tracey Wiley is not taking her BP at home. Discussed the importance of regular BP  checks at home in addition to days she attends the program. She states  that she is taking her medication regularly, but still states that her BP fluctuates. Tracey Wiley reports that she is not yet taking BP at home, but that she weighs every day and is planning to put her BP cuff next to the scale as a reminder to check it every day. She takes all her medication for BP and cholesterol and follows up with her doctor for required lab work to monitor risk factors.     Expected Outcomes Short: Start checking blood pressure in the morning. Long: Continue managing hypertension and working on a heart healthy lifestyle. Short: Start checking BP every other day, work towards every day. Long: Continue to manage hypertension with medication and a heart healthy lifestyle. Short: start checking BP at home. Long: continue to monitor and control cardic risk factors.        Core Components/Risk Factors/Patient Goals at Discharge (Final Review):   Goals and Risk Factor Review - 05/09/24 1435       Core Components/Risk Factors/Patient Goals Review   Personal Goals Review Lipids;Hypertension    Review Tracey Wiley reports that she is not yet taking BP at home, but that she weighs every day and is planning to put her BP cuff next to the scale as a reminder to check it every day. She takes all her medication for BP and cholesterol and follows up with her doctor for required lab work to monitor risk factors.    Expected Outcomes Short: start checking BP at home. Long: continue to monitor and control cardic risk factors.          ITP Comments:  ITP Comments     Row Name 01/27/24 1541 01/31/24 1509 02/01/24 0749 02/20/24 1359 02/29/24 1156   ITP Comments Initial phone call completed. Diagnosis can be found in [facility] 8/2. EP Orientation scheduled for Tuesday 8/26 at 1:30. Completed and gym orientation for cardiac rehab. Initial ITP created and sent for review to Dr. Oneil Pinal, Medical Director. 30 Day review completed. Medical Director ITP review done, changes made as directed, and signed approval by  Medical Director. New to Program. First full day of exercise!  Patient was oriented to gym and equipment including functions, settings, policies, and procedures.  Patient's individual exercise prescription and treatment plan were reviewed.  All starting workloads were established based on the results of the 6 minute walk test done at initial orientation visit.  The plan for exercise progression was also introduced and progression will be customized based on patient's performance and goals. 30 Day review completed. Medical Director ITP review done, changes made as directed, and signed approval by Medical Director. New to program    Row Name 03/28/24 0952 04/25/24 1136 05/23/24 1332 06/20/24 1047     ITP Comments 30 Day review completed. Medical Director ITP review done, changes made as directed, and signed approval by Medical Director. 30 Day review completed. Medical Director ITP review done, changes made as directed, and signed approval by Medical Director. 30 Day review completed. Medical Director ITP review done, changes made as directed, and signed approval by Medical Director. 30 Day review completed. Medical Director ITP review done, changes made as directed, and signed approval by Medical Director.       Comments: 30 day review     [1]  Current Outpatient Medications:    acetaminophen  (TYLENOL ) 500 MG tablet, Take 500 mg by mouth daily as needed for pain., Disp: ,  Rfl:    aspirin  EC 81 MG tablet, Take 81 mg by mouth daily., Disp: , Rfl:    cholecalciferol (VITAMIN D3) 25 MCG (1000 UNIT) tablet, Take 1,000 Units by mouth daily., Disp: , Rfl:    colchicine  0.6 MG tablet, TAKE 1 TABLET (0.6 MG TOTAL) BY MOUTH 2 (TWO) TIMES DAILY AS NEEDED., Disp: 180 tablet, Rfl: 0   cyanocobalamin (VITAMIN B12) 1000 MCG tablet, Take 1,000 mcg by mouth daily., Disp: , Rfl:    esomeprazole  (NEXIUM ) 40 MG capsule, TAKE 1 CAPSULE (40 MG TOTAL) BY MOUTH DAILY., Disp: 90 capsule, Rfl: 1   Evolocumab  (REPATHA   SURECLICK) 140 MG/ML SOAJ, INJECT 140 MG INTO THE SKIN EVERY 14 (FOURTEEN) DAYS., Disp: 6 mL, Rfl: 3   ezetimibe  (ZETIA ) 10 MG tablet, Take 1 tablet (10 mg total) by mouth daily., Disp: 90 tablet, Rfl: 3   famotidine  (PEPCID ) 20 MG tablet, TAKE 1 TABLET BY MOUTH EVERYDAY AT BEDTIME, Disp: 90 tablet, Rfl: 0   Fexofenadine HCl (ALLEGRA PO), Take 1 tablet by mouth every morning., Disp: , Rfl:    Flaxseed, Linseed, (FLAX SEED OIL) 1000 MG CAPS, Take 1 capsule by mouth at bedtime., Disp: , Rfl:    furosemide  (LASIX ) 20 MG tablet, TAKE 1 TABLET BY MOUTH EVERY DAY AS NEEDED, Disp: 90 tablet, Rfl: 3   ipratropium (ATROVENT ) 0.06 % nasal spray, USE 1 SPRAY IN EACH NOSTRIL 1-2 TIMES DAILY AS NEEDED TO CONTROL DRAINAGE, Disp: 45 mL, Rfl: 0   levalbuterol  (XOPENEX  HFA) 45 MCG/ACT inhaler, Inhale 2 puffs into the lungs every 4 (four) hours as needed for wheezing., Disp: 15 g, Rfl: 1   magnesium  gluconate (MAGONATE) 500 MG tablet, Take 500 mg by mouth at bedtime., Disp: , Rfl:    MELATONIN ER PO, Take 6 mg by mouth at bedtime., Disp: , Rfl:    metoprolol  tartrate (LOPRESSOR ) 25 MG tablet, Take 1 tablet (25 mg total) by mouth 2 (two) times daily., Disp: 60 tablet, Rfl: 11   nitroGLYCERIN  (NITROSTAT ) 0.4 MG SL tablet, Place 1 tablet (0.4 mg total) under the tongue every 5 (five) minutes as needed for chest pain., Disp: 50 tablet, Rfl: 3   Omega-3 Fatty Acids (OMEGA-3 FISH OIL PO), Take 1 capsule by mouth daily., Disp: , Rfl:    ondansetron  (ZOFRAN  ODT) 4 MG disintegrating tablet, Take 1 tablet (4 mg total) by mouth every 8 (eight) hours as needed for nausea or vomiting., Disp: 20 tablet, Rfl: 0   potassium chloride  (KLOR-CON ) 10 MEQ tablet, TAKE 1 TABLET (10 MEQ TOTAL) BY MOUTH DAILY AS NEEDED., Disp: 90 tablet, Rfl: 3   prasugrel  (EFFIENT ) 10 MG TABS tablet, Take 1 tablet (10 mg total) by mouth daily., Disp: 90 tablet, Rfl: 3   promethazine  (PHENERGAN ) 12.5 MG tablet, Take 12.5 mg by mouth every 6 (six) hours as  needed for nausea or vomiting., Disp: , Rfl:    sucralfate  (CARAFATE ) 1 g tablet, Take 1 tablet (1 g total) by mouth 2 (two) times daily. Place 1 tablet in 10 ml of warm water  and drink twice daily as needed., Disp: 60 tablet, Rfl: 2   valsartan  (DIOVAN ) 40 MG tablet, TAKE 1 TABLET BY MOUTH EVERY DAY, Disp: 90 tablet, Rfl: 4   vitamin C (ASCORBIC ACID) 500 MG tablet, Take 1,000 mg by mouth daily., Disp: , Rfl:    zinc  gluconate 50 MG tablet, Take 50 mg by mouth daily., Disp: , Rfl:    zinc  oxide (MEIJER ZINC  OXIDE) 20 % ointment,  Apply 1 Application topically 2 (two) times daily as needed for irritation (to lip corners)., Disp: 56.7 g, Rfl: 0 [2]  Social History Tobacco Use  Smoking Status Never  Smokeless Tobacco Never

## 2024-06-21 ENCOUNTER — Ambulatory Visit: Admitting: Clinical

## 2024-06-21 ENCOUNTER — Ambulatory Visit: Admitting: Occupational Therapy

## 2024-06-21 ENCOUNTER — Telehealth: Payer: Self-pay | Admitting: Cardiovascular Disease

## 2024-06-21 DIAGNOSIS — M6281 Muscle weakness (generalized): Secondary | ICD-10-CM

## 2024-06-21 DIAGNOSIS — M150 Primary generalized (osteo)arthritis: Secondary | ICD-10-CM | POA: Diagnosis not present

## 2024-06-21 DIAGNOSIS — M25649 Stiffness of unspecified hand, not elsewhere classified: Secondary | ICD-10-CM

## 2024-06-21 DIAGNOSIS — F4322 Adjustment disorder with anxiety: Secondary | ICD-10-CM
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
## 2024-06-21 NOTE — Telephone Encounter (Signed)
 Issues from topical steroids are seen with long term use not short term use. She should be fine without issues for concern. If she has other concerns related to steroid use she should reach out to her OT who prescribed the medication.

## 2024-06-21 NOTE — Telephone Encounter (Signed)
 Patient went to Occupational Therapy and states a steroid was applied to her wrist and she read that it causes blood clots and states she has a history of Heart attacks and wants to make sure the one time application on her skin would not affect her and if so what should she do. Please advise.

## 2024-06-22 NOTE — Telephone Encounter (Signed)
 Patient started topical steroid in second treatment at OT, patient stated she did not want to use it, then OT told her to reach out to cardiology to address her concerns. Patient stated that they manipulated her hand so much that it created pain and the steroid cream was the only solutions offered to her.  Patient upset that a MyChart was sent when she wanted a CB. I advised non-medical treatments for relieving pain such as warm soaks, alternating with ICE, and fidget balls to help return grip strength.

## 2024-06-25 ENCOUNTER — Ambulatory Visit: Admitting: Occupational Therapy

## 2024-06-25 ENCOUNTER — Encounter: Admitting: Emergency Medicine

## 2024-06-25 DIAGNOSIS — I214 Non-ST elevation (NSTEMI) myocardial infarction: Secondary | ICD-10-CM

## 2024-06-25 DIAGNOSIS — M6281 Muscle weakness (generalized): Secondary | ICD-10-CM

## 2024-06-25 DIAGNOSIS — M25649 Stiffness of unspecified hand, not elsewhere classified: Secondary | ICD-10-CM

## 2024-06-25 DIAGNOSIS — M150 Primary generalized (osteo)arthritis: Secondary | ICD-10-CM | POA: Diagnosis not present

## 2024-06-25 DIAGNOSIS — Z955 Presence of coronary angioplasty implant and graft: Secondary | ICD-10-CM
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
## 2024-06-25 NOTE — Progress Notes (Signed)
 Daily Session Note  Patient Details  Name: Tracey Wiley MRN: [id number] Date of Birth: [birth year] Referring Provider:   Flowsheet Row Cardiac Rehab from 01/31/2024 in [facility]  Referring Provider Dr. Evalene Lunger    Encounter Date: 06/25/2024  Check In:  Session Check In - 06/25/24 1346       Check-In   Supervising physician immediately available to respond to emergencies See telemetry face sheet for immediately available ER MD    Location [facility]    Staff Present Leita Franks RN,BSN;Maxon Burnell BS, Exercise Physiologist;Margaret Best, MS, Exercise Physiologist;Noah Tickle, BS, Exercise Physiologist    Virtual Visit No    Medication changes reported     No    Fall or balance concerns reported    No    Tobacco Cessation No Change    Warm-up and Cool-down Performed on first and last piece of equipment    Resistance Training Performed Yes    VAD Patient? No    PAD/SET Patient? No      Pain Assessment   Currently in Pain? No/denies             Tobacco Use History[1]  Goals Met:  Independence with exercise equipment Exercise tolerated well No report of concerns or symptoms today Strength training completed today  Goals Unmet:  Not Applicable  Comments: Pt able to follow exercise prescription today without complaint.  Will continue to monitor for progression.    Dr. Oneil Pinal is Medical Director for [facility] Card[facility].  Dr. Fuad Aleskerov is Medical Director for [facility] Pul[facility].    [1]  Social History Tobacco Use  Smoking Status Never  Smokeless Tobacco Never

## 2024-06-26 NOTE — Telephone Encounter (Signed)
 Was there anything else that she needed?

## 2024-06-27 ENCOUNTER — Encounter

## 2024-06-27 DIAGNOSIS — I214 Non-ST elevation (NSTEMI) myocardial infarction: Secondary | ICD-10-CM

## 2024-06-27 DIAGNOSIS — Z955 Presence of coronary angioplasty implant and graft: Secondary | ICD-10-CM

## 2024-06-27 NOTE — Progress Notes (Signed)
 Daily Session Note  Patient Details  Name: Tracey Wiley MRN: [id number] Date of Birth: [birth year] Referring Provider:   Flowsheet Row Cardiac Rehab from 01/31/2024 in [facility]  Referring Provider Dr. Evalene Lunger    Encounter Date: 06/27/2024  Check In:  Session Check In - 06/27/24 1404       Check-In   Supervising physician immediately available to respond to emergencies See telemetry face sheet for immediately available ER MD    Location [facility]    Staff Present Burnard Davenport RN,BSN,MPA;Laura Cates RN,BSN;Willian Donson Dyane BS, ACSM CEP, Exercise Physiologist;Noah Tickle, BS, Exercise Physiologist    Virtual Visit No    Medication changes reported     No    Fall or balance concerns reported    No    Tobacco Cessation No Change    Warm-up and Cool-down Performed on first and last piece of equipment    Resistance Training Performed Yes    VAD Patient? No    PAD/SET Patient? No      Pain Assessment   Currently in Pain? No/denies             Tobacco Use History[1]  Goals Met:  Independence with exercise equipment Exercise tolerated well No report of concerns or symptoms today Strength training completed today  Goals Unmet:  Not Applicable  Comments: Pt able to follow exercise prescription today without complaint.  Will continue to monitor for progression.    Dr. Oneil Pinal is Medical Director for [facility] Cardiac Rehabilitation.  Dr. Fuad Aleskerov is Medical Director for [facility] Rehabilitation.    [1]  Social History Tobacco Use  Smoking Status Never  Smokeless Tobacco Never

## 2024-06-28 ENCOUNTER — Ambulatory Visit: Admitting: Occupational Therapy

## 2024-06-28 ENCOUNTER — Ambulatory Visit (INDEPENDENT_AMBULATORY_CARE_PROVIDER_SITE_OTHER): Admitting: Clinical

## 2024-06-28 DIAGNOSIS — M6281 Muscle weakness (generalized): Secondary | ICD-10-CM

## 2024-06-28 DIAGNOSIS — M25649 Stiffness of unspecified hand, not elsewhere classified: Secondary | ICD-10-CM

## 2024-06-28 DIAGNOSIS — M150 Primary generalized (osteo)arthritis: Secondary | ICD-10-CM | POA: Diagnosis not present

## 2024-06-28 DIAGNOSIS — F4322 Adjustment disorder with anxiety: Secondary | ICD-10-CM | POA: Diagnosis not present

## 2024-06-28 DIAGNOSIS — M79651 Pain in right thigh: Secondary | ICD-10-CM
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
## 2024-07-02 ENCOUNTER — Encounter

## 2024-07-03 ENCOUNTER — Ambulatory Visit: Admitting: Occupational Therapy

## 2024-07-04 ENCOUNTER — Encounter

## 2024-07-05 ENCOUNTER — Ambulatory Visit: Admitting: Clinical

## 2024-07-05 DIAGNOSIS — F4322 Adjustment disorder with anxiety: Secondary | ICD-10-CM | POA: Diagnosis not present
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
## 2024-07-06 ENCOUNTER — Ambulatory Visit: Admitting: Occupational Therapy

## 2024-07-09 ENCOUNTER — Encounter

## 2024-07-09 ENCOUNTER — Ambulatory Visit: Admitting: Occupational Therapy

## 2024-07-11 ENCOUNTER — Encounter

## 2024-07-11 ENCOUNTER — Ambulatory Visit: Admitting: Clinical

## 2024-07-11 DIAGNOSIS — F4322 Adjustment disorder with anxiety: Secondary | ICD-10-CM
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
## 2024-07-12 ENCOUNTER — Ambulatory Visit: Admitting: Clinical

## 2024-07-12 ENCOUNTER — Ambulatory Visit: Admitting: Occupational Therapy

## 2024-07-16 ENCOUNTER — Encounter: Attending: Cardiovascular Disease

## 2024-07-16 ENCOUNTER — Ambulatory Visit: Admitting: Occupational Therapy

## 2024-07-18 ENCOUNTER — Encounter

## 2024-07-19 ENCOUNTER — Ambulatory Visit: Admitting: Occupational Therapy

## 2024-07-19 ENCOUNTER — Ambulatory Visit: Admitting: Clinical

## 2024-07-23 ENCOUNTER — Encounter

## 2024-07-23 ENCOUNTER — Ambulatory Visit: Admitting: Occupational Therapy

## 2024-07-25 ENCOUNTER — Encounter

## 2024-07-26 ENCOUNTER — Ambulatory Visit: Admitting: Clinical

## 2024-07-26 ENCOUNTER — Ambulatory Visit: Admitting: Occupational Therapy

## 2024-07-30 ENCOUNTER — Encounter

## 2024-07-31 ENCOUNTER — Ambulatory Visit: Admitting: Occupational Therapy

## 2024-08-01 ENCOUNTER — Encounter

## 2024-08-02 ENCOUNTER — Ambulatory Visit: Admitting: Clinical

## 2024-08-02 ENCOUNTER — Ambulatory Visit: Admitting: Occupational Therapy

## 2024-08-06 ENCOUNTER — Encounter: Attending: Cardiovascular Disease

## 2024-08-08 ENCOUNTER — Encounter

## 2024-08-09 ENCOUNTER — Ambulatory Visit: Admitting: Clinical

## 2024-08-23 ENCOUNTER — Ambulatory Visit: Admitting: Clinical

## 2024-08-28 ENCOUNTER — Ambulatory Visit (INDEPENDENT_AMBULATORY_CARE_PROVIDER_SITE_OTHER): Admitting: Nurse Practitioner

## 2024-08-30 ENCOUNTER — Ambulatory Visit: Admitting: Clinical

## 2024-09-14 ENCOUNTER — Ambulatory Visit: Admitting: Internal Medicine

## 2024-11-27 ENCOUNTER — Ambulatory Visit: Admitting: Cardiology

## 2025-04-09 ENCOUNTER — Ambulatory Visit
